# Patient Record
Sex: Male | Born: 1950 | Race: White | Hispanic: No | Smoking: Never smoker
Health system: Southern US, Community
[De-identification: ages and names within clinical notes are randomized; demographics above are authoritative.]

## PROBLEM LIST (undated history)

## (undated) DIAGNOSIS — Z9981 Dependence on supplemental oxygen: Secondary | ICD-10-CM

## (undated) DIAGNOSIS — I10 Essential (primary) hypertension: Secondary | ICD-10-CM

## (undated) DIAGNOSIS — I2510 Atherosclerotic heart disease of native coronary artery without angina pectoris: Secondary | ICD-10-CM

## (undated) DIAGNOSIS — E785 Hyperlipidemia, unspecified: Secondary | ICD-10-CM

## (undated) DIAGNOSIS — J42 Unspecified chronic bronchitis: Secondary | ICD-10-CM

## (undated) DIAGNOSIS — G4733 Obstructive sleep apnea (adult) (pediatric): Secondary | ICD-10-CM

## (undated) DIAGNOSIS — E669 Obesity, unspecified: Secondary | ICD-10-CM

## (undated) DIAGNOSIS — I214 Non-ST elevation (NSTEMI) myocardial infarction: Secondary | ICD-10-CM

## (undated) DIAGNOSIS — Z8739 Personal history of other diseases of the musculoskeletal system and connective tissue: Secondary | ICD-10-CM

## (undated) DIAGNOSIS — N189 Chronic kidney disease, unspecified: Secondary | ICD-10-CM

## (undated) DIAGNOSIS — L4050 Arthropathic psoriasis, unspecified: Secondary | ICD-10-CM

## (undated) DIAGNOSIS — M1990 Unspecified osteoarthritis, unspecified site: Secondary | ICD-10-CM

## (undated) DIAGNOSIS — E119 Type 2 diabetes mellitus without complications: Secondary | ICD-10-CM

## (undated) DIAGNOSIS — Z87442 Personal history of urinary calculi: Secondary | ICD-10-CM

## (undated) DIAGNOSIS — J45909 Unspecified asthma, uncomplicated: Secondary | ICD-10-CM

## (undated) DIAGNOSIS — Z9289 Personal history of other medical treatment: Secondary | ICD-10-CM

## (undated) DIAGNOSIS — Z9989 Dependence on other enabling machines and devices: Secondary | ICD-10-CM

## (undated) DIAGNOSIS — D649 Anemia, unspecified: Secondary | ICD-10-CM

## (undated) DIAGNOSIS — Z992 Dependence on renal dialysis: Secondary | ICD-10-CM

## (undated) DIAGNOSIS — N179 Acute kidney failure, unspecified: Secondary | ICD-10-CM

## (undated) HISTORY — DX: Hyperlipidemia, unspecified: E78.5

## (undated) HISTORY — DX: Essential (primary) hypertension: I10

## (undated) HISTORY — DX: Obesity, unspecified: E66.9

## (undated) HISTORY — DX: Atherosclerotic heart disease of native coronary artery without angina pectoris: I25.10

## (undated) HISTORY — PX: AV FISTULA PLACEMENT: SHX1204

## (undated) HISTORY — DX: Unspecified osteoarthritis, unspecified site: M19.90

## (undated) HISTORY — DX: Unspecified asthma, uncomplicated: J45.909

---

## 2000-01-21 ENCOUNTER — Encounter: Admission: RE | Admit: 2000-01-21 | Discharge: 2000-01-21 | Payer: Self-pay | Admitting: Orthopedic Surgery

## 2000-01-21 ENCOUNTER — Encounter: Payer: Self-pay | Admitting: Orthopedic Surgery

## 2004-06-24 ENCOUNTER — Ambulatory Visit: Payer: Self-pay | Admitting: Family Medicine

## 2004-09-09 ENCOUNTER — Ambulatory Visit: Payer: Self-pay | Admitting: Family Medicine

## 2004-11-15 ENCOUNTER — Ambulatory Visit: Payer: Self-pay | Admitting: Family Medicine

## 2004-11-22 ENCOUNTER — Ambulatory Visit: Payer: Self-pay | Admitting: Family Medicine

## 2005-01-25 ENCOUNTER — Ambulatory Visit: Payer: Self-pay | Admitting: Family Medicine

## 2006-04-17 ENCOUNTER — Ambulatory Visit: Payer: Self-pay | Admitting: Family Medicine

## 2006-06-18 ENCOUNTER — Ambulatory Visit: Payer: Self-pay | Admitting: Family Medicine

## 2006-07-19 ENCOUNTER — Ambulatory Visit: Payer: Self-pay | Admitting: Family Medicine

## 2006-07-19 LAB — CONVERTED CEMR LAB
Chol/HDL Ratio, serum: 3.9
Cholesterol: 128 mg/dL (ref 0–200)
Glucose, Bld: 181 mg/dL — ABNORMAL HIGH (ref 70–99)
HDL: 32.9 mg/dL — ABNORMAL LOW (ref >39.0)
Hgb A1c MFr Bld: 7 % — ABNORMAL HIGH (ref 4.6–6.0)
LDL Cholesterol: 70 mg/dL (ref 0–99)
Triglyceride fasting, serum: 126 mg/dL (ref 0–149)
VLDL: 25 mg/dL (ref 0–40)

## 2006-07-26 ENCOUNTER — Ambulatory Visit: Payer: Self-pay | Admitting: Family Medicine

## 2006-09-03 ENCOUNTER — Ambulatory Visit: Payer: Self-pay | Admitting: Family Medicine

## 2007-02-06 ENCOUNTER — Ambulatory Visit: Payer: Self-pay | Admitting: Family Medicine

## 2007-02-06 LAB — CONVERTED CEMR LAB
BUN: 23 mg/dL (ref 6–23)
CO2: 29 meq/L (ref 19–32)
Calcium: 9.6 mg/dL (ref 8.4–10.5)
Chloride: 107 meq/L (ref 96–112)
Creatinine, Ser: 1.2 mg/dL (ref 0.4–1.5)
GFR calc Af Amer: 81 mL/min
GFR calc non Af Amer: 67 mL/min
Glucose, Bld: 260 mg/dL — ABNORMAL HIGH (ref 70–99)
Hgb A1c MFr Bld: 8.2 % — ABNORMAL HIGH (ref 4.6–6.0)
Potassium: 6.2 meq/L (ref 3.5–5.1)
Sodium: 140 meq/L (ref 135–145)

## 2007-02-21 ENCOUNTER — Encounter: Payer: Self-pay | Admitting: Family Medicine

## 2007-02-21 DIAGNOSIS — I119 Hypertensive heart disease without heart failure: Secondary | ICD-10-CM | POA: Insufficient documentation

## 2007-02-21 DIAGNOSIS — K8020 Calculus of gallbladder without cholecystitis without obstruction: Secondary | ICD-10-CM | POA: Insufficient documentation

## 2007-02-21 DIAGNOSIS — I10 Essential (primary) hypertension: Secondary | ICD-10-CM

## 2007-02-21 DIAGNOSIS — Z87442 Personal history of urinary calculi: Secondary | ICD-10-CM | POA: Insufficient documentation

## 2007-05-07 ENCOUNTER — Ambulatory Visit: Payer: Self-pay | Admitting: Family Medicine

## 2007-05-07 LAB — CONVERTED CEMR LAB
BUN: 24 mg/dL — ABNORMAL HIGH (ref 6–23)
CO2: 30 meq/L (ref 19–32)
Calcium: 9.7 mg/dL (ref 8.4–10.5)
Chloride: 107 meq/L (ref 96–112)
Creatinine, Ser: 1.3 mg/dL (ref 0.4–1.5)
GFR calc Af Amer: 74 mL/min
GFR calc non Af Amer: 61 mL/min
Glucose, Bld: 273 mg/dL — ABNORMAL HIGH (ref 70–99)
Hgb A1c MFr Bld: 8.9 % — ABNORMAL HIGH (ref 4.6–6.0)
Potassium: 6.5 meq/L (ref 3.5–5.1)
Sodium: 141 meq/L (ref 135–145)

## 2007-05-09 ENCOUNTER — Telehealth: Payer: Self-pay | Admitting: Family Medicine

## 2007-05-14 ENCOUNTER — Ambulatory Visit: Payer: Self-pay | Admitting: Family Medicine

## 2007-05-14 ENCOUNTER — Telehealth: Payer: Self-pay | Admitting: Family Medicine

## 2007-05-14 LAB — CONVERTED CEMR LAB
BUN: 18 mg/dL (ref 6–23)
CO2: 28 meq/L (ref 19–32)
Calcium: 9.6 mg/dL (ref 8.4–10.5)
Chloride: 108 meq/L (ref 96–112)
Creatinine, Ser: 1.3 mg/dL (ref 0.4–1.5)
GFR calc Af Amer: 74 mL/min
GFR calc non Af Amer: 61 mL/min
Glucose, Bld: 245 mg/dL — ABNORMAL HIGH (ref 70–99)
Potassium: 5.9 meq/L — ABNORMAL HIGH (ref 3.5–5.1)
Sodium: 141 meq/L (ref 135–145)

## 2007-06-13 ENCOUNTER — Ambulatory Visit: Payer: Self-pay | Admitting: Family Medicine

## 2007-06-13 LAB — CONVERTED CEMR LAB
BUN: 21 mg/dL (ref 6–23)
CO2: 31 meq/L (ref 19–32)
Calcium: 9.5 mg/dL (ref 8.4–10.5)
Chloride: 105 meq/L (ref 96–112)
Creatinine, Ser: 1.1 mg/dL (ref 0.4–1.5)
GFR calc Af Amer: 90 mL/min
GFR calc non Af Amer: 74 mL/min
Glucose, Bld: 238 mg/dL — ABNORMAL HIGH (ref 70–99)
Potassium: 5.4 meq/L — ABNORMAL HIGH (ref 3.5–5.1)
Sodium: 141 meq/L (ref 135–145)

## 2007-07-16 ENCOUNTER — Ambulatory Visit: Payer: Self-pay | Admitting: Family Medicine

## 2007-07-16 LAB — CONVERTED CEMR LAB: Blood Glucose, Fingerstick: 185

## 2007-08-22 HISTORY — PX: CORONARY ANGIOPLASTY WITH STENT PLACEMENT: SHX49

## 2007-12-31 DIAGNOSIS — I2510 Atherosclerotic heart disease of native coronary artery without angina pectoris: Secondary | ICD-10-CM

## 2007-12-31 DIAGNOSIS — I259 Chronic ischemic heart disease, unspecified: Secondary | ICD-10-CM | POA: Insufficient documentation

## 2007-12-31 HISTORY — DX: Atherosclerotic heart disease of native coronary artery without angina pectoris: I25.10

## 2008-01-01 ENCOUNTER — Telehealth: Payer: Self-pay | Admitting: Family Medicine

## 2008-01-06 ENCOUNTER — Ambulatory Visit: Payer: Self-pay | Admitting: Family Medicine

## 2008-01-06 DIAGNOSIS — I2510 Atherosclerotic heart disease of native coronary artery without angina pectoris: Secondary | ICD-10-CM

## 2008-01-06 DIAGNOSIS — E785 Hyperlipidemia, unspecified: Secondary | ICD-10-CM | POA: Insufficient documentation

## 2008-01-06 LAB — CONVERTED CEMR LAB
BUN: 31 mg/dL — ABNORMAL HIGH (ref 6–23)
CO2: 29 meq/L (ref 19–32)
Calcium: 9.1 mg/dL (ref 8.4–10.5)
Chloride: 101 meq/L (ref 96–112)
Creatinine, Ser: 1.3 mg/dL (ref 0.4–1.5)
GFR calc Af Amer: 74 mL/min
GFR calc non Af Amer: 61 mL/min
Glucose, Bld: 317 mg/dL — ABNORMAL HIGH (ref 70–99)
Potassium: 4.6 meq/L (ref 3.5–5.1)
Sodium: 135 meq/L (ref 135–145)

## 2008-01-08 ENCOUNTER — Telehealth: Payer: Self-pay | Admitting: Family Medicine

## 2008-01-28 ENCOUNTER — Ambulatory Visit: Payer: Self-pay | Admitting: Family Medicine

## 2008-02-25 ENCOUNTER — Ambulatory Visit: Payer: Self-pay | Admitting: Family Medicine

## 2008-03-03 ENCOUNTER — Ambulatory Visit: Payer: Self-pay | Admitting: Family Medicine

## 2008-03-16 ENCOUNTER — Telehealth: Payer: Self-pay | Admitting: Family Medicine

## 2008-03-31 ENCOUNTER — Ambulatory Visit: Payer: Self-pay | Admitting: Family Medicine

## 2008-03-31 LAB — CONVERTED CEMR LAB
BUN: 23 mg/dL (ref 6–23)
CO2: 31 meq/L (ref 19–32)
Calcium: 9.7 mg/dL (ref 8.4–10.5)
Chloride: 100 meq/L (ref 96–112)
Creatinine, Ser: 1.2 mg/dL (ref 0.4–1.5)
Creatinine,U: 32.8 mg/dL
GFR calc Af Amer: 81 mL/min
GFR calc non Af Amer: 67 mL/min
Glucose, Bld: 237 mg/dL — ABNORMAL HIGH (ref 70–99)
Hgb A1c MFr Bld: 9.9 % — ABNORMAL HIGH (ref 4.6–6.0)
Microalb Creat Ratio: 146.3 mg/g — ABNORMAL HIGH (ref 0.0–30.0)
Microalb, Ur: 4.8 mg/dL — ABNORMAL HIGH (ref 0.0–1.9)
Potassium: 4.7 meq/L (ref 3.5–5.1)
Sodium: 138 meq/L (ref 135–145)

## 2008-04-28 ENCOUNTER — Ambulatory Visit: Payer: Self-pay | Admitting: Family Medicine

## 2008-05-27 ENCOUNTER — Telehealth: Payer: Self-pay | Admitting: *Deleted

## 2008-06-08 ENCOUNTER — Telehealth: Payer: Self-pay | Admitting: Family Medicine

## 2008-07-28 ENCOUNTER — Ambulatory Visit: Payer: Self-pay | Admitting: Family Medicine

## 2008-07-28 DIAGNOSIS — M1990 Unspecified osteoarthritis, unspecified site: Secondary | ICD-10-CM | POA: Insufficient documentation

## 2008-08-04 ENCOUNTER — Telehealth: Payer: Self-pay | Admitting: *Deleted

## 2008-08-05 LAB — CONVERTED CEMR LAB
BUN: 38 mg/dL — ABNORMAL HIGH (ref 6–23)
CO2: 32 meq/L (ref 19–32)
Calcium: 9.4 mg/dL (ref 8.4–10.5)
Chloride: 99 meq/L (ref 96–112)
Creatinine, Ser: 1.5 mg/dL (ref 0.4–1.5)
GFR calc Af Amer: 62 mL/min
GFR calc non Af Amer: 52 mL/min
Glucose, Bld: 232 mg/dL — ABNORMAL HIGH (ref 70–99)
Hgb A1c MFr Bld: 9.7 % — ABNORMAL HIGH (ref 4.6–6.0)
Potassium: 4.8 meq/L (ref 3.5–5.1)
Sodium: 138 meq/L (ref 135–145)

## 2008-10-08 ENCOUNTER — Ambulatory Visit: Payer: Self-pay | Admitting: Family Medicine

## 2008-10-08 DIAGNOSIS — J45901 Unspecified asthma with (acute) exacerbation: Secondary | ICD-10-CM | POA: Insufficient documentation

## 2008-10-12 ENCOUNTER — Telehealth: Payer: Self-pay | Admitting: Family Medicine

## 2008-12-10 ENCOUNTER — Ambulatory Visit: Payer: Self-pay | Admitting: Family Medicine

## 2008-12-31 ENCOUNTER — Ambulatory Visit: Payer: Self-pay | Admitting: Family Medicine

## 2008-12-31 DIAGNOSIS — R609 Edema, unspecified: Secondary | ICD-10-CM

## 2008-12-31 LAB — CONVERTED CEMR LAB
Cholesterol, target level: 200 mg/dL
HDL goal, serum: 40 mg/dL
LDL Goal: 70 mg/dL

## 2009-01-28 ENCOUNTER — Telehealth: Payer: Self-pay | Admitting: Family Medicine

## 2009-03-03 ENCOUNTER — Ambulatory Visit: Payer: Self-pay | Admitting: Family Medicine

## 2009-03-03 LAB — CONVERTED CEMR LAB
BUN: 36 mg/dL — ABNORMAL HIGH (ref 6–23)
CO2: 33 meq/L — ABNORMAL HIGH (ref 19–32)
Calcium: 9.3 mg/dL (ref 8.4–10.5)
Creatinine, Ser: 1.6 mg/dL — ABNORMAL HIGH (ref 0.4–1.5)
Glucose, Bld: 164 mg/dL — ABNORMAL HIGH (ref 70–99)

## 2009-03-04 ENCOUNTER — Telehealth: Payer: Self-pay | Admitting: Family Medicine

## 2009-03-16 ENCOUNTER — Encounter: Payer: Self-pay | Admitting: Family Medicine

## 2009-03-17 ENCOUNTER — Ambulatory Visit: Payer: Self-pay | Admitting: Family Medicine

## 2009-03-31 ENCOUNTER — Ambulatory Visit: Payer: Self-pay | Admitting: Family Medicine

## 2009-04-05 LAB — CONVERTED CEMR LAB
BUN: 57 mg/dL — ABNORMAL HIGH (ref 6–23)
Calcium: 9.3 mg/dL (ref 8.4–10.5)
GFR calc non Af Amer: 41.64 mL/min (ref >60)
Glucose, Bld: 161 mg/dL — ABNORMAL HIGH (ref 70–99)

## 2009-04-12 ENCOUNTER — Ambulatory Visit: Payer: Self-pay | Admitting: Family Medicine

## 2009-04-12 DIAGNOSIS — N184 Chronic kidney disease, stage 4 (severe): Secondary | ICD-10-CM

## 2009-04-12 LAB — CONVERTED CEMR LAB
CO2: 30 meq/L (ref 19–32)
Calcium: 9 mg/dL (ref 8.4–10.5)
Creatinine, Ser: 1.6 mg/dL — ABNORMAL HIGH (ref 0.4–1.5)

## 2009-04-15 ENCOUNTER — Telehealth: Payer: Self-pay | Admitting: Family Medicine

## 2009-05-26 ENCOUNTER — Ambulatory Visit: Payer: Self-pay | Admitting: Family Medicine

## 2009-05-26 LAB — CONVERTED CEMR LAB
BUN: 37 mg/dL — ABNORMAL HIGH (ref 6–23)
GFR calc non Af Amer: 51.36 mL/min (ref >60)
Hgb A1c MFr Bld: 6.9 % — ABNORMAL HIGH (ref 4.6–6.5)
Microalb, Ur: 0.9 mg/dL (ref 0.0–1.9)
Potassium: 6.1 meq/L (ref 3.5–5.1)
Sodium: 138 meq/L (ref 135–145)

## 2009-05-27 ENCOUNTER — Telehealth: Payer: Self-pay | Admitting: Family Medicine

## 2009-06-01 ENCOUNTER — Ambulatory Visit: Payer: Self-pay | Admitting: Family Medicine

## 2009-06-01 LAB — CONVERTED CEMR LAB
BUN: 37 mg/dL — ABNORMAL HIGH (ref 6–23)
CO2: 21 meq/L (ref 19–32)
Calcium: 9.3 mg/dL (ref 8.4–10.5)
Creatinine, Ser: 1.6 mg/dL — ABNORMAL HIGH (ref 0.4–1.5)

## 2009-06-04 ENCOUNTER — Telehealth: Payer: Self-pay | Admitting: Family Medicine

## 2009-06-16 ENCOUNTER — Telehealth: Payer: Self-pay | Admitting: Family Medicine

## 2009-06-21 ENCOUNTER — Ambulatory Visit: Payer: Self-pay | Admitting: Family Medicine

## 2009-06-23 LAB — CONVERTED CEMR LAB
BUN: 25 mg/dL — ABNORMAL HIGH (ref 6–23)
Calcium: 8.5 mg/dL (ref 8.4–10.5)
Creatinine, Ser: 1.4 mg/dL (ref 0.4–1.5)
GFR calc non Af Amer: 55.6 mL/min (ref >60)
Potassium: 4.8 meq/L (ref 3.5–5.1)

## 2009-07-02 ENCOUNTER — Telehealth: Payer: Self-pay | Admitting: Family Medicine

## 2009-07-19 ENCOUNTER — Ambulatory Visit: Payer: Self-pay | Admitting: Family Medicine

## 2009-07-20 DIAGNOSIS — M159 Polyosteoarthritis, unspecified: Secondary | ICD-10-CM | POA: Insufficient documentation

## 2009-07-29 ENCOUNTER — Telehealth: Payer: Self-pay | Admitting: Family Medicine

## 2009-08-16 ENCOUNTER — Telehealth: Payer: Self-pay | Admitting: Family Medicine

## 2009-08-19 ENCOUNTER — Telehealth: Payer: Self-pay | Admitting: Family Medicine

## 2009-08-19 ENCOUNTER — Ambulatory Visit: Payer: Self-pay | Admitting: Family Medicine

## 2009-08-30 ENCOUNTER — Encounter: Payer: Self-pay | Admitting: Family Medicine

## 2009-09-06 ENCOUNTER — Ambulatory Visit: Payer: Self-pay | Admitting: Family Medicine

## 2009-09-06 LAB — CONVERTED CEMR LAB
CO2: 24 meq/L (ref 19–32)
Calcium: 9.3 mg/dL (ref 8.4–10.5)
GFR calc non Af Amer: 147.7 mL/min (ref >60)
Hgb A1c MFr Bld: 6.5 % (ref 4.6–6.5)
Sodium: 144 meq/L (ref 135–145)

## 2009-09-07 ENCOUNTER — Telehealth: Payer: Self-pay | Admitting: Family Medicine

## 2009-09-28 ENCOUNTER — Telehealth: Payer: Self-pay | Admitting: Family Medicine

## 2009-10-11 ENCOUNTER — Encounter: Payer: Self-pay | Admitting: Family Medicine

## 2009-11-15 ENCOUNTER — Ambulatory Visit: Payer: Self-pay | Admitting: Family Medicine

## 2009-11-17 ENCOUNTER — Telehealth: Payer: Self-pay | Admitting: Family Medicine

## 2009-12-09 ENCOUNTER — Ambulatory Visit: Payer: Self-pay | Admitting: Family Medicine

## 2009-12-23 ENCOUNTER — Ambulatory Visit: Payer: Self-pay | Admitting: Family Medicine

## 2009-12-23 LAB — CONVERTED CEMR LAB
BUN: 36 mg/dL — ABNORMAL HIGH (ref 6–23)
CO2: 28 meq/L (ref 19–32)
Calcium: 9.3 mg/dL (ref 8.4–10.5)
Cholesterol: 311 mg/dL — ABNORMAL HIGH (ref 0–200)
GFR calc non Af Amer: 50.09 mL/min (ref >60)
Glucose, Bld: 291 mg/dL — ABNORMAL HIGH (ref 70–99)
Triglycerides: 784 mg/dL — ABNORMAL HIGH (ref 0.0–149.0)

## 2009-12-24 ENCOUNTER — Telehealth: Payer: Self-pay | Admitting: Family Medicine

## 2010-01-05 ENCOUNTER — Telehealth: Payer: Self-pay | Admitting: *Deleted

## 2010-01-06 ENCOUNTER — Ambulatory Visit: Payer: Self-pay | Admitting: Family Medicine

## 2010-01-06 DIAGNOSIS — M549 Dorsalgia, unspecified: Secondary | ICD-10-CM

## 2010-01-07 LAB — CONVERTED CEMR LAB
CO2: 28 meq/L (ref 19–32)
Calcium: 9.2 mg/dL (ref 8.4–10.5)
Chloride: 105 meq/L (ref 96–112)
Creatinine, Ser: 1.5 mg/dL (ref 0.4–1.5)
Glucose, Bld: 216 mg/dL — ABNORMAL HIGH (ref 70–99)

## 2010-01-14 ENCOUNTER — Ambulatory Visit: Payer: Self-pay | Admitting: Family Medicine

## 2010-01-14 DIAGNOSIS — L02419 Cutaneous abscess of limb, unspecified: Secondary | ICD-10-CM | POA: Insufficient documentation

## 2010-01-14 DIAGNOSIS — L03119 Cellulitis of unspecified part of limb: Secondary | ICD-10-CM

## 2010-01-20 ENCOUNTER — Telehealth: Payer: Self-pay | Admitting: *Deleted

## 2010-01-20 ENCOUNTER — Ambulatory Visit: Payer: Self-pay | Admitting: Family Medicine

## 2010-02-22 ENCOUNTER — Ambulatory Visit: Payer: Self-pay | Admitting: Family Medicine

## 2010-02-22 ENCOUNTER — Telehealth: Payer: Self-pay | Admitting: Family Medicine

## 2010-02-22 LAB — CONVERTED CEMR LAB
BUN: 66 mg/dL — ABNORMAL HIGH (ref 6–23)
Chloride: 107 meq/L (ref 96–112)
Creatinine, Ser: 2.4 mg/dL — ABNORMAL HIGH (ref 0.4–1.5)
GFR calc non Af Amer: 30.51 mL/min (ref >60)
Glucose, Bld: 165 mg/dL — ABNORMAL HIGH (ref 70–99)

## 2010-03-09 ENCOUNTER — Encounter: Payer: Self-pay | Admitting: Family Medicine

## 2010-04-18 ENCOUNTER — Ambulatory Visit: Payer: Self-pay | Admitting: Family Medicine

## 2010-04-18 DIAGNOSIS — M545 Low back pain: Secondary | ICD-10-CM

## 2010-04-18 LAB — CONVERTED CEMR LAB
Glucose, Urine, Semiquant: NEGATIVE
Nitrite: NEGATIVE
Protein, U semiquant: NEGATIVE
WBC Urine, dipstick: NEGATIVE
pH: 6

## 2010-04-20 ENCOUNTER — Telehealth: Payer: Self-pay | Admitting: Family Medicine

## 2010-04-29 ENCOUNTER — Telehealth: Payer: Self-pay | Admitting: Family Medicine

## 2010-05-05 ENCOUNTER — Ambulatory Visit: Payer: Self-pay | Admitting: Family Medicine

## 2010-05-17 ENCOUNTER — Ambulatory Visit: Payer: Self-pay | Admitting: Family Medicine

## 2010-05-18 ENCOUNTER — Telehealth: Payer: Self-pay | Admitting: Family Medicine

## 2010-05-18 LAB — CONVERTED CEMR LAB: Potassium: 5.2 meq/L — ABNORMAL HIGH (ref 3.5–5.1)

## 2010-06-06 ENCOUNTER — Telehealth: Payer: Self-pay | Admitting: Family Medicine

## 2010-07-05 ENCOUNTER — Ambulatory Visit: Payer: Self-pay | Admitting: Family Medicine

## 2010-07-05 DIAGNOSIS — M109 Gout, unspecified: Secondary | ICD-10-CM

## 2010-07-27 ENCOUNTER — Ambulatory Visit: Payer: Self-pay | Admitting: Family Medicine

## 2010-07-27 DIAGNOSIS — R319 Hematuria, unspecified: Secondary | ICD-10-CM

## 2010-07-27 LAB — CONVERTED CEMR LAB
BUN: 39 mg/dL — ABNORMAL HIGH (ref 6–23)
Creatinine, Ser: 1.6 mg/dL — ABNORMAL HIGH (ref 0.4–1.5)
GFR calc non Af Amer: 48.88 mL/min — ABNORMAL LOW (ref >60.00)
Hgb A1c MFr Bld: 7 % — ABNORMAL HIGH (ref 4.6–6.5)
Ketones, urine, test strip: NEGATIVE
Nitrite: NEGATIVE
Specific Gravity, Urine: 1.02
WBC Urine, dipstick: NEGATIVE
pH: 5

## 2010-07-28 ENCOUNTER — Telehealth: Payer: Self-pay | Admitting: Family Medicine

## 2010-08-03 ENCOUNTER — Ambulatory Visit: Payer: Self-pay | Admitting: Family Medicine

## 2010-08-16 ENCOUNTER — Ambulatory Visit
Admission: RE | Admit: 2010-08-16 | Discharge: 2010-08-16 | Payer: Self-pay | Source: Home / Self Care | Attending: Internal Medicine | Admitting: Internal Medicine

## 2010-08-16 DIAGNOSIS — J069 Acute upper respiratory infection, unspecified: Secondary | ICD-10-CM | POA: Insufficient documentation

## 2010-08-19 ENCOUNTER — Encounter: Payer: Self-pay | Admitting: Family Medicine

## 2010-09-18 LAB — CONVERTED CEMR LAB
ALT: 45 units/L (ref 0–53)
AST: 43 units/L — ABNORMAL HIGH (ref 0–37)
Albumin: 4.2 g/dL (ref 3.5–5.2)
Alkaline Phosphatase: 56 units/L (ref 39–117)
Basophils Absolute: 0 10*3/uL (ref 0.0–0.1)
Basophils Relative: 0.3 % (ref 0.0–3.0)
Blood in Urine, dipstick: NEGATIVE
Calcium: 9.1 mg/dL (ref 8.4–10.5)
Creatinine,U: 27.2 mg/dL
Direct LDL: 80.7 mg/dL
Eosinophils Relative: 1.8 % (ref 0.0–5.0)
GFR calc non Af Amer: 44 mL/min (ref >60)
Glucose, Bld: 129 mg/dL — ABNORMAL HIGH (ref 70–99)
HCT: 36.9 % — ABNORMAL LOW (ref 39.0–52.0)
HDL: 36.1 mg/dL — ABNORMAL LOW (ref >39.00)
Hemoglobin: 12.8 g/dL — ABNORMAL LOW (ref 13.0–17.0)
Ketones, urine, test strip: NEGATIVE
Lymphocytes Relative: 26.3 % (ref 12.0–46.0)
Lymphs Abs: 1.5 10*3/uL (ref 0.7–4.0)
Microalb Creat Ratio: 9.9 mg/g (ref 0.0–30.0)
Monocytes Relative: 7.2 % (ref 3.0–12.0)
Neutro Abs: 3.8 10*3/uL (ref 1.4–7.7)
Nitrite: NEGATIVE
Potassium: 6.1 meq/L (ref 3.5–5.1)
RBC: 4.05 M/uL — ABNORMAL LOW (ref 4.22–5.81)
RDW: 13 % (ref 11.5–14.6)
Sodium: 143 meq/L (ref 135–145)
Specific Gravity, Urine: 1.01
TSH: 2.1 microintl units/mL (ref 0.35–5.50)
Total CHOL/HDL Ratio: 4
Triglycerides: 269 mg/dL — ABNORMAL HIGH (ref 0.0–149.0)
Urobilinogen, UA: 0.2
VLDL: 53.8 mg/dL — ABNORMAL HIGH (ref 0.0–40.0)
WBC: 5.9 10*3/uL (ref 4.5–10.5)

## 2010-09-19 ENCOUNTER — Telehealth: Payer: Self-pay | Admitting: Family Medicine

## 2010-09-20 NOTE — Progress Notes (Signed)
Summary: novolin samples  Phone Note Outgoing Call   Summary of Call: samples of novolin are in office for patient to pick up Initial call taken by: Westley Hummer CMA Deborra Medina),  Jan 05, 2010 9:20 AM  Follow-up for Phone Call        Phone call completed Follow-up by: Westley Hummer CMA Deborra Medina),  Jan 05, 2010 9:20 AM

## 2010-09-20 NOTE — Assessment & Plan Note (Signed)
Summary: cough/chest congestion/cjr   Vital Signs:  Patient profile:   60 year old male Height:      68 inches Weight:      292 pounds BMI:     44.56 Temp:     98.1 degrees F oral BP sitting:   140 / 80  (left arm) Cuff size:   large  Vitals Entered By: Westley Hummer CMA Deborra Medina) (November 15, 2009 3:20 PM) CC: chest cough Is Patient Diabetic? Yes   CC:  chest cough.  History of Present Illness: Sovereign is a 60 year old single male, nonsmoker, with underlying type 1 diabetes, who comes in with a 10 day history of wheezing.  10 days ago he developed a viral syndrome with fever and chills.  That went away, but the wheezing that started about a week ago.  He's had a history of asthma in the past.  Blood sugar at home 120 to 130 and 100 units of Lantus daily, plus sliding scale regular  Allergies: No Known Drug Allergies  Past History:  Past medical, surgical, family and social histories (including risk factors) reviewed for relevance to current acute and chronic problems.  Past Medical History: Reviewed history from 10/08/2008 and no changes required. Hypertension OBESE Diabetes mellitus, type II Nephrolithiasis, hx of Coronary artery disease Dec 31, 2007 admitted to [facility] at stents placed in the circumflex and his LAD Hyperlipidemia Diabetes mellitus, type I Osteoarthritis Asthma  Past Surgical History: Reviewed history from 02/21/2007 and no changes required. Ashok Croon  Family History: Reviewed history from 01/06/2008 and no changes required. Family History Diabetes 1st degree relative Family History High cholesterol Family History Hypertension  Social History: Reviewed history from 05/26/2009 and no changes required. Single Never Smoked Alcohol use-no Regular exercise-no Retired..........disabled because of underlying hypertension, diabetes, renal insufficiency, and coronary artery disease  Review of Systems      See HPI  Physical  Exam  General:  Well-developed,well-nourished,in no acute distress; alert,appropriate and cooperative throughout examination Head:  Normocephalic and atraumatic without obvious abnormalities. No apparent alopecia or balding. Eyes:  No corneal or conjunctival inflammation noted. EOMI. Perrla. Funduscopic exam benign, without hemorrhages, exudates or papilledema. Vision grossly normal. Ears:  External ear exam shows no significant lesions or deformities.  Otoscopic examination reveals clear canals, tympanic membranes are intact bilaterally without bulging, retraction, inflammation or discharge. Hearing is grossly normal bilaterally. Nose:  External nasal examination shows no deformity or inflammation. Nasal mucosa are pink and moist without lesions or exudates. Mouth:  Oral mucosa and oropharynx without lesions or exudates.  Teeth in good repair. Neck:  No deformities, masses, or tenderness noted. Chest Wall:  No deformities, masses, tenderness or gynecomastia noted. Lungs:  inspiratory and expiratory wheezing   Impression & Recommendations:  Problem # 1:  ASTHMA (ICD-493.90) Assessment Deteriorated  His updated medication list for this problem includes:    Prednisone 20 Mg Tabs (Prednisone) ..... Uad  Orders: Prescription Created Electronically [phone])  Complete Medication List: 1)  Flexeril 10 Mg Tabs (Cyclobenzaprine hcl) 2)  Vicodin Es 7.5-750 Mg Tabs (Hydrocodone-acetaminophen) .... Take 1 tablet by mouth three times a day 3)  Glucotrol Xl 10 Mg Tb24 (Glipizide) .... Two times a day 4)  Glucophage 1000 Mg Tabs (Metformin hcl) .... Two times a day 5)  Simvastatin 40 Mg Tabs (Simvastatin) .... Once daily 6)  Adult Aspirin Ec Low Strength 81 Mg Tbec (Aspirin) .... Once daily 7)  Cetp Inhibitor  8)  Lantus 100 Unit/ml Soln (Insulin glargine) .Marland Kitchen.. 100  uqhs 9)  Bd Insulin Syringe Microfine 28g X 1/2" 1 Ml Misc (Insulin syringe-needle u-100) .... 65u at bedtime 10)  Furosemide 40 Mg  Tabs (Furosemide) .... Take one tab once daily 11)  Humalog 100 Unit/ml Soln (Insulin lispro (human)) .... 30 units once daily  using sliding scale 12)  Labetalol Hcl 300 Mg Tabs (Labetalol hcl) .... 2 by mouth two times a day 13)  Benicar Hct 40-12.5 Mg Tabs (Olmesartan medoxomil-hctz) .... Take one tab once daily 14)  Effient 10 Mg Tabs (Prasugrel hcl) .... Take one tab once daily 15)  Prednisone 20 Mg Tabs (Prednisone) .... Uad 16)  Hydromet 5-1.5 Mg/37ml Syrp (Hydrocodone-homatropine) .... 1/2 to 1 tsp at bedtime  Patient Instructions: 1)  drain to 30 ounces of water daily, begin prednisone two tabs x 3 days, one x 3 days, a half x 3 days, then half a tablet Monday, Wednesday, Friday, for a two week taper also, Hydromet one half to 1 teaspoon at bedtime as needed for nighttime cough Prescriptions: PREDNISONE 20 MG TABS (PREDNISONE) UAD  #30 x 1   Entered and Authorized by:   Dorena Cookey MD   Signed by:   Dorena Cookey MD on 11/15/2009   Method used:   Electronically to        [facility] (retail)       [place]  [address]       Ph: [phone]       Fax: [phone]   [id number] HYDROMET 5-1.5 MG/5ML SYRP (HYDROCODONE-HOMATROPINE) 1/2 to 1 tsp at bedtime  #4oz x 1   Entered and Authorized by:   Dorena Cookey MD   Signed by:   Dorena Cookey MD on 11/15/2009   Method used:   Print then Give to Patient   RxID:   [id number] PREDNISONE 20 MG TABS (PREDNISONE) UAD  #30 x 1   Entered and Authorized by:   Dorena Cookey MD   Signed by:   Dorena Cookey MD on 11/15/2009   Method used:   Electronically to        [facility] [place] (retail)       [address]       [place]  [address]       Ph: [phone]       Fax: [phone]   RxID:   [id number]

## 2010-09-20 NOTE — Assessment & Plan Note (Signed)
Summary: 1 mo rov/mm   Vital Signs:  Patient profile:   60 year old male Weight:      293 pounds Temp:     97.8 degrees F oral BP sitting:   124 / 84  (left arm) Cuff size:   regular  Vitals Entered By: Westley Hummer CMA Deborra Medina) (February 22, 2010 9:50 AM) CC: follow-up visit   CC:  follow-up visit.  History of Present Illness: Kent Osborne is a 60 year old type I diabetic who comes in today for follow-up and evaluation of an infected left great toenail.  His blood sugar is averaging about 100 fasting.  A1c, and kidney function today.  He recently had a cellulitis related to a bug bite, and it resolves with Keflex.  A couple days ago.  He was cutting his nails.  He trim them too short and then the next day noticed some redness in the medial side of his left great toenail.  It's been draining pus and he spent soaking it.  He had a refill on the Keflex and restarted that.  He states is not as red or sore as it was yesterday  Allergies: No Known Drug Allergies  Past History:  Past medical, surgical, family and social histories (including risk factors) reviewed for relevance to current acute and chronic problems.  Past Medical History: Reviewed history from 10/08/2008 and no changes required. Hypertension OBESE Diabetes mellitus, type II Nephrolithiasis, hx of Coronary artery disease Dec 31, 2007 admitted to [facility] at stents placed in the circumflex and his LAD Hyperlipidemia Diabetes mellitus, type I Osteoarthritis Asthma  Past Surgical History: Reviewed history from 02/21/2007 and no changes required. Ashok Croon  Family History: Reviewed history from 01/06/2008 and no changes required. Family History Diabetes 1st degree relative Family History High cholesterol Family History Hypertension  Social History: Reviewed history from 05/26/2009 and no changes required. Single Never Smoked Alcohol use-no Regular exercise-no Retired..........disabled because of  underlying hypertension, diabetes, renal insufficiency, and coronary artery disease  Review of Systems      See HPI  Physical Exam  General:  Well-developed,well-nourished,in no acute distress; alert,appropriate and cooperative throughout examination Skin:  the medial portion of the left great toenail shows some erythema.  No pus expressed.  There is some tenderness   Impression & Recommendations:  Problem # 1:  CELLULITIS, RIGHT LEG (ICD-682.6) Assessment Improved  His updated medication list for this problem includes:    Keflex 500 Mg Caps (Cephalexin) .Marland Kitchen... 2 by mouth two times a day  Problem # 2:  DIABETES MELLITUS, TYPE I (ICD-250.01) Assessment: Improved  His updated medication list for this problem includes:    Glucotrol Xl 10 Mg Tb24 (Glipizide) .Marland Kitchen..Marland Kitchen Two times a day    Glucophage 1000 Mg Tabs (Metformin hcl) .Marland Kitchen..Marland Kitchen Two times a day    Adult Aspirin Ec Low Strength 81 Mg Tbec (Aspirin) ..... Once daily    Lantus 100 Unit/ml Soln (Insulin glargine) .Marland KitchenMarland KitchenMarland KitchenMarland Kitchen 100  uqhs    Humalog 100 Unit/ml Soln (Insulin lispro (human)) .Marland KitchenMarland KitchenMarland KitchenMarland Kitchen 30 units once daily  using sliding scale    Benicar Hct 40-12.5 Mg Tabs (Olmesartan medoxomil-hctz) .Marland Kitchen... Take one tab once daily  Orders: Venipuncture [id number]) TLB-BMP (Basic Metabolic Panel-BMET) (99991111)  Complete Medication List: 1)  Flexeril 10 Mg Tabs (Cyclobenzaprine hcl) 2)  Glucotrol Xl 10 Mg Tb24 (Glipizide) .... Two times a day 3)  Glucophage 1000 Mg Tabs (Metformin hcl) .... Two times a day 4)  Simvastatin 40 Mg Tabs (Simvastatin) .... Once daily -  stopped- 5)  Adult Aspirin Ec Low Strength 81 Mg Tbec (Aspirin) .... Once daily 6)  Cetp Inhibitor  7)  Lantus 100 Unit/ml Soln (Insulin glargine) .Marland Kitchen.. 100  uqhs 8)  Bd Insulin Syringe Microfine 28g X 1/2" 1 Ml Misc (Insulin syringe-needle u-100) .... 65u at bedtime 9)  Furosemide 40 Mg Tabs (Furosemide) .... Take one tab once daily 10)  Humalog 100 Unit/ml Soln (Insulin lispro (human))  .... 30 units once daily  using sliding scale 11)  Labetalol Hcl 300 Mg Tabs (Labetalol hcl) .... 2 by mouth two times a day 12)  Benicar Hct 40-12.5 Mg Tabs (Olmesartan medoxomil-hctz) .... Take one tab once daily 13)  Effient 10 Mg Tabs (Prasugrel hcl) .... Take one tab once daily 14)  Vicodin Hp 10-660 Mg Tabs (Hydrocodone-acetaminophen) .... Take 1 tablet by mouth three times a day 15)  Keflex 500 Mg Caps (Cephalexin) .... 2 by mouth two times a day Prescriptions: KEFLEX 500 MG CAPS (CEPHALEXIN) 2 by mouth two times a day  #60 x 1   Entered and Authorized by:   Dorena Cookey MD   Signed by:   Dorena Cookey MD on 02/22/2010   Method used:   Print then Give to Patient   RxID:   [id number] VICODIN HP 10-660 MG TABS (HYDROCODONE-ACETAMINOPHEN) Take 1 tablet by mouth three times a day  #100 x 1   Entered and Authorized by:   Dorena Cookey MD   Signed by:   Dorena Cookey MD on 02/22/2010   Method used:   Print then Give to Patient   RxID:   [id number]

## 2010-09-20 NOTE — Progress Notes (Signed)
  Phone Note Outgoing Call   Summary of Call: significant improvement in GFR plus decrease of BUN and creatinine.  Continue current medications call in 3 weeks for BP.  Phone follow-up Initial call taken by: Dorena Cookey MD,  September 07, 2009 1:50 PM

## 2010-09-20 NOTE — Assessment & Plan Note (Signed)
Summary: 2 week fup//ccm   Vital Signs:  Patient profile:   60 year old male Weight:      296 pounds Temp:     98.4 degrees F oral BP sitting:   118 / 80  (left arm) Cuff size:   regular  Vitals Entered By: Westley Hummer CMA Deborra Medina) (January 20, 2010 9:06 AM) CC: follow-up visit   CC:  follow-up visit.  History of Present Illness: Kent Osborne is a 60 year old male, who comes in today for reevaluation of a cellulitis of his left posterior thigh.  We saw him a week ago with this problem.  We put him on hot soaks and Keflex 1 g b.i.d.  He comes back today for follow-up.  Allergies: No Known Drug Allergies  Review of Systems      See HPI  Physical Exam  General:  Well-developed,well-nourished,in no acute distress; alert,appropriate and cooperative throughout examination Skin:  the erythema has decreased however, there is an abscess deep within the muscle.   Impression & Recommendations:  Problem # 1:  CELLULITIS, RIGHT LEG (ICD-682.6) Assessment Unchanged  His updated medication list for this problem includes:    Keflex 500 Mg Caps (Cephalexin) .Marland Kitchen... 2 by mouth two times a day  Complete Medication List: 1)  Flexeril 10 Mg Tabs (Cyclobenzaprine hcl) 2)  Glucotrol Xl 10 Mg Tb24 (Glipizide) .... Two times a day 3)  Glucophage 1000 Mg Tabs (Metformin hcl) .... Two times a day 4)  Simvastatin 40 Mg Tabs (Simvastatin) .... Once daily - stopped- 5)  Adult Aspirin Ec Low Strength 81 Mg Tbec (Aspirin) .... Once daily 6)  Cetp Inhibitor  7)  Lantus 100 Unit/ml Soln (Insulin glargine) .Marland Kitchen.. 100  uqhs 8)  Bd Insulin Syringe Microfine 28g X 1/2" 1 Ml Misc (Insulin syringe-needle u-100) .... 65u at bedtime 9)  Furosemide 40 Mg Tabs (Furosemide) .... Take one tab once daily 10)  Humalog 100 Unit/ml Soln (Insulin lispro (human)) .... 30 units once daily  using sliding scale 11)  Labetalol Hcl 300 Mg Tabs (Labetalol hcl) .... 2 by mouth two times a day 12)  Benicar Hct 40-12.5 Mg Tabs  (Olmesartan medoxomil-hctz) .... Take one tab once daily 13)  Effient 10 Mg Tabs (Prasugrel hcl) .... Take one tab once daily 14)  Vicodin Hp 10-660 Mg Tabs (Hydrocodone-acetaminophen) .... Take 1 tablet by mouth three times a day 15)  Keflex 500 Mg Caps (Cephalexin) .... 2 by mouth two times a day  Patient Instructions: 1)  call your cardiologist, in [place], and have them recommend a general surgeon for you to see today

## 2010-09-20 NOTE — Progress Notes (Signed)
Summary: gout med  Phone Note Call from Patient Call back at Home Phone [phone]   Summary of Call: Please call me about med.  To ER, did labs, gout, took med 2 days a pil? every 2 hrs 6x a day,  went away, then came back, took rest of pills..Burning sensation toes right more but in both feet.  Swelling.  Warm to touch.  Lives [place], [place].  Hopes he doesn't have to come, says Dr. Darene Lamer sees him all the time.    Request callin med to [facility], in file.  NKDA.     Initial call taken by: Shelbie Hutching, RN,  June 06, 2010 9:54 AM  Follow-up for Phone Call        Apolonio Schneiders please call......Marland Kitchen we need to details of what happened, copy of all lab work, so we can make a decision Follow-up by: Dorena Cookey MD,  June 06, 2010 1:35 PM  Additional Follow-up for Phone Call Additional follow up Details #1::        spoke with patient and he will fax all papers Additional Follow-up by: Westley Hummer CMA Deborra Medina),  June 06, 2010 5:19 PM

## 2010-09-20 NOTE — Assessment & Plan Note (Signed)
Summary: ACUTE/BLOOD IN URINE/AS PER DR/RCD/a1c,bmp/njr   Vital Signs:  Patient profile:   60 year old male Height:      68 inches Weight:      308 pounds Temp:     99.2 degrees F oral Pulse rate:   75 / minute BP sitting:   152 / 84  (left arm) Cuff size:   regular  Vitals Entered By: Geanie Kenning LPN (December  7, 624THL 11:09 AM) CC: last night had blood in urine- no pain. Gave urine sample at lab this morning   CC:  last night had blood in urine- no pain. Gave urine sample at lab this morning.  History of Present Illness: Kent Osborne is a 60 y/o male with the sudden onset of hematuria yesterday.  He's had no fever, chills, back pain, or previous episodes of hematuria in the past.  He's currently taking two medications one is effient 10 mg daily and 6 aspirin tablets daily.  Review of systems otherwise negative  Current Medications (verified): 1)  Flexeril 10 Mg Tabs (Cyclobenzaprine Hcl) .... One Every 8 Hour As Needed 2)  Glucotrol Xl 10 Mg  Tb24 (Glipizide) .... Two Times A Day 3)  Glucophage 1000 Mg  Tabs (Metformin Hcl) .... Two Times A Day 4)  Simvastatin 40 Mg  Tabs (Simvastatin) .... Once Daily 5)  Adult Aspirin Ec Low Strength 81 Mg  Tbec (Aspirin) .... Once Daily 6)  Cetp Inhibitor 7)  Bd Insulin Syringe Microfine 28g X 1/2" 1 Ml Misc (Insulin Syringe-Needle U-100) .... 65u At Bedtime 8)  Furosemide 40 Mg Tabs (Furosemide) .... Take One Tab Every Other Day 9)  Humalog 100 Unit/ml Soln (Insulin Lispro (Human)) .Marland Kitchen.. 15 To 17 Units 3 Times A Day 10)  Labetalol Hcl 300 Mg Tabs (Labetalol Hcl) .... 2 By Mouth Two Times A Day 11)  Benicar Hct 40-12.5 Mg Tabs (Olmesartan Medoxomil-Hctz) .... Take 1/2 Tablet By Mouth in Themorning and 1/2 Tab in The Evening 12)  Effient 10 Mg Tabs (Prasugrel Hcl) .... Take One Tab Once Daily 13)  Vicodin Hp 10-660 Mg Tabs (Hydrocodone-Acetaminophen) .... Take 1 Tablet By Mouth Three Times A Day 14)  Lantus 100 Unit/ml Soln (Insulin Glargine) .... Use  100 Units At Bedtime 15)  Colcrys 0.6 Mg Tabs (Colchicine) .... Take 1 Tablet By Mouth Every Morning 16)  Allopurinol 300 Mg Tabs (Allopurinol) .... Take 1 Tablet By Mouth Every Morning  Allergies (verified): No Known Drug Allergies  Past History:  Past medical history reviewed for relevance to current acute and chronic problems. Past medical, surgical, family and social histories (including risk factors) reviewed for relevance to current acute and chronic problems.  Past Medical History: Reviewed history from 10/08/2008 and no changes required. Hypertension OBESE Diabetes mellitus, type II Nephrolithiasis, hx of Coronary artery disease Dec 31, 2007 admitted to [facility] at stents placed in the circumflex and his LAD Hyperlipidemia Diabetes mellitus, type I Osteoarthritis Asthma  Past Surgical History: Reviewed history from 02/21/2007 and no changes required. Ashok Croon  Family History: Reviewed history from 01/06/2008 and no changes required. Family History Diabetes 1st degree relative Family History High cholesterol Family History Hypertension  Social History: Reviewed history from 05/26/2009 and no changes required. Single Never Smoked Alcohol use-no Regular exercise-no Retired..........disabled because of underlying hypertension, diabetes, renal insufficiency, and coronary artery disease  Review of Systems      See HPI  Physical Exam  General:  Well-developed,well-nourished,in no acute distress; alert,appropriate and cooperative throughout examination  Problems:  Medical Problems Added: 1)  Dx of Hematuria  (ICD-599.70)  Impression & Recommendations:  Problem # 1:  HEMATURIA (ICD-599.70) Assessment New  Complete Medication List: 1)  Flexeril 10 Mg Tabs (Cyclobenzaprine hcl) .... One every 8 hour as needed 2)  Glucotrol Xl 10 Mg Tb24 (Glipizide) .... Two times a day 3)  Glucophage 1000 Mg Tabs (Metformin hcl) .... Two times a day 4)   Simvastatin 40 Mg Tabs (Simvastatin) .... Once daily 5)  Adult Aspirin Ec Low Strength 81 Mg Tbec (Aspirin) .... Once daily 6)  Cetp Inhibitor  7)  Bd Insulin Syringe Microfine 28g X 1/2" 1 Ml Misc (Insulin syringe-needle u-100) .... 65u at bedtime 8)  Furosemide 40 Mg Tabs (Furosemide) .... Take one tab every other day 9)  Humalog 100 Unit/ml Soln (Insulin lispro (human)) .Marland Kitchen.. 15 to 17 units 3 times a day 10)  Labetalol Hcl 300 Mg Tabs (Labetalol hcl) .... 2 by mouth two times a day 11)  Benicar Hct 40-12.5 Mg Tabs (Olmesartan medoxomil-hctz) .... Take 1/2 tablet by mouth in themorning and 1/2 tab in the evening 12)  Effient 10 Mg Tabs (Prasugrel hcl) .... Take one tab once daily 13)  Vicodin Hp 10-660 Mg Tabs (Hydrocodone-acetaminophen) .... Take 1 tablet by mouth three times a day 14)  Lantus 100 Unit/ml Soln (Insulin glargine) .... Use 100 units at bedtime 15)  Colcrys 0.6 Mg Tabs (Colchicine) .... Take 1 tablet by mouth every morning 16)  Allopurinol 300 Mg Tabs (Allopurinol) .... Take 1 tablet by mouth every morning  Other Orders: Venipuncture [id number]) TLB-BMP (Basic Metabolic Panel-BMET) (99991111) TLB-A1C / Hgb A1C (Glycohemoglobin) (83036-A1C) UA Dipstick w/o Micro (automated)  (81003) Specimen Handling (99000)  Patient Instructions: 1)  I would recommend he stop all aspirin and the effient .for 3 days and then restart the  medication except only take one aspirin tablet daily 2)  Discuss this with your cardiologist, in [place], today, to be sure it that their okay with this.   Orders Added: 1)  Venipuncture [id number] 2)  TLB-BMP (Basic Metabolic Panel-BMET) 123456 3)  TLB-A1C / Hgb A1C (Glycohemoglobin) [83036-A1C] 4)  UA Dipstick w/o Micro (automated)  [81003] 5)  Specimen Handling [99000] 6)  Est. Patient Level III [id number]    Laboratory Results   Urine Tests    Routine Urinalysis   Color: yellow Appearance: Clear Glucose: trace   (Normal Range:  Negative) Bilirubin: negative   (Normal Range: Negative) Ketone: negative   (Normal Range: Negative) Spec. Gravity: 1.020   (Normal Range: 1.003-1.035) Blood: 3+   (Normal Range: Negative) pH: 5.0   (Normal Range: 5.0-8.0) Protein: 2+   (Normal Range: Negative) Urobilinogen: 0.2   (Normal Range: 0-1) Nitrite: negative   (Normal Range: Negative) Leukocyte Esterace: negative   (Normal Range: Negative)    Comments: Joyce Gross  July 27, 2010 9:32 AM

## 2010-09-20 NOTE — Progress Notes (Signed)
Summary: Refill Lantus  Phone Note Refill Request Message from:  Pharmacy on April 20, 2010 9:51 AM  Refills Requested: Medication #1:  LANTUS SOLOSTAR 100 UNIT/ML SOLN 100 u at bedtime.   Dosage confirmed as above?Dosage Confirmed Initial call taken by: Clearnce Sorrel CMA,  April 20, 2010 9:51 AM    Prescriptions: LANTUS SOLOSTAR 100 UNIT/ML SOLN (INSULIN GLARGINE) 100 u at bedtime  #3 pens x 5   Entered by:   Clearnce Sorrel CMA   Authorized by:   Dorena Cookey MD   Signed by:   Clearnce Sorrel CMA on 04/20/2010   Method used:   Electronically to        [facility] # [id number]* ([place]ail[place]  [address]       Ph: [phone]       Fax: [phone]   RxID:   [id number]

## 2010-09-20 NOTE — Progress Notes (Signed)
Summary: latus   Phone Note Refill Request Message from:  Fax from Pharmacy on April 29, 2010 10:23 AM  Refills Requested: Medication #1:  LANTUS SOLOSTAR 100 UNIT/ML SOLN 100 u at bedtime. Initial call taken by: Westley Hummer CMA Deborra Medina),  April 29, 2010 10:23 AM  Follow-up for Phone Call        insurance does not cover the lantus pens  Follow-up by: Westley Hummer CMA Deborra Medina),  April 29, 2010 10:27 AM    New/Updated Medications: LANTUS 100 UNIT/ML SOLN (INSULIN GLARGINE) use 100 units at bedtime Prescriptions: LANTUS 100 UNIT/ML SOLN (INSULIN GLARGINE) use 100 units at bedtime  #30 day x 6   Entered by:   Westley Hummer CMA ([place])   Authorized by:   Dorena Cookey MD   Signed by:   Westley Hummer CMA ([place]) on 04/29/2010   Method used:   Electronically to        [facility] # [id number]* (retail)       [place]  [address]       Ph: [phone]       Fax: [phone]   RxID:   [id number]

## 2010-09-20 NOTE — Progress Notes (Signed)
  Phone Note Outgoing Call   Summary of Call: advise spill to hold the Lasix because his BUN is gone up to 66, creatinine 2.4, a month ago, was 36, with 1.2.  Then, take the Lasix every other day.  Drink lots of water avoid salt.  Follow-up as outlined Initial call taken by: Dorena Cookey MD,  February 22, 2010 5:14 PM

## 2010-09-20 NOTE — Assessment & Plan Note (Signed)
Summary: 2 month rov/njr   Vital Signs:  Patient profile:   60 year old male Weight:      294 pounds Temp:     98.5 degrees F oral BP sitting:   140 / 86  (left arm) Cuff size:   large  Vitals Entered By: Westley Hummer CMA Deborra Medina) (September 06, 2009 8:46 AM)  Reason for Visit 2 month follow up  History of Present Illness: Kent Osborne is a 60 year old male, single, nonsmoker, with underlying type 1 diabetes, hypertension, hyperlipidemia, coronary artery disease, status post MI with stents, who comes in today for follow-up.  He developed double vision in his left eye.  The ophthalmologist felt that it was most likely related to his hypertension.  With increased his medication however, his blood pressure is still elevated.  He states in the morning before he takes his medications if systolic will be between [id number] and 180.  He is taken Benicar one daily, along with labetalol 300 b.i.d., and Lasix 40 mg Monday, Wednesday, Friday.  He still has some peripheral edema, however, if we increase the Lasix to daily he develops marked renal insufficiency.  Last GFR two months ago, was 55 with a BUN of 25  a fasting blood sugar today was 103.  Last A1c was 6.9% in October  Allergies: No Known Drug Allergies  Past History:  Past medical, surgical, family and social histories (including risk factors) reviewed for relevance to current acute and chronic problems.  Past Medical History: Reviewed history from 10/08/2008 and no changes required. Hypertension OBESE Diabetes mellitus, type II Nephrolithiasis, hx of Coronary artery disease Dec 31, 2007 admitted to [facility] at stents placed in the circumflex and his LAD Hyperlipidemia Diabetes mellitus, type I Osteoarthritis Asthma  Past Surgical History: Reviewed history from 02/21/2007 and no changes required. Ashok Croon  Family History: Reviewed history from 01/06/2008 and no changes required. Family History Diabetes 1st degree  relative Family History High cholesterol Family History Hypertension  Social History: Reviewed history from 05/26/2009 and no changes required. Single Never Smoked Alcohol use-no Regular exercise-no Retired..........disabled because of underlying hypertension, diabetes, renal insufficiency, and coronary artery disease  Review of Systems      See HPI  Physical Exam  General:  Well-developed,well-nourished,in no acute distress; alert,appropriate and cooperative throughout examination Heart:  180/80   Impression & Recommendations:  Problem # 1:  HYPERTENSION (ICD-401.9) Assessment Deteriorated  His updated medication list for this problem includes:    Furosemide 40 Mg Tabs (Furosemide) .Marland Kitchen... Take one tab once daily    Labetalol Hcl 300 Mg Tabs (Labetalol hcl) .Marland Kitchen... 2 by mouth two times a day    Benicar 40 Mg Tabs (Olmesartan medoxomil) .Marland Kitchen... Take 1 tablet by mouth every morning  Orders: Venipuncture [id number]) TLB-BMP (Basic Metabolic Panel-BMET) (99991111) TLB-A1C / Hgb A1C (Glycohemoglobin) (83036-A1C) Prescription Created Electronically [phone])  Problem # 2:  DIABETES MELLITUS, TYPE II (ICD-250.00) Assessment: Improved  His updated medication list for this problem includes:    Glucotrol Xl 10 Mg Tb24 (Glipizide) .Marland Kitchen..Marland Kitchen Two times a day    Glucophage 1000 Mg Tabs (Metformin hcl) .Marland Kitchen..Marland Kitchen Two times a day    Adult Aspirin Ec Low Strength 81 Mg Tbec (Aspirin) ..... Once daily    Lantus 100 Unit/ml Soln (Insulin glargine) .Marland KitchenMarland KitchenMarland KitchenMarland Kitchen 100  uqhs    Humalog 100 Unit/ml Soln (Insulin lispro (human)) .Marland KitchenMarland KitchenMarland KitchenMarland Kitchen 30 units once daily  using sliding scale    Benicar 40 Mg Tabs (Olmesartan medoxomil) .Marland Kitchen... Take 1 tablet by mouth every  morning  Complete Medication List: 1)  Flexeril 10 Mg Tabs (Cyclobenzaprine hcl) 2)  Vicodin Es 7.5-750 Mg Tabs (Hydrocodone-acetaminophen) .... Take 1 tablet by mouth three times a day 3)  Glucotrol Xl 10 Mg Tb24 (Glipizide) .... Two times a day 4)  Glucophage  1000 Mg Tabs (Metformin hcl) .... Two times a day 5)  Simvastatin 40 Mg Tabs (Simvastatin) .... Once daily 6)  Adult Aspirin Ec Low Strength 81 Mg Tbec (Aspirin) .... Once daily 7)  Cetp Inhibitor  8)  Lantus 100 Unit/ml Soln (Insulin glargine) .Marland Kitchen.. 100  uqhs 9)  Bd Insulin Syringe Microfine 28g X 1/2" 1 Ml Misc (Insulin syringe-needle u-100) .... 65u at bedtime 10)  Furosemide 40 Mg Tabs (Furosemide) .... Take one tab once daily 11)  Humalog 100 Unit/ml Soln (Insulin lispro (human)) .... 30 units once daily  using sliding scale 12)  Labetalol Hcl 300 Mg Tabs (Labetalol hcl) .... 2 by mouth two times a day 13)  Benicar 40 Mg Tabs (Olmesartan medoxomil) .... Take 1 tablet by mouth every morning  Patient Instructions: 1)  increase the labetalol two 600 mg twice a day.  Continue the Benicar 40 mg daily in the morning check a morning blood pressure daily.  Call me in 3 weeks with the data and we will discuss any changes by telephone. 2)  Continue current treatment for your blood sugar.  We will call you picture A1c back Prescriptions: LABETALOL HCL 300 MG TABS (LABETALOL HCL) 2 by mouth two times a day  #270 x 3   Entered and Authorized by:   Dorena Cookey MD   Signed by:   Dorena Cookey MD on 09/06/2009   Method used:   Electronically to        [facility] (retail)       [place]  [address]       Ph: [phone]       Fax: [phone]   RxID:   [id number]

## 2010-09-20 NOTE — Progress Notes (Signed)
Summary: bp reading   Phone Note Call from Patient   Summary of Call: patient calling with blood pressure readings and to let you know that he is having a stress test tomorrow. monday - 192/68 p71, tueday 155/63 p74, wednesday 160/67 p73, thursday 142/62 p 72, friday 150/63 p 80, sat 182/69 p 70, monday 189/74 p70, tuesday 176/65 p 73, at 10 am 138/73.  patient states that his blood pressure does well until lunch time. Initial call taken by: Westley Hummer CMA Deborra Medina),  September 28, 2009 5:11 PM  Follow-up for Phone Call        asked the cardiologist it is doing your stress test for a second opinion on your blood pressure medication Follow-up by: Dorena Cookey MD,  September 28, 2009 5:14 PM  Additional Follow-up for Phone Call Additional follow up Details #1::        Phone Call Completed Additional Follow-up by: Westley Hummer CMA Deborra Medina),  September 28, 2009 5:16 PM

## 2010-09-20 NOTE — Progress Notes (Signed)
Summary: cough worse  Phone Note Call from Patient   Summary of Call: patient is now calling because he is now cough up green/yellow mucous should something be called in?   [facility] Initial call taken by: Westley Hummer CMA Deborra Medina),  November 17, 2009 2:20 PM  Follow-up for Phone Call        Apolonio Schneiders please call is no med.  Allergies....... Biaxin, 500 mg, dispensed 20, directions one p.o. b.i.d. no refills Follow-up by: Dorena Cookey MD,  November 18, 2009 11:05 AM  Additional Follow-up for Phone Call Additional follow up Details #1::        Phone Call Completed, Rx Called In Additional Follow-up by: Westley Hummer CMA Deborra Medina),  November 18, 2009 12:00 PM    New/Updated Medications: BIAXIN 500 MG TABS (CLARITHROMYCIN) take one tab by mouth two times a day Prescriptions: BIAXIN 500 MG TABS (CLARITHROMYCIN) take one tab by mouth two times a day  #20 x 0   Entered by:   Westley Hummer CMA ([place])   Authorized by:   Dorena Cookey MD   Signed by:   Westley Hummer CMA ([place]) on 11/18/2009   Method used:   Electronically to        [facility] [address] ([place]ail[place]  [address]       Ph: [phone]       Fax: [phone]   RxID:   [id number]

## 2010-09-20 NOTE — Assessment & Plan Note (Signed)
Summary: 1 month rov/njr   Vital Signs:  Patient profile:   60 year old male Weight:      290 pounds Temp:     98.2 degrees F oral BP sitting:   150 / 70  (left arm) Cuff size:   regular  Vitals Entered By: Westley Hummer CMA Deborra Medina) (Dec 23, 2009 9:40 AM) CC: follow-up visit   CC:  follow-up visit.  History of Present Illness: Kent Osborne is a 60 year old male, nonsmoker, who has underlying diabetes, chronic renal insufficiency, coronary disease, hypertension, who comes in today for reevaluation of the above problems.  as  noticed in his previous note.  He had an emergency room visit because of chest pain.  At that time.  His blood pressure was markedly elevated.  We restarted his Lasix 40 mg Monday, Wednesday, Friday.  He diuresed 9 pounds.  He still has peripheral edema, but no shortness of breath, and blood pressure now is 150/70.  Right arm sitting position.  His cardiologist, has stopped his simvastatin one month ago, and advised to get a follow-up lipid panel, which we will do today.  He continues to do well managing his blood sugar with the combination of Glucotrol 10 mg b.i.d., Glucophage 1000 mg b.i.d., and Lantus 100 units nightly.    Allergies: No Known Drug Allergies  Past History:  Past medical, surgical, family and social histories (including risk factors) reviewed for relevance to current acute and chronic problems.  Past Medical History: Reviewed history from 10/08/2008 and no changes required. Hypertension OBESE Diabetes mellitus, type II Nephrolithiasis, hx of Coronary artery disease Dec 31, 2007 admitted to [facility] at stents placed in the circumflex and his LAD Hyperlipidemia Diabetes mellitus, type I Osteoarthritis Asthma  Past Surgical History: Reviewed history from 02/21/2007 and no changes required. Ashok Croon  Family History: Reviewed history from 01/06/2008 and no changes required. Family History Diabetes 1st degree  relative Family History High cholesterol Family History Hypertension  Social History: Reviewed history from 05/26/2009 and no changes required. Single Never Smoked Alcohol use-no Regular exercise-no Retired..........disabled because of underlying hypertension, diabetes, renal insufficiency, and coronary artery disease  Review of Systems      See HPI  Physical Exam  General:  Well-developed,well-nourished,in no acute distress; alert,appropriate and cooperative throughout examination Heart:  150/70 right arm sitting position Extremities:  2+ left pedal edema and 2+ right pedal edema.     Complete Medication List: 1)  Flexeril 10 Mg Tabs (Cyclobenzaprine hcl) 2)  Vicodin Es 7.5-750 Mg Tabs (Hydrocodone-acetaminophen) .... Take 1 tablet by mouth three times a day 3)  Glucotrol Xl 10 Mg Tb24 (Glipizide) .... Two times a day 4)  Glucophage 1000 Mg Tabs (Metformin hcl) .... Two times a day 5)  Simvastatin 40 Mg Tabs (Simvastatin) .... Once daily - stopped- 6)  Adult Aspirin Ec Low Strength 81 Mg Tbec (Aspirin) .... Once daily 7)  Cetp Inhibitor  8)  Lantus 100 Unit/ml Soln (Insulin glargine) .Marland Kitchen.. 100  uqhs 9)  Bd Insulin Syringe Microfine 28g X 1/2" 1 Ml Misc (Insulin syringe-needle u-100) .... 65u at bedtime 10)  Furosemide 40 Mg Tabs (Furosemide) .... Take one tab once daily 11)  Humalog 100 Unit/ml Soln (Insulin lispro (human)) .... 30 units once daily  using sliding scale 12)  Labetalol Hcl 300 Mg Tabs (Labetalol hcl) .... 2 by mouth two times a day 13)  Benicar Hct 40-12.5 Mg Tabs (Olmesartan medoxomil-hctz) .... Take one tab once daily 14)  Effient 10 Mg  Tabs (Prasugrel hcl) .... Take one tab once daily 15)  Prednisone 20 Mg Tabs (Prednisone) .... Uad 16)  Hydromet 5-1.5 Mg/34ml Syrp (Hydrocodone-homatropine) .... 1/2 to 1 tsp at bedtime 17)  Biaxin 500 Mg Tabs (Clarithromycin) .... Take one tab by mouth two times a day  Other Orders: Venipuncture [id number]) TLB-Lipid Panel  (80061-LIPID) TLB-BMP (Basic Metabolic Panel-BMET) (99991111) TLB-A1C / Hgb A1C (Glycohemoglobin) (83036-A1C)  Patient Instructions: 1)  take a 40-mg Lasix tablet every other day. 2)  Continue your other medications. 3)  be  meticulous about no salt. 4)  I will call  your lab reports

## 2010-09-20 NOTE — Assessment & Plan Note (Signed)
Summary: lft foot pain/cjr   Vital Signs:  Patient profile:   60 year old male Weight:      305 pounds Temp:     97.7 degrees F oral BP sitting:   140 / 80  (left arm) Cuff size:   regular  Vitals Entered By: Westley Hummer CMA Deborra Medina) (July 05, 2010 8:47 AM) CC: left foot pain   CC:  left foot pain.  History of Present Illness: Kent Osborne is a 60 year old male, who comes in today for evaluation of gout.  He said a history of repeated gout attacks.  I  the last attack was two months ago.  Two days ago he began noticing some swelling of the dorsum of his left foot is decreased today.  He recently saw his nephrologist in [place], who recommended he continue his current treatment program except lose weight  Allergies: No Known Drug Allergies  Physical Exam  General:  Well-developed,well-nourished,in no acute distress; alert,appropriate and cooperative throughout examination Msk:  slightly red, slightly tender, dorsum, left foot, great toe   Impression & Recommendations:  Problem # 1:  GOUT, UNSPECIFIED (ICD-274.9) Assessment New  His updated medication list for this problem includes:    Colcrys 0.6 Mg Tabs (Colchicine) .Marland Kitchen... Take 1 tablet by mouth every morning    Allopurinol 300 Mg Tabs (Allopurinol) .Marland Kitchen... Take 1 tablet by mouth every morning  Complete Medication List: 1)  Flexeril 10 Mg Tabs (Cyclobenzaprine hcl) .... One every 8 hour as needed 2)  Glucotrol Xl 10 Mg Tb24 (Glipizide) .... Two times a day 3)  Glucophage 1000 Mg Tabs (Metformin hcl) .... Two times a day 4)  Simvastatin 40 Mg Tabs (Simvastatin) .... Once daily 5)  Adult Aspirin Ec Low Strength 81 Mg Tbec (Aspirin) .... Once daily 6)  Cetp Inhibitor  7)  Bd Insulin Syringe Microfine 28g X 1/2" 1 Ml Misc (Insulin syringe-needle u-100) .... 65u at bedtime 8)  Furosemide 40 Mg Tabs (Furosemide) .... Take one tab every other day 9)  Humalog 100 Unit/ml Soln (Insulin lispro (human)) .Marland Kitchen.. 15 to 17 units 3 times a  day 10)  Labetalol Hcl 300 Mg Tabs (Labetalol hcl) .... 2 by mouth two times a day 11)  Benicar Hct 40-12.5 Mg Tabs (Olmesartan medoxomil-hctz) .... Take one tab once daily 12)  Effient 10 Mg Tabs (Prasugrel hcl) .... Take one tab once daily 13)  Vicodin Hp 10-660 Mg Tabs (Hydrocodone-acetaminophen) .... Take 1 tablet by mouth three times a day 14)  Lantus 100 Unit/ml Soln (Insulin glargine) .... Use 100 units at bedtime 15)  Colcrys 0.6 Mg Tabs (Colchicine) .... Take 1 tablet by mouth every morning 16)  Allopurinol 300 Mg Tabs (Allopurinol) .... Take 1 tablet by mouth every morning  Patient Instructions: 1)   take the  colcryx one daily, x 7 days.  Also, today, start allopurinol 300 mg nightly daily Prescriptions: ALLOPURINOL 300 MG TABS (ALLOPURINOL) Take 1 tablet by mouth every morning  #100 x 3   Entered and Authorized by:   Dorena Cookey MD   Signed by:   Dorena Cookey MD on 07/05/2010   Method used:   Print then Give to Patient   RxID:   [id number] COLCRYS 0.6 MG TABS (COLCHICINE) Take 1 tablet by mouth every morning  #7 x 2   Entered and Authorized by:   Dorena Cookey MD   Signed by:   Dorena Cookey MD on 07/05/2010   Method used:   Print then  Give to Patient   RxID:   [id number]    Orders Added: 1)  Est. Patient Level III [id number]

## 2010-09-20 NOTE — Assessment & Plan Note (Signed)
Summary: MED CK (REFILLS) // RS   Vital Signs:  Patient profile:   60 year old male Weight:      292 pounds Temp:     98.0 degrees F oral Resp:     12 per minute BP sitting:   190 / 100  (left arm) Cuff size:   regular  Vitals Entered By: Westley Hummer CMA Deborra Medina) (December 09, 2009 9:58 AM) CC: follow-up visit   CC:  follow-up visit.  History of Present Illness: Kent Osborne is a 60 year old single male, nonsmoker, who comes in today for evaluation of hypertension, diabetes, coronary disease, renal insufficiency, and peripheral edema.  Last week.  He went out to play golf and after golf.  He went to a [employer].  He ate a lot of salt and later on that evening, developed chest pain.  He says it was a two on a scale of one to 10, but was in the middle of his chest and he felt in his hand.  He had no shortness of breath nor diaphoresis.  Because he said previous heart attack and stents.  He went to the emergency room.  Cardiac, pulmonary, workup are negative.  They told him his creatinine was marginally elevated 1.6.  This is been present before.  He has a history of renal insufficiency from his diabetes.  He takes Lasix 40 mg Monday, Wednesday, Friday.  If he takes it every day.  He goes into renal failure.  His fasting blood sugars on insulin average around 110, 120 in the morning.  He is compliant with his other medications.  He was recently notified.  They did does qualify for disability  Allergies: No Known Drug Allergies  Past History:  Past medical, surgical, family and social histories (including risk factors) reviewed for relevance to current acute and chronic problems.  Past Medical History: Reviewed history from 10/08/2008 and no changes required. Hypertension OBESE Diabetes mellitus, type II Nephrolithiasis, hx of Coronary artery disease Dec 31, 2007 admitted to [facility] at stents placed in the circumflex and his LAD Hyperlipidemia Diabetes mellitus, type  I Osteoarthritis Asthma  Past Surgical History: Reviewed history from 02/21/2007 and no changes required. Ashok Croon  Family History: Reviewed history from 01/06/2008 and no changes required. Family History Diabetes 1st degree relative Family History High cholesterol Family History Hypertension  Social History: Reviewed history from 05/26/2009 and no changes required. Single Never Smoked Alcohol use-no Regular exercise-no Retired..........disabled because of underlying hypertension, diabetes, renal insufficiency, and coronary artery disease  Review of Systems      See HPI  Physical Exam  General:  Well-developed,well-nourished,in no acute distress; alert,appropriate and cooperative throughout examination Chest Wall:  No deformities, masses, tenderness or gynecomastia noted. Lungs:  Normal respiratory effort, chest expands symmetrically. Lungs are clear to auscultation, no crackles or wheezes. Heart:  Normal rate and regular rhythm. S1 and S2 normal without gallop, murmur, click, rub or other extra sounds. Extremities:  2+ left pedal edema and 2+ right pedal edema.     Impression & Recommendations:  Problem # 1:  HYPERTENSION (ICD-401.9) Assessment Deteriorated  His updated medication list for this problem includes:    Furosemide 40 Mg Tabs (Furosemide) .Marland Kitchen... Take one tab once daily    Labetalol Hcl 300 Mg Tabs (Labetalol hcl) .Marland Kitchen... 2 by mouth two times a day    Benicar Hct 40-12.5 Mg Tabs (Olmesartan medoxomil-hctz) .Marland Kitchen... Take one tab once daily  Problem # 2:  DIABETES MELLITUS, TYPE I (ICD-250.01) Assessment: Improved  His updated medication list for this problem includes:    Glucotrol Xl 10 Mg Tb24 (Glipizide) .Marland Kitchen..Marland Kitchen Two times a day    Glucophage 1000 Mg Tabs (Metformin hcl) .Marland Kitchen..Marland Kitchen Two times a day    Adult Aspirin Ec Low Strength 81 Mg Tbec (Aspirin) ..... Once daily    Lantus 100 Unit/ml Soln (Insulin glargine) .Marland KitchenMarland KitchenMarland KitchenMarland Kitchen 100  uqhs    Humalog 100 Unit/ml Soln  (Insulin lispro (human)) .Marland KitchenMarland KitchenMarland KitchenMarland Kitchen 30 units once daily  using sliding scale    Benicar Hct 40-12.5 Mg Tabs (Olmesartan medoxomil-hctz) .Marland Kitchen... Take one tab once daily  Complete Medication List: 1)  Flexeril 10 Mg Tabs (Cyclobenzaprine hcl) 2)  Vicodin Es 7.5-750 Mg Tabs (Hydrocodone-acetaminophen) .... Take 1 tablet by mouth three times a day 3)  Glucotrol Xl 10 Mg Tb24 (Glipizide) .... Two times a day 4)  Glucophage 1000 Mg Tabs (Metformin hcl) .... Two times a day 5)  Simvastatin 40 Mg Tabs (Simvastatin) .... Once daily 6)  Adult Aspirin Ec Low Strength 81 Mg Tbec (Aspirin) .... Once daily 7)  Cetp Inhibitor  8)  Lantus 100 Unit/ml Soln (Insulin glargine) .Marland Kitchen.. 100  uqhs 9)  Bd Insulin Syringe Microfine 28g X 1/2" 1 Ml Misc (Insulin syringe-needle u-100) .... 65u at bedtime 10)  Furosemide 40 Mg Tabs (Furosemide) .... Take one tab once daily 11)  Humalog 100 Unit/ml Soln (Insulin lispro (human)) .... 30 units once daily  using sliding scale 12)  Labetalol Hcl 300 Mg Tabs (Labetalol hcl) .... 2 by mouth two times a day 13)  Benicar Hct 40-12.5 Mg Tabs (Olmesartan medoxomil-hctz) .... Take one tab once daily 14)  Effient 10 Mg Tabs (Prasugrel hcl) .... Take one tab once daily 15)  Prednisone 20 Mg Tabs (Prednisone) .... Uad 16)  Hydromet 5-1.5 Mg/36ml Syrp (Hydrocodone-homatropine) .... 1/2 to 1 tsp at bedtime 17)  Biaxin 500 Mg Tabs (Clarithromycin) .... Take one tab by mouth two times a day  Patient Instructions: 1)  stay and a complete salt free diet and take 40 mg Lasix daily for the next 5 days, then go back to normal schedule of one tablet on Monday, Wednesday, Friday.  Also, it's helpful to keep her legs elevated as much as possible.  Check a blood pressure in the morning daily.  Return in two weeks for follow-up

## 2010-09-20 NOTE — Assessment & Plan Note (Signed)
Summary: ?kidneystones/njr   Vital Signs:  Patient profile:   60 year old male Temp:     98.4 degrees F oral BP sitting:   130 / 90  (left arm) Cuff size:   regular  Vitals Entered By: Westley Hummer CMA Deborra Medina) (April 18, 2010 12:06 PM) CC: lower left back pain   CC:  lower left back pain.  History of Present Illness: Kent Osborne is a 60 year old male, nonsmoker, type I diabetic, who comes in today for evaluation of low back pain.  He said a history of recurrent low back pain.  Last episode this severe was about a year ago.  This was triggered by playing golf last Saturday.  He was able to play two holes because he developed the sudden onset of severe pain.  He's been at bed rest at home, but it hasn't helped.  He feels better when he lies flat.  The pain diminishes, but does not go completely away.  He describes the pain is located to the left lumbar area.  It sharp, a 10 on a scale of one to 10 however, does not radiate below his waist.  Neurologic review of systems negative  Allergies: No Known Drug Allergies  Past History:  Past medical, surgical, family and social histories (including risk factors) reviewed for relevance to current acute and chronic problems.  Past Medical History: Reviewed history from 10/08/2008 and no changes required. Hypertension OBESE Diabetes mellitus, type II Nephrolithiasis, hx of Coronary artery disease Dec 31, 2007 admitted to [facility] at stents placed in the circumflex and his LAD Hyperlipidemia Diabetes mellitus, type I Osteoarthritis Asthma  Past Surgical History: Reviewed history from 02/21/2007 and no changes required. Ashok Croon  Family History: Reviewed history from 01/06/2008 and no changes required. Family History Diabetes 1st degree relative Family History High cholesterol Family History Hypertension  Social History: Reviewed history from 05/26/2009 and no changes required. Single Never Smoked Alcohol  use-no Regular exercise-no Retired..........disabled because of underlying hypertension, diabetes, renal insufficiency, and coronary artery disease  Review of Systems      See HPI  Physical Exam  General:  Well-developed,well-nourished,in no acute distress; alert,appropriate and cooperative throughout examination Msk:  No deformity or scoliosis noted of thoracic or lumbar spine.   Pulses:  R and L carotid,radial,femoral,dorsalis pedis and posterior tibial pulses are full and equal bilaterally Extremities:  No clubbing, cyanosis, edema, or deformity noted with normal full range of motion of all joints.   Neurologic:  No cranial nerve deficits noted. Station and gait are normal. Plantar reflexes are down-going bilaterally. DTRs are symmetrical throughout. Sensory, motor and coordinative functions appear intact.   Impression & Recommendations:  Problem # 1:  BACK PAIN, LUMBAR (ICD-724.2) Assessment Deteriorated  His updated medication list for this problem includes:    Flexeril 10 Mg Tabs (Cyclobenzaprine hcl)    Adult Aspirin Ec Low Strength 81 Mg Tbec (Aspirin) ..... Once daily    Vicodin Hp 10-660 Mg Tabs (Hydrocodone-acetaminophen) .Marland Kitchen... Take 1 tablet by mouth three times a day  Complete Medication List: 1)  Flexeril 10 Mg Tabs (Cyclobenzaprine hcl) 2)  Glucotrol Xl 10 Mg Tb24 (Glipizide) .... Two times a day 3)  Glucophage 1000 Mg Tabs (Metformin hcl) .... Two times a day 4)  Simvastatin 40 Mg Tabs (Simvastatin) .... Once daily - stopped- 5)  Adult Aspirin Ec Low Strength 81 Mg Tbec (Aspirin) .... Once daily 6)  Cetp Inhibitor  7)  Bd Insulin Syringe Microfine 28g X 1/2" 1 Ml Misc (  Insulin syringe-needle u-100) .... 65u at bedtime 8)  Furosemide 40 Mg Tabs (Furosemide) .... Take one tab once daily 9)  Humalog 100 Unit/ml Soln (Insulin lispro (human)) .... 30 units once daily  using sliding scale 10)  Labetalol Hcl 300 Mg Tabs (Labetalol hcl) .... 2 by mouth two times a day 11)   Benicar Hct 40-12.5 Mg Tabs (Olmesartan medoxomil-hctz) .... Take one tab once daily 12)  Effient 10 Mg Tabs (Prasugrel hcl) .... Take one tab once daily 13)  Vicodin Hp 10-660 Mg Tabs (Hydrocodone-acetaminophen) .... Take 1 tablet by mouth three times a day 14)  Lantus Solostar 100 Unit/ml Soln (Insulin glargine) .Marland Kitchen.. 100 u at bedtime  Other Orders: UA Dipstick w/o Micro (manual) [id number])  Patient Instructions: 1)  stay at complete bed rest today, tomorrow, and possibly Wednesday.  Take the Flexeril and Vicodin 3 to 4 times daily.  After Wednesday, walk........... lie down.............. walk........... lie down.  Another option is to get to set up to see a physical therapist.  If you would like to do that we can arrange for that to be done in [place] where you live Prescriptions: LANTUS SOLOSTAR 100 UNIT/ML SOLN (INSULIN GLARGINE) 100 u at bedtime  #3 pens x 5   Entered and Authorized by:   Dorena Cookey MD   Signed by:   Dorena Cookey MD on 04/18/2010   Method used:   Print then Give to Patient   RxID:   [id number] HUMALOG 100 UNIT/ML SOLN (INSULIN LISPRO (HUMAN)) 30 units once daily  using sliding scale  #3 vials x 4   Entered and Authorized by:   Dorena Cookey MD   Signed by:   Dorena Cookey MD on 04/18/2010   Method used:   Print then Give to Patient   RxID:   [id number] LANTUS 100 UNIT/ML  SOLN (INSULIN GLARGINE) 100  uqhs Brand medically necessary #3 vials x 4   Entered and Authorized by:   Dorena Cookey MD   Signed by:   Dorena Cookey MD on 04/18/2010   Method used:   Print then Give to Patient   RxID:   [id number] VICODIN HP 10-660 MG TABS (HYDROCODONE-ACETAMINOPHEN) Take 1 tablet by mouth three times a day  #50 x 1   Entered and Authorized by:   Dorena Cookey MD   Signed by:   Dorena Cookey MD on 04/18/2010   Method used:   Print then Give to Patient   RxID:   [id number] FLEXERIL 10 MG TABS (CYCLOBENZAPRINE HCL)   #50 x 1   Entered and  Authorized by:   Dorena Cookey MD   Signed by:   Dorena Cookey MD on 04/18/2010   Method used:   Print then Give to Patient   RxID:   [id number]   Laboratory Results   Urine Tests  Date/Time Received: April 18, 2010   Routine Urinalysis   Color: yellow Appearance: Clear Glucose: negative   (Normal Range: Negative) Bilirubin: negative   (Normal Range: Negative) Ketone: negative   (Normal Range: Negative) Spec. Gravity: 1.020   (Normal Range: 1.003-1.035) Blood: negative   (Normal Range: Negative) pH: 6.0   (Normal Range: 5.0-8.0) Protein: negative   (Normal Range: Negative) Urobilinogen: 0.2   (Normal Range: 0-1) Nitrite: negative   (Normal Range: Negative) Leukocyte Esterace: negative   (Normal Range: Negative)    Comments: Westley Hummer CMA Deborra Medina)  April 18, 2010 12:07 PM

## 2010-09-20 NOTE — Progress Notes (Signed)
Summary: Call-A-Nurse Report    Call-A-Nurse Triage Call Report Triage Record Num: [id number] Operator: Earnest Rosier Patient Name: Kent Osborne Call Date & Time: 12/23/2009 6:03:12PM Patient Phone: [phone] PCP: Jory Ee. Cammie Faulstich Patient Gender: Male PCP Fax : [phone] Patient DOB: 08-Jul-1951 Practice Name: [facility] Reason for Call: Mardella Layman calling from [facility] lab [phone], with K + 6.0 (critical) normal 2.9 to 5.0; prior: 5.7 09/06/2009, not hemolyzed. Per office profile, call to pt @ 1808, no answer. Needs to be advised not to take potassium/ace inhibitor, and advised to call office 12/24/09. Protocol(s) Used: PCP Calls, No Triage (Adult) Recommended Outcome per Protocol: Call Provider within 24 Hours Reason for Outcome: Lab calling with test results Care Advice:  ~ 12/23/2009 6:14:36PM Page 1 of 1 [email or web address]

## 2010-09-20 NOTE — Assessment & Plan Note (Signed)
Summary: 30 min ov per pt/njr   Vital Signs:  Patient profile:   60 year old male Weight:      292 pounds Temp:     98.2 degrees F oral BP sitting:   132 / 89  (right arm) Cuff size:   regular  Vitals Entered By: Clearnce Sorrel CMA (May 05, 2010 10:20 AM) CC: 30 min ov, src Is Patient Diabetic? Yes Flu Vaccine Consent Questions     Do you have a history of severe allergic reactions to this vaccine? no    Any prior history of allergic reactions to egg and/or gelatin? no    Do you have a sensitivity to the preservative Thimersol? no    Do you have a past history of Guillan-Barre Syndrome? no    Do you currently have an acute febrile illness? no    Have you ever had a severe reaction to latex? no    Vaccine information given and explained to patient? yes    Are you currently pregnant? no    Lot Number:[id number]   Exp Date:02/18/2011   Site Given  Left Deltoid IM   CC:  30 min ov and src.  History of Present Illness: Kent Osborne is a 60 year old single male, who comes in today for evaluation of multiple issues.  He has underlying diabetes, for which he takes Glucotrol 10 mg b.i.d., metformin 1000 mg b.i.d., Humalog sliding scale before each meal, and Lantus 100 units nightly.  Will check A1c today.  He also takes Flexeril, 10 mg nightly and Vicodin one tab 3 times a day because of chronic lumbar disk disease.  A stable function with this medication.  He also takes labetalol 600 mg b.i.d., Benicar 40 -- 12.5 daily, and Lasix 40 mg every other day.  BP 132/89.  He also takes simvastatin 40 mg nightly for hyperlipidemia.  Recheck lipid panel today.  He gets routine eye care, has never had a colonoscopy, seasonal flu shot today.  He sees his cardiologist, and then to twice yearly also because of his GFR in the 30 to 40 range.  We have previously recommended.  Nephrology consult, however, he's not been able to afford it.  Now he has insurance.  He can have his cardiologist in  [place], refer him to the nephrology clinic.  Preventive Screening-Counseling & Management  Alcohol-Tobacco     Smoking Status: never  Current Medications (verified): 1)  Flexeril 10 Mg Tabs (Cyclobenzaprine Hcl) 2)  Glucotrol Xl 10 Mg  Tb24 (Glipizide) .... Two Times A Day 3)  Glucophage 1000 Mg  Tabs (Metformin Hcl) .... Two Times A Day 4)  Simvastatin 40 Mg  Tabs (Simvastatin) .... Once Daily 5)  Adult Aspirin Ec Low Strength 81 Mg  Tbec (Aspirin) .... Once Daily 6)  Cetp Inhibitor 7)  Bd Insulin Syringe Microfine 28g X 1/2" 1 Ml Misc (Insulin Syringe-Needle U-100) .... 65u At Bedtime 8)  Furosemide 40 Mg Tabs (Furosemide) .... Take One Tab Every Other Day 9)  Humalog 100 Unit/ml Soln (Insulin Lispro (Human)) .Marland Kitchen.. 15 To 17 Units 3 Times A Day 10)  Labetalol Hcl 300 Mg Tabs (Labetalol Hcl) .... 2 By Mouth Two Times A Day 11)  Benicar Hct 40-12.5 Mg Tabs (Olmesartan Medoxomil-Hctz) .... Take One Tab Once Daily 12)  Effient 10 Mg Tabs (Prasugrel Hcl) .... Take One Tab Once Daily 13)  Vicodin Hp 10-660 Mg Tabs (Hydrocodone-Acetaminophen) .... Take 1 Tablet By Mouth Three Times A Day 14)  Lantus 100 Unit/ml Soln (  Insulin Glargine) .... Use 100 Units At Bedtime  Allergies (verified): No Known Drug Allergies  Past History:  Past medical, surgical, family and social histories (including risk factors) reviewed, and no changes noted (except as noted below).  Past Medical History: Reviewed history from 10/08/2008 and no changes required. Hypertension OBESE Diabetes mellitus, type II Nephrolithiasis, hx of Coronary artery disease Dec 31, 2007 admitted to [facility] at stents placed in the circumflex and his LAD Hyperlipidemia Diabetes mellitus, type I Osteoarthritis Asthma  Past Surgical History: Reviewed history from 02/21/2007 and no changes required. Ashok Croon  Family History: Reviewed history from 01/06/2008 and no changes required. Family History Diabetes 1st  degree relative Family History High cholesterol Family History Hypertension  Social History: Reviewed history from 05/26/2009 and no changes required. Single Never Smoked Alcohol use-no Regular exercise-no Retired..........disabled because of underlying hypertension, diabetes, renal insufficiency, and coronary artery disease  Review of Systems      See HPI  Physical Exam  General:  Well-developed,well-nourished,in no acute distress; alert,appropriate and cooperative throughout examination Head:  Normocephalic and atraumatic without obvious abnormalities. No apparent alopecia or balding. Eyes:  No corneal or conjunctival inflammation noted. EOMI. Perrla. Funduscopic exam benign, without hemorrhages, exudates or papilledema. Vision grossly normal. Ears:  External ear exam shows no significant lesions or deformities.  Otoscopic examination reveals clear canals, tympanic membranes are intact bilaterally without bulging, retraction, inflammation or discharge. Hearing is grossly normal bilaterally. Nose:  External nasal examination shows no deformity or inflammation. Nasal mucosa are pink and moist without lesions or exudates. Mouth:  Oral mucosa and oropharynx without lesions or exudates.  Teeth in good repair. Neck:  No deformities, masses, or tenderness noted. Chest Wall:  No deformities, masses, tenderness or gynecomastia noted. Breasts:  No masses or gynecomastia noted Lungs:  Normal respiratory effort, chest expands symmetrically. Lungs are clear to auscultation, no crackles or wheezes. Heart:  Normal rate and regular rhythm. S1 and S2 normal without gallop, murmur, click, rub or other extra sounds. Abdomen:  Bowel sounds positive,abdomen soft and non-tender without masses, organomegaly or hernias noted. Rectal:  No external abnormalities noted. Normal sphincter tone. No rectal masses or tenderness. Genitalia:  Testes bilaterally descended without nodularity, tenderness or masses. No  scrotal masses or lesions. No penis lesions or urethral discharge. Prostate:  Prostate gland firm and smooth, no enlargement, nodularity, tenderness, mass, asymmetry or induration. Msk:  No deformity or scoliosis noted of thoracic or lumbar spine.   Pulses:  R and L carotid,radial,femoral,dorsalis pedis and posterior tibial pulses are full and equal bilaterally Extremities:  No clubbing, cyanosis, edema, or deformity noted with normal full range of motion of all joints.   Neurologic:  No cranial nerve deficits noted. Station and gait are normal. Plantar reflexes are down-going bilaterally. DTRs are symmetrical throughout. Sensory, motor and coordinative functions appear intact. Skin:  Intact without suspicious lesions or rashes Cervical Nodes:  No lymphadenopathy noted Axillary Nodes:  No palpable lymphadenopathy Inguinal Nodes:  No significant adenopathy Psych:  Cognition and judgment appear intact. Alert and cooperative with normal attention span and concentration. No apparent delusions, illusions, hallucinations  Diabetes Management Exam:    Foot Exam (with socks and/or shoes not present):       Sensory-Pinprick/Light touch:          Left medial foot (L-4): normal          Left dorsal foot (L-5): normal          Left lateral foot (S-1): normal  Right medial foot (L-4): normal          Right dorsal foot (L-5): normal          Right lateral foot (S-1): normal       Sensory-Monofilament:          Left foot: normal          Right foot: normal       Inspection:          Left foot: normal          Right foot: normal       Nails:          Left foot: normal          Right foot: normal    Eye Exam:       Eye Exam done elsewhere          Date: 12/28/2009          Results: normal          Done by: opth    Impression & Recommendations:  Problem # 1:  BACK PAIN, CHRONIC (ICD-724.5) Assessment Improved  His updated medication list for this problem includes:    Flexeril 10 Mg  Tabs (Cyclobenzaprine hcl) ..... One every 8 hour as needed    Adult Aspirin Ec Low Strength 81 Mg Tbec (Aspirin) ..... Once daily    Vicodin Hp 10-660 Mg Tabs (Hydrocodone-acetaminophen) .Marland Kitchen... Take 1 tablet by mouth three times a day  His updated medication list for this problem includes:    Flexeril 10 Mg Tabs (Cyclobenzaprine hcl)    Adult Aspirin Ec Low Strength 81 Mg Tbec (Aspirin) ..... Once daily    Vicodin Hp 10-660 Mg Tabs (Hydrocodone-acetaminophen) .Marland Kitchen... Take 1 tablet by mouth three times a day  Problem # 2:  CHRONIC KIDNEY DISEASE STAGE II (MILD) (ICD-585.2) Assessment: Unchanged  Orders: Venipuncture [id number]) Prescription Created Electronically [phone]) UA Dipstick w/o Micro (automated)  (81003) TLB-Lipid Panel (80061-LIPID) TLB-BMP (Basic Metabolic Panel-BMET) (99991111) TLB-CBC Platelet - w/Differential (85025-CBCD) TLB-Hepatic/Liver Function Pnl (80076-HEPATIC) TLB-TSH (Thyroid Stimulating Hormone) (84443-TSH) TLB-A1C / Hgb A1C (Glycohemoglobin) (83036-A1C) TLB-Microalbumin/Creat Ratio, Urine (82043-MALB) TLB-PSA (Prostate Specific Antigen) (84153-PSA)  Problem # 3:  PERIPHERAL EDEMA (ICD-782.3) Assessment: Unchanged  His updated medication list for this problem includes:    Furosemide 40 Mg Tabs (Furosemide) .Marland Kitchen... Take one tab every other day    Benicar Hct 40-12.5 Mg Tabs (Olmesartan medoxomil-hctz) .Marland Kitchen... Take one tab once daily  His updated medication list for this problem includes:    Furosemide 40 Mg Tabs (Furosemide) .Marland Kitchen... Take one tab every other day    Benicar Hct 40-12.5 Mg Tabs (Olmesartan medoxomil-hctz) .Marland Kitchen... Take one tab once daily  Orders: EKG w/ Interpretation (93000)  Problem # 4:  HYPERLIPIDEMIA (ICD-272.4) Assessment: Improved  His updated medication list for this problem includes:    Simvastatin 40 Mg Tabs (Simvastatin) ..... Once daily  His updated medication list for this problem includes:    Simvastatin 40 Mg Tabs (Simvastatin)  ..... Once daily  Orders: Venipuncture [id number]) Prescription Created Electronically [phone]) UA Dipstick w/o Micro (automated)  (81003) EKG w/ Interpretation (93000) TLB-Lipid Panel (80061-LIPID) TLB-BMP (Basic Metabolic Panel-BMET) (99991111) TLB-CBC Platelet - w/Differential (85025-CBCD) TLB-Hepatic/Liver Function Pnl (80076-HEPATIC) TLB-TSH (Thyroid Stimulating Hormone) (84443-TSH) TLB-A1C / Hgb A1C (Glycohemoglobin) (83036-A1C) TLB-Microalbumin/Creat Ratio, Urine (82043-MALB) TLB-PSA (Prostate Specific Antigen) (84153-PSA)  Problem # 5:  CORONARY ARTERY DISEASE (ICD-414.00) Assessment: Improved  His updated medication list for this problem includes:    Adult Aspirin Ec Low Strength  81 Mg Tbec (Aspirin) ..... Once daily    Furosemide 40 Mg Tabs (Furosemide) .Marland Kitchen... Take one tab every other day    Labetalol Hcl 300 Mg Tabs (Labetalol hcl) .Marland Kitchen... 2 by mouth two times a day    Benicar Hct 40-12.5 Mg Tabs (Olmesartan medoxomil-hctz) .Marland Kitchen... Take one tab once daily    Effient 10 Mg Tabs (Prasugrel hcl) .Marland Kitchen... Take one tab once daily  His updated medication list for this problem includes:    Adult Aspirin Ec Low Strength 81 Mg Tbec (Aspirin) ..... Once daily    Furosemide 40 Mg Tabs (Furosemide) .Marland Kitchen... Take one tab every other day    Labetalol Hcl 300 Mg Tabs (Labetalol hcl) .Marland Kitchen... 2 by mouth two times a day    Benicar Hct 40-12.5 Mg Tabs (Olmesartan medoxomil-hctz) .Marland Kitchen... Take one tab once daily    Effient 10 Mg Tabs (Prasugrel hcl) .Marland Kitchen... Take one tab once daily  Orders: Venipuncture [id number]) Prescription Created Electronically [phone]) UA Dipstick w/o Micro (automated)  (81003) EKG w/ Interpretation (93000) TLB-Lipid Panel (80061-LIPID) TLB-BMP (Basic Metabolic Panel-BMET) (99991111) TLB-CBC Platelet - w/Differential (85025-CBCD) TLB-Hepatic/Liver Function Pnl (80076-HEPATIC) TLB-TSH (Thyroid Stimulating Hormone) (84443-TSH) TLB-A1C / Hgb A1C (Glycohemoglobin)  (83036-A1C) TLB-Microalbumin/Creat Ratio, Urine (82043-MALB) TLB-PSA (Prostate Specific Antigen) (84153-PSA)  Problem # 6:  DIABETES MELLITUS, TYPE I (ICD-250.01) Assessment: Improved  His updated medication list for this problem includes:    Glucotrol Xl 10 Mg Tb24 (Glipizide) .Marland Kitchen..Marland Kitchen Two times a day    Glucophage 1000 Mg Tabs (Metformin hcl) .Marland Kitchen..Marland Kitchen Two times a day    Adult Aspirin Ec Low Strength 81 Mg Tbec (Aspirin) ..... Once daily    Humalog 100 Unit/ml Soln (Insulin lispro (human)) .Marland KitchenMarland KitchenMarland KitchenMarland Kitchen 15 to 17 units 3 times a day    Benicar Hct 40-12.5 Mg Tabs (Olmesartan medoxomil-hctz) .Marland Kitchen... Take one tab once daily    Lantus 100 Unit/ml Soln (Insulin glargine) ..... Use 100 units at bedtime  His updated medication list for this problem includes:    Glucotrol Xl 10 Mg Tb24 (Glipizide) .Marland Kitchen..Marland Kitchen Two times a day    Glucophage 1000 Mg Tabs (Metformin hcl) .Marland Kitchen..Marland Kitchen Two times a day    Adult Aspirin Ec Low Strength 81 Mg Tbec (Aspirin) ..... Once daily    Humalog 100 Unit/ml Soln (Insulin lispro (human)) .Marland KitchenMarland KitchenMarland KitchenMarland Kitchen 15 to 17 units 3 times a day    Benicar Hct 40-12.5 Mg Tabs (Olmesartan medoxomil-hctz) .Marland Kitchen... Take one tab once daily    Lantus 100 Unit/ml Soln (Insulin glargine) ..... Use 100 units at bedtime  Orders: Venipuncture [id number]) Prescription Created Electronically [phone]) UA Dipstick w/o Micro (automated)  (81003) TLB-Lipid Panel (80061-LIPID) TLB-BMP (Basic Metabolic Panel-BMET) (99991111) TLB-CBC Platelet - w/Differential (85025-CBCD) TLB-Hepatic/Liver Function Pnl (80076-HEPATIC) TLB-TSH (Thyroid Stimulating Hormone) (84443-TSH) TLB-A1C / Hgb A1C (Glycohemoglobin) (83036-A1C) TLB-Microalbumin/Creat Ratio, Urine (82043-MALB) TLB-PSA (Prostate Specific Antigen) (84153-PSA)  Complete Medication List: 1)  Flexeril 10 Mg Tabs (Cyclobenzaprine hcl) .... One every 8 hour as needed 2)  Glucotrol Xl 10 Mg Tb24 (Glipizide) .... Two times a day 3)  Glucophage 1000 Mg Tabs (Metformin hcl) .... Two  times a day 4)  Simvastatin 40 Mg Tabs (Simvastatin) .... Once daily 5)  Adult Aspirin Ec Low Strength 81 Mg Tbec (Aspirin) .... Once daily 6)  Cetp Inhibitor  7)  Bd Insulin Syringe Microfine 28g X 1/2" 1 Ml Misc (Insulin syringe-needle u-100) .... 65u at bedtime 8)  Furosemide 40 Mg Tabs (Furosemide) .... Take one tab every other day 9)  Humalog 100 Unit/ml Soln (Insulin lispro (  human)) .Marland Kitchen.. 15 to 17 units 3 times a day 10)  Labetalol Hcl 300 Mg Tabs (Labetalol hcl) .... 2 by mouth two times a day 11)  Benicar Hct 40-12.5 Mg Tabs (Olmesartan medoxomil-hctz) .... Take one tab once daily 12)  Effient 10 Mg Tabs (Prasugrel hcl) .... Take one tab once daily 13)  Vicodin Hp 10-660 Mg Tabs (Hydrocodone-acetaminophen) .... Take 1 tablet by mouth three times a day 14)  Lantus 100 Unit/ml Soln (Insulin glargine) .... Use 100 units at bedtime  Other Orders: Gastroenterology Referral (GI) Admin 1st Vaccine [id number]) Flu Vaccine 33yrs + [id number]) Tdap => 25yrs IM [id number]) Admin of Any Addtl Vaccine [id number])  Patient Instructions: 1)  Please schedule a follow-up appointment in 3 months. 2)  BMP prior to visit, ICD-9: 3)  HbgA1C prior to visit, ICD-9: 4)  Check your blood sugars regularly. If your readings are usually above : or below 70 you should contact our office. 5)  It is important that your Diabetic A1c level is checked every 3 months. 6)  See your eye doctor yearly to check for diabetic eye damage. 7)  Check your feet each night for sore areas, calluses or signs of infection. 8)  Check your Blood Pressure regularly. If it is above: you should make an appointment. 9)  Schedule a colonoscopy/sigmoidoscopy to help detect colon cancer. Prescriptions: VICODIN HP 10-660 MG TABS (HYDROCODONE-ACETAMINOPHEN) Take 1 tablet by mouth three times a day  #100 x 3   Entered and Authorized by:   Dorena Cookey MD   Signed by:   Dorena Cookey MD on 05/05/2010   Method used:   Print then Give to Patient    RxID:   [id number] FLEXERIL 10 MG TABS (CYCLOBENZAPRINE HCL)   #50 x 1   Entered and Authorized by:   Dorena Cookey MD   Signed by:   Dorena Cookey MD on 05/05/2010   Method used:   Print then Give to Patient   RxID:   [id number] LANTUS 100 UNIT/ML SOLN (INSULIN GLARGINE) use 100 units at bedtime  #3 vials x 6   Entered and Authorized by:   Dorena Cookey MD   Signed by:   Dorena Cookey MD on 05/05/2010   Method used:   Electronically to        [facility]. [place] (retail)       [place]  [address]       Ph: [phone]       Fax: [phone]   RxID:   [id number] HCT 40-12.5 MG TABS (OLMESARTAN MEDOXOMIL-HCTZ) take one tab once daily  #100 x 3   Entered and Authorized by:   Dorena Cookey MD   Signed by:   Dorena Cookey MD on 05/05/2010   Method used:   Electronically to        [facility]. [place] (retail)       [place]  [address]       Ph: [phone]       Fax: [phone]   [id number] LABETALOL HCL 300 MG TABS (LABETALOL HCL) 2 by mouth two times a day  #200 Each x 3   Entered and Authorized by:   Dorena Cookey MD   Signed by:   Dorena Cookey MD on 05/05/2010   Method  used:   Electronically to        [facility]. [place] (retail)       [place]  [address]       Ph: [phone]       Fax: [phone]   [id number] HUMALOG 100 UNIT/ML SOLN (INSULIN LISPRO (HUMAN)) 15 to 17 units 3 times a day  #3 vials x 3   Entered and Authorized by:   Dorena Cookey MD   Signed by:   Dorena Cookey MD on 05/05/2010   Method used:   Electronically to        [facility]. [place] (retail)       [place]  [address]       Ph: [phone]       Fax: [phone]   [id number] FUROSEMIDE 40 MG TABS (FUROSEMIDE) take one tab every other day  #100 x 3   Entered and Authorized by:   Dorena Cookey MD    Signed by:   Dorena Cookey MD on 05/05/2010   Method used:   Electronically to        [facility]. [place] (retail)       [place]  [address]       Ph: [phone]       Fax: [phone]   RxIDWM:[id number] BD INSULIN SYRINGE MICROFINE 28G X 1/2" 1 ML MISC (INSULIN SYRINGE-NEEDLE U-100) 65u at bedtime  #100 x 3   Entered and Authorized by:   Dorena Cookey MD   Signed by:   Dorena Cookey MD on 05/05/2010   Method used:   Electronically to        [facility]. [place] (retail)       [place]  [address]       Ph: [phone]       Fax: [phone]   [id number] SIMVASTATIN 40 MG  TABS (SIMVASTATIN) once daily  #100 x 3   Entered and Authorized by:   Dorena Cookey MD   Signed by:   Dorena Cookey MD on 05/05/2010   Method used:   Electronically to        [facility]. [place] (retail)       [place]  [address]       Ph: [phone]       Fax: [phone]   [id number] GLUCOPHAGE 1000 MG  TABS (METFORMIN HCL) two times a day  #200 Each x 3   Entered and Authorized by:   Dorena Cookey MD   Signed by:   Dorena Cookey MD on 05/05/2010   Method used:   Electronically to        [facility]. [place] (retail)       [place]  [address]       Ph: [phone]       Fax: [phone]   RxID:   [id number] GLUCOTROL XL 10 MG  TB24 (GLIPIZIDE) two times a day  #200 Each x  3   Entered and Authorized by:   Dorena Cookey MD   Signed by:   Dorena Cookey MD on 05/05/2010   Method used:   Electronically to        [facility]. [place] (retail)       [place]  [address]       Ph: [phone]       Fax: [phone]   [id number]    Immunizations Administered:  Tetanus Vaccine:    Vaccine Type: Tdap    Site: right deltoid    Mfr: Gl[employer]oSmithK[employer]    Dose: 0.5 ml    Route: IM    Given by:  Clearnce Sorrel CMA    Exp. Date: 05/11/2012    Lot #: [id number]    VIS given: 07/08/08 version given May 05, 2010.    Laboratory Results   Urine Tests    Routine Urinalysis   Color: yellow Appearance: Clear Glucose: negative   (Normal Range: Negative) Bilirubin: negative   (Normal Range: Negative) Ketone: negative   (Normal Range: Negative) Spec. Gravity: 1.010   (Normal Range: 1.003-1.035) Blood: negative   (Normal Range: Negative) pH: 5.0   (Normal Range: 5.0-8.0) Protein: negative   (Normal Range: Negative) Urobilinogen: 0.2   (Normal Range: 0-1) Nitrite: negative   (Normal Range: Negative) Leukocyte Esterace: trace   (Normal Range: Negative)    Comments: Joyce Gross  May 05, 2010 1:42 PM

## 2010-09-20 NOTE — Miscellaneous (Signed)
Summary: eye exam  Clinical Lists Changes  Observations: Added new observation of EYES COMMENT: 08/2010 (08/30/2009 15:58) Added new observation of EYE EXAM BY: [facility] (08/30/2009 15:58) Added new observation of DMEYEEXMRES: diabetic retinopathy (08/30/2009 15:58) Added new observation of DIAB EYE EX: diabetic retinopathy (08/30/2009 15:58)      Diabetes Management History:      He says that he is not exercising regularly.    Diabetes Management Exam:    Eye Exam:       Eye Exam done elsewhere          Date: 08/30/2009          Results: diabetic retinopathy          Done by: harman  Diabetes Management Assessment/Plan:      The following lipid goals have been established for the patient: Total cholesterol goal of 200; LDL cholesterol goal of 70; HDL cholesterol goal of 40; Triglyceride goal of 150.

## 2010-09-20 NOTE — Progress Notes (Signed)
  Phone Note Outgoing Call   Summary of Call: I called Bill to review his lab work GFR is slightly improved in 48, hemoglobin A1c, down from 7.7 in September to 7.0, now, which shows improvement in his overall blood sugar control.  However, potassium still elevated.  I will send him a copy of all the data and have him take it to his nephrologist next week Initial call taken by: Dorena Cookey MD,  July 28, 2010 1:39 PM

## 2010-09-20 NOTE — Progress Notes (Signed)
Summary: benicar  Phone Note Call from Patient   Summary of Call: Kent Osborne is aware of his labs.  he is taking half a tab of benicar daily.  his blood pressure is elevated in the afternoons.  he would like to know if he should start taking half tab of benicar in the afternoon. Initial call taken by: Clearnce Sorrel CMA,  May 18, 2010 10:37 AM  Follow-up for Phone Call        ok Follow-up by: Dorena Cookey MD,  May 19, 2010 8:39 AM  Additional Follow-up for Phone Call Additional follow up Details #1::        left message on machine for patient  Additional Follow-up by: Westley Hummer CMA Deborra Medina),  May 19, 2010 9:38 AM

## 2010-09-20 NOTE — Progress Notes (Signed)
Summary: rx question  Phone Note Call from Patient   Summary of Call: patient is calling because the lab called him last night to inform him that his potassium was elevated and that he should stop his blood pressure medication.  should the patient start taking his blood pressure medication again? Initial call taken by: Westley Hummer CMA Deborra Medina),  Dec 24, 2009 11:44 AM  Follow-up for Phone Call        continue your current medications.  Restart the simvastatin return in two weeks for recheck Follow-up by: Dorena Cookey MD,  Dec 24, 2009 1:50 PM

## 2010-09-20 NOTE — Miscellaneous (Signed)
Summary: dm eye exam  Clinical Lists Changes  Observations: Added new observation of EYES COMMENT: 10/2010 (10/11/2009 8:44) Added new observation of EYE EXAM BY: [facility] (10/08/2009 8:44) Added new observation of DMEYEEXMRES: diabetic retinopathy (10/08/2009 8:44) Added new observation of DIAB EYE EX: diabetic retinopathy (10/08/2009 8:44)      Diabetes Management History:      He says that he is not exercising regularly.    Diabetes Management Exam:    Eye Exam:       Eye Exam done elsewhere          Date: 10/08/2009          Results: diabetic retinopathy          Done by: [facility]  Diabetes Management Assessment/Plan:      The following lipid goals have been established for the patient: Total cholesterol goal of 200; LDL cholesterol goal of 70; HDL cholesterol goal of 40; Triglyceride goal of 150.

## 2010-09-20 NOTE — Progress Notes (Signed)
Summary: Fax copy of meds  Phone Note Call from Patient   Caller: Patient Summary of Call: Pt wants a copy of his meds list faxed to [phone] to take to the other doctor Initial call taken by: Clearnce Sorrel CMA,  January 20, 2010 11:18 AM  Follow-up for Phone Call        Called pt he got fax of meds Follow-up by: Clearnce Sorrel CMA,  January 20, 2010 2:29 PM

## 2010-09-20 NOTE — Assessment & Plan Note (Signed)
Summary: 2 week follow up/cjr   Vital Signs:  Patient profile:   60 year old male Weight:      299 pounds Temp:     97.6 degrees F oral BP sitting:   130 / 80  (left arm) Cuff size:   regular  Vitals Entered By: Westley Hummer CMA Deborra Medina) (Jan 06, 2010 8:52 AM) CC: follow-up visit   CC:  follow-up visit.  History of Present Illness:  Kent Osborne is a 60 year old male, who comes in today for evaluation of diabetes, hypertension, venous insufficiency.  His blood sugar fasting is now 100A1c acceptable.  BP 130/80.  He is on Lasix 40 mg every other day for fluid retention in his legs are still swollen area.  We will increase to 40 mg daily.  However, the last time we went up on his Lasix.  It put them in the renal failure, so we have to monitor his renal function very carefully.  He takes Vicodin 7.5 mg t.i.d. for chronic degenerative lumbar disk disease.  It does seem to be lasting as long he wants to know if we can increase the dose.  Allergies: No Known Drug Allergies  Past History:  Past medical, surgical, family and social histories (including risk factors) reviewed for relevance to current acute and chronic problems.  Past Medical History: Reviewed history from 10/08/2008 and no changes required. Hypertension OBESE Diabetes mellitus, type II Nephrolithiasis, hx of Coronary artery disease Dec 31, 2007 admitted to [facility] at stents placed in the circumflex and his LAD Hyperlipidemia Diabetes mellitus, type I Osteoarthritis Asthma  Past Surgical History: Reviewed history from 02/21/2007 and no changes required. Ashok Croon  Family History: Reviewed history from 01/06/2008 and no changes required. Family History Diabetes 1st degree relative Family History High cholesterol Family History Hypertension  Social History: Reviewed history from 05/26/2009 and no changes required. Single Never Smoked Alcohol use-no Regular  exercise-no Retired..........disabled because of underlying hypertension, diabetes, renal insufficiency, and coronary artery disease  Review of Systems      See HPI  Physical Exam  General:  Well-developed,well-nourished,in no acute distress; alert,appropriate and cooperative throughout examination Extremities:  2+ left pedal edema and 2+ right pedal edema.     Impression & Recommendations:  Problem # 1:  PERIPHERAL EDEMA (ICD-782.3) Assessment Unchanged  His updated medication list for this problem includes:    Furosemide 40 Mg Tabs (Furosemide) .Marland Kitchen... Take one tab once daily    Benicar Hct 40-12.5 Mg Tabs (Olmesartan medoxomil-hctz) .Marland Kitchen... Take one tab once daily  Problem # 2:  DIABETES MELLITUS, TYPE II (ICD-250.00) Assessment: Improved  His updated medication list for this problem includes:    Glucotrol Xl 10 Mg Tb24 (Glipizide) .Marland Kitchen..Marland Kitchen Two times a day    Glucophage 1000 Mg Tabs (Metformin hcl) .Marland Kitchen..Marland Kitchen Two times a day    Adult Aspirin Ec Low Strength 81 Mg Tbec (Aspirin) ..... Once daily    Lantus 100 Unit/ml Soln (Insulin glargine) .Marland KitchenMarland KitchenMarland KitchenMarland Kitchen 100  uqhs    Humalog 100 Unit/ml Soln (Insulin lispro (human)) .Marland KitchenMarland KitchenMarland KitchenMarland Kitchen 30 units once daily  using sliding scale    Benicar Hct 40-12.5 Mg Tabs (Olmesartan medoxomil-hctz) .Marland Kitchen... Take one tab once daily  Problem # 3:  HYPERTENSION (ICD-401.9) Assessment: Improved  His updated medication list for this problem includes:    Furosemide 40 Mg Tabs (Furosemide) .Marland Kitchen... Take one tab once daily    Labetalol Hcl 300 Mg Tabs (Labetalol hcl) .Marland Kitchen... 2 by mouth two times a day    Benicar Hct  40-12.5 Mg Tabs (Olmesartan medoxomil-hctz) .Marland Kitchen... Take one tab once daily  Orders: Venipuncture [id number]) TLB-BMP (Basic Metabolic Panel-BMET) ([id number])  Problem # 4:  BACK PAIN, CHRONIC (ICD-724.5) Assessment: Deteriorated  The following medications were removed from the medication list:    Vicodin Es 7.5-750 Mg Tabs (Hydrocodone-acetaminophen) .Marland Kitchen... Take 1 tablet  by mouth three times a day His updated medication list for this problem includes:    Flexeril 10 Mg Tabs (Cyclobenzaprine hcl)    Adult Aspirin Ec Low Strength 81 Mg Tbec (Aspirin) ..... Once daily    Vicodin Hp 10-660 Mg Tabs (Hydrocodone-acetaminophen) .Marland Kitchen... Take 1 tablet by mouth three times a day  Complete Medication List: 1)  Flexeril 10 Mg Tabs (Cyclobenzaprine hcl) 2)  Glucotrol Xl 10 Mg Tb24 (Glipizide) .... Two times a day 3)  Glucophage 1000 Mg Tabs (Metformin hcl) .... Two times a day 4)  Simvastatin 40 Mg Tabs (Simvastatin) .... Once daily - stopped- 5)  Adult Aspirin Ec Low Strength 81 Mg Tbec (Aspirin) .... Once daily 6)  Cetp Inhibitor  7)  Lantus 100 Unit/ml Soln (Insulin glargine) .Marland Kitchen.. 100  uqhs 8)  Bd Insulin Syringe Microfine 28g X 1/2" 1 Ml Misc (Insulin syringe-needle u-100) .... 65u at bedtime 9)  Furosemide 40 Mg Tabs (Furosemide) .... Take one tab once daily 10)  Humalog 100 Unit/ml Soln (Insulin lispro (human)) .... 30 units once daily  using sliding scale 11)  Labetalol Hcl 300 Mg Tabs (Labetalol hcl) .... 2 by mouth two times a day 12)  Benicar Hct 40-12.5 Mg Tabs (Olmesartan medoxomil-hctz) .... Take one tab once daily 13)  Effient 10 Mg Tabs (Prasugrel hcl) .... Take one tab once daily 14)  Prednisone 20 Mg Tabs (Prednisone) .... Uad 15)  Hydromet 5-1.5 Mg/20ml Syrp (Hydrocodone-homatropine) .... 1/2 to 1 tsp at bedtime 16)  Biaxin 500 Mg Tabs (Clarithromycin) .... Take one tab by mouth two times a day 17)  Vicodin Hp 10-660 Mg Tabs (Hydrocodone-acetaminophen) .... Take 1 tablet by mouth three times a day  Patient Instructions: 1)  increase the Vicodin to 10 mg 3 times a day or take a half a tablet 4 to 5 times a day. 2)  Increase the Lasix to 40 mg daily. 3)  Continue other medications. 4)  Return in two weeks for follow-up Prescriptions: VICODIN HP 10-660 MG TABS (HYDROCODONE-ACETAMINOPHEN) Take 1 tablet by mouth three times a day  #100 x 1   Entered and  Authorized by:   Dorena Cookey MD   Signed by:   Dorena Cookey MD on 01/06/2010   Method used:   Print then Give to Patient   RxID:   [id number] FUROSEMIDE 40 MG TABS (FUROSEMIDE) take one tab once daily  #100 x 3   Entered and Authorized by:   Dorena Cookey MD   Signed by:   Dorena Cookey MD on 01/06/2010   Method used:   Electronically to        [facility]. [place] (retail)       [place]  [address]       Ph: [phone]       Fax: [phone]   RxID:   [id number]

## 2010-09-20 NOTE — Assessment & Plan Note (Signed)
Summary: lump on back of leg/sore/cjr   Vital Signs:  Patient profile:   60 year old male Weight:      300 pounds Temp:     98.1 degrees F oral BP sitting:   130 / 70  (left arm) Cuff size:   regular  Vitals Entered By: Westley Hummer CMA Deborra Medina) (Jan 14, 2010 1:35 PM) CC: knot on leg Is Patient Diabetic? Yes   CC:  knot on leg.  History of Present Illness: Kent Osborne is a 60 year old single male, who comes in today for evaluation of a painful, swollen knot on his posterior right thigh.  Three days ago he noticed some redness and soreness.  There.  Today the swelling got worse and the pain got, worse.  It's also been warm and hot.  No history of trauma  Allergies: No Known Drug Allergies  Past History:  Past medical, surgical, family and social histories (including risk factors) reviewed for relevance to current acute and chronic problems.  Past Medical History: Reviewed history from 10/08/2008 and no changes required. Hypertension OBESE Diabetes mellitus, type II Nephrolithiasis, hx of Coronary artery disease Dec 31, 2007 admitted to [facility] at stents placed in the circumflex and his LAD Hyperlipidemia Diabetes mellitus, type I Osteoarthritis Asthma  Past Surgical History: Reviewed history from 02/21/2007 and no changes required. Ashok Croon  Family History: Reviewed history from 01/06/2008 and no changes required. Family History Diabetes 1st degree relative Family History High cholesterol Family History Hypertension  Social History: Reviewed history from 05/26/2009 and no changes required. Single Never Smoked Alcohol use-no Regular exercise-no Retired..........disabled because of underlying hypertension, diabetes, renal insufficiency, and coronary artery disease  Review of Systems      See HPI  Physical Exam  General:  Well-developed,well-nourished,in no acute distress; alert,appropriate and cooperative throughout examination Skin:  marble  sized boil right posterior thigh   Problems:  Medical Problems Added: 1)  Dx of Cellulitis, Right Leg  (ICD-682.6)  Impression & Recommendations:  Problem # 1:  CELLULITIS, RIGHT LEG (ICD-682.6) Assessment New  The following medications were removed from the medication list:    Biaxin 500 Mg Tabs (Clarithromycin) .Marland Kitchen... Take one tab by mouth two times a day His updated medication list for this problem includes:    Keflex 500 Mg Caps (Cephalexin) .Marland Kitchen... 2 by mouth two times a day  Complete Medication List: 1)  Flexeril 10 Mg Tabs (Cyclobenzaprine hcl) 2)  Glucotrol Xl 10 Mg Tb24 (Glipizide) .... Two times a day 3)  Glucophage 1000 Mg Tabs (Metformin hcl) .... Two times a day 4)  Simvastatin 40 Mg Tabs (Simvastatin) .... Once daily - stopped- 5)  Adult Aspirin Ec Low Strength 81 Mg Tbec (Aspirin) .... Once daily 6)  Cetp Inhibitor  7)  Lantus 100 Unit/ml Soln (Insulin glargine) .Marland Kitchen.. 100  uqhs 8)  Bd Insulin Syringe Microfine 28g X 1/2" 1 Ml Misc (Insulin syringe-needle u-100) .... 65u at bedtime 9)  Furosemide 40 Mg Tabs (Furosemide) .... Take one tab once daily 10)  Humalog 100 Unit/ml Soln (Insulin lispro (human)) .... 30 units once daily  using sliding scale 11)  Labetalol Hcl 300 Mg Tabs (Labetalol hcl) .... 2 by mouth two times a day 12)  Benicar Hct 40-12.5 Mg Tabs (Olmesartan medoxomil-hctz) .... Take one tab once daily 13)  Effient 10 Mg Tabs (Prasugrel hcl) .... Take one tab once daily 14)  Vicodin Hp 10-660 Mg Tabs (Hydrocodone-acetaminophen) .... Take 1 tablet by mouth three times a day 15)  Keflex 500 Mg Caps (Cephalexin) .... 2 by mouth two times a day  Patient Instructions: 1)  apply warm soaks 15 minutes 4 times a day begin Keflex two tabs b.i.d. return p.r.n. Prescriptions: KEFLEX 500 MG CAPS (CEPHALEXIN) 2 by mouth two times a day  #60 x 1   Entered and Authorized by:   Dorena Cookey MD   Signed by:   Dorena Cookey MD on 01/14/2010   Method used:    Electronically to        [facility]. [place] (retail)       [place]  [address]       Ph: [phone]       Fax: [phone]   RxID:   [id number]

## 2010-09-20 NOTE — Miscellaneous (Signed)
Summary: dm eye exam  Clinical Lists Changes  Observations: Added new observation of EYES COMMENT: 03/2011 (03/09/2010 10:12) Added new observation of EYE EXAM BY: [facility] (03/07/2010 10:13) Added new observation of DMEYEEXMRES: normal (03/07/2010 10:13) Added new observation of DIAB EYE EX: normal (03/07/2010 10:13)      Diabetes Management History:      He says that he is not exercising regularly.    Diabetes Management Exam:    Eye Exam:       Eye Exam done elsewhere          Date: 03/07/2010          Results: normal          Done by: [facility]  Diabetes Management Assessment/Plan:      The following lipid goals have been established for the patient: Total cholesterol goal of 200; LDL cholesterol goal of 70; HDL cholesterol goal of 40; Triglyceride goal of 150.

## 2010-09-22 NOTE — Assessment & Plan Note (Signed)
Summary: asthma flare up/njr   Vital Signs:  Patient profile:   60 year old male Weight:      300 pounds Temp:     98.8 degrees F oral BP sitting:   118 / 72  (left arm) Cuff size:   large  Vitals Entered By: Cay Schillings LPN (December 27, 624THL 9:49 AM) CC: c/o head and chest congestion, ??asthma flare     fbs 105 Is Patient Diabetic? Yes Did you bring your meter with you today? No   CC:  c/o head and chest congestion and ??asthma flare     fbs 105.  History of Present Illness: 38 -year-old patient who has a history of coronary artery disease, asthma, type 2 diabetes.  For the past week.  She has had increasing head and chest congestion, cough and wheezing.  He states that he is response to oral prednisone.  He has been taking Mucinex.  Cough is nonproductive.  There is been no fever or chills or significant shortness of breath.  He has maintained very nice glycemic control  Allergies (verified): No Known Drug Allergies  Past History:  Past Medical History: Reviewed history from 10/08/2008 and no changes required. Hypertension OBESE Diabetes mellitus, type II Nephrolithiasis, hx of Coronary artery disease Dec 31, 2007 admitted to [facility] at stents placed in the circumflex and his LAD Hyperlipidemia Diabetes mellitus, type I Osteoarthritis Asthma  Review of Systems       The patient complains of anorexia, hoarseness, and prolonged cough.  The patient denies fever, weight loss, weight gain, vision loss, decreased hearing, chest pain, syncope, dyspnea on exertion, peripheral edema, headaches, hemoptysis, abdominal pain, melena, hematochezia, severe indigestion/heartburn, hematuria, incontinence, genital sores, muscle weakness, suspicious skin lesions, transient blindness, difficulty walking, depression, unusual weight change, abnormal bleeding, enlarged lymph nodes, angioedema, breast masses, and testicular masses.    Physical Exam  General:   overweight-appearing.  normal blood pressureoverweight-appearing.   Head:  Normocephalic and atraumatic without obvious abnormalities. No apparent alopecia or balding. Eyes:  No corneal or conjunctival inflammation noted. EOMI. Perrla. Funduscopic exam benign, without hemorrhages, exudates or papilledema. Vision grossly normal. Ears:  External ear exam shows no significant lesions or deformities.  Otoscopic examination reveals clear canals, tympanic membranes are intact bilaterally without bulging, retraction, inflammation or discharge. Hearing is grossly normal bilaterally. Nose:  External nasal examination shows no deformity or inflammation. Nasal mucosa are pink and moist without lesions or exudates. Mouth:  pharyngeal crowding.  pharyngeal crowding.   Neck:  No deformities, masses, or tenderness noted. Lungs:  Normal respiratory effort, chest expands symmetrically. Lungs are clear to auscultation, no crackles or wheezes.  normal O2 sat Heart:  Normal rate and regular rhythm. S1 and S2 normal without gallop, murmur, click, rub or other extra sounds.   Abdomen:  Bowel sounds positive,abdomen soft and non-tender without masses, organomegaly or hernias noted.   Impression & Recommendations:  Problem # 1:  URI (ICD-465.9)  His updated medication list for this problem includes:    Adult Aspirin Ec Low Strength 81 Mg Tbec (Aspirin) ..... Once daily    Hydrocodone-homatropine 5-1.5 Mg/86ml Syrp (Hydrocodone-homatropine) .Marland Kitchen... 1 teaspoon every 6 hours for cough  His updated medication list for this problem includes:    Adult Aspirin Ec Low Strength 81 Mg Tbec (Aspirin) ..... Once daily    Hydrocodone-homatropine 5-1.5 Mg/62ml Syrp (Hydrocodone-homatropine) .Marland Kitchen... 1 teaspoon every 6 hours for cough  Problem # 2:  ASTHMA (ICD-493.90)  His updated medication list for this  problem includes:    Prednisone 10 Mg Tabs (Prednisone) ..... One twice daily for 7 days  His updated medication list for  this problem includes:    Prednisone 10 Mg Tabs (Prednisone) ..... One twice daily for 7 days  Complete Medication List: 1)  Flexeril 10 Mg Tabs (Cyclobenzaprine hcl) .... One every 8 hour as needed 2)  Glucotrol Xl 10 Mg Tb24 (Glipizide) .... Two times a day 3)  Glucophage 1000 Mg Tabs (Metformin hcl) .... Two times a day 4)  Simvastatin 40 Mg Tabs (Simvastatin) .... Once daily 5)  Adult Aspirin Ec Low Strength 81 Mg Tbec (Aspirin) .... Once daily 6)  Cetp Inhibitor  7)  Bd Insulin Syringe Microfine 28g X 1/2" 1 Ml Misc (Insulin syringe-needle u-100) .... 65u at bedtime 8)  Furosemide 40 Mg Tabs (Furosemide) .... Take one tab every other day 9)  Humalog 100 Unit/ml Soln (Insulin lispro (human)) .Marland Kitchen.. 15 to 17 units 3 times a day 10)  Labetalol Hcl 300 Mg Tabs (Labetalol hcl) .... 2 by mouth two times a day 11)  Benicar Hct 40-12.5 Mg Tabs (Olmesartan medoxomil-hctz) .... Take 1/2 tablet by mouth in themorning and 1/2 tab in the evening 12)  Effient 10 Mg Tabs (Prasugrel hcl) .... Take one tab once daily 13)  Vicodin Hp 10-660 Mg Tabs (Hydrocodone-acetaminophen) .... Take 1 tablet by mouth three times a day 14)  Lantus 100 Unit/ml Soln (Insulin glargine) .... Use 100 units at bedtime 15)  Colcrys 0.6 Mg Tabs (Colchicine) .... Take 1 tablet by mouth every morning 16)  Allopurinol 300 Mg Tabs (Allopurinol) .... Take 1 tablet by mouth every morning 17)  Prednisone 10 Mg Tabs (Prednisone) .... One twice daily for 7 days 18)  Hydrocodone-homatropine 5-1.5 Mg/18ml Syrp (Hydrocodone-homatropine) .Marland Kitchen.. 1 teaspoon every 6 hours for cough  Patient Instructions: 1)  Get plenty of rest, drink lots of clear liquids, and use Tylenol or Ibuprofen for fever and comfort. Return in 7-10 days if you're not better:sooner if you're feeling worse. Prescriptions: HYDROCODONE-HOMATROPINE 5-1.5 MG/5ML SYRP (HYDROCODONE-HOMATROPINE) 1 teaspoon every 6 hours for cough  #6 oz x 0   Entered and Authorized by:   Marletta Lor  MD   Signed by:   Marletta Lor  MD on 08/16/2010   Method used:   Print then Give to Patient   RxID:   [id number] PREDNISONE 10 MG TABS (PREDNISONE) one twice daily for 7 days  #14 x 0   Entered and Authorized by:   Marletta Lor  MD   Signed by:   Marletta Lor  MD on 08/16/2010   Method used:   Print then Give to Patient   RxID:   [id number]    Orders Added: 1)  Est. Patient Level III [id number]

## 2010-09-22 NOTE — Letter (Signed)
Summary: Referral - not able to see patient  [facility]  [place], [place] [address]   Phone: [phone]  Fax: [phone]    August 19, 2010   Dellis Filbert A. Sherren Mocha, M.D. [address] [place], [place] [address]    Re:   HALVOR RILLEY DOB:  June 08, 1951 MRN:   [id number]    Dear Dr. Sherren Mocha:  Thank you for your kind referral of the above patient.  We have attempted to schedule the recommended procedure Screening Colonoscopy but have not been able to schedule because:   X  The patient was not available by phone and/or has not returned our calls.  ___ The patient declined to schedule the procedure at this time.  We appreciate the referral and hope that we will have the opportunity to treat this patient in the future.    Sincerely,    [facility] [phone]

## 2010-09-26 ENCOUNTER — Telehealth: Payer: Self-pay | Admitting: Family Medicine

## 2010-09-26 NOTE — Telephone Encounter (Signed)
Ok to call in

## 2010-09-26 NOTE — Telephone Encounter (Signed)
Pt stated that he never received his rx for Hydrocodone 10-660, on 09-19-2010. Please send in new rx to [facility] in [place]. Please return his call.

## 2010-09-28 ENCOUNTER — Other Ambulatory Visit: Payer: Self-pay | Admitting: *Deleted

## 2010-09-28 DIAGNOSIS — I10 Essential (primary) hypertension: Secondary | ICD-10-CM

## 2010-09-28 MED ORDER — LABETALOL HCL 300 MG PO TABS: 300.0000 mg | ORAL_TABLET | Freq: Two times a day (BID) | ORAL | Status: DC

## 2010-09-28 NOTE — Progress Notes (Signed)
Summary: REFILL REQUEST  Phone Note Refill Request Message from:  Patient on September 19, 2010 9:54 AM  Refills Requested: Medication #1:  VICODIN HP 10-660 MG TABS Take 1 tablet by mouth three times a day   Notes: [place]  Medication #2:  COLCRYS 0.6 MG TABS Take 1 tablet by mouth every morning   Notes: [place]    Initial call taken by: Duanne Moron,  September 19, 2010 9:54 AM  Follow-up for Phone Call        Vicodin DS dispensed 30, directions one half to one tablet nightly for back pain, refills x 3   colcx............for gout........ have any dizzy take?????? Follow-up by: Dorena Cookey MD,  September 19, 2010 11:16 AM  Additional Follow-up for Phone Call Additional follow up Details #1::        ?    Additional Follow-up for Phone Call Additional follow up Details #2::    rx called in Follow-up by: Westley Hummer CMA Deborra Medina),  September 20, 2010 10:52 AM  Prescriptions: COLCRYS 0.6 MG TABS (COLCHICINE) Take 1 tablet by mouth every morning  #90 x 3   Entered by:   Westley Hummer CMA ([place])   Authorized by:   Dorena Cookey MD   Signed by:   Westley Hummer CMA ([place]) on 09/20/2010   Method used:   Electronically to        [facility]. [place] (retail)       [place]  [address]       Ph: [phone] or [phone]       Fax: [phone]   RxID:   [id number]

## 2010-09-29 ENCOUNTER — Other Ambulatory Visit: Payer: Self-pay | Admitting: *Deleted

## 2010-09-29 DIAGNOSIS — I10 Essential (primary) hypertension: Secondary | ICD-10-CM

## 2010-09-29 MED ORDER — LABETALOL HCL 300 MG PO TABS: 300.0000 mg | ORAL_TABLET | Freq: Two times a day (BID) | ORAL | Status: DC

## 2010-10-06 ENCOUNTER — Other Ambulatory Visit: Payer: Self-pay | Admitting: *Deleted

## 2010-10-06 DIAGNOSIS — I10 Essential (primary) hypertension: Secondary | ICD-10-CM

## 2010-10-06 MED ORDER — LABETALOL HCL 300 MG PO TABS: ORAL_TABLET | ORAL | Status: DC

## 2010-10-17 ENCOUNTER — Telehealth: Payer: Self-pay | Admitting: Family Medicine

## 2010-10-17 NOTE — Telephone Encounter (Signed)
Left message on machine returning patient's call 

## 2010-10-17 NOTE — Telephone Encounter (Signed)
Pt wants to speak with Apolonio Schneiders, CMA ref to questions concerning meds.... Pt can be reached at [phone].

## 2010-10-19 ENCOUNTER — Telehealth: Payer: Self-pay | Admitting: *Deleted

## 2010-10-19 DIAGNOSIS — M549 Dorsalgia, unspecified: Secondary | ICD-10-CM

## 2010-10-19 MED ORDER — HYDROCODONE-ACETAMINOPHEN 10-660 MG PO TABS: 1.0000 | ORAL_TABLET | Freq: Three times a day (TID) | ORAL | Status: DC

## 2010-10-19 NOTE — Telephone Encounter (Signed)
rx called in and patient is aware

## 2010-10-19 NOTE — Telephone Encounter (Signed)
patient  Is calling because he only received #30 vicodin with his last refill.  He takes it three times a day.  Is this okay to fill #90?

## 2010-10-19 NOTE — Telephone Encounter (Signed)
Ok #90

## 2010-12-26 ENCOUNTER — Other Ambulatory Visit: Payer: Self-pay | Admitting: *Deleted

## 2010-12-26 MED ORDER — GLIPIZIDE 10 MG PO TABS: 10.0000 mg | ORAL_TABLET | Freq: Two times a day (BID) | ORAL | Status: DC

## 2011-01-03 NOTE — Assessment & Plan Note (Signed)
[place]                                 ON-CALL NOTE   NAME:ELLINGTONCartrell, Filardi                    MRN:          [id number]  DATE:05/26/2009                            DOB:          08-21-1951    PRIMARY CARE Verl Kitson:  Dellis Filbert A. Sherren Mocha, MD   Received a call from [facility], that the patient's potassium  was 6.1 without hemolysis.  While at the [facility] primary care meeting,  discussed the case with Dr. Sherren Mocha, his primary care Lasya Vetter, who told me  that he would call the patient after the meeting was over.     Kathryne Eriksson, MD    STC/MedQ  DD: 05/26/2009  DT: 05/27/2009  Job #: [id number]   cc:   Dellis Filbert A. Sherren Mocha, MD

## 2011-01-09 ENCOUNTER — Ambulatory Visit (INDEPENDENT_AMBULATORY_CARE_PROVIDER_SITE_OTHER): Payer: Medicare Other | Admitting: Internal Medicine

## 2011-01-09 ENCOUNTER — Encounter: Payer: Self-pay | Admitting: Internal Medicine

## 2011-01-09 VITALS — BP 140/70 | Temp 98.6°F | Ht 68.0 in | Wt 302.0 lb

## 2011-01-09 DIAGNOSIS — N419 Inflammatory disease of prostate, unspecified: Secondary | ICD-10-CM

## 2011-01-09 DIAGNOSIS — N182 Chronic kidney disease, stage 2 (mild): Secondary | ICD-10-CM

## 2011-01-09 DIAGNOSIS — R300 Dysuria: Secondary | ICD-10-CM

## 2011-01-09 DIAGNOSIS — R040 Epistaxis: Secondary | ICD-10-CM

## 2011-01-09 LAB — POCT URINALYSIS DIPSTICK
Bilirubin, UA: NEGATIVE
Ketones, UA: NEGATIVE
Spec Grav, UA: 1.025
pH, UA: 5

## 2011-01-09 MED ORDER — CIPROFLOXACIN HCL 500 MG PO TABS: 500.0000 mg | ORAL_TABLET | Freq: Two times a day (BID) | ORAL | Status: AC

## 2011-01-11 ENCOUNTER — Telehealth: Payer: Self-pay

## 2011-01-11 LAB — URINE CULTURE

## 2011-01-11 NOTE — Telephone Encounter (Signed)
Message copied by Kathlene November on Wed Jan 11, 2011  4:09 PM ------      Message from: Jeb Levering, Marcello Moores      Created: Wed Jan 11, 2011  2:21 PM       Did grow small amount of bacteria on culture. Antibiotic given should be strong enough to treat.

## 2011-01-11 NOTE — Telephone Encounter (Signed)
Pt.notified

## 2011-01-16 ENCOUNTER — Encounter: Payer: Self-pay | Admitting: Internal Medicine

## 2011-01-16 DIAGNOSIS — R040 Epistaxis: Secondary | ICD-10-CM | POA: Insufficient documentation

## 2011-01-16 DIAGNOSIS — N419 Inflammatory disease of prostate, unspecified: Secondary | ICD-10-CM | POA: Insufficient documentation

## 2011-01-16 NOTE — Assessment & Plan Note (Signed)
Stable. GFR approximately 48. No renal adjustment of antibiotic needed

## 2011-01-16 NOTE — Assessment & Plan Note (Signed)
Obtain UA and cx. Begin 10d course of cipro. Followup if no improvement or worsening.

## 2011-01-16 NOTE — Assessment & Plan Note (Signed)
Resolved. Attempt saline spray.

## 2011-01-16 NOTE — Progress Notes (Signed)
  Subjective:    Patient ID: Kent Osborne, male    DOB: 06-01-1951, 60 y.o.   MRN: [id number]  HPI Pt presents to clinic for evaluation of dysuria. Notes initial sx's approximately 3-4 days ago beginning with chills and ha now resolved. Developed dysuria with associated urgency and nocturia x 3/night. May have had associated mild back pain that is not positional.  Denies abd pain or cva pain. H/o DM with recent fsbs's ~120 without hypopglycemia and CRI with last cr ~ 1.6. Recalls epistaxis mild several days ago that resolved quickly with pressure. No further bleeding and does take effient with h/o CAD. No other complaints.  Reviewed past medical history, medications and allergies    Review of Systems see history of present illness     Objective:   Physical Exam    Physical Exam  Vitals reviewed. Constitutional:  appears well-developed and well-nourished. No distress.  HENT:  Head: Normocephalic and atraumatic.  Right Ear: Tympanic membrane, external ear and ear canal normal.  Left Ear: Tympanic membrane, external ear and ear canal normal.  Nose: Nose normal.  No sore wound ulceration or bleeding noted Mouth/Throat: Oropharynx is clear and moist. No oropharyngeal exudate.  Eyes: Conjunctivae and EOM are normal. Pupils are equal, round, and reactive to light. Right eye exhibits no discharge. Left eye exhibits no discharge. No scleral icterus.  Neck: Neck supple. No thyromegaly present.  Cardiovascular: Normal rate, regular rhythm and normal heart sounds.  Exam reveals no gallop and no friction rub.   No murmur heard. Pulmonary/Chest: Effort normal and breath sounds normal. No respiratory distress.  has no wheezes.  has no rales.  Lymphadenopathy:   no cervical adenopathy.  Neurological:  is alert.  Skin: Skin is warm and dry.  not diaphoretic.  Psychiatric: normal mood and affect.  Skeletal: No CVA tenderness    Assessment & Plan:

## 2011-02-06 LAB — HM DIABETES EYE EXAM

## 2011-02-08 ENCOUNTER — Other Ambulatory Visit: Payer: Self-pay | Admitting: Family Medicine

## 2011-02-09 NOTE — Telephone Encounter (Signed)
okay

## 2011-02-21 ENCOUNTER — Encounter: Payer: Self-pay | Admitting: Family Medicine

## 2011-02-21 ENCOUNTER — Ambulatory Visit (INDEPENDENT_AMBULATORY_CARE_PROVIDER_SITE_OTHER): Payer: Medicare Other | Admitting: Family Medicine

## 2011-02-21 VITALS — BP 130/90 | Temp 98.2°F | Wt 295.0 lb

## 2011-02-21 DIAGNOSIS — R300 Dysuria: Secondary | ICD-10-CM

## 2011-02-21 DIAGNOSIS — E109 Type 1 diabetes mellitus without complications: Secondary | ICD-10-CM

## 2011-02-21 DIAGNOSIS — N419 Inflammatory disease of prostate, unspecified: Secondary | ICD-10-CM

## 2011-02-21 LAB — POCT URINALYSIS DIPSTICK
Bilirubin, UA: NEGATIVE
Glucose, UA: NEGATIVE
Ketones, UA: NEGATIVE
Nitrite, UA: NEGATIVE
Spec Grav, UA: 1.01

## 2011-02-21 LAB — BASIC METABOLIC PANEL
Chloride: 108 mEq/L (ref 96–112)
GFR: 41.89 mL/min — ABNORMAL LOW (ref >60.00)
Potassium: 5.3 mEq/L — ABNORMAL HIGH (ref 3.5–5.1)

## 2011-02-21 MED ORDER — DOXYCYCLINE HYCLATE 100 MG PO TABS: ORAL_TABLET | ORAL | Status: DC

## 2011-02-21 NOTE — Progress Notes (Signed)
  Subjective:    Patient ID: Kent Osborne, male    DOB: [birth year], 60 y.o.   MRN: [id number]  HPIWilliam is a 60 year old, married male, nonsmoker, type I diabetic, who comes in today for right acute lesion of recurrent prostatitis.  He was treated a month ago with Septra b.i.d. For 10 days.  His symptoms abated after 3 or 4 days and he completed his therapy.  Now symptoms have recurred.  Blood sugar at home.  This morning 98    Review of Systems General and neurologic review of systems otherwise negative   Objective:   Physical Exam    Well-developed well-nourished, male in no acute distress    Assessment & Plan:  Prostatitis recurrent.  Plan therapeutic dose, then prophylactic dose daily for 3 weeks

## 2011-02-21 NOTE — Patient Instructions (Addendum)
Take  the antibiotics twice daily for two weeks, then one daily at bedtime for 3 weeks.  Continue current medications.  We will call you at the report on your A1c  Please fax lab results to [facility] at [phone]

## 2011-02-23 LAB — MICROALBUMIN / CREATININE URINE RATIO
Creatinine,U: 48.5 mg/dL
Microalb, Ur: 3 mg/dL — ABNORMAL HIGH (ref 0.0–1.9)

## 2011-02-24 NOTE — Progress Notes (Signed)
patient  Is aware and lab appointment made

## 2011-03-29 ENCOUNTER — Other Ambulatory Visit: Payer: Self-pay | Admitting: Family Medicine

## 2011-04-22 ENCOUNTER — Other Ambulatory Visit: Payer: Self-pay | Admitting: Family Medicine

## 2011-05-06 ENCOUNTER — Other Ambulatory Visit: Payer: Self-pay | Admitting: Family Medicine

## 2011-05-12 ENCOUNTER — Other Ambulatory Visit: Payer: Self-pay | Admitting: Family Medicine

## 2011-05-19 ENCOUNTER — Encounter: Payer: Self-pay | Admitting: Family Medicine

## 2011-05-29 ENCOUNTER — Other Ambulatory Visit (INDEPENDENT_AMBULATORY_CARE_PROVIDER_SITE_OTHER): Payer: Medicare Other

## 2011-05-29 DIAGNOSIS — E119 Type 2 diabetes mellitus without complications: Secondary | ICD-10-CM

## 2011-05-29 DIAGNOSIS — Z23 Encounter for immunization: Secondary | ICD-10-CM

## 2011-05-29 LAB — BASIC METABOLIC PANEL
Calcium: 9 mg/dL (ref 8.4–10.5)
Creatinine, Ser: 1.8 mg/dL — ABNORMAL HIGH (ref 0.4–1.5)
GFR: 42.68 mL/min — ABNORMAL LOW (ref >60.00)
Glucose, Bld: 107 mg/dL — ABNORMAL HIGH (ref 70–99)
Sodium: 145 mEq/L (ref 135–145)

## 2011-06-05 ENCOUNTER — Other Ambulatory Visit: Payer: Self-pay | Admitting: Family Medicine

## 2011-06-16 ENCOUNTER — Other Ambulatory Visit: Payer: Self-pay | Admitting: *Deleted

## 2011-06-16 MED ORDER — PREDNISONE 5 MG PO TABS: 5.0000 mg | ORAL_TABLET | Freq: Every day | ORAL | Status: AC

## 2011-07-14 ENCOUNTER — Other Ambulatory Visit: Payer: Self-pay | Admitting: Family Medicine

## 2011-07-25 ENCOUNTER — Other Ambulatory Visit: Payer: Self-pay | Admitting: Family Medicine

## 2011-10-11 ENCOUNTER — Other Ambulatory Visit: Payer: Self-pay | Admitting: Family Medicine

## 2011-10-12 ENCOUNTER — Encounter: Payer: Self-pay | Admitting: Family Medicine

## 2011-10-12 ENCOUNTER — Ambulatory Visit (INDEPENDENT_AMBULATORY_CARE_PROVIDER_SITE_OTHER): Payer: Medicare Other | Admitting: Family Medicine

## 2011-10-12 DIAGNOSIS — E119 Type 2 diabetes mellitus without complications: Secondary | ICD-10-CM

## 2011-10-12 DIAGNOSIS — J45909 Unspecified asthma, uncomplicated: Secondary | ICD-10-CM

## 2011-10-12 DIAGNOSIS — G8929 Other chronic pain: Secondary | ICD-10-CM

## 2011-10-12 DIAGNOSIS — E109 Type 1 diabetes mellitus without complications: Secondary | ICD-10-CM

## 2011-10-12 MED ORDER — HYDROCODONE-HOMATROPINE 5-1.5 MG/5ML PO SYRP: 5.0000 mL | ORAL_SOLUTION | Freq: Three times a day (TID) | ORAL | Status: AC | PRN

## 2011-10-12 MED ORDER — LABETALOL HCL 300 MG PO TABS: ORAL_TABLET | ORAL | Status: DC

## 2011-10-12 MED ORDER — PREDNISONE 20 MG PO TABS: ORAL_TABLET | ORAL | Status: DC

## 2011-10-12 MED ORDER — AMOXICILLIN 500 MG PO CAPS: 500.0000 mg | ORAL_CAPSULE | Freq: Three times a day (TID) | ORAL | Status: AC

## 2011-10-12 MED ORDER — HYDROCODONE-ACETAMINOPHEN 10-660 MG PO TABS: ORAL_TABLET | ORAL | Status: DC

## 2011-10-12 NOTE — Progress Notes (Signed)
  Subjective:    Patient ID: Kent Osborne, male    DOB: 18-Aug-1951, 61 y.o.   MRN: [id number]  HPI Kent Osborne is a 61 year old male single nonsmoker who comes in today for evaluation of her cough  He developed a viral syndrome or 5 days ago and 3 days ago began wheezing. He has a history of asthma.  No fever minimal sputum production  His blood sugar is in the 105 range. He's currently taking metformin 1000 mg twice a day, Glucotrol 10 mg twice a day, and Lantus 100 units at bedtime. He states that his new nephrologist asked him to stop the metformin but I'm not sure why. I recommend he continue the metformin. He's also complaining of severe facial pain.    Review of Systems general pulmonary metabolic review of systems otherwise negative    general pulmonary metabolic review of systems otherwise negative Objective:   Physical Exam  Well-developed well-nourished male in no acute distress HEENT negative neck was supple no adenopathy lungs showed symmetrical breath sounds moderate bilateral expiratory wheezing      Assessment & Plan:  Viral syndrome with secondary asthma and severe facial pain  Increase water intake, prednisone burst and taper, amoxicillin 500 mg 3 times daily, monitor blood sugar carefully, regular insulin when necessary

## 2011-10-12 NOTE — Patient Instructions (Signed)
Take the amoxicillin 500 mg 3 times daily  Take the prednisone as directed  Drink lots of water  Hydromet 1/2-1 teaspoon 3 times daily when necessary for cough and cold  Rest at home for the next couple of days  Monitor your blood sugar take regular insulin when necessary if your blood sugars over 200  Return when necessary

## 2011-10-19 ENCOUNTER — Other Ambulatory Visit: Payer: Self-pay | Admitting: *Deleted

## 2011-10-19 MED ORDER — SIMVASTATIN 40 MG PO TABS: 40.0000 mg | ORAL_TABLET | Freq: Every day | ORAL | Status: DC

## 2011-11-22 ENCOUNTER — Other Ambulatory Visit: Payer: Self-pay | Admitting: *Deleted

## 2011-11-22 MED ORDER — OLMESARTAN MEDOXOMIL-HCTZ 40-12.5 MG PO TABS: 1.0000 | ORAL_TABLET | Freq: Every day | ORAL | Status: DC

## 2012-01-02 ENCOUNTER — Other Ambulatory Visit: Payer: Self-pay | Admitting: *Deleted

## 2012-01-02 MED ORDER — GLIPIZIDE 10 MG PO TABS: 10.0000 mg | ORAL_TABLET | Freq: Two times a day (BID) | ORAL | Status: DC

## 2012-01-17 ENCOUNTER — Other Ambulatory Visit: Payer: Self-pay | Admitting: Family Medicine

## 2012-02-05 ENCOUNTER — Encounter: Payer: Self-pay | Admitting: Family Medicine

## 2012-02-05 ENCOUNTER — Ambulatory Visit (INDEPENDENT_AMBULATORY_CARE_PROVIDER_SITE_OTHER): Payer: Medicare Other | Admitting: Family Medicine

## 2012-02-05 VITALS — BP 160/84 | Temp 98.5°F | Wt 319.0 lb

## 2012-02-05 DIAGNOSIS — G8929 Other chronic pain: Secondary | ICD-10-CM

## 2012-02-05 DIAGNOSIS — R609 Edema, unspecified: Secondary | ICD-10-CM

## 2012-02-05 DIAGNOSIS — N182 Chronic kidney disease, stage 2 (mild): Secondary | ICD-10-CM

## 2012-02-05 DIAGNOSIS — E109 Type 1 diabetes mellitus without complications: Secondary | ICD-10-CM

## 2012-02-05 DIAGNOSIS — M549 Dorsalgia, unspecified: Secondary | ICD-10-CM

## 2012-02-05 LAB — HM DIABETES EYE EXAM

## 2012-02-05 MED ORDER — HYDROCODONE-ACETAMINOPHEN 10-660 MG PO TABS: ORAL_TABLET | ORAL | Status: DC

## 2012-02-05 NOTE — Patient Instructions (Signed)
Take an extra diuretic Monday Wednesday Friday  Keep her legs elevated as much as possible and [place] complete salt free diet  The pain medication as needed for your back  Increase the regular insulin to 30 units before each meal continue the Lantus 100 units at bedtime  When you return in 3 weeks prerequisite of all your blood sugar readings

## 2012-02-05 NOTE — Progress Notes (Signed)
  Subjective:    Patient ID: Kent Osborne, male    DOB: Nov 25, 1950, 61 y.o.   MRN: [id number]  HPI W.  61 year old male who comes in today for evaluation of multiple issues  He has chronic back pain and needs a refill on his pain medication for which she takes hydrocodone 10 mg 3 times a day when necessary  He has hypertension his blood pressure now is 160/84  His nephrologist stopped his metformin a week ago and now his blood sugars in the 150-250 range. He's increased his sliding scale of insulin before each meal and kept his Lantus the same and 100 units a day at bedtime. He is amazed at how much that metformin kept his blood sugar back to normal. However his GFR was going up and his nephrologist once and stay off of that metformin for 6 months.  He also has 2 lesions on his lower extremities the won't heal. His nephrologist changed his diuretic from Lasix to chlorthalidone 25 mg daily   Review of Systems Gen. metabolic review of systems otherwise negative    Objective:   Physical Exam  Well-developed well-nourished male in no acute distress examination of lower extremities shows 2 dime-sized lesion that are slowly healing no evidence of secondary infection 2+ edema      Assessment & Plan:  Diabetes type 1 plan sliding-scale 30 units before each meal continue Lantus 100 units at bedtime followup in 3 weeks #2 declining GFR hold metformin followup labs in 3 weeks  Sores lower extremities elevation cleaning increase the diuretic slightly to try to decrease the venous insufficiency and peripheral edema which inhibit healing.  Chronic pain r refill medication

## 2012-02-08 ENCOUNTER — Telehealth: Payer: Self-pay | Admitting: *Deleted

## 2012-02-08 MED ORDER — HYDROCODONE-HOMATROPINE 5-1.5 MG/5ML PO SYRP: 5.0000 mL | ORAL_SOLUTION | Freq: Three times a day (TID) | ORAL | Status: AC | PRN

## 2012-02-08 NOTE — Telephone Encounter (Signed)
Hydromet 4 ounces directions 1/2-1 teaspoon at bedtime when necessary for cough refills x1

## 2012-02-08 NOTE — Telephone Encounter (Signed)
Patient is calling because he has a cough.  He has started his prednisone but would like some cough syrup called in so he can rest at night.  Is this okay to call in?

## 2012-02-08 NOTE — Telephone Encounter (Signed)
Rx called in 

## 2012-02-20 ENCOUNTER — Encounter: Payer: Self-pay | Admitting: Family Medicine

## 2012-02-29 ENCOUNTER — Encounter: Payer: Self-pay | Admitting: Family Medicine

## 2012-02-29 ENCOUNTER — Ambulatory Visit (INDEPENDENT_AMBULATORY_CARE_PROVIDER_SITE_OTHER): Payer: Medicare Other | Admitting: Family Medicine

## 2012-02-29 VITALS — BP 110/80 | Temp 97.9°F | Wt 320.0 lb

## 2012-02-29 DIAGNOSIS — N289 Disorder of kidney and ureter, unspecified: Secondary | ICD-10-CM

## 2012-02-29 DIAGNOSIS — E109 Type 1 diabetes mellitus without complications: Secondary | ICD-10-CM

## 2012-02-29 DIAGNOSIS — I10 Essential (primary) hypertension: Secondary | ICD-10-CM

## 2012-02-29 LAB — POCT URINALYSIS DIPSTICK
Blood, UA: NEGATIVE
Ketones, UA: NEGATIVE
Protein, UA: NEGATIVE
Spec Grav, UA: 1.01
pH, UA: 5.5

## 2012-02-29 LAB — MICROALBUMIN / CREATININE URINE RATIO
Creatinine,U: 67.3 mg/dL
Microalb, Ur: 0.5 mg/dL (ref 0.0–1.9)

## 2012-02-29 LAB — BASIC METABOLIC PANEL
BUN: 60 mg/dL — ABNORMAL HIGH (ref 6–23)
Creatinine, Ser: 2.6 mg/dL — ABNORMAL HIGH (ref 0.4–1.5)
GFR: 26.84 mL/min — ABNORMAL LOW (ref >60.00)

## 2012-02-29 NOTE — Patient Instructions (Addendum)
Continue your current medications  I will call you on additional lab work back if we have to adjust any of your medications then we can do that by phone and arrange followup also

## 2012-02-29 NOTE — Progress Notes (Signed)
  Subjective:    Patient ID: THADE SLEDZ, male    DOB: 23-Apr-1951, 61 y.o.   MRN: [id number]  HPI Rush Landmark is a 61 year old male single nonsmoker who comes in today for evaluation of multiple problems. He has underlying the metabolic syndrome and diabetes type 1 along with hypertension diabetes coronary artery disease stents a couple years ago obesity and recently a decline in his renal function. His nephrologist in [place] stop his metformin and is here for followup. He also 6 days ago finished a taper of prednisone because he had a flareup of his asthma. Pulmonary-wise he feels fine no wheezing. Blood sugars at home are prednisone and dropped in the 120 range. He's currently taking Lantus 100 units at bedtime and he is on a sliding scale of regular before each meal.    Review of Systems General and metabolic review of systems otherwise negative    Objective:   Physical Exam Well-developed overweight male in no acute distress       Assessment & Plan:  Diabetes type 1 check labs  Renal insufficiency secondary to metformin and diabetes recheck renal function now that he is off the metformin  Aspirin resolve  Hypertension at goal BP 110/80

## 2012-03-01 ENCOUNTER — Telehealth: Payer: Self-pay | Admitting: Family Medicine

## 2012-03-01 NOTE — Telephone Encounter (Signed)
Pt called and said that the neurologist in [place], Dr Marlowe Sax, is needing to get lab result faxed to him. Pls fax to fax # [phone].

## 2012-03-01 NOTE — Telephone Encounter (Signed)
Labs faxed

## 2012-04-24 ENCOUNTER — Other Ambulatory Visit: Payer: Self-pay | Admitting: Family Medicine

## 2012-05-28 ENCOUNTER — Other Ambulatory Visit: Payer: Self-pay | Admitting: Family Medicine

## 2012-05-28 NOTE — Telephone Encounter (Signed)
[facility]ed [email or web address]

## 2012-06-24 ENCOUNTER — Other Ambulatory Visit: Payer: Self-pay | Admitting: *Deleted

## 2012-06-24 MED ORDER — INSULIN ASPART 100 UNIT/ML ~~LOC~~ SOLN: SUBCUTANEOUS | Status: DC

## 2012-06-24 NOTE — Telephone Encounter (Signed)
Patient requests refill of Novolog

## 2012-07-10 ENCOUNTER — Other Ambulatory Visit: Payer: Self-pay | Admitting: Family Medicine

## 2012-08-01 ENCOUNTER — Other Ambulatory Visit: Payer: Self-pay | Admitting: Family Medicine

## 2012-08-22 ENCOUNTER — Other Ambulatory Visit: Payer: Self-pay | Admitting: Family Medicine

## 2012-08-22 MED ORDER — HYDROCODONE-ACETAMINOPHEN 10-325 MG PO TABS: 1.0000 | ORAL_TABLET | Freq: Three times a day (TID) | ORAL | Status: DC

## 2012-08-22 NOTE — Addendum Note (Signed)
Addended by: Westley Hummer B on: 08/22/2012 03:22 PM   Modules accepted: Orders

## 2012-09-18 ENCOUNTER — Other Ambulatory Visit: Payer: Self-pay | Admitting: Family Medicine

## 2012-09-28 ENCOUNTER — Other Ambulatory Visit: Payer: Self-pay | Admitting: Family Medicine

## 2012-10-11 ENCOUNTER — Other Ambulatory Visit: Payer: Self-pay | Admitting: Family Medicine

## 2012-10-19 ENCOUNTER — Other Ambulatory Visit: Payer: Self-pay | Admitting: Family Medicine

## 2012-10-26 ENCOUNTER — Other Ambulatory Visit: Payer: Self-pay | Admitting: Family Medicine

## 2012-11-01 ENCOUNTER — Other Ambulatory Visit: Payer: Self-pay | Admitting: Family Medicine

## 2012-12-26 ENCOUNTER — Other Ambulatory Visit: Payer: Self-pay | Admitting: *Deleted

## 2012-12-26 MED ORDER — GLIPIZIDE 10 MG PO TABS: 10.0000 mg | ORAL_TABLET | Freq: Two times a day (BID) | ORAL | Status: DC

## 2012-12-30 LAB — HM DIABETES EYE EXAM

## 2013-01-03 ENCOUNTER — Other Ambulatory Visit: Payer: Self-pay | Admitting: *Deleted

## 2013-01-03 ENCOUNTER — Other Ambulatory Visit: Payer: Self-pay | Admitting: Family Medicine

## 2013-01-03 DIAGNOSIS — J45909 Unspecified asthma, uncomplicated: Secondary | ICD-10-CM

## 2013-01-03 MED ORDER — PREDNISONE 20 MG PO TABS: ORAL_TABLET | ORAL | Status: DC

## 2013-01-07 ENCOUNTER — Other Ambulatory Visit: Payer: Self-pay | Admitting: *Deleted

## 2013-01-07 MED ORDER — HYDROCODONE-ACETAMINOPHEN 10-325 MG PO TABS: ORAL_TABLET | ORAL | Status: DC

## 2013-01-08 ENCOUNTER — Encounter: Payer: Self-pay | Admitting: Family Medicine

## 2013-01-31 ENCOUNTER — Other Ambulatory Visit: Payer: Self-pay | Admitting: Family Medicine

## 2013-02-14 ENCOUNTER — Other Ambulatory Visit: Payer: Self-pay | Admitting: *Deleted

## 2013-02-14 MED ORDER — INSULIN GLARGINE 100 UNIT/ML SOLOSTAR PEN: 100.0000 mL | PEN_INJECTOR | SUBCUTANEOUS | Status: DC

## 2013-02-17 ENCOUNTER — Other Ambulatory Visit: Payer: Self-pay | Admitting: *Deleted

## 2013-02-17 MED ORDER — INSULIN GLARGINE 100 UNIT/ML SOLOSTAR PEN: 100.0000 mL | PEN_INJECTOR | SUBCUTANEOUS | Status: DC

## 2013-02-18 ENCOUNTER — Other Ambulatory Visit: Payer: Self-pay | Admitting: Family Medicine

## 2013-03-03 ENCOUNTER — Other Ambulatory Visit: Payer: Self-pay | Admitting: *Deleted

## 2013-03-03 MED ORDER — INSULIN GLARGINE 100 UNIT/ML SOLOSTAR PEN: 100.0000 [IU] | PEN_INJECTOR | Freq: Every day | SUBCUTANEOUS | Status: DC

## 2013-03-26 ENCOUNTER — Encounter: Payer: Medicare Other | Admitting: Family Medicine

## 2013-03-28 ENCOUNTER — Other Ambulatory Visit: Payer: Self-pay | Admitting: *Deleted

## 2013-03-28 MED ORDER — GLIPIZIDE 10 MG PO TABS: 10.0000 mg | ORAL_TABLET | Freq: Two times a day (BID) | ORAL | Status: DC

## 2013-04-28 ENCOUNTER — Other Ambulatory Visit: Payer: Self-pay | Admitting: *Deleted

## 2013-04-28 ENCOUNTER — Other Ambulatory Visit: Payer: Self-pay | Admitting: Family Medicine

## 2013-04-28 MED ORDER — SIMVASTATIN 40 MG PO TABS: ORAL_TABLET | ORAL | Status: DC

## 2013-05-01 ENCOUNTER — Other Ambulatory Visit: Payer: Self-pay | Admitting: Family Medicine

## 2013-05-21 ENCOUNTER — Other Ambulatory Visit: Payer: Self-pay | Admitting: Family Medicine

## 2013-05-22 ENCOUNTER — Other Ambulatory Visit: Payer: Self-pay | Admitting: *Deleted

## 2013-05-22 MED ORDER — INSULIN GLARGINE 100 UNIT/ML SOLOSTAR PEN: 100.0000 [IU] | PEN_INJECTOR | Freq: Every day | SUBCUTANEOUS | Status: DC

## 2013-05-23 ENCOUNTER — Telehealth: Payer: Self-pay | Admitting: Family Medicine

## 2013-05-23 NOTE — Telephone Encounter (Signed)
Misha from pharmacy called and stated that they need the quantity specified for pt's LANTUS SOLOSTAR 100 UNIT/ML SOPN RX. Please assist.

## 2013-05-23 NOTE — Telephone Encounter (Signed)
Spoke with pharmacist  

## 2013-06-17 ENCOUNTER — Encounter: Payer: Self-pay | Admitting: Family Medicine

## 2013-06-17 ENCOUNTER — Ambulatory Visit (INDEPENDENT_AMBULATORY_CARE_PROVIDER_SITE_OTHER): Payer: Medicare Other | Admitting: Family Medicine

## 2013-06-17 VITALS — BP 140/80 | Temp 98.6°F | Ht 68.0 in | Wt 335.0 lb

## 2013-06-17 DIAGNOSIS — I10 Essential (primary) hypertension: Secondary | ICD-10-CM

## 2013-06-17 DIAGNOSIS — M159 Polyosteoarthritis, unspecified: Secondary | ICD-10-CM

## 2013-06-17 DIAGNOSIS — M545 Low back pain, unspecified: Secondary | ICD-10-CM

## 2013-06-17 DIAGNOSIS — I259 Chronic ischemic heart disease, unspecified: Secondary | ICD-10-CM

## 2013-06-17 DIAGNOSIS — K8020 Calculus of gallbladder without cholecystitis without obstruction: Secondary | ICD-10-CM

## 2013-06-17 DIAGNOSIS — Z23 Encounter for immunization: Secondary | ICD-10-CM

## 2013-06-17 DIAGNOSIS — E109 Type 1 diabetes mellitus without complications: Secondary | ICD-10-CM

## 2013-06-17 DIAGNOSIS — M109 Gout, unspecified: Secondary | ICD-10-CM

## 2013-06-17 DIAGNOSIS — M549 Dorsalgia, unspecified: Secondary | ICD-10-CM

## 2013-06-17 DIAGNOSIS — I2510 Atherosclerotic heart disease of native coronary artery without angina pectoris: Secondary | ICD-10-CM

## 2013-06-17 DIAGNOSIS — R609 Edema, unspecified: Secondary | ICD-10-CM

## 2013-06-17 DIAGNOSIS — J45909 Unspecified asthma, uncomplicated: Secondary | ICD-10-CM

## 2013-06-17 DIAGNOSIS — Z87442 Personal history of urinary calculi: Secondary | ICD-10-CM

## 2013-06-17 DIAGNOSIS — R319 Hematuria, unspecified: Secondary | ICD-10-CM

## 2013-06-17 DIAGNOSIS — N289 Disorder of kidney and ureter, unspecified: Secondary | ICD-10-CM

## 2013-06-17 LAB — POCT URINALYSIS DIPSTICK
Bilirubin, UA: NEGATIVE
Glucose, UA: NEGATIVE
Ketones, UA: NEGATIVE
Leukocytes, UA: NEGATIVE
Spec Grav, UA: 1.02
Urobilinogen, UA: 1
pH, UA: 6.5

## 2013-06-17 LAB — MICROALBUMIN / CREATININE URINE RATIO
Creatinine,U: 76.5 mg/dL
Microalb, Ur: 118.8 mg/dL — ABNORMAL HIGH (ref 0.0–1.9)

## 2013-06-17 LAB — BASIC METABOLIC PANEL
BUN: 33 mg/dL — ABNORMAL HIGH (ref 6–23)
CO2: 27 mEq/L (ref 19–32)
Creatinine, Ser: 1.8 mg/dL — ABNORMAL HIGH (ref 0.4–1.5)
GFR: 41.3 mL/min — ABNORMAL LOW (ref >60.00)
Glucose, Bld: 141 mg/dL — ABNORMAL HIGH (ref 70–99)
Potassium: 5.5 mEq/L — ABNORMAL HIGH (ref 3.5–5.1)
Sodium: 140 mEq/L (ref 135–145)

## 2013-06-17 LAB — CBC WITH DIFFERENTIAL/PLATELET
Basophils Relative: 0.3 % (ref 0.0–3.0)
Eosinophils Relative: 1.9 % (ref 0.0–5.0)
HCT: 32.4 % — ABNORMAL LOW (ref 39.0–52.0)
Hemoglobin: 11 g/dL — ABNORMAL LOW (ref 13.0–17.0)
Lymphs Abs: 1.8 10*3/uL (ref 0.7–4.0)
Monocytes Relative: 8 % (ref 3.0–12.0)
Neutro Abs: 3.8 10*3/uL (ref 1.4–7.7)
Platelets: 160 10*3/uL (ref 150.0–400.0)
RBC: 3.49 Mil/uL — ABNORMAL LOW (ref 4.22–5.81)
WBC: 6.2 10*3/uL (ref 4.5–10.5)

## 2013-06-17 LAB — LIPID PANEL
HDL: 77.5 mg/dL (ref >39.00)
LDL Cholesterol: 20 mg/dL (ref 0–99)
Total CHOL/HDL Ratio: 2
Triglycerides: 159 mg/dL — ABNORMAL HIGH (ref 0.0–149.0)
VLDL: 31.8 mg/dL (ref 0.0–40.0)

## 2013-06-17 LAB — HEPATIC FUNCTION PANEL: Albumin: 4 g/dL (ref 3.5–5.2)

## 2013-06-17 LAB — TSH: TSH: 7.52 u[IU]/mL — ABNORMAL HIGH (ref 0.35–5.50)

## 2013-06-17 MED ORDER — TORSEMIDE 20 MG PO TABS: ORAL_TABLET | ORAL | Status: DC

## 2013-06-17 MED ORDER — INSULIN ASPART 100 UNIT/ML ~~LOC~~ SOLN: SUBCUTANEOUS | Status: DC

## 2013-06-17 MED ORDER — ALLOPURINOL 300 MG PO TABS: ORAL_TABLET | ORAL | Status: DC

## 2013-06-17 MED ORDER — HYDROCODONE-ACETAMINOPHEN 10-325 MG PO TABS: ORAL_TABLET | ORAL | Status: DC

## 2013-06-17 MED ORDER — LABETALOL HCL 300 MG PO TABS: ORAL_TABLET | ORAL | Status: DC

## 2013-06-17 MED ORDER — SIMVASTATIN 40 MG PO TABS: ORAL_TABLET | ORAL | Status: DC

## 2013-06-17 MED ORDER — INSULIN GLARGINE 100 UNIT/ML SOLOSTAR PEN: 100.0000 [IU] | PEN_INJECTOR | Freq: Every day | SUBCUTANEOUS | Status: DC

## 2013-06-17 MED ORDER — GLIPIZIDE 10 MG PO TABS: 10.0000 mg | ORAL_TABLET | Freq: Two times a day (BID) | ORAL | Status: DC

## 2013-06-17 NOTE — Addendum Note (Signed)
Addended by: Westley Hummer B on: 06/17/2013 04:12 PM   Modules accepted: Orders

## 2013-06-17 NOTE — Patient Instructions (Signed)
Demadex 20 mg.........Marland Kitchen 1 tab in the morning and a second dose at noon  Stay complete bedrest today Wednesday and Thursday  Monitor your daily weights  Call you nephrologist and make an appointment to see them next week for followup

## 2013-06-17 NOTE — Progress Notes (Signed)
  Subjective:    Patient ID: Kent Osborne, male    DOB: [birth year], 62 y.o.   MRN: [id number]  HPI Kent Osborne is a 62 year old male who comes in today for general physical examination because of a history of diabetes type 1, hypertension, hyperlipidemia, obesity, chronic renal insufficiency with chronic kidney disease, coronary artery disease status post stents allergic rhinitis asthma and chronic back pain  His medicines reviewed in detail and there've been no basic changes. His weight is up to 335 pounds. States his fasting blood sugar yesterday was 97. He travels here from [place] followed in [place] by his cardiologist and his nephrologist. He's currently having a lot of trouble with peripheral edema. He's taking Demadex 20 mg once daily.  He has not taken any pain medication since October 15. He could not get his medication refilled until now   Review of Systems  Constitutional: Negative.   HENT: Negative.   Eyes: Negative.   Respiratory: Negative.   Cardiovascular: Negative.   Gastrointestinal: Negative.   Genitourinary: Negative.   Musculoskeletal: Negative.   Skin: Negative.   Neurological: Negative.   Psychiatric/Behavioral: Negative.        Objective:   Physical Exam  Constitutional: He is oriented to person, place, and time. He appears well-developed and well-nourished.  HENT:  Head: Normocephalic and atraumatic.  Right Ear: External ear normal.  Left Ear: External ear normal.  Nose: Nose normal.  Mouth/Throat: Oropharynx is clear and moist.  Eyes: Conjunctivae and EOM are normal. Pupils are equal, round, and reactive to light.  Neck: Normal range of motion. Neck supple. No JVD present. No tracheal deviation present. No thyromegaly present.  Cardiovascular: Normal rate, regular rhythm, normal heart sounds and intact distal pulses.  Exam reveals no gallop and no friction rub.   No murmur heard. 4+ peripheral edema  Pulmonary/Chest: Effort normal  and breath sounds normal. No stridor. No respiratory distress. He has no wheezes. He has no rales. He exhibits no tenderness.  Abdominal: Soft. Bowel sounds are normal. He exhibits no distension and no mass. There is no tenderness. There is no rebound and no guarding.  Musculoskeletal: Normal range of motion. He exhibits no edema and no tenderness.  Lymphadenopathy:    He has no cervical adenopathy.  Neurological: He is alert and oriented to person, place, and time. He has normal reflexes. No cranial nerve deficit. He exhibits normal muscle tone.  Skin: Skin is warm and dry. No rash noted. No erythema. No pallor.  Psychiatric: He has a normal mood and affect. His behavior is normal. Judgment and thought content normal.          Assessment & Plan:  Obesity with massive peripheral edema plan doubled the Demadex bed rest for 72 hours followup by nephrology in [place]  History of gout continue allopurinol but cut dose to 150 daily  Diabetes type 1 continue Lantus  Hypertension continue current medications  Status post stent continue blood thinner  Allergic rhinitis and asthma continue current meds  Hyperlipidemia continue simvastatin  Chronic pain continue hydrocodone 10 mg 3 times daily

## 2013-08-25 ENCOUNTER — Telehealth: Payer: Self-pay | Admitting: Family Medicine

## 2013-08-25 DIAGNOSIS — M545 Low back pain, unspecified: Secondary | ICD-10-CM

## 2013-08-25 MED ORDER — HYDROCODONE-ACETAMINOPHEN 10-325 MG PO TABS: ORAL_TABLET | ORAL | Status: DC

## 2013-08-25 NOTE — Telephone Encounter (Signed)
Rx ready for pick up.  Patient is aware and does not need a drug screening.

## 2013-08-25 NOTE — Telephone Encounter (Signed)
Pt following up with Apolonio Schneiders regarding lab work he needs to have done prior to getting his HYDROcodone-acetaminophen ([place]) 10-325 MG per tablet refilled.  Pt wants to confirm he can come in this afternoon to get lab work and his prescription as he lives in [place].

## 2013-09-12 ENCOUNTER — Telehealth: Payer: Self-pay | Admitting: Family Medicine

## 2013-09-12 NOTE — Telephone Encounter (Signed)
Juliann Pulse, please see if you can make an appointment next week with Dr.Todd can use same day if need to if not offer appointment with someone else.

## 2013-09-12 NOTE — Telephone Encounter (Signed)
Pt states his back has been hurting worse lately. Pt would like appt to see dr todd asap.  Pt states he may need to see another provider or go to ED if its going to be longer than a week. Made appt 2/3 for dr todd. Pt aware dr todd out this week

## 2013-09-15 NOTE — Telephone Encounter (Signed)
done

## 2013-09-16 ENCOUNTER — Ambulatory Visit (INDEPENDENT_AMBULATORY_CARE_PROVIDER_SITE_OTHER): Payer: Medicare Other | Admitting: Family Medicine

## 2013-09-16 ENCOUNTER — Encounter: Payer: Self-pay | Admitting: Family Medicine

## 2013-09-16 VITALS — BP 140/70 | Temp 97.3°F | Wt 319.0 lb

## 2013-09-16 DIAGNOSIS — M545 Low back pain, unspecified: Secondary | ICD-10-CM

## 2013-09-16 DIAGNOSIS — N289 Disorder of kidney and ureter, unspecified: Secondary | ICD-10-CM

## 2013-09-16 DIAGNOSIS — E109 Type 1 diabetes mellitus without complications: Secondary | ICD-10-CM

## 2013-09-16 DIAGNOSIS — I2510 Atherosclerotic heart disease of native coronary artery without angina pectoris: Secondary | ICD-10-CM

## 2013-09-16 DIAGNOSIS — M549 Dorsalgia, unspecified: Secondary | ICD-10-CM

## 2013-09-16 DIAGNOSIS — I10 Essential (primary) hypertension: Secondary | ICD-10-CM

## 2013-09-16 DIAGNOSIS — R319 Hematuria, unspecified: Secondary | ICD-10-CM

## 2013-09-16 LAB — MICROALBUMIN / CREATININE URINE RATIO
Creatinine,U: 88.5 mg/dL
Microalb Creat Ratio: 69.7 mg/g — ABNORMAL HIGH (ref 0.0–30.0)
Microalb, Ur: 61.7 mg/dL — ABNORMAL HIGH (ref 0.0–1.9)

## 2013-09-16 LAB — BASIC METABOLIC PANEL
BUN: 38 mg/dL — ABNORMAL HIGH (ref 6–23)
CALCIUM: 9 mg/dL (ref 8.4–10.5)
CO2: 25 mEq/L (ref 19–32)
Chloride: 108 mEq/L (ref 96–112)
Creatinine, Ser: 2.2 mg/dL — ABNORMAL HIGH (ref 0.4–1.5)
GFR: 32.35 mL/min — ABNORMAL LOW (ref >60.00)
GLUCOSE: 128 mg/dL — AB (ref 70–99)
Potassium: 5.8 mEq/L — ABNORMAL HIGH (ref 3.5–5.1)
Sodium: 139 mEq/L (ref 135–145)

## 2013-09-16 LAB — HEMOGLOBIN A1C: HEMOGLOBIN A1C: 7.9 % — AB (ref 4.6–6.5)

## 2013-09-16 LAB — POCT URINALYSIS DIPSTICK
Bilirubin, UA: NEGATIVE
Glucose, UA: NEGATIVE
Ketones, UA: NEGATIVE
LEUKOCYTES UA: NEGATIVE
Nitrite, UA: NEGATIVE
Protein, UA: 100
Spec Grav, UA: 1.015
UROBILINOGEN UA: 0.2
pH, UA: 6

## 2013-09-16 MED ORDER — LABETALOL HCL 300 MG PO TABS: ORAL_TABLET | ORAL | Status: DC

## 2013-09-16 MED ORDER — LORAZEPAM 1 MG PO TABS: ORAL_TABLET | ORAL | Status: DC

## 2013-09-16 NOTE — Progress Notes (Signed)
Pre visit review using our clinic review tool, if applicable. No additional management support is needed unless otherwise documented below in the visit note. 

## 2013-09-16 NOTE — Patient Instructions (Signed)
For the kidney stone I recommend he drink lots of water and strain all urine. The stone typically will pass in a week or 2. If he does not pass see your urologist in [place] blood pressure medication  Ativan 1 mg,,,,,,,, one half to one tablet at bedtime when necessary  Don't where you watch on your left wrist and get a short arm splint to wear at bedtime take off in the morning if the tingling persists

## 2013-09-16 NOTE — Progress Notes (Signed)
   Subjective:    Patient ID: Kent Osborne, male    DOB: December 05, 1950, 63 y.o.   MRN: [id number]  HPI Kent Osborne is a 63 year old male divorced nonsmoker who comes down here from [place] for evaluation of multiple issues  About a week ago he developed some right flank pain. He has chronic back pain and takes Vicodin one tablet 3 times daily however he says it was a different type of pain than is normal back pain. Yesterday the pain went away. His last kidney stone was year ago  He's been having some non-exercise-induced anterior chest pain and went to see his cardiologist in December and [place]. They did a exercise stress test which was normal. They felt that it was anxiety.  He's also had elevated blood pressure and had his labetalol adjusted by his nephrologist and [place]. He's currently taking a 300 mg tablets dose,,,,,, 2 in the morning one at lunch 2 at bedtime. BP 140/70  His diabetes he feels is under better control and his current regime of insulin. He says his blood sugars averaging 140 he takes a 2-3 times per day. A1c in October was 8.3%.  He's also had some tingling in his left hand he is right-handed and he wears his watch on that left hand.  He's also to sleep dysfunction secondary to anxiety   Review of Systems Review of systems otherwise negative    Objective:   Physical Exam  Well-developed well-nourished male no acute distress vital signs stable he is afebrile BP 140/70 weight 318 pounds  Urinalysis shows 2+ blood otherwise normal consistent with a kidney stone  Examination the upper extremities is normal except that his watch Kent Osborne mark on his left wrist. He probably is from carpal tunnel syndrome from the tight watch.      Assessment & Plan:  Right flank pain and hematuria consistent with a kidney stone 8 no pain now,,,,,,, refer back to urologist and [place] drink lots of water strain urine  Diabetes type 1 check labs  Hypertension at goal  continue current regime  Anxiety Ativan each bedtime when necessary for sleep  Carpal tunnel syndrome left wrist advised to DC the watch and wear splint

## 2013-09-19 ENCOUNTER — Telehealth: Payer: Self-pay

## 2013-09-19 NOTE — Telephone Encounter (Signed)
Relevant patient education mailed to patient.  

## 2013-09-23 ENCOUNTER — Ambulatory Visit: Payer: Medicare Other | Admitting: Family Medicine

## 2013-09-25 ENCOUNTER — Telehealth: Payer: Self-pay | Admitting: Family Medicine

## 2013-09-25 NOTE — Telephone Encounter (Signed)
Relevant patient education mailed to patient.  

## 2013-10-27 ENCOUNTER — Other Ambulatory Visit: Payer: Self-pay | Admitting: *Deleted

## 2013-10-27 DIAGNOSIS — M545 Low back pain, unspecified: Secondary | ICD-10-CM

## 2013-10-27 MED ORDER — HYDROCODONE-ACETAMINOPHEN 10-325 MG PO TABS: ORAL_TABLET | ORAL | Status: DC

## 2013-11-17 ENCOUNTER — Telehealth: Payer: Self-pay | Admitting: Family Medicine

## 2013-11-17 DIAGNOSIS — I10 Essential (primary) hypertension: Secondary | ICD-10-CM

## 2013-11-17 MED ORDER — LABETALOL HCL 300 MG PO TABS: ORAL_TABLET | ORAL | Status: DC

## 2013-11-17 NOTE — Telephone Encounter (Signed)
Pt is needing a new rx for labetalol (NORMODYNE) 300 MG tablet, pt states he is out of the meds. States dr. Sherren Mocha forgot to change his dosage so he ran out. Per dr. Sherren Mocha he changed it to 6 a day verses 4 a day. [facility] to [facility] [place]. [phone]

## 2013-12-22 LAB — HM DIABETES EYE EXAM

## 2013-12-25 ENCOUNTER — Telehealth: Payer: Self-pay | Admitting: Family Medicine

## 2013-12-25 DIAGNOSIS — M545 Low back pain, unspecified: Secondary | ICD-10-CM

## 2013-12-25 MED ORDER — HYDROCODONE-ACETAMINOPHEN 10-325 MG PO TABS: ORAL_TABLET | ORAL | Status: DC

## 2013-12-25 NOTE — Telephone Encounter (Signed)
Pt requesting refill of HYDROcodone-acetaminophen (NORCO) 10-325 MG per tablet, states he will be in [place] tomorrow and would like to pick it up then.

## 2013-12-25 NOTE — Telephone Encounter (Signed)
Dr Sherren Mocha is out of the office. Dr Raliegh Ip agreed to supply #[phone].  Patient is aware and will call back for further refills of #200.

## 2014-01-19 ENCOUNTER — Other Ambulatory Visit: Payer: Self-pay | Admitting: Family Medicine

## 2014-01-20 ENCOUNTER — Other Ambulatory Visit: Payer: Self-pay | Admitting: *Deleted

## 2014-01-20 DIAGNOSIS — I10 Essential (primary) hypertension: Secondary | ICD-10-CM

## 2014-01-20 MED ORDER — LABETALOL HCL 300 MG PO TABS: ORAL_TABLET | ORAL | Status: DC

## 2014-01-22 ENCOUNTER — Other Ambulatory Visit: Payer: Self-pay | Admitting: *Deleted

## 2014-01-22 ENCOUNTER — Other Ambulatory Visit: Payer: Self-pay | Admitting: Family Medicine

## 2014-01-22 DIAGNOSIS — I10 Essential (primary) hypertension: Secondary | ICD-10-CM

## 2014-01-22 DIAGNOSIS — M545 Low back pain, unspecified: Secondary | ICD-10-CM

## 2014-01-22 MED ORDER — LABETALOL HCL 300 MG PO TABS: ORAL_TABLET | ORAL | Status: DC

## 2014-01-22 MED ORDER — HYDROCODONE-ACETAMINOPHEN 10-325 MG PO TABS: ORAL_TABLET | ORAL | Status: DC

## 2014-01-22 NOTE — Telephone Encounter (Signed)
Rx ready for pick up and patient is aware 

## 2014-02-16 ENCOUNTER — Other Ambulatory Visit: Payer: Self-pay | Admitting: Family Medicine

## 2014-03-23 ENCOUNTER — Other Ambulatory Visit: Payer: Self-pay | Admitting: *Deleted

## 2014-03-23 DIAGNOSIS — M545 Low back pain: Secondary | ICD-10-CM

## 2014-03-23 NOTE — Telephone Encounter (Signed)
Patient is requesting a refill of hydrocodone

## 2014-03-26 MED ORDER — HYDROCODONE-ACETAMINOPHEN 10-325 MG PO TABS
ORAL_TABLET | ORAL | Status: DC
Start: 2014-03-26 — End: 2014-05-27

## 2014-04-10 ENCOUNTER — Other Ambulatory Visit: Payer: Self-pay | Admitting: Family Medicine

## 2014-04-13 ENCOUNTER — Ambulatory Visit: Payer: Medicare Other | Admitting: Family Medicine

## 2014-04-15 ENCOUNTER — Ambulatory Visit (INDEPENDENT_AMBULATORY_CARE_PROVIDER_SITE_OTHER): Payer: Medicare Other | Admitting: Family Medicine

## 2014-04-15 ENCOUNTER — Encounter: Payer: Self-pay | Admitting: Family Medicine

## 2014-04-15 VITALS — BP 170/80 | Temp 98.1°F | Wt 313.3 lb

## 2014-04-15 DIAGNOSIS — I2510 Atherosclerotic heart disease of native coronary artery without angina pectoris: Secondary | ICD-10-CM

## 2014-04-15 DIAGNOSIS — N289 Disorder of kidney and ureter, unspecified: Secondary | ICD-10-CM

## 2014-04-15 DIAGNOSIS — E109 Type 1 diabetes mellitus without complications: Secondary | ICD-10-CM

## 2014-04-15 DIAGNOSIS — I10 Essential (primary) hypertension: Secondary | ICD-10-CM

## 2014-04-15 DIAGNOSIS — Z23 Encounter for immunization: Secondary | ICD-10-CM

## 2014-04-15 DIAGNOSIS — R609 Edema, unspecified: Secondary | ICD-10-CM

## 2014-04-15 LAB — POCT URINALYSIS DIPSTICK
Bilirubin, UA: NEGATIVE
Ketones, UA: NEGATIVE
LEUKOCYTES UA: NEGATIVE
NITRITE UA: NEGATIVE
Spec Grav, UA: 1.01
UROBILINOGEN UA: 0.2
pH, UA: 5

## 2014-04-15 LAB — CBC WITH DIFFERENTIAL/PLATELET
Basophils Absolute: 0 10*3/uL (ref 0.0–0.1)
Basophils Relative: 0.4 % (ref 0.0–3.0)
EOS PCT: 1.9 % (ref 0.0–5.0)
Eosinophils Absolute: 0.1 10*3/uL (ref 0.0–0.7)
HCT: 28.5 % — ABNORMAL LOW (ref 39.0–52.0)
Hemoglobin: 9.8 g/dL — ABNORMAL LOW (ref 13.0–17.0)
Lymphocytes Relative: 31.2 % (ref 12.0–46.0)
Lymphs Abs: 1.7 10*3/uL (ref 0.7–4.0)
MCHC: 34.4 g/dL (ref 30.0–36.0)
MCV: 91.8 fl (ref 78.0–100.0)
MONO ABS: 0.5 10*3/uL (ref 0.1–1.0)
Monocytes Relative: 10.1 % (ref 3.0–12.0)
NEUTROS PCT: 56.4 % (ref 43.0–77.0)
Neutro Abs: 3 10*3/uL (ref 1.4–7.7)
PLATELETS: 160 10*3/uL (ref 150.0–400.0)
RBC: 3.11 Mil/uL — ABNORMAL LOW (ref 4.22–5.81)
RDW: 13.4 % (ref 11.5–15.5)
WBC: 5.4 10*3/uL (ref 4.0–10.5)

## 2014-04-15 LAB — HEMOGLOBIN A1C: Hgb A1c MFr Bld: 8 % — ABNORMAL HIGH (ref 4.6–6.5)

## 2014-04-15 LAB — MICROALBUMIN / CREATININE URINE RATIO
Creatinine,U: 71 mg/dL
Microalb Creat Ratio: 79.9 mg/g — ABNORMAL HIGH (ref 0.0–30.0)
Microalb, Ur: 56.7 mg/dL — ABNORMAL HIGH (ref 0.0–1.9)

## 2014-04-15 LAB — BASIC METABOLIC PANEL
BUN: 50 mg/dL — AB (ref 6–23)
CALCIUM: 8.9 mg/dL (ref 8.4–10.5)
CO2: 24 meq/L (ref 19–32)
CREATININE: 2.5 mg/dL — AB (ref 0.4–1.5)
Chloride: 106 mEq/L (ref 96–112)
GFR: 27.88 mL/min — ABNORMAL LOW (ref >60.00)
GLUCOSE: 224 mg/dL — AB (ref 70–99)
Potassium: 5.5 mEq/L — ABNORMAL HIGH (ref 3.5–5.1)
Sodium: 136 mEq/L (ref 135–145)

## 2014-04-15 MED ORDER — INSULIN ASPART 100 UNIT/ML FLEXPEN: 90.0000 [IU] | PEN_INJECTOR | Freq: Every day | SUBCUTANEOUS | Status: DC

## 2014-04-15 MED ORDER — INSULIN ASPART 100 UNIT/ML ~~LOC~~ SOLN: 90.0000 [IU] | Freq: Three times a day (TID) | SUBCUTANEOUS | Status: DC

## 2014-04-15 NOTE — Patient Instructions (Signed)
Hydralazine 25 mg.......Marland Kitchen 1 daily in the morning  Blood pressure checked daily in the morning  Blood pressure goal 135/85.......... if in 2 weeks your blood pressure is not at goal then increase the hydralazine ...........Marland Kitchen 1 in the morning and a second dose at noon  Consult with Jettie Booze who is the diabetic nurse educator with Dr. Mady Gemma ASAP

## 2014-04-15 NOTE — Progress Notes (Signed)
   Subjective:    Patient ID: Kent Osborne, male    DOB: Mar 29, 1951, 63 y.o.   MRN: [id number]  HPI Kent Osborne is a 63 year old divorced male nonsmoker who lives in [place] who comes in today for evaluation of multiple issues  He recently saw his cardiologist and then fell. At that time his blood pressure was elevated in a start him on hydralazine 25 mg 3 times daily. He took it for 10 days and his blood pressure dropped to 118/50 and he was lightheaded therefore he stopped the hydralazine completely now BP 170/80  He has type 1 diabetes takes 110 units of Lantus daily along with 30 units of regular before each meal last A1c in January was 7.9%, GFR 32, creatinine 2.2, microalbumin 61.7.  Weight today 331 pounds.     Review of Systems    review of systems otherwise negative Objective:   Physical Exam  Well-developed well-nourished male no acute distress weight up to 331 pounds. He states he's following a diet???? However he can't walk because of the chronic venous insufficiency leg pain and obesity  BP right arm sitting position 170/80    Assessment & Plan:  Hypertension....... not at goal.......... hydralazine 25 mg daily increased to 25 mg twice a day in 2 weeks if blood pressure not at goal  Diabetes type 1........ not at goal........ check labs...........Marland Kitchen endocrinology consult

## 2014-04-15 NOTE — Addendum Note (Signed)
Addended by: Westley Hummer B on: 04/15/2014 01:42 PM   Modules accepted: Orders

## 2014-04-21 ENCOUNTER — Other Ambulatory Visit: Payer: Self-pay | Admitting: Family Medicine

## 2014-05-05 ENCOUNTER — Encounter: Payer: Self-pay | Admitting: Internal Medicine

## 2014-05-05 ENCOUNTER — Ambulatory Visit (INDEPENDENT_AMBULATORY_CARE_PROVIDER_SITE_OTHER): Payer: Medicare Other | Admitting: Internal Medicine

## 2014-05-05 VITALS — BP 142/70 | HR 70 | Temp 97.6°F | Resp 16 | Ht 67.0 in | Wt 330.8 lb

## 2014-05-05 DIAGNOSIS — E109 Type 1 diabetes mellitus without complications: Secondary | ICD-10-CM

## 2014-05-05 DIAGNOSIS — I2510 Atherosclerotic heart disease of native coronary artery without angina pectoris: Secondary | ICD-10-CM

## 2014-05-05 NOTE — Progress Notes (Signed)
Patient ID: Kent Osborne, male   DOB: Aug 05, 1951, 63 y.o.   MRN: [id number]  HPI: Kent Osborne is a 63 y.o.-year-old male, referred by his PCP, Dr. Sherren Mocha, for management of DM2, non-insulin-dependent, uncontrolled, with complications (CAD, DR, PN).  Patient has been diagnosed with diabetes in the 80s; he started insulin in ~2009  Last hemoglobin A1c was: Lab Results  Component Value Date   HGBA1C 8.0* 04/15/2014   HGBA1C 7.9* 09/16/2013   HGBA1C 8.3* 06/17/2013   Pt is on a regimen of: - Glipizide 10 mg bid - Lantus pen 110 units qhs - Novolog 28 units for b'fast, usually skips lunch, but up to 38 units if he eats chinese food; 28 units with dinner.  He is not Metformin anymore b/c CKD stage 3.   Pt checks his sugars 3x a day and they are: - am: 64, 84, 120-180 (lower sugars lately as he started to change his diet). - 2h after b'fast: n/c - before lunch: n/c - 2h after lunch: n/c - before dinner: <200 - 2h after dinner: n/c - bedtime: <180 - nighttime: n/c Some lows. Lowest sugar was 60s; he has hypoglycemia awareness at 100.  Highest sugar was 300s.  Pt's meals are: - Breakfast (most consistent meal): sausage and eggs, bread, coffee - Lunch: skips or burger from [employer]; chiilibean; [place] - Dinner: subway; veggie plate - buys it - Snacks: small cakes (moon pie, etc.) - 9-10 pm (no insulin).  - + CKD stage 3, last BUN/creatinine:  Lab Results  Component Value Date   BUN 50* 04/15/2014   CREATININE 2.5* 04/15/2014  On Benicar. - last set of lipids: Lab Results  Component Value Date   CHOL 129 06/17/2013   HDL 77.50 06/17/2013   LDLCALC 20 06/17/2013   LDLDIRECT 80.7 05/05/2010   TRIG 159.0* 06/17/2013   CHOLHDL 2 06/17/2013  On Zocor. - last eye exam was in 11/2013. 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 He had cataract surgery.  - + numbness and tingling in his feet. He has peripheral neuropathy.  Pt has FH of DM in sister, father.  ROS: Constitutional: + weight gain, no  fatigue, no subjective hyperthermia/hypothermia Eyes: + blurry vision, no xerophthalmia ENT: no sore throat, no nodules palpated in throat, no dysphagia/odynophagia, no hoarseness, + tinnitus Cardiovascular: no CP/+ SOB/no palpitations/+ leg swelling Respiratory: no cough/SOB Gastrointestinal: no N/V/D/+ C Musculoskeletal: + both: muscle/joint aches (back pain) Skin: no rashes Neurological: no tremors/numbness/tingling/dizziness, + HA Psychiatric: no depression/anxiety  Past Medical History  Diagnosis Date  . Diabetes mellitus   . Hypertension   . Obese   . Nephrolithiasis   . CAD (coronary artery disease) 12/31/07    [facility] stents- placed in the circumflex and LAD  . Hyperlipidemia   . OA (osteoarthritis)   . Asthma    Past Surgical History  Procedure Laterality Date  . Heart stents    . Kidney stone surgery     History   Social History  . Marital Status: separated    Spouse Name: N/A    Number of Children: 3   Occupational History  . Retired on disability   Social History Main Topics  . Smoking status: Former Research scientist (life sciences)  . Smokeless tobacco: Not on file  . Alcohol Use: No  . Drug Use: No   Current Outpatient Prescriptions on File Prior to Visit  Medication Sig Dispense Refill  . allopurinol (ZYLOPRIM) 300 MG tablet One half Kent every morning  100 tablet  2  .  aspirin 81 MG tablet Take 81 mg by mouth daily.        Kent Osborne Kitchen azelastine (OPTIVAR) 0.05 % ophthalmic solution       . colchicine 0.6 MG tablet Take 0.6 mg by mouth daily.        . cyclobenzaprine (FLEXERIL) 10 MG tablet Take 10 mg by mouth 3 (three) times daily as needed.        Kent Osborne Kitchen glipiZIDE (GLUCOTROL) 10 MG tablet Take 1 tablet (10 mg total) by mouth 2 (two) times daily before a meal.  200 tablet  3  . HYDROcodone-acetaminophen (NORCO) 10-325 MG per tablet TAKE ONE TABLET BY MOUTH THREE TIMES DAILY  200 tablet  0  . insulin aspart (NOVOLOG FLEXPEN) 100 UNIT/ML FlexPen Inject 90 Units into the skin daily.   90 mL  5  . insulin aspart (NOVOLOG) 100 UNIT/ML injection Inject 90 Units into the skin 3 (three) times daily before meals.  2 vial  0  . Insulin Glargine (LANTUS) 100 UNIT/ML Solostar Pen Inject 110 Units into the skin daily.      Kent Osborne Kitchen labetalol (NORMODYNE) 300 MG tablet 2 in the morning, 2 at noon, 2 at bedtime  600 tablet  3  . LORazepam (ATIVAN) 1 MG tablet TAKE ONE TABLET BY MOUTH AT BEDTIME  90 tablet  1  . montelukast (SINGULAIR) 10 MG tablet       . ONE TOUCH ULTRA TEST test strip USE ONE STRIP TO CHECK GLUCOSE 4 TIMES DAILY  100 each  3  . prasugrel (EFFIENT) 10 MG TABS Take 10 mg by mouth daily.        . simvastatin (ZOCOR) 40 MG tablet TAKE ONE TABLET BY MOUTH EVERY DAY AT BEDTIME  100 tablet  3  . torsemide (DEMADEX) 20 MG tablet 1 by mouth twice a day  200 tablet  3  . triamcinolone (NASACORT) 55 MCG/ACT AERO nasal inhaler        No current facility-administered medications on file prior to visit.   No Known Allergies Family History  Problem Relation Age of Onset  . Diabetes Other   . Hyperlipidemia Other   . Hypertension Other    PE: BP 142/70  Pulse 70  Temp(Src) 97.6 F (36.4 C) (Oral)  Resp 16  Ht 5\' 7"  (1.702 m)  Wt 330 lb 12.8 oz (150.05 kg)  BMI 51.80 kg/m2  SpO2 94% Wt Readings from Last 3 Encounters:  05/05/14 330 lb 12.8 oz (150.05 kg)  04/15/14 313 lb 4.8 oz (142.112 kg)  09/16/13 319 lb (144.697 kg)   Constitutional: obese, in NAD, large neck Eyes: PERRLA, EOMI, no exophthalmos ENT: moist mucous membranes, no thyromegaly, no cervical lymphadenopathy Cardiovascular: RRR, No MRG, + B leg swelling, periankle Respiratory: CTA B Gastrointestinal: abdomen soft, NT, ND, BS+ Musculoskeletal: no deformities, strength intact in all 4 Skin: moist, warm, + stasis dermatitis B legs Neurological: mild tremor with outstretched hands, DTR normal in all 4  ASSESSMENT: 1. DM2, insulin-dependent, uncontrolled, with complications - CAD - DR OU - peripheral  neuropathy  PLAN:  1. Patient with long-standing, uncontrolled diabetes, on basal-bolus insulin + glipizide, with persistent higher sugars.  - We discussed about options for treatment, and I suggested to keep the Glipizide to cover his snacks (we may switch to the XL form in the future), and we will also split Lantus in 2 to avoid lows in am and limit the hyperglycemia during the day. We may switch to Toujeo in the near future:  Patient Instructions  - Please continue Glipizide 10 mg bid - Split the Lantus doses to 55 units 2x a day - Take the NovoLog as follows: 28 units for a regular meal 38 units for a large meal or a meal with dessert  When injecting insulin:  Inject in the abdomen  Rotate the injection sites around the belly button  Change needle for each injection  Keep needle in for 10 sec after last unit of insulin in  Keep the insulin in use out of the fridge  Inject the NovoLog 15 min before a meal  Please return in 1 month with your sugar log.   - Strongly advised him to start checking sugars at different times of the day - check 2-3 times a day, rotating checks - given sugar log and advised how to fill it and to bring it at next appt  - given foot care handout and explained the principles  - given instructions for hypoglycemia management "15-15 rule"  - advised for yearly eye exams >> he is up to date - Return to clinic in 1 mo with sugar log

## 2014-05-05 NOTE — Patient Instructions (Signed)
- Please continue Glipizide 10 mg bid - Split the Lantus doses to 55 units 2x a day - Take the NovoLog as follows: 28 units for a regular meal 38 units for a large meal or a meal with dessert  When injecting insulin:  Inject in the abdomen  Rotate the injection sites around the belly button  Change needle for each injection  Keep needle in for 10 sec after last unit of insulin in  Keep the insulin in use out of the fridge  Inject the NovoLog 15 min before a meal  Please return in 1 month with your sugar log.   PATIENT INSTRUCTIONS FOR TYPE 2 DIABETES:  **Please join MyChart!** - see attached instructions about how to join if you have not done so already.  DIET AND EXERCISE Diet and exercise is an important part of diabetic treatment.  We recommended aerobic exercise in the form of brisk walking (working between 40-60% of maximal aerobic capacity, similar to brisk walking) for 150 minutes per week (such as 30 minutes five days per week) along with 3 times per week performing 'resistance' training (using various gauge rubber tubes with handles) 5-10 exercises involving the major muscle groups (upper body, lower body and core) performing 10-15 repetitions (or near fatigue) each exercise. Start at half the above goal but build slowly to reach the above goals. If limited by weight, joint pain, or disability, we recommend daily walking in a swimming pool with water up to waist to reduce pressure from joints while allow for adequate exercise.    BLOOD GLUCOSES Monitoring your blood glucoses is important for continued management of your diabetes. Please check your blood glucoses 2-4 times a day: fasting, before meals and at bedtime (you can rotate these measurements - e.g. one day check before the 3 meals, the next day check before 2 of the meals and before bedtime, etc.).   HYPOGLYCEMIA (low blood sugar) Hypoglycemia is usually a reaction to not eating, exercising, or taking too much  insulin/ other diabetes drugs.  Symptoms include tremors, sweating, hunger, confusion, headache, etc. Treat IMMEDIATELY with 15 grams of Carbs:   4 glucose tablets    cup regular juice/soda   2 tablespoons raisins   4 teaspoons sugar   1 tablespoon honey Recheck blood glucose in 15 mins and repeat above if still symptomatic/blood glucose <100.  RECOMMENDATIONS TO REDUCE YOUR RISK OF DIABETIC COMPLICATIONS: * Take your prescribed MEDICATION(S) * Follow a DIABETIC diet: Complex carbs, fiber rich foods, (monounsaturated and polyunsaturated) fats * AVOID saturated/trans fats, high fat foods, >2,300 mg salt per day. * EXERCISE at least 5 times a week for 30 minutes or preferably daily.  * DO NOT SMOKE OR DRINK more than 1 drink a day. * Check your FEET every day. Do not wear tightfitting shoes. Contact us if you develop an ulcer * See your EYE doctor once a year or more if needed * Get a FLU shot once a year * Get a PNEUMONIA vaccine once before and once after age 16 years  GOALS:  * Your Hemoglobin A1c of <7%  * fasting sugars need to be <130 * after meals sugars need to be <180 (2h after you start eating) * Your Systolic BP should be [id number] or lower  * Your Diastolic BP should be 80 or lower  * Your HDL (Good Cholesterol) should be 40 or higher  * Your LDL (Bad Cholesterol) should be 100 or lower. * Your Triglycerides should be 150 or  lower  * Your Urine microalbumin (kidney function) should be <30 * Your Body Mass Index should be 25 or lower    Please consider the following ways to cut down carbs and fat and increase fiber and micronutrients in your diet: - substitute whole grain for white bread or pasta - substitute brown rice for white rice - substitute 90-calorie flat bread pieces for slices of bread when possible - substitute sweet potatoes or yams for white potatoes - substitute humus for margarine - substitute tofu for cheese when possible - substitute almond or rice milk  for regular milk (would not drink soy milk daily due to concern for soy estrogen influence on breast cancer risk) - substitute dark chocolate for other sweets when possible - substitute water - can add lemon or orange slices for taste - for diet sodas (artificial sweeteners will trick your body that you can eat sweets without getting calories and will lead you to overeating and weight gain in the long run) - do not skip breakfast or other meals (this will slow down the metabolism and will result in more weight gain over time)  - can try smoothies made from fruit and almond/rice milk in am instead of regular breakfast - can also try old-fashioned (not instant) oatmeal made with almond/rice milk in am - order the dressing on the side when eating salad at a restaurant (pour less than half of the dressing on the salad) - eat as little meat as possible - can try juicing, but should not forget that juicing will get rid of the fiber, so would alternate with eating raw veg./fruits or drinking smoothies - use as little oil as possible, even when using olive oil - can dress a salad with a mix of balsamic vinegar and lemon juice, for e.g. - use agave nectar, stevia sugar, or regular sugar rather than artificial sweateners - steam or broil/roast veggies  - snack on veggies/fruit/nuts (unsalted, preferably) when possible, rather than processed foods - reduce or eliminate aspartame in diet (it is in diet sodas, chewing gum, etc) Read the labels!  Try to read Dr. Janene Harvey book: "Program for Reversing Diabetes" for other ideas for healthy eating.

## 2014-05-27 ENCOUNTER — Other Ambulatory Visit: Payer: Self-pay | Admitting: *Deleted

## 2014-05-27 DIAGNOSIS — M545 Low back pain: Secondary | ICD-10-CM

## 2014-05-27 MED ORDER — HYDROCODONE-ACETAMINOPHEN 10-325 MG PO TABS: ORAL_TABLET | ORAL | Status: DC

## 2014-05-27 NOTE — Telephone Encounter (Signed)
Rx ready for pick up and patient is aware 

## 2014-06-18 ENCOUNTER — Ambulatory Visit: Payer: Medicare Other | Admitting: Internal Medicine

## 2014-06-20 ENCOUNTER — Other Ambulatory Visit: Payer: Self-pay | Admitting: Family Medicine

## 2014-06-30 LAB — HM DIABETES EYE EXAM

## 2014-07-03 ENCOUNTER — Other Ambulatory Visit: Payer: Self-pay | Admitting: Family Medicine

## 2014-07-07 ENCOUNTER — Other Ambulatory Visit: Payer: Self-pay | Admitting: *Deleted

## 2014-07-07 MED ORDER — GLUCOSE BLOOD VI STRP: ORAL_STRIP | Status: DC

## 2014-07-07 NOTE — Telephone Encounter (Signed)
Rx sent 

## 2014-07-09 ENCOUNTER — Ambulatory Visit (INDEPENDENT_AMBULATORY_CARE_PROVIDER_SITE_OTHER): Payer: Medicare Other | Admitting: Internal Medicine

## 2014-07-09 ENCOUNTER — Encounter: Payer: Self-pay | Admitting: Internal Medicine

## 2014-07-09 VITALS — BP 140/68 | HR 67 | Temp 97.9°F | Resp 12 | Wt 325.0 lb

## 2014-07-09 DIAGNOSIS — E1165 Type 2 diabetes mellitus with hyperglycemia: Principal | ICD-10-CM

## 2014-07-09 DIAGNOSIS — IMO0002 Reserved for concepts with insufficient information to code with codable children: Secondary | ICD-10-CM | POA: Insufficient documentation

## 2014-07-09 DIAGNOSIS — E1159 Type 2 diabetes mellitus with other circulatory complications: Secondary | ICD-10-CM

## 2014-07-09 DIAGNOSIS — E1151 Type 2 diabetes mellitus with diabetic peripheral angiopathy without gangrene: Secondary | ICD-10-CM | POA: Insufficient documentation

## 2014-07-09 DIAGNOSIS — I2510 Atherosclerotic heart disease of native coronary artery without angina pectoris: Secondary | ICD-10-CM

## 2014-07-09 NOTE — Progress Notes (Signed)
Patient ID: Kent Osborne, male   DOB: 28-Oct-1950, 63 y.o.   MRN: [id number]  HPI: Kent Osborne is a 63 y.o.-year-old male, returning for f/u for DM2, dx 1980s, insulin-dependent since ~2009, uncontrolled, with complications (CAD, DR, PN).  Last hemoglobin A1c was: Lab Results  Component Value Date   HGBA1C 8.0* 04/15/2014   HGBA1C 7.9* 09/16/2013   HGBA1C 8.3* 06/17/2013   Pt is on a regimen of: - Glipizide 10 mg bid - Lantus doses 55 units 2x a day - NovoLog as follows: 28 units for a regular meal >> 12 units for a smaller meal  38 units for a large meal or a meal with dessert >> 16 units for a larger meal    Pt checks his sugars 4x a day and they are much better (forgot log)!: - am: 64, 84, 120-180 (lower sugars lately as he started to change his diet) >> 75-100, 140x1 - 2h after b'fast: n/c - before lunch: n/c >> 80-100 - 2h after lunch: n/c >> 100-150 - before dinner: <200 >> 80-100 - 2h after dinner: n/c >> 100-150 - bedtime: <180 >> >100 - nighttime: n/c Lowest sugar was 67; he has hypoglycemia awareness at 100.  Highest sugar was 300s >> 180 since last visit.  Pt's meals are: - Breakfast (most consistent meal): sausage and eggs, bread, coffee - Lunch: skips or burger from [employer]; chiilib[place]; [place] - Dinner: subway; veggie plate - buys it - Snacks: small cakes (moon pie, etc.) - 9-10 pm (no insulin).  - + CKD stage 3-4, last BUN/creatinine:  Lab Results  Component Value Date   BUN 50* 04/15/2014   CREATININE 2.5* 04/15/2014  On Benicar. - last set of lipids: Lab Results  Component Value Date   CHOL 129 06/17/2013   HDL 77.50 06/17/2013   LDLCALC 20 06/17/2013   LDLDIRECT 80.7 05/05/2010   TRIG 159.0* 06/17/2013   CHOLHDL 2 06/17/2013  On Zocor. - last eye exam was in 06/30/2014. 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 He had cataract surgery.  - + numbness and tingling in his feet. He has peripheral neuropathy.  ROS: Constitutional: + weight loss, no fatigue, no  subjective hyperthermia/hypothermia Eyes: no blurry vision, no xerophthalmia ENT: no sore throat, no nodules palpated in throat, no dysphagia/odynophagia, no hoarseness Cardiovascular: no CP/+ SOB/no palpitations/+ leg swelling Respiratory: no cough/SOB Gastrointestinal: no N/V/D/C Musculoskeletal: no muscle/+ joint aches (back pain) Skin: no rashes Neurological: no tremors/numbness/tingling/dizziness  I reviewed pt's medications, allergies, PMH, social hx, family hx and no changes required, except as mentioned above.  PE: BP 140/68 mmHg  Pulse 67  Temp(Src) 97.9 F (36.6 C) (Oral)  Resp 12  Wt 325 lb (147.419 kg)  SpO2 95% Wt Readings from Last 3 Encounters:  07/09/14 325 lb (147.419 kg)  05/05/14 330 lb 12.8 oz (150.05 kg)  04/15/14 313 lb 4.8 oz (142.112 kg)   Constitutional: obese, in NAD, large neck Eyes: PERRLA, EOMI, no exophthalmos ENT: moist mucous membranes, no thyromegaly, no cervical lymphadenopathy Cardiovascular: RRR, No MRG, + B leg swelling, periankle Respiratory: CTA B Gastrointestinal: abdomen soft, NT, ND, BS+ Musculoskeletal: no deformities, strength intact in all 4 Skin: moist, warm, + stasis dermatitis B legs Neurological: mild tremor with outstretched hands, DTR normal in all 4  ASSESSMENT: 1. 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 Patient with long-standing, uncontrolled diabetes, on basal-bolus insulin + glipizide, with much improved ctrl in last month!  - We discussed  about options for treatment, and I suggested to keep the Glipizide to cover his snacks and will decrease the Lantus while increasing the Novolog. We may switch to Toujeo in the near future:    Patient Instructions  - Please continue Glipizide 10 mg 2x a day - Decrease Lantus doses to 40 units 2x a day (first try 40 units in am and 50 units in pm x 4-5 days and then decrease to 40 units 2x a day) - Increase the  NovoLog as follows: 14 units for a regular meal 18 units for a large meal or a meal with dessert  Please let me know if the sugars are consistently <80 or >200.  Please return in 1-1.5 month with your sugar log.   - continue checking sugars at different times of the day - check 2-3 times a day, rotating checks - advised for yearly eye exams >> he is up to date - got the flu and PNA vaccine this season - Return to clinic in 1-1.5 mo with sugar log >> will check HbA1c and Lipids then

## 2014-07-09 NOTE — Patient Instructions (Signed)
-   Please continue Glipizide 10 mg 2x a day - Decrease Lantus doses to 40 units 2x a day (first try 40 units in am and 50 units in pm x 4-5 days and then decrease to 40 units 2x a day) - Increase the NovoLog as follows: 14 units for a regular meal 18 units for a large meal or a meal with dessert  Please let me know if the sugars are consistently <80 or >200.  Please return in 1-1.5 month with your sugar log.

## 2014-07-13 ENCOUNTER — Other Ambulatory Visit: Payer: Self-pay | Admitting: Family Medicine

## 2014-07-23 ENCOUNTER — Other Ambulatory Visit: Payer: Self-pay | Admitting: *Deleted

## 2014-07-23 DIAGNOSIS — M545 Low back pain: Secondary | ICD-10-CM

## 2014-07-23 MED ORDER — HYDROCODONE-ACETAMINOPHEN 10-325 MG PO TABS: ORAL_TABLET | ORAL | Status: DC

## 2014-07-23 NOTE — Telephone Encounter (Signed)
Rx ready for pick up and patient is aware 

## 2014-08-10 ENCOUNTER — Other Ambulatory Visit: Payer: Self-pay | Admitting: Family Medicine

## 2014-08-13 ENCOUNTER — Other Ambulatory Visit: Payer: Self-pay | Admitting: *Deleted

## 2014-08-13 DIAGNOSIS — M1000 Idiopathic gout, unspecified site: Secondary | ICD-10-CM

## 2014-08-13 MED ORDER — COLCHICINE 0.6 MG PO TABS: 0.6000 mg | ORAL_TABLET | Freq: Every day | ORAL | Status: DC

## 2014-08-13 MED ORDER — ALLOPURINOL 300 MG PO TABS: ORAL_TABLET | ORAL | Status: DC

## 2014-08-13 NOTE — Telephone Encounter (Signed)
Patient calling for a refill. rx sent.

## 2014-08-17 ENCOUNTER — Other Ambulatory Visit: Payer: Self-pay | Admitting: Family Medicine

## 2014-08-20 ENCOUNTER — Other Ambulatory Visit: Payer: Self-pay | Admitting: *Deleted

## 2014-08-20 ENCOUNTER — Encounter: Payer: Self-pay | Admitting: Internal Medicine

## 2014-08-20 ENCOUNTER — Ambulatory Visit (INDEPENDENT_AMBULATORY_CARE_PROVIDER_SITE_OTHER): Payer: Medicare Other | Admitting: Internal Medicine

## 2014-08-20 VITALS — BP 128/76 | HR 69 | Temp 98.3°F | Resp 12 | Wt 330.8 lb

## 2014-08-20 DIAGNOSIS — E1159 Type 2 diabetes mellitus with other circulatory complications: Secondary | ICD-10-CM

## 2014-08-20 DIAGNOSIS — E1151 Type 2 diabetes mellitus with diabetic peripheral angiopathy without gangrene: Secondary | ICD-10-CM

## 2014-08-20 DIAGNOSIS — IMO0002 Reserved for concepts with insufficient information to code with codable children: Secondary | ICD-10-CM

## 2014-08-20 DIAGNOSIS — I2510 Atherosclerotic heart disease of native coronary artery without angina pectoris: Secondary | ICD-10-CM

## 2014-08-20 DIAGNOSIS — E1165 Type 2 diabetes mellitus with hyperglycemia: Secondary | ICD-10-CM

## 2014-08-20 DIAGNOSIS — G629 Polyneuropathy, unspecified: Secondary | ICD-10-CM

## 2014-08-20 DIAGNOSIS — E1142 Type 2 diabetes mellitus with diabetic polyneuropathy: Secondary | ICD-10-CM | POA: Insufficient documentation

## 2014-08-20 DIAGNOSIS — E785 Hyperlipidemia, unspecified: Secondary | ICD-10-CM

## 2014-08-20 LAB — HEMOGLOBIN A1C
HEMOGLOBIN A1C: 6.7 % — AB (ref <5.7)
Mean Plasma Glucose: 146 mg/dL — ABNORMAL HIGH (ref <117)

## 2014-08-20 LAB — LIPID PANEL
CHOL/HDL RATIO: 4.2 ratio
Cholesterol: 97 mg/dL (ref 0–200)
HDL: 23 mg/dL — ABNORMAL LOW (ref >39)
LDL Cholesterol: 23 mg/dL (ref 0–99)
TRIGLYCERIDES: 255 mg/dL — AB (ref <150)
VLDL: 51 mg/dL — AB (ref 0–40)

## 2014-08-20 MED ORDER — NONFORMULARY OR COMPOUNDED ITEM: Status: DC

## 2014-08-20 NOTE — Progress Notes (Signed)
Patient ID: Kent Osborne, male   DOB: 1951/04/29, 63 y.o.   MRN: [id number]  HPI: Kent Osborne is a 63 y.o.-year-old male, returning for f/u for DM2, dx 1980s, insulin-dependent since ~2009, uncontrolled, with complications (CAD, DR, PN). Last visit 1.5 mo ago.  He had a gout attack last week. He started Colchicine.  He c/o more neuropathy pain. Cannot use Neurontin >> leg swelling. Also, he has CKD.  Last hemoglobin A1c was: Lab Results  Component Value Date   HGBA1C 8.0* 04/15/2014   HGBA1C 7.9* 09/16/2013   HGBA1C 8.3* 06/17/2013   Pt is on a regimen of: - Glipizide 10 mg bid - Lantus doses 55 >> 45 units 2x a day - NovoLog as follows:   12 units for a small meal 21 units for a large meal or a meal with dessert  He may take extra insulin if high CBGs after a meal  He skips NovoLog if sugars <100, but also depends on what he eats.  Pt checks his sugars 4x a day and they are (forgot log again!): - am: 64, 84, 120-180 (lower sugars lately as he started to change his diet) >> 75-100, 140x1 >> 95-120 - 2h after b'fast: n/c - before lunch: n/c >> 80-100 >> 140s - 2h after lunch: n/c >> 100-150 >> n/c - before dinner: <200 >> 80-100 >> n/c - 2h after dinner: n/c >> 100-150 >> n/c - bedtime: <180 >> >100 >> 150 - nighttime: n/c Lowest sugar was 67, in am; he has hypoglycemia awareness at 100.  Highest sugar was 300s >> 180 >> 200 since last visit.  Pt's meals are: - Breakfast (most consistent meal): sausage and eggs, bread, coffee - Lunch: skips or burger from [employer]; chiilibean; [place] - Dinner: subway; veggie plate - buys it - Snacks: small cakes (moon pie, etc.) - 9-10 pm (no insulin).  - + CKD stage 3-4, last BUN/creatinine:  Lab Results  Component Value Date   BUN 50* 04/15/2014   CREATININE 2.5* 04/15/2014  On Benicar. - last set of lipids: Lab Results  Component Value Date   CHOL 129 06/17/2013   HDL 77.50 06/17/2013   LDLCALC 20 06/17/2013   LDLDIRECT 80.7 05/05/2010   TRIG 159.0* 06/17/2013   CHOLHDL 2 06/17/2013  On Zocor 40. - last eye exam was in 06/30/2014. 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 He had cataract surgery.  - + numbness, pain, and tingling in his feet. He has peripheral neuropathy.  ROS: Constitutional: no weight loss, no fatigue, no subjective hyperthermia/hypothermia Eyes: no blurry vision, no xerophthalmia ENT: no sore throat, no nodules palpated in throat, no dysphagia/odynophagia, no hoarseness Cardiovascular: no CP/SOB/no palpitations/+ leg swelling (L) Respiratory: no cough/SOB Gastrointestinal: no N/V/D/C Musculoskeletal: no muscle/+ joint aches (recent got - L foot) Skin: no rashes Neurological: no tremors/numbness/tingling/dizziness  I reviewed pt's medications, allergies, PMH, social hx, family hx, and changes were documented in the history of present illness. Otherwise, unchanged from my initial visit note.  PE: BP 128/76 mmHg  Pulse 69  Temp(Src) 98.3 F (36.8 C) (Oral)  Resp 12  Wt 330 lb 12.8 oz (150.05 kg)  SpO2 96% Wt Readings from Last 3 Encounters:  08/20/14 330 lb 12.8 oz (150.05 kg)  07/09/14 325 lb (147.419 kg)  05/05/14 330 lb 12.8 oz (150.05 kg)   Constitutional: obese, in NAD, large neck Eyes: PERRLA, EOMI, no exophthalmos ENT: moist mucous membranes, no thyromegaly, no cervical lymphadenopathy Cardiovascular: RRR, No MRG, + L leg swelling, periankle Respiratory: CTA  B Gastrointestinal: abdomen soft, NT, ND, BS+ Musculoskeletal: no deformities, strength intact in all 4 Skin: moist, warm, + stasis dermatitis B legs Neurological: mild tremor with outstretched hands, DTR normal in all 4  ASSESSMENT: 1. DM2, insulin-dependent, uncontrolled, with complications - CAD - DR OU - peripheral neuropathy  2. Diabetic Peripheral Neuropathy  PLAN:  1. Patient with long-standing, uncontrolled diabetes, on basal-bolus insulin + glipizide, with fairly good control - We discussed about options for  treatment, and I suggested to keep the Glipizide to cover his snacks and will decrease the Lantus further while increasing the Novolog since he has lows in am and possibly at night if sugars at bedtime not >150. Advised to not skip the insulin completely if sugars <100, but take less. We may switch to Toujeo in the near future, now got plenty of Lantus at home.   Patient Instructions  - Please continue Glipizide 10 mg 2x a day - Decrease Lantus doses to 35 units 2x a day - Increase the NovoLog as follows: 15 units for a regular meal 21 units for a large meal or a meal with dessert  Please let me know if the sugars are consistently <80 or >200.  Please return in 3 month with your sugar log.   - continue checking sugars at different times of the day - check 2-3 times a day, rotating checks - he is up to date with yearly eye exams - got the flu and PNA vaccine this season - check HbA1c and lipids (he is not fasting, though) - Return to clinic in 3 mo with sugar log  2. Diabetic Peripheral Neuropathy - will try neuropathic cream: -  Amitriptyline 2%, Lidocaine 2%, and Ketamine 1% - apply on feet 2x a day  Orders Only on 08/20/2014  Component Date Value Ref Range Status  . Hgb A1c MFr Bld 08/20/2014 6.7* <5.7 % Final   Comment:                                                                        According to the ADA Clinical Practice Recommendations for 2011, when HbA1c is used as a screening test:     >=6.5%   Diagnostic of Diabetes Mellitus            (if abnormal result is confirmed)   5.7-6.4%   Increased risk of developing Diabetes Mellitus   References:Diagnosis and Classification of Diabetes Mellitus,Diabetes [id number] 1):S62-S69 and Standards of Medical Care in         Diabetes - 2011,Diabetes [id number] (Suppl 1):S11-S61.     . Mean Plasma Glucose 08/20/2014 146* <117 mg/dL Final  . Cholesterol 08/20/2014 97  0 - 200 mg/dL Final   Comment: ATP III  Classification:       < 200        mg/dL        Desirable      200 - 239     mg/dL        Borderline High      >= 240        mg/dL        High     . Triglycerides 08/20/2014 255* <150 mg/dL Final  . HDL 08/20/2014 23* >  39 mg/dL Final  . Total CHOL/HDL Ratio 08/20/2014 4.2   Final  . VLDL 08/20/2014 51* 0 - 40 mg/dL Final  . LDL Cholesterol 08/20/2014 23  0 - 99 mg/dL Final   Comment:   Total Cholesterol/HDL Ratio:CHD Risk                        Coronary Heart Disease Risk Table                                        Men       Women          1/2 Average Risk              3.4        3.3              Average Risk              5.0        4.4           2X Average Risk              9.6        7.1           3X Average Risk             23.4       11.0 Use the calculated Patient Ratio above and the CHD Risk table  to determine the patient's CHD Risk. ATP III Classification (LDL):       < 100        mg/dL         Optimal      100 - 129     mg/dL         Near or Above Optimal      130 - 159     mg/dL         Borderline High      160 - 189     mg/dL         High       > 190        mg/dL         Very High      HbA1c great! TG high (but not fasting), LDL great!

## 2014-08-20 NOTE — Patient Instructions (Signed)
-   Please continue Glipizide 10 mg 2x a day - Decrease Lantus doses to 35 units 2x a day - Increase the NovoLog as follows: 15 units for a regular meal 21 units for a large meal or a meal with dessert  Please try to find out if Toujeo is covered by your insurance.  Please start the neuropathy cream I recommended - compounded at [facility].  Please let me know if the sugars are consistently <80 or >200.  Please return in 3 month with your sugar log.

## 2014-08-26 ENCOUNTER — Encounter: Payer: Self-pay | Admitting: *Deleted

## 2014-09-09 DIAGNOSIS — N281 Cyst of kidney, acquired: Secondary | ICD-10-CM | POA: Diagnosis not present

## 2014-09-09 DIAGNOSIS — Z794 Long term (current) use of insulin: Secondary | ICD-10-CM | POA: Diagnosis not present

## 2014-09-09 DIAGNOSIS — M545 Low back pain: Secondary | ICD-10-CM | POA: Diagnosis not present

## 2014-09-09 DIAGNOSIS — N184 Chronic kidney disease, stage 4 (severe): Secondary | ICD-10-CM | POA: Diagnosis not present

## 2014-09-09 DIAGNOSIS — I129 Hypertensive chronic kidney disease with stage 1 through stage 4 chronic kidney disease, or unspecified chronic kidney disease: Secondary | ICD-10-CM | POA: Diagnosis not present

## 2014-09-09 DIAGNOSIS — Z9861 Coronary angioplasty status: Secondary | ICD-10-CM | POA: Diagnosis not present

## 2014-09-09 DIAGNOSIS — E119 Type 2 diabetes mellitus without complications: Secondary | ICD-10-CM | POA: Diagnosis not present

## 2014-09-10 ENCOUNTER — Ambulatory Visit: Payer: Medicare Other | Admitting: Family Medicine

## 2014-09-18 ENCOUNTER — Telehealth: Payer: Self-pay | Admitting: Family Medicine

## 2014-09-18 DIAGNOSIS — M545 Low back pain: Secondary | ICD-10-CM

## 2014-09-18 NOTE — Telephone Encounter (Signed)
Pt request refill of the following: HYDROcodone-acetaminophen (NORCO) 10-325 MG per tablet ° ° °Phamacy: ° °

## 2014-09-21 NOTE — Telephone Encounter (Signed)
Not due till 09/23/14

## 2014-09-21 NOTE — Telephone Encounter (Signed)
See result note.  

## 2014-09-22 ENCOUNTER — Encounter: Payer: Self-pay | Admitting: Family Medicine

## 2014-09-22 ENCOUNTER — Ambulatory Visit (INDEPENDENT_AMBULATORY_CARE_PROVIDER_SITE_OTHER): Payer: Medicare Other | Admitting: Family Medicine

## 2014-09-22 VITALS — BP 138/72 | Temp 98.2°F | Wt 329.0 lb

## 2014-09-22 DIAGNOSIS — E1159 Type 2 diabetes mellitus with other circulatory complications: Secondary | ICD-10-CM

## 2014-09-22 DIAGNOSIS — M5441 Lumbago with sciatica, right side: Secondary | ICD-10-CM

## 2014-09-22 DIAGNOSIS — E1151 Type 2 diabetes mellitus with diabetic peripheral angiopathy without gangrene: Secondary | ICD-10-CM

## 2014-09-22 DIAGNOSIS — M545 Low back pain: Secondary | ICD-10-CM

## 2014-09-22 DIAGNOSIS — IMO0002 Reserved for concepts with insufficient information to code with codable children: Secondary | ICD-10-CM

## 2014-09-22 DIAGNOSIS — E1165 Type 2 diabetes mellitus with hyperglycemia: Principal | ICD-10-CM

## 2014-09-22 DIAGNOSIS — I10 Essential (primary) hypertension: Secondary | ICD-10-CM | POA: Diagnosis not present

## 2014-09-22 MED ORDER — HYDROCODONE-ACETAMINOPHEN 10-325 MG PO TABS: ORAL_TABLET | ORAL | Status: DC

## 2014-09-22 NOTE — Patient Instructions (Signed)
Over-the-counter cortisone cream,,,,,,,,,,,, small amounts twice daily to the skin rash  Continue other medications  Your diabetes is under much better control......... keep up the good work.........Marland Kitchen good luck in the future  When you see a cardiologist asked him to get you set up for a primary care doctor in [place],

## 2014-09-22 NOTE — Progress Notes (Signed)
   Subjective:    Patient ID: Kent Osborne, male    DOB: [birth year], 64 y.o.   MRN: [id number]  HPI  Kent Osborne is a 64 year old male who has underlying diabetes, hypertension, hyperlipidemia, history of coronary artery disease, renal insufficiency, who comes in today for evaluation following an emergency room visit in [place]  He lives in [place] and has a nephrologist and a cardiologist there. Last week he went the emergency room with severe back pain. Studies were negative. He went home blood fit his mattress and found that he had been sleeping on 2 pillows to keep the edema of his lower extremities minimal however there was a big pit in his mattress. He turned his mattress over and his back pain has gotten better.  He's followed by endocrinology A1c less than 7  He also has a skin rash it comes and goes. The red bumps that are very pleuritic   Review of Systems    review of systems otherwise negative Objective:   Physical Exam  Well-developed well-nourished male no acute distress vital signs stable he is afebrile  Examination skin shows small areas of erythematous papules consistent with a contact dermatitis      Assessment & Plan:  Mechanical low back pain.......... treat symptomatically  Contact dermatitis........ OTC steroid cream  Diabetes hypertension coronary artery disease renal insufficiency all stable

## 2014-09-22 NOTE — Progress Notes (Signed)
Pre visit review using our clinic review tool, if applicable. No additional management support is needed unless otherwise documented below in the visit note. 

## 2014-09-23 ENCOUNTER — Telehealth: Payer: Self-pay | Admitting: Family Medicine

## 2014-09-23 NOTE — Telephone Encounter (Signed)
emmi emailed °

## 2014-09-28 ENCOUNTER — Ambulatory Visit: Payer: Medicare Other | Admitting: Family Medicine

## 2014-10-01 DIAGNOSIS — I2510 Atherosclerotic heart disease of native coronary artery without angina pectoris: Secondary | ICD-10-CM | POA: Diagnosis not present

## 2014-10-01 DIAGNOSIS — I10 Essential (primary) hypertension: Secondary | ICD-10-CM | POA: Diagnosis not present

## 2014-10-01 DIAGNOSIS — R079 Chest pain, unspecified: Secondary | ICD-10-CM | POA: Diagnosis not present

## 2014-10-01 DIAGNOSIS — E785 Hyperlipidemia, unspecified: Secondary | ICD-10-CM | POA: Diagnosis not present

## 2014-10-02 DIAGNOSIS — I2510 Atherosclerotic heart disease of native coronary artery without angina pectoris: Secondary | ICD-10-CM | POA: Diagnosis not present

## 2014-10-02 DIAGNOSIS — I10 Essential (primary) hypertension: Secondary | ICD-10-CM | POA: Diagnosis not present

## 2014-10-02 DIAGNOSIS — E039 Hypothyroidism, unspecified: Secondary | ICD-10-CM | POA: Diagnosis not present

## 2014-10-02 DIAGNOSIS — E785 Hyperlipidemia, unspecified: Secondary | ICD-10-CM | POA: Diagnosis not present

## 2014-10-02 DIAGNOSIS — I509 Heart failure, unspecified: Secondary | ICD-10-CM | POA: Diagnosis not present

## 2014-10-02 DIAGNOSIS — R0600 Dyspnea, unspecified: Secondary | ICD-10-CM | POA: Diagnosis not present

## 2014-10-06 DIAGNOSIS — I493 Ventricular premature depolarization: Secondary | ICD-10-CM | POA: Diagnosis not present

## 2014-10-06 DIAGNOSIS — I491 Atrial premature depolarization: Secondary | ICD-10-CM | POA: Diagnosis not present

## 2014-10-13 DIAGNOSIS — I493 Ventricular premature depolarization: Secondary | ICD-10-CM | POA: Diagnosis not present

## 2014-10-13 DIAGNOSIS — R002 Palpitations: Secondary | ICD-10-CM | POA: Diagnosis not present

## 2014-10-20 DIAGNOSIS — R609 Edema, unspecified: Secondary | ICD-10-CM | POA: Diagnosis not present

## 2014-10-20 DIAGNOSIS — I1310 Hypertensive heart and chronic kidney disease without heart failure, with stage 1 through stage 4 chronic kidney disease, or unspecified chronic kidney disease: Secondary | ICD-10-CM | POA: Diagnosis not present

## 2014-10-20 DIAGNOSIS — N184 Chronic kidney disease, stage 4 (severe): Secondary | ICD-10-CM | POA: Diagnosis not present

## 2014-11-09 ENCOUNTER — Other Ambulatory Visit: Payer: Self-pay | Admitting: Family Medicine

## 2014-11-17 ENCOUNTER — Other Ambulatory Visit: Payer: Self-pay | Admitting: *Deleted

## 2014-11-17 ENCOUNTER — Telehealth: Payer: Self-pay | Admitting: Family Medicine

## 2014-11-17 MED ORDER — LORAZEPAM 1 MG PO TABS: 1.0000 mg | ORAL_TABLET | Freq: Every day | ORAL | Status: DC

## 2014-11-17 NOTE — Telephone Encounter (Signed)
Patient has transferred care to new PCP closer to home.

## 2014-11-17 NOTE — Telephone Encounter (Signed)
PA for Novolog Flexpen was denied.  Patient's plan prefers Humalog Kwikpen or Humalog Cartridge.

## 2014-11-18 ENCOUNTER — Other Ambulatory Visit: Payer: Self-pay | Admitting: *Deleted

## 2014-11-18 DIAGNOSIS — M545 Low back pain: Secondary | ICD-10-CM

## 2014-11-18 MED ORDER — HYDROCODONE-ACETAMINOPHEN 10-325 MG PO TABS: ORAL_TABLET | ORAL | Status: DC

## 2014-11-18 NOTE — Telephone Encounter (Signed)
rx ready for pick up and patient is aware  

## 2014-11-24 ENCOUNTER — Ambulatory Visit: Payer: Medicare Other | Admitting: Internal Medicine

## 2014-12-30 DIAGNOSIS — E119 Type 2 diabetes mellitus without complications: Secondary | ICD-10-CM | POA: Diagnosis not present

## 2014-12-30 DIAGNOSIS — J449 Chronic obstructive pulmonary disease, unspecified: Secondary | ICD-10-CM | POA: Diagnosis not present

## 2014-12-30 DIAGNOSIS — R05 Cough: Secondary | ICD-10-CM | POA: Diagnosis not present

## 2014-12-30 DIAGNOSIS — I10 Essential (primary) hypertension: Secondary | ICD-10-CM | POA: Diagnosis not present

## 2014-12-30 DIAGNOSIS — Z794 Long term (current) use of insulin: Secondary | ICD-10-CM | POA: Diagnosis not present

## 2015-01-06 ENCOUNTER — Other Ambulatory Visit: Payer: Self-pay | Admitting: Family Medicine

## 2015-01-06 DIAGNOSIS — M549 Dorsalgia, unspecified: Secondary | ICD-10-CM

## 2015-01-06 DIAGNOSIS — M5441 Lumbago with sciatica, right side: Secondary | ICD-10-CM

## 2015-01-06 DIAGNOSIS — M1990 Unspecified osteoarthritis, unspecified site: Secondary | ICD-10-CM

## 2015-01-11 DIAGNOSIS — E08329 Diabetes mellitus due to underlying condition with mild nonproliferative diabetic retinopathy without macular edema: Secondary | ICD-10-CM | POA: Diagnosis not present

## 2015-01-11 LAB — HM DIABETES EYE EXAM

## 2015-01-14 DIAGNOSIS — M47896 Other spondylosis, lumbar region: Secondary | ICD-10-CM | POA: Diagnosis not present

## 2015-01-14 DIAGNOSIS — Z79891 Long term (current) use of opiate analgesic: Secondary | ICD-10-CM | POA: Diagnosis not present

## 2015-01-15 ENCOUNTER — Encounter: Payer: Self-pay | Admitting: Family Medicine

## 2015-01-20 DIAGNOSIS — I1310 Hypertensive heart and chronic kidney disease without heart failure, with stage 1 through stage 4 chronic kidney disease, or unspecified chronic kidney disease: Secondary | ICD-10-CM | POA: Diagnosis not present

## 2015-01-20 DIAGNOSIS — N184 Chronic kidney disease, stage 4 (severe): Secondary | ICD-10-CM | POA: Diagnosis not present

## 2015-01-20 DIAGNOSIS — R609 Edema, unspecified: Secondary | ICD-10-CM | POA: Diagnosis not present

## 2015-01-21 DIAGNOSIS — M47896 Other spondylosis, lumbar region: Secondary | ICD-10-CM | POA: Diagnosis not present

## 2015-02-11 DIAGNOSIS — M47896 Other spondylosis, lumbar region: Secondary | ICD-10-CM | POA: Diagnosis not present

## 2015-02-13 ENCOUNTER — Other Ambulatory Visit: Payer: Self-pay | Admitting: Family Medicine

## 2015-03-09 DIAGNOSIS — M47896 Other spondylosis, lumbar region: Secondary | ICD-10-CM | POA: Diagnosis not present

## 2015-03-10 ENCOUNTER — Other Ambulatory Visit: Payer: Self-pay | Admitting: Family Medicine

## 2015-03-25 DIAGNOSIS — N250 Renal osteodystrophy: Secondary | ICD-10-CM | POA: Diagnosis not present

## 2015-03-25 DIAGNOSIS — N184 Chronic kidney disease, stage 4 (severe): Secondary | ICD-10-CM | POA: Diagnosis not present

## 2015-03-25 DIAGNOSIS — I1310 Hypertensive heart and chronic kidney disease without heart failure, with stage 1 through stage 4 chronic kidney disease, or unspecified chronic kidney disease: Secondary | ICD-10-CM | POA: Diagnosis not present

## 2015-04-08 DIAGNOSIS — E785 Hyperlipidemia, unspecified: Secondary | ICD-10-CM | POA: Diagnosis not present

## 2015-04-08 DIAGNOSIS — I2510 Atherosclerotic heart disease of native coronary artery without angina pectoris: Secondary | ICD-10-CM | POA: Diagnosis not present

## 2015-04-08 DIAGNOSIS — I119 Hypertensive heart disease without heart failure: Secondary | ICD-10-CM | POA: Diagnosis not present

## 2015-04-20 DIAGNOSIS — J45909 Unspecified asthma, uncomplicated: Secondary | ICD-10-CM | POA: Diagnosis not present

## 2015-04-20 DIAGNOSIS — J984 Other disorders of lung: Secondary | ICD-10-CM | POA: Diagnosis not present

## 2015-04-20 DIAGNOSIS — G4733 Obstructive sleep apnea (adult) (pediatric): Secondary | ICD-10-CM | POA: Diagnosis not present

## 2015-04-20 DIAGNOSIS — J310 Chronic rhinitis: Secondary | ICD-10-CM | POA: Diagnosis not present

## 2015-04-20 DIAGNOSIS — J449 Chronic obstructive pulmonary disease, unspecified: Secondary | ICD-10-CM | POA: Diagnosis not present

## 2015-04-20 DIAGNOSIS — I10 Essential (primary) hypertension: Secondary | ICD-10-CM | POA: Diagnosis not present

## 2015-04-27 ENCOUNTER — Other Ambulatory Visit: Payer: Self-pay

## 2015-04-27 MED ORDER — LABETALOL HCL 300 MG PO TABS: ORAL_TABLET | ORAL | Status: DC

## 2015-04-29 ENCOUNTER — Other Ambulatory Visit: Payer: Self-pay | Admitting: Family Medicine

## 2015-05-11 DIAGNOSIS — M47896 Other spondylosis, lumbar region: Secondary | ICD-10-CM | POA: Diagnosis not present

## 2015-05-17 ENCOUNTER — Other Ambulatory Visit: Payer: Self-pay | Admitting: Family Medicine

## 2015-06-24 ENCOUNTER — Other Ambulatory Visit: Payer: Self-pay | Admitting: *Deleted

## 2015-06-24 MED ORDER — GLIPIZIDE 10 MG PO TABS: ORAL_TABLET | ORAL | Status: DC

## 2015-07-10 DIAGNOSIS — G8929 Other chronic pain: Secondary | ICD-10-CM | POA: Diagnosis not present

## 2015-07-10 DIAGNOSIS — M545 Low back pain: Secondary | ICD-10-CM | POA: Diagnosis not present

## 2015-07-10 DIAGNOSIS — Z79891 Long term (current) use of opiate analgesic: Secondary | ICD-10-CM | POA: Diagnosis not present

## 2015-07-10 DIAGNOSIS — G4700 Insomnia, unspecified: Secondary | ICD-10-CM | POA: Diagnosis not present

## 2015-07-10 DIAGNOSIS — M47896 Other spondylosis, lumbar region: Secondary | ICD-10-CM | POA: Diagnosis not present

## 2015-07-19 ENCOUNTER — Other Ambulatory Visit: Payer: Self-pay | Admitting: Family Medicine

## 2015-07-19 MED ORDER — LABETALOL HCL 300 MG PO TABS: ORAL_TABLET | ORAL | Status: DC

## 2015-07-19 MED ORDER — SIMVASTATIN 40 MG PO TABS: 40.0000 mg | ORAL_TABLET | Freq: Every day | ORAL | Status: DC

## 2015-07-26 ENCOUNTER — Other Ambulatory Visit: Payer: Self-pay | Admitting: Family Medicine

## 2015-08-06 ENCOUNTER — Other Ambulatory Visit: Payer: Self-pay | Admitting: Family Medicine

## 2015-08-06 MED ORDER — INSULIN GLARGINE 100 UNIT/ML SOLOSTAR PEN: 40.0000 [IU] | PEN_INJECTOR | Freq: Two times a day (BID) | SUBCUTANEOUS | Status: DC

## 2015-08-06 NOTE — Addendum Note (Signed)
Addended by: Westley Hummer B on: 08/06/2015 08:31 AM   Modules accepted: Orders

## 2015-09-06 DIAGNOSIS — N184 Chronic kidney disease, stage 4 (severe): Secondary | ICD-10-CM | POA: Diagnosis not present

## 2015-09-06 DIAGNOSIS — I1310 Hypertensive heart and chronic kidney disease without heart failure, with stage 1 through stage 4 chronic kidney disease, or unspecified chronic kidney disease: Secondary | ICD-10-CM | POA: Diagnosis not present

## 2015-09-06 DIAGNOSIS — E559 Vitamin D deficiency, unspecified: Secondary | ICD-10-CM | POA: Diagnosis not present

## 2015-09-06 DIAGNOSIS — N250 Renal osteodystrophy: Secondary | ICD-10-CM | POA: Diagnosis not present

## 2015-09-10 DIAGNOSIS — G4700 Insomnia, unspecified: Secondary | ICD-10-CM | POA: Diagnosis not present

## 2015-09-10 DIAGNOSIS — G8929 Other chronic pain: Secondary | ICD-10-CM | POA: Diagnosis not present

## 2015-09-10 DIAGNOSIS — M47896 Other spondylosis, lumbar region: Secondary | ICD-10-CM | POA: Diagnosis not present

## 2015-10-11 ENCOUNTER — Other Ambulatory Visit: Payer: Self-pay | Admitting: Family Medicine

## 2015-10-25 ENCOUNTER — Other Ambulatory Visit: Payer: Self-pay | Admitting: Family Medicine

## 2015-11-02 ENCOUNTER — Ambulatory Visit (INDEPENDENT_AMBULATORY_CARE_PROVIDER_SITE_OTHER): Payer: Medicare Other | Admitting: Family Medicine

## 2015-11-02 ENCOUNTER — Encounter: Payer: Self-pay | Admitting: Family Medicine

## 2015-11-02 VITALS — BP 130/80 | Temp 98.0°F | Wt 301.0 lb

## 2015-11-02 DIAGNOSIS — E1165 Type 2 diabetes mellitus with hyperglycemia: Secondary | ICD-10-CM | POA: Diagnosis not present

## 2015-11-02 DIAGNOSIS — E1151 Type 2 diabetes mellitus with diabetic peripheral angiopathy without gangrene: Secondary | ICD-10-CM | POA: Diagnosis not present

## 2015-11-02 DIAGNOSIS — I10 Essential (primary) hypertension: Secondary | ICD-10-CM | POA: Diagnosis not present

## 2015-11-02 DIAGNOSIS — N182 Chronic kidney disease, stage 2 (mild): Secondary | ICD-10-CM | POA: Diagnosis not present

## 2015-11-02 DIAGNOSIS — IMO0002 Reserved for concepts with insufficient information to code with codable children: Secondary | ICD-10-CM

## 2015-11-02 DIAGNOSIS — E785 Hyperlipidemia, unspecified: Secondary | ICD-10-CM

## 2015-11-02 LAB — POCT URINALYSIS DIPSTICK
BILIRUBIN UA: NEGATIVE
Blood, UA: NEGATIVE
Glucose, UA: NEGATIVE
KETONES UA: NEGATIVE
Leukocytes, UA: NEGATIVE
Nitrite, UA: NEGATIVE
PH UA: 5.5
SPEC GRAV UA: 1.015
Urobilinogen, UA: 0.2

## 2015-11-02 LAB — CBC WITH DIFFERENTIAL/PLATELET
BASOS PCT: 0.3 % (ref 0.0–3.0)
Basophils Absolute: 0 10*3/uL (ref 0.0–0.1)
EOS PCT: 2.1 % (ref 0.0–5.0)
Eosinophils Absolute: 0.2 10*3/uL (ref 0.0–0.7)
HCT: 31.1 % — ABNORMAL LOW (ref 39.0–52.0)
Hemoglobin: 10.5 g/dL — ABNORMAL LOW (ref 13.0–17.0)
LYMPHS ABS: 2.1 10*3/uL (ref 0.7–4.0)
Lymphocytes Relative: 27.3 % (ref 12.0–46.0)
MCHC: 33.7 g/dL (ref 30.0–36.0)
MCV: 93 fl (ref 78.0–100.0)
MONO ABS: 0.6 10*3/uL (ref 0.1–1.0)
Monocytes Relative: 7.4 % (ref 3.0–12.0)
NEUTROS ABS: 4.7 10*3/uL (ref 1.4–7.7)
NEUTROS PCT: 62.9 % (ref 43.0–77.0)
Platelets: 178 10*3/uL (ref 150.0–400.0)
RBC: 3.35 Mil/uL — ABNORMAL LOW (ref 4.22–5.81)
RDW: 14.2 % (ref 11.5–15.5)
WBC: 7.5 10*3/uL (ref 4.0–10.5)

## 2015-11-02 LAB — BASIC METABOLIC PANEL
BUN: 46 mg/dL — AB (ref 6–23)
CHLORIDE: 101 meq/L (ref 96–112)
CO2: 29 mEq/L (ref 19–32)
Calcium: 9.1 mg/dL (ref 8.4–10.5)
Creatinine, Ser: 2.85 mg/dL — ABNORMAL HIGH (ref 0.40–1.50)
GFR: 23.96 mL/min — ABNORMAL LOW (ref >60.00)
GLUCOSE: 88 mg/dL (ref 70–99)
POTASSIUM: 4.4 meq/L (ref 3.5–5.1)
SODIUM: 142 meq/L (ref 135–145)

## 2015-11-02 LAB — MICROALBUMIN / CREATININE URINE RATIO
Creatinine,U: 71.3 mg/dL
Microalb Creat Ratio: 20.1 mg/g (ref 0.0–30.0)
Microalb, Ur: 14.3 mg/dL — ABNORMAL HIGH (ref 0.0–1.9)

## 2015-11-02 LAB — HEMOGLOBIN A1C: Hgb A1c MFr Bld: 6.6 % — ABNORMAL HIGH (ref 4.6–6.5)

## 2015-11-02 MED ORDER — ALLOPURINOL 300 MG PO TABS
ORAL_TABLET | ORAL | Status: DC
Start: 2015-11-02 — End: 2016-11-03

## 2015-11-02 MED ORDER — INSULIN GLARGINE 100 UNIT/ML SOLOSTAR PEN: PEN_INJECTOR | SUBCUTANEOUS | Status: DC

## 2015-11-02 MED ORDER — SIMVASTATIN 40 MG PO TABS: ORAL_TABLET | ORAL | Status: DC

## 2015-11-02 MED ORDER — GLIPIZIDE 10 MG PO TABS: ORAL_TABLET | ORAL | Status: DC

## 2015-11-02 MED ORDER — LABETALOL HCL 300 MG PO TABS: ORAL_TABLET | ORAL | Status: DC

## 2015-11-02 MED ORDER — INSULIN ASPART 100 UNIT/ML FLEXPEN: PEN_INJECTOR | SUBCUTANEOUS | Status: DC

## 2015-11-02 NOTE — Progress Notes (Signed)
Pre visit review using our clinic review tool, if applicable. No additional management support is needed unless otherwise documented below in the visit note. 

## 2015-11-02 NOTE — Progress Notes (Signed)
   Subjective:    Patient ID: Kent Osborne, male    DOB: June 30, 1951, 65 y.o.   MRN: [id number]  HPI Kent Osborne is a 65 year old divorce male nonsmoker who comes in today for follow-up of multiple issues  He's had a history of morbid obesity. He's lost from 329 pounds down to 301 pounds in the past 14 months. He has a cardiologist and a nephrologist in [place] where he lives. [facility]'s also [facility]. They have him on oxycodone 10 mg 3 times daily.  His last A1c 6 months ago was down to 6.7%. He takes Glucotrol 10 mg twice a day, Lantus 90 units at bedtime, short acting insulin sliding scale before each meal. Sometimes will take up to 90 units sometimes doesn't take anything at all. His insulin requirements of markedly diminished since he's lost 30 pounds.  He's on labetalol 600 mg 3 times daily because of hypertension. Repeated day 130/80  He's on Zocor 40 mg daily because of hyperlipidemia.  5 encouraged him to find a primary care physician in [place]. He says is a new clinic that's opened up he is clinically call them today   Review of Systems Review of systems otherwise negative    Objective:   Physical Exam  Well-developed obese male no acute distress vital signs stable except for weight of 301 pounds......... however it's down from 329 from last year      Assessment & Plan:  Diabetes type 1.......... continue current therapy check labs.......Marland Kitchen marked decrease in insulin requirements since he's lost the weight. He checks his blood sugar before each meal but he rarely uses the regular insulin because his sugars of normalized  Hypertension at goal........ continue current therapy  History of hyperlipidemia....... continue Zocor

## 2015-11-02 NOTE — Patient Instructions (Signed)
Continue diet program............... 30 pounds of weight loss is excellent keep up the good work  Continue other medications  We'll get her A1c today,,,,,,,,,, we may be able to cut down the Lantus at bedtime if her A1c is normal  Call the primary care clinic in [place] to discuss how to get established there............ let us know who you're going to see and when so we can send all your medical records.

## 2015-11-08 ENCOUNTER — Other Ambulatory Visit: Payer: Self-pay | Admitting: *Deleted

## 2015-11-08 MED ORDER — INSULIN GLARGINE 100 UNIT/ML SOLOSTAR PEN: PEN_INJECTOR | SUBCUTANEOUS | Status: DC

## 2015-11-16 DIAGNOSIS — I10 Essential (primary) hypertension: Secondary | ICD-10-CM | POA: Diagnosis not present

## 2015-11-16 DIAGNOSIS — E785 Hyperlipidemia, unspecified: Secondary | ICD-10-CM | POA: Diagnosis not present

## 2015-11-16 DIAGNOSIS — I2510 Atherosclerotic heart disease of native coronary artery without angina pectoris: Secondary | ICD-10-CM | POA: Diagnosis not present

## 2015-11-16 DIAGNOSIS — I872 Venous insufficiency (chronic) (peripheral): Secondary | ICD-10-CM | POA: Diagnosis not present

## 2015-11-17 ENCOUNTER — Other Ambulatory Visit: Payer: Self-pay | Admitting: *Deleted

## 2015-11-17 MED ORDER — INSULIN GLARGINE 100 UNIT/ML SOLOSTAR PEN: PEN_INJECTOR | SUBCUTANEOUS | Status: DC

## 2015-12-07 DIAGNOSIS — N250 Renal osteodystrophy: Secondary | ICD-10-CM | POA: Diagnosis not present

## 2015-12-07 DIAGNOSIS — N184 Chronic kidney disease, stage 4 (severe): Secondary | ICD-10-CM | POA: Diagnosis not present

## 2015-12-07 DIAGNOSIS — I1310 Hypertensive heart and chronic kidney disease without heart failure, with stage 1 through stage 4 chronic kidney disease, or unspecified chronic kidney disease: Secondary | ICD-10-CM | POA: Diagnosis not present

## 2015-12-29 ENCOUNTER — Other Ambulatory Visit: Payer: Self-pay | Admitting: *Deleted

## 2015-12-29 MED ORDER — LABETALOL HCL 300 MG PO TABS: ORAL_TABLET | ORAL | Status: DC

## 2016-01-03 DIAGNOSIS — M47812 Spondylosis without myelopathy or radiculopathy, cervical region: Secondary | ICD-10-CM | POA: Diagnosis not present

## 2016-01-03 DIAGNOSIS — M25562 Pain in left knee: Secondary | ICD-10-CM | POA: Diagnosis not present

## 2016-01-03 DIAGNOSIS — Z79891 Long term (current) use of opiate analgesic: Secondary | ICD-10-CM | POA: Diagnosis not present

## 2016-01-03 DIAGNOSIS — Z79899 Other long term (current) drug therapy: Secondary | ICD-10-CM | POA: Diagnosis not present

## 2016-01-03 DIAGNOSIS — G894 Chronic pain syndrome: Secondary | ICD-10-CM | POA: Diagnosis not present

## 2016-01-03 DIAGNOSIS — M542 Cervicalgia: Secondary | ICD-10-CM | POA: Diagnosis not present

## 2016-01-03 DIAGNOSIS — M25569 Pain in unspecified knee: Secondary | ICD-10-CM | POA: Diagnosis not present

## 2016-01-03 DIAGNOSIS — M545 Low back pain: Secondary | ICD-10-CM | POA: Diagnosis not present

## 2016-01-03 DIAGNOSIS — M25561 Pain in right knee: Secondary | ICD-10-CM | POA: Diagnosis not present

## 2016-01-03 DIAGNOSIS — M47896 Other spondylosis, lumbar region: Secondary | ICD-10-CM | POA: Diagnosis not present

## 2016-01-03 DIAGNOSIS — M5136 Other intervertebral disc degeneration, lumbar region: Secondary | ICD-10-CM | POA: Diagnosis not present

## 2016-01-06 DIAGNOSIS — E785 Hyperlipidemia, unspecified: Secondary | ICD-10-CM | POA: Diagnosis not present

## 2016-01-06 DIAGNOSIS — Z794 Long term (current) use of insulin: Secondary | ICD-10-CM | POA: Diagnosis not present

## 2016-01-06 DIAGNOSIS — I214 Non-ST elevation (NSTEMI) myocardial infarction: Secondary | ICD-10-CM | POA: Diagnosis not present

## 2016-01-06 DIAGNOSIS — N184 Chronic kidney disease, stage 4 (severe): Secondary | ICD-10-CM | POA: Diagnosis not present

## 2016-01-06 DIAGNOSIS — G4733 Obstructive sleep apnea (adult) (pediatric): Secondary | ICD-10-CM | POA: Diagnosis not present

## 2016-01-06 DIAGNOSIS — I10 Essential (primary) hypertension: Secondary | ICD-10-CM | POA: Diagnosis not present

## 2016-01-06 DIAGNOSIS — I447 Left bundle-branch block, unspecified: Secondary | ICD-10-CM | POA: Diagnosis present

## 2016-01-06 DIAGNOSIS — J449 Chronic obstructive pulmonary disease, unspecified: Secondary | ICD-10-CM | POA: Diagnosis present

## 2016-01-06 DIAGNOSIS — I129 Hypertensive chronic kidney disease with stage 1 through stage 4 chronic kidney disease, or unspecified chronic kidney disease: Secondary | ICD-10-CM | POA: Diagnosis not present

## 2016-01-06 DIAGNOSIS — I739 Peripheral vascular disease, unspecified: Secondary | ICD-10-CM | POA: Diagnosis not present

## 2016-01-06 DIAGNOSIS — Z955 Presence of coronary angioplasty implant and graft: Secondary | ICD-10-CM | POA: Diagnosis not present

## 2016-01-06 DIAGNOSIS — E1122 Type 2 diabetes mellitus with diabetic chronic kidney disease: Secondary | ICD-10-CM | POA: Diagnosis not present

## 2016-01-06 DIAGNOSIS — I872 Venous insufficiency (chronic) (peripheral): Secondary | ICD-10-CM | POA: Diagnosis not present

## 2016-01-06 DIAGNOSIS — J45909 Unspecified asthma, uncomplicated: Secondary | ICD-10-CM | POA: Diagnosis present

## 2016-01-06 DIAGNOSIS — I2510 Atherosclerotic heart disease of native coronary artery without angina pectoris: Secondary | ICD-10-CM | POA: Diagnosis not present

## 2016-01-13 DIAGNOSIS — I2510 Atherosclerotic heart disease of native coronary artery without angina pectoris: Secondary | ICD-10-CM | POA: Diagnosis not present

## 2016-01-13 DIAGNOSIS — N184 Chronic kidney disease, stage 4 (severe): Secondary | ICD-10-CM | POA: Diagnosis not present

## 2016-01-13 DIAGNOSIS — I872 Venous insufficiency (chronic) (peripheral): Secondary | ICD-10-CM | POA: Diagnosis not present

## 2016-01-13 DIAGNOSIS — E785 Hyperlipidemia, unspecified: Secondary | ICD-10-CM | POA: Diagnosis not present

## 2016-01-13 DIAGNOSIS — I10 Essential (primary) hypertension: Secondary | ICD-10-CM | POA: Diagnosis not present

## 2016-02-03 DIAGNOSIS — M25569 Pain in unspecified knee: Secondary | ICD-10-CM | POA: Diagnosis not present

## 2016-02-03 DIAGNOSIS — M47812 Spondylosis without myelopathy or radiculopathy, cervical region: Secondary | ICD-10-CM | POA: Diagnosis not present

## 2016-02-03 DIAGNOSIS — M47896 Other spondylosis, lumbar region: Secondary | ICD-10-CM | POA: Diagnosis not present

## 2016-02-03 DIAGNOSIS — G894 Chronic pain syndrome: Secondary | ICD-10-CM | POA: Diagnosis not present

## 2016-02-03 DIAGNOSIS — Z79899 Other long term (current) drug therapy: Secondary | ICD-10-CM | POA: Diagnosis not present

## 2016-02-03 DIAGNOSIS — M5136 Other intervertebral disc degeneration, lumbar region: Secondary | ICD-10-CM | POA: Diagnosis not present

## 2016-02-07 DIAGNOSIS — E083292 Diabetes mellitus due to underlying condition with mild nonproliferative diabetic retinopathy without macular edema, left eye: Secondary | ICD-10-CM | POA: Diagnosis not present

## 2016-03-27 DIAGNOSIS — E083313 Diabetes mellitus due to underlying condition with moderate nonproliferative diabetic retinopathy with macular edema, bilateral: Secondary | ICD-10-CM | POA: Diagnosis not present

## 2016-04-05 DIAGNOSIS — N184 Chronic kidney disease, stage 4 (severe): Secondary | ICD-10-CM | POA: Diagnosis not present

## 2016-04-05 DIAGNOSIS — I1310 Hypertensive heart and chronic kidney disease without heart failure, with stage 1 through stage 4 chronic kidney disease, or unspecified chronic kidney disease: Secondary | ICD-10-CM | POA: Diagnosis not present

## 2016-04-05 DIAGNOSIS — N250 Renal osteodystrophy: Secondary | ICD-10-CM | POA: Diagnosis not present

## 2016-04-18 DIAGNOSIS — I10 Essential (primary) hypertension: Secondary | ICD-10-CM | POA: Diagnosis not present

## 2016-04-18 DIAGNOSIS — J310 Chronic rhinitis: Secondary | ICD-10-CM | POA: Diagnosis not present

## 2016-04-18 DIAGNOSIS — G4733 Obstructive sleep apnea (adult) (pediatric): Secondary | ICD-10-CM | POA: Diagnosis not present

## 2016-04-27 ENCOUNTER — Encounter: Payer: Self-pay | Admitting: Family Medicine

## 2016-04-27 ENCOUNTER — Ambulatory Visit (INDEPENDENT_AMBULATORY_CARE_PROVIDER_SITE_OTHER): Payer: Medicare Other | Admitting: Family Medicine

## 2016-04-27 VITALS — BP 138/74 | HR 60 | Resp 12 | Ht 67.0 in | Wt 298.0 lb

## 2016-04-27 DIAGNOSIS — E1142 Type 2 diabetes mellitus with diabetic polyneuropathy: Secondary | ICD-10-CM | POA: Diagnosis not present

## 2016-04-27 DIAGNOSIS — M1009 Idiopathic gout, multiple sites: Secondary | ICD-10-CM | POA: Diagnosis not present

## 2016-04-27 DIAGNOSIS — E1165 Type 2 diabetes mellitus with hyperglycemia: Secondary | ICD-10-CM | POA: Diagnosis not present

## 2016-04-27 DIAGNOSIS — Z23 Encounter for immunization: Secondary | ICD-10-CM | POA: Diagnosis not present

## 2016-04-27 DIAGNOSIS — IMO0002 Reserved for concepts with insufficient information to code with codable children: Secondary | ICD-10-CM

## 2016-04-27 DIAGNOSIS — R1012 Left upper quadrant pain: Secondary | ICD-10-CM | POA: Diagnosis not present

## 2016-04-27 DIAGNOSIS — E1151 Type 2 diabetes mellitus with diabetic peripheral angiopathy without gangrene: Secondary | ICD-10-CM

## 2016-04-27 DIAGNOSIS — I10 Essential (primary) hypertension: Secondary | ICD-10-CM | POA: Diagnosis not present

## 2016-04-27 DIAGNOSIS — M109 Gout, unspecified: Secondary | ICD-10-CM | POA: Insufficient documentation

## 2016-04-27 DIAGNOSIS — Z6841 Body Mass Index (BMI) 40.0 and over, adult: Secondary | ICD-10-CM

## 2016-04-27 LAB — HEPATIC FUNCTION PANEL
ALBUMIN: 4.1 g/dL (ref 3.5–5.2)
ALT: 23 U/L (ref 0–53)
AST: 20 U/L (ref 0–37)
Alkaline Phosphatase: 57 U/L (ref 39–117)
BILIRUBIN DIRECT: 0.2 mg/dL (ref 0.0–0.3)
TOTAL PROTEIN: 6.9 g/dL (ref 6.0–8.3)
Total Bilirubin: 0.8 mg/dL (ref 0.2–1.2)

## 2016-04-27 LAB — HEMOGLOBIN A1C: Hgb A1c MFr Bld: 6 % (ref 4.6–6.5)

## 2016-04-27 LAB — URIC ACID: Uric Acid, Serum: 7 mg/dL (ref 4.0–7.8)

## 2016-04-27 MED ORDER — RANITIDINE HCL 300 MG PO TABS: 300.0000 mg | ORAL_TABLET | Freq: Every day | ORAL | 2 refills | Status: DC

## 2016-04-27 NOTE — Patient Instructions (Addendum)
A few things to remember from today's visit:   Essential hypertension - Plan: Hepatic Function Panel  Diabetic peripheral neuropathy associated with type 2 diabetes mellitus (HCC)  Type II diabetes mellitus with circulatory disorder, uncontrolled - Plan: Hemoglobin A1c  Abdominal pain, LUQ - Plan: Hepatic Function Panel  Gout, arthropathy - Plan: Uric acid  HgA1C goal < 7.0. Avoid sugar added food:regular soft drinks, energy drinks, and sports drinks. candy. cakes. cookies. pies and cobblers. sweet rolls, pastries, and donuts. fruit drinks, such as fruitades and fruit punch. dairy desserts, such as ice cream  Mediterranean diet has showed benefits for sugar control.  How much and what type of carbohydrate foods are important for managing diabetes. The balance between how much insulin is in your body and the carbohydrate you eat makes a difference in your blood glucose levels.  Fasting blood sugar ideally 130 or less, 2 hours after meals less than 180.   Regular exercise also will help with controlling disease, daily brisk walking as tolerated for 15-30 min definitively will help.    Avoid skipping meals, blood sugar might drop and cause serious problems. Remember checking feet periodically, good dental hygiene, and annual eye exam.  -- Please let me know if you decide to have the colonoscopy done, your needed at her age. I think pain in your upper abdomen is related to inflammation of the duodenum, Zantac might help with nausea and pain.   Avoid foods that make your symptoms worse, for example coffee, chocolate,pepermeint,alcohol, and greasy food. Raising the head of your bed about 6 inches may help with nocturnal symptoms.   Weight loss (if you are overweight). Avoid lying down for 3 hours after eating.  Instead 3 large meals daily try small and more frequent meals during the day.  Some medications we recommend for acid reflux treatment (proton pump inhibitors) can cause  some problems in the long term: increase risk of osteoporosis, vitamin deficiencies,pneumonia, and more recently discovered that it can increase the risk of chronic kidney disease and might increase risk of dementia.  You should be evaluated immediately if bloody vomiting, bloody stools, black stools (like tar), difficulty swallowing, food gets stuck on the way down or choking when eating. Abnormal weight loss or severe abdominal pain.  If symptoms are not resolved sometimes endoscopy is necessary.   Please be sure medication list is accurate. If a new problem present, please set up appointment sooner than planned today.

## 2016-04-27 NOTE — Progress Notes (Signed)
HPI:   [name] is a 65 y.o. male, who is here today to establish care with me.  Former PCP: Dr Kent Osborne Last preventive routine visit: 2016.    Concerns today: abdominal pain  2 months of upper abdominal pain, points LUQ pain, mild "sore under skin", no rash or skin changes. Intermittently, upon touch mainly, it also seems to be exacerbated by grapes intake. No pain or discomfort if he avoids palpation.  + Nausea and vomiting the day abdominal discomfort started + diaphoresis once same day, he thought it was a viral illness. + Loose stools, 1-2 stools daily, better for the past 2 days.  He also had exacerbation of left thoracic pain around same time, not sure about radiation. Hx of back pain, now at baseline.   Still nauseated, usually in the morning and gets better with eating breakfast.  Denies vomiting, blood in stool or melena. He has never had a colonoscopy done.  No urinary symptoms.  No travel or sick contact or abx use around the time of onset.    Diabetes Mellitus II:   Currently on Lantus 45 U bid, Humalog sliding scale, and Glipizide 10 mg in the afternoon or at bedtime depending of BS's. . Problem has been stable. Checking BS's : Yes, 80's-100 FG and 150-170's post prandial. Hypoglycemia: Denies, lower BS mid 70's,occasionally. He skips meals frequently, today he has not eaten lunch. He does not exercise regularly. He tries to follow recommended diet.  He is tolerating medications well. He denies polydipsia, polyuria, or polyphagia. + Bilateral foot numbness, tingling, and burning, Hx of peripheral neuropathy, he tried Gabapentin in the past but discontinued because it worsened lower extremity edema.    Lab Results  Component Value Date   CREATININE 2.85 (H) 11/02/2015   BUN 46 (H) 11/02/2015   NA 142 11/02/2015   K 4.4 11/02/2015   CL 101 11/02/2015   CO2 29 11/02/2015    Lab Results  Component Value Date   HGBA1C 6.6 (H)  11/02/2015   Lab Results  Component Value Date   MICROALBUR 14.3 (H) 11/02/2015   Eye exam a month ago, diabetic retinopathy bilateral, 3 months f/u recommended.   Hx of CKD IV, he follows with nephrologist, Dr. Marlowe Osborne, who she last saw about a month ago. Hx of CAD, he follows with cardiologist, Dr. Alroy Osborne , last seen a month ago. According to patient he had labs done, including lipid panel. History of hyperlipidemia, currently he is on Simvastatin 40 mg daily.  Hx of OSA, he wears CPAP with 2 L of supplemental oxygen, he follows with pulmonologist, last seen a week ago.  Hypertension: Currently he is on Hydralazine 25 mg 3 times per day and Labetalol 600 mg 3 times per day. He also takes Torsemide 20 mg twice daily.  Denies severe/frequent headache, visual changes, chest pain, dyspnea, palpitation, claudication, focal weakness, or worsening edema.  History of allergies, he is on Singulair 10 mg daily. Remote history of asthma also reported today. Former smoker.  Gout: States that he has not had a gout attack for years, currently he is on allopurinol 150 mg daily. He also takes Colchicine if needed.  -History of chronic back pain, stable overall.    Review of Systems  Constitutional: Negative for activity change, appetite change, fatigue, fever and unexpected weight change.  HENT: Negative for dental problem, mouth sores, nosebleeds, sore throat and trouble swallowing.   Eyes: Negative for pain, redness and visual  disturbance.  Respiratory: Positive for apnea. Negative for cough, shortness of breath and wheezing.   Cardiovascular: Negative for chest pain and palpitations.  Gastrointestinal: Positive for abdominal pain, diarrhea and nausea. Negative for blood in stool and vomiting.  Endocrine: Negative for polydipsia, polyphagia and polyuria.  Genitourinary: Negative for decreased urine volume, difficulty urinating and hematuria.  Musculoskeletal: Positive for arthralgias  and back pain. Negative for myalgias.  Skin: Negative for color change, rash and wound.  Allergic/Immunologic: Positive for environmental allergies.  Neurological: Positive for numbness. Negative for dizziness, seizures, syncope, weakness and headaches.  Psychiatric/Behavioral: Negative for confusion. The patient is not nervous/anxious.       Current Outpatient Prescriptions on File Prior to Visit  Medication Sig Dispense Refill  . allopurinol (ZYLOPRIM) 300 MG tablet TAKE ONE-HALF TABLET BY MOUTH IN THE MORNING 100 tablet 3  . aspirin 81 MG tablet Take 81 mg by mouth daily.      . colchicine 0.6 MG tablet Take 1 tablet (0.6 mg total) by mouth daily. 90 tablet 3  . glipiZIDE (GLUCOTROL) 10 MG tablet TAKE ONE TABLET BY MOUTH TWICE DAILY BEFORE  A  MEAL 200 tablet 3  . hydrALAZINE (APRESOLINE) 25 MG tablet Take 25 mg by mouth 3 (three) times daily.    . insulin aspart (NOVOLOG FLEXPEN) 100 UNIT/ML FlexPen INJECT 90 UNITS SUBCUTANEOUSLY ONCE DAILY 90 mL 5  . Insulin Glargine (LANTUS SOLOSTAR) 100 UNIT/ML Solostar Pen INJECT 90 UNITS SUBQ AT BEDTIME 90 mL 11  . labetalol (NORMODYNE) 300 MG tablet TAKE TWO TABLETS BY MOUTH IN THE MORNING AND ONE AT NOON AND TWO AT BEDTIME - OFFICE VISIT NEEDED FOR FURTHER REFILLS 500 tablet 3  . montelukast (SINGULAIR) 10 MG tablet     . NONFORMULARY OR COMPOUNDED ITEM Neuropathy cream: mix Amitriptyline 2%, Lidocaine 2%, and Ketamine 1% - apply on feet 2x a day 1 each 2  . ONE TOUCH ULTRA TEST test strip USE  STRIP TO CHECK GLUCOSE 4 TIMES DAILY 400 each 3  . prasugrel (EFFIENT) 10 MG TABS Take 10 mg by mouth daily.      . simvastatin (ZOCOR) 40 MG tablet TAKE ONE TABLET BY MOUTH ONCE DAILY WITH BREAKFAST - OFFICE VISIT NEEDED FOR FURTHER REFILLS 100 tablet 3  . torsemide (DEMADEX) 20 MG tablet TAKE ONE TABLET BY MOUTH TWICE DAILY 200 tablet 3  . triamcinolone (NASACORT) 55 MCG/ACT AERO nasal inhaler      No current facility-administered medications on file  prior to visit.      Past Medical History:  Diagnosis Date  . Asthma   . CAD (coronary artery disease) 12/31/07   [facility] stents- placed in the circumflex and LAD  . Diabetes mellitus   . Hyperlipidemia   . Hypertension   . Nephrolithiasis   . OA (osteoarthritis)   . Obese    No Known Allergies  Family History  Problem Relation Age of Onset  . Diabetes Other   . Hyperlipidemia Other   . Hypertension Other     Social History   Social History  . Marital status: Married    Spouse name: N/A  . Number of children: N/A  . Years of education: N/A   Social History Main Topics  . Smoking status: Former Research scientist (life sciences)  . Smokeless tobacco: None  . Alcohol use No  . Drug use: No  . Sexual activity: Not Asked   Other Topics Concern  . None   Social History Narrative  . None  Vitals:   04/27/16 1338  BP: 138/74  Pulse: 60  Resp: 12   O2 sat at RA 95%.   Body mass index is 46.67 kg/m.    Physical Exam  Nursing note and vitals reviewed. Constitutional: He is oriented to person, place, and time. He appears well-developed. No distress.  HENT:  Head: Atraumatic.  Mouth/Throat: Oropharynx is clear and moist and mucous membranes are normal.  Eyes: Conjunctivae and EOM are normal. Pupils are equal, round, and reactive to light.  Neck: No JVD present. No thyroid mass and no thyromegaly present.  Cardiovascular: Normal rate and regular rhythm.   No murmur heard. Pulses:      Dorsalis pedis pulses are 2+ on the right side, and 2+ on the left side.  Respiratory: Effort normal and breath sounds normal. No respiratory distress.  GI: Soft. He exhibits no mass. There is no hepatomegaly. There is no tenderness.  Musculoskeletal: He exhibits edema (1+ pitting LE edema LE bilateral.). He exhibits no tenderness.  Lymphadenopathy:    He has no cervical adenopathy.  Neurological: He is alert and oriented to person, place, and time. He has normal strength. Coordination  and gait normal.  Skin: Skin is warm. No rash noted. No erythema.  Psychiatric: He has a normal mood and affect.  Well groomed, good eye contact.   Diabetic foot exam:  Monofilament normal bilateral. Peripheral pulses present (DP). No calluses + hypertrophic/long toenails.   ASSESSMENT AND PLAN:    Terance was seen today for transfer.  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 No changes in current management, will adjust treatment as needed. Glipizide recommended in the morning 20 min before breakfast.  Regular exercise and healthy diet with avoidance of added sugar food intake is an important part of treatment and recommended. Annual eye exam, periodic dental and foot care recommended. F/U in 5-6 months  -     Hemoglobin A1c  Diabetic peripheral neuropathy associated with type 2 diabetes mellitus ([place])  Foot care, good DM controlled, and appropriate she wear discussed. He did not tolerated Gabapentin, not sure if he has tried Lyrica. Later we could consider Lyrica or Cymbalta, the latter one could be a better option given his Hx of lower back pain.  Essential hypertension  Adequately controlled. No changes in current management. DASH-low salt diet recommended. He had labs done a month ago during visit with nephrologists. Eye exam recommended annually. F/U in 6 months, before if needed.  -     Hepatic Function Panel  Abdominal pain, LUQ  We discussed possible etiologies including radicular,neuropathic pain,GI ? Dyspepsia,duodenitis. He will try Zantac and monitor for changes. Avoid big meals,greasy, and spicy food. Instructed about warning signs. F/U in 2  months.  -     Hepatic Function Panel -     ranitidine (ZANTAC) 300 MG tablet; Take 1 tablet (300 mg total) by mouth at bedtime.  Gout, arthropathy  Asymptomatic. No changes in current management, may adjust depending of lab result. F/U in 6-12 months.  -     Uric acid  BMI 45.0-49.9, adult ([place])  We discussed benefits of wt loss as well as adverse  effects of obesity. Consistency with healthy diet and physical activity recommended. Weight Watchers is a good option as well as daily brisk walking for 15-30 min as tolerated.   Need for influenza vaccination -     Cancel: Flu Vaccine QUAD 36+ mos IM -     Flu Vaccine QUAD 36+ mos IM        Fenix Ruppe G. Martin[place], MD  [facility]. [place] office.

## 2016-04-27 NOTE — Progress Notes (Signed)
Pre visit review using our clinic review tool, if applicable. No additional management support is needed unless otherwise documented below in the visit note. 

## 2016-04-29 DIAGNOSIS — Z6841 Body Mass Index (BMI) 40.0 and over, adult: Secondary | ICD-10-CM

## 2016-05-02 DIAGNOSIS — I517 Cardiomegaly: Secondary | ICD-10-CM | POA: Diagnosis not present

## 2016-05-02 DIAGNOSIS — R1012 Left upper quadrant pain: Secondary | ICD-10-CM | POA: Diagnosis not present

## 2016-05-02 DIAGNOSIS — Z955 Presence of coronary angioplasty implant and graft: Secondary | ICD-10-CM | POA: Diagnosis not present

## 2016-05-02 DIAGNOSIS — I129 Hypertensive chronic kidney disease with stage 1 through stage 4 chronic kidney disease, or unspecified chronic kidney disease: Secondary | ICD-10-CM | POA: Diagnosis not present

## 2016-05-02 DIAGNOSIS — N189 Chronic kidney disease, unspecified: Secondary | ICD-10-CM | POA: Diagnosis not present

## 2016-05-05 DIAGNOSIS — G8929 Other chronic pain: Secondary | ICD-10-CM | POA: Diagnosis not present

## 2016-05-05 DIAGNOSIS — G4700 Insomnia, unspecified: Secondary | ICD-10-CM | POA: Diagnosis not present

## 2016-05-05 DIAGNOSIS — M47896 Other spondylosis, lumbar region: Secondary | ICD-10-CM | POA: Diagnosis not present

## 2016-05-20 DIAGNOSIS — Z794 Long term (current) use of insulin: Secondary | ICD-10-CM | POA: Diagnosis not present

## 2016-05-20 DIAGNOSIS — E119 Type 2 diabetes mellitus without complications: Secondary | ICD-10-CM | POA: Diagnosis not present

## 2016-05-20 DIAGNOSIS — R1012 Left upper quadrant pain: Secondary | ICD-10-CM | POA: Diagnosis not present

## 2016-05-20 DIAGNOSIS — Z7982 Long term (current) use of aspirin: Secondary | ICD-10-CM | POA: Diagnosis not present

## 2016-05-20 DIAGNOSIS — Z79899 Other long term (current) drug therapy: Secondary | ICD-10-CM | POA: Diagnosis not present

## 2016-05-20 DIAGNOSIS — Z79891 Long term (current) use of opiate analgesic: Secondary | ICD-10-CM | POA: Diagnosis not present

## 2016-05-20 DIAGNOSIS — I10 Essential (primary) hypertension: Secondary | ICD-10-CM | POA: Diagnosis not present

## 2016-05-20 DIAGNOSIS — Z7984 Long term (current) use of oral hypoglycemic drugs: Secondary | ICD-10-CM | POA: Diagnosis not present

## 2016-05-28 DIAGNOSIS — R079 Chest pain, unspecified: Secondary | ICD-10-CM | POA: Diagnosis not present

## 2016-05-28 DIAGNOSIS — M25512 Pain in left shoulder: Secondary | ICD-10-CM | POA: Diagnosis not present

## 2016-05-28 DIAGNOSIS — M79602 Pain in left arm: Secondary | ICD-10-CM | POA: Diagnosis not present

## 2016-05-28 DIAGNOSIS — I10 Essential (primary) hypertension: Secondary | ICD-10-CM | POA: Diagnosis not present

## 2016-05-28 DIAGNOSIS — Z794 Long term (current) use of insulin: Secondary | ICD-10-CM | POA: Diagnosis not present

## 2016-05-28 DIAGNOSIS — Z7982 Long term (current) use of aspirin: Secondary | ICD-10-CM | POA: Diagnosis not present

## 2016-05-28 DIAGNOSIS — N289 Disorder of kidney and ureter, unspecified: Secondary | ICD-10-CM | POA: Diagnosis not present

## 2016-05-28 DIAGNOSIS — Z79899 Other long term (current) drug therapy: Secondary | ICD-10-CM | POA: Diagnosis not present

## 2016-05-28 DIAGNOSIS — E119 Type 2 diabetes mellitus without complications: Secondary | ICD-10-CM | POA: Diagnosis not present

## 2016-06-03 DIAGNOSIS — I2510 Atherosclerotic heart disease of native coronary artery without angina pectoris: Secondary | ICD-10-CM | POA: Diagnosis not present

## 2016-06-03 DIAGNOSIS — E119 Type 2 diabetes mellitus without complications: Secondary | ICD-10-CM | POA: Diagnosis not present

## 2016-06-03 DIAGNOSIS — Z955 Presence of coronary angioplasty implant and graft: Secondary | ICD-10-CM | POA: Diagnosis not present

## 2016-06-03 DIAGNOSIS — M542 Cervicalgia: Secondary | ICD-10-CM | POA: Diagnosis not present

## 2016-06-03 DIAGNOSIS — Z7982 Long term (current) use of aspirin: Secondary | ICD-10-CM | POA: Diagnosis not present

## 2016-06-03 DIAGNOSIS — Z794 Long term (current) use of insulin: Secondary | ICD-10-CM | POA: Diagnosis not present

## 2016-06-03 DIAGNOSIS — N184 Chronic kidney disease, stage 4 (severe): Secondary | ICD-10-CM | POA: Diagnosis not present

## 2016-06-03 DIAGNOSIS — I129 Hypertensive chronic kidney disease with stage 1 through stage 4 chronic kidney disease, or unspecified chronic kidney disease: Secondary | ICD-10-CM | POA: Diagnosis not present

## 2016-06-03 DIAGNOSIS — M4722 Other spondylosis with radiculopathy, cervical region: Secondary | ICD-10-CM | POA: Diagnosis not present

## 2016-06-03 DIAGNOSIS — Z79899 Other long term (current) drug therapy: Secondary | ICD-10-CM | POA: Diagnosis not present

## 2016-06-03 DIAGNOSIS — Z8249 Family history of ischemic heart disease and other diseases of the circulatory system: Secondary | ICD-10-CM | POA: Diagnosis not present

## 2016-06-26 DIAGNOSIS — E083313 Diabetes mellitus due to underlying condition with moderate nonproliferative diabetic retinopathy with macular edema, bilateral: Secondary | ICD-10-CM | POA: Diagnosis not present

## 2016-06-29 ENCOUNTER — Encounter: Payer: Self-pay | Admitting: Family Medicine

## 2016-06-29 ENCOUNTER — Ambulatory Visit (INDEPENDENT_AMBULATORY_CARE_PROVIDER_SITE_OTHER): Payer: Medicare Other | Admitting: Family Medicine

## 2016-06-29 VITALS — BP 102/60 | HR 63 | Temp 98.2°F | Resp 12 | Ht 67.0 in | Wt 299.4 lb

## 2016-06-29 DIAGNOSIS — I10 Essential (primary) hypertension: Secondary | ICD-10-CM | POA: Diagnosis not present

## 2016-06-29 DIAGNOSIS — Z6841 Body Mass Index (BMI) 40.0 and over, adult: Secondary | ICD-10-CM | POA: Diagnosis not present

## 2016-06-29 DIAGNOSIS — M792 Neuralgia and neuritis, unspecified: Secondary | ICD-10-CM

## 2016-06-29 DIAGNOSIS — R1012 Left upper quadrant pain: Secondary | ICD-10-CM | POA: Diagnosis not present

## 2016-06-29 NOTE — Patient Instructions (Signed)
A few things to remember from today's visit:   Radicular pain of left upper extremity  Essential hypertension  LUQ abdominal pain  Pain resolved. If arm pain again MRI is going to be necessary. Let me know about insulin changes.  Monitor blood pressure at home because mildly low today.   Please be sure medication list is accurate. If a new problem present, please set up appointment sooner than planned today.

## 2016-06-29 NOTE — Progress Notes (Signed)
HPI:   [name] is a 65 y.o. male, who is here today to follow on last OV, 04/27/16, when he was c/o 2 months of LUQ abdominal pain, mild soreness sensation. I recommended Zantac 150 mg at bedtime. He was also having loose stools, nausea, and vomiting. He has not changed dietary habits. Reports that pain and other symptoms have resolved.  Denies nausea, vomiting, blood in stool or melena.     Concerns today: LUE pain.  Since his last OV he was in the ER because throbbing pain on left upper extremity, that extended from shoulder to forearm, pain was "12/10", also left cervical pain. No Hx of trauma and no rash.  Prednisone taper ws prescribed, pain resolved but once he stopped medication pain re-occurred, so he took some Prednisone tabs he had left for prior Rx and pain again resolved, this time it did not come back after completing treatment. Currently he is asymptomatic. Hx of peripheral neuropathy. Hx of DM II, BS's "good."  Hx of lower back pain, he has appt with orhto next month.   Hx of HTN, today BP on lower normal range, reporting BP at home mildly elevated a few days ago when he was on prednisone.  Denies severe/frequent headache, visual changes, chest pain, dyspnea, palpitation, claudication, focal weakness, or worsening edema.   Review of Systems  Constitutional: Negative for activity change, appetite change, fatigue, fever and unexpected weight change.  HENT: Negative for nosebleeds, sore throat and trouble swallowing.   Respiratory: Negative for cough, shortness of breath and wheezing.   Cardiovascular: Positive for leg swelling (baseline). Negative for chest pain and palpitations.  Gastrointestinal: Negative for abdominal pain, nausea and vomiting.  Genitourinary: Negative for decreased urine volume, dysuria and hematuria.  Musculoskeletal: Positive for neck pain.  Skin: Negative for color change and rash.  Neurological: Negative for  dizziness, syncope, weakness and headaches.  Psychiatric/Behavioral: Negative for confusion. The patient is not nervous/anxious.       Current Outpatient Prescriptions on File Prior to Visit  Medication Sig Dispense Refill  . allopurinol (ZYLOPRIM) 300 MG tablet TAKE ONE-HALF TABLET BY MOUTH IN THE MORNING 100 tablet 3  . aspirin 81 MG tablet Take 81 mg by mouth daily.      . colchicine 0.6 MG tablet Take 1 tablet (0.6 mg total) by mouth daily. 90 tablet 3  . glipiZIDE (GLUCOTROL) 10 MG tablet TAKE ONE TABLET BY MOUTH TWICE DAILY BEFORE  A  MEAL 200 tablet 3  . hydrALAZINE (APRESOLINE) 25 MG tablet Take 25 mg by mouth 3 (three) times daily.    . insulin aspart (NOVOLOG FLEXPEN) 100 UNIT/ML FlexPen INJECT 90 UNITS SUBCUTANEOUSLY ONCE DAILY 90 mL 5  . Insulin Glargine (LANTUS SOLOSTAR) 100 UNIT/ML Solostar Pen INJECT 90 UNITS SUBQ AT BEDTIME 90 mL 11  . labetalol (NORMODYNE) 300 MG tablet TAKE TWO TABLETS BY MOUTH IN THE MORNING AND ONE AT NOON AND TWO AT BEDTIME - OFFICE VISIT NEEDED FOR FURTHER REFILLS 500 tablet 3  . montelukast (SINGULAIR) 10 MG tablet     . NONFORMULARY OR COMPOUNDED ITEM Neuropathy cream: mix Amitriptyline 2%, Lidocaine 2%, and Ketamine 1% - apply on feet 2x a day 1 each 2  . ONE TOUCH ULTRA TEST test strip USE  STRIP TO CHECK GLUCOSE 4 TIMES DAILY 400 each 3  . prasugrel (EFFIENT) 10 MG TABS Take 10 mg by mouth daily.      . ranitidine (ZANTAC) 300 MG tablet Take  1 tablet (300 mg total) by mouth at bedtime. 30 tablet 2  . simvastatin (ZOCOR) 40 MG tablet TAKE ONE TABLET BY MOUTH ONCE DAILY WITH BREAKFAST - OFFICE VISIT NEEDED FOR FURTHER REFILLS 100 tablet 3  . torsemide (DEMADEX) 20 MG tablet TAKE ONE TABLET BY MOUTH TWICE DAILY 200 tablet 3  . triamcinolone (NASACORT) 55 MCG/ACT AERO nasal inhaler      No current facility-administered medications on file prior to visit.      Past Medical History:  Diagnosis Date  . Asthma   . CAD (coronary artery disease)  12/31/07   [facility] stents- placed in the circumflex and LAD  . Diabetes mellitus   . Hyperlipidemia   . Hypertension   . Nephrolithiasis   . OA (osteoarthritis)   . Obese    No Known Allergies  Social History   Social History  . Marital status: Married    Spouse name: N/A  . Number of children: N/A  . Years of education: N/A   Social History Main Topics  . Smoking status: Former Research scientist (life sciences)  . Smokeless tobacco: None  . Alcohol use No  . Drug use: No  . Sexual activity: Not Asked   Other Topics Concern  . None   Social History Narrative  . None    Vitals:   06/29/16 1444  BP: 102/60  Pulse: 63  Resp: 12  Temp: 98.2 F (36.8 C)   Body mass index is 46.89 kg/m.   Wt Readings from Last 3 Encounters:  06/29/16 299 lb 6 oz (135.8 kg)  04/27/16 298 lb (135.2 kg)  11/02/15 (!) 301 lb (136.5 kg)     Physical Exam  Nursing note and vitals reviewed. Constitutional: He is oriented to person, place, and time. He appears well-developed. No distress.  HENT:  Head: Atraumatic.  Mouth/Throat: Oropharynx is clear and moist and mucous membranes are normal.  Eyes: Conjunctivae and EOM are normal.  Cardiovascular: Normal rate and regular rhythm.   Pulses:      Dorsalis pedis pulses are 2+ on the right side, and 2+ on the left side.  Respiratory: Effort normal and breath sounds normal. No respiratory distress.  GI: Soft. Bowel sounds are normal. He exhibits no mass. There is no hepatomegaly. There is no tenderness.  Musculoskeletal: He exhibits edema (2+ pitting LE edema LE bilateral.). He exhibits no tenderness.       Cervical back: He exhibits no tenderness.  Adequate cervical ROM and no pain with palpation.  Neurological: He is alert and oriented to person, place, and time. He has normal strength. Coordination and gait normal.  Skin: Skin is warm. No erythema.  Psychiatric: He has a normal mood and affect.  Well groomed, good eye contact.      ASSESSMENT  AND PLAN:     [name] was seen today for follow-up.  Diagnoses and all orders for this visit:    LUQ abdominal pain  Resolved. Instructed about warning signs. Continue Zantac for now. Avoid foods that could potentially cause GI irritation. F/U as needed.  Radicular pain of left upper extremity  Resolved after Prednisone treatment. Caution with oral Prednisone, some side effects discussed.  I do not think he needs imaging at this time but if it happens again MRI needs to be considered. Instructed about warning signs.  Essential hypertension  On low normal range. No changes in current management. Monitor BP at home.  BMI 45.0-49.9, adult ([place])  Wt otherwise stable. Adverse effects of obesity discussed.  He has not change dietary habits since his last OV. Recommend decreasing portion sides, avoid sugar added food, and try to walk 15 min daily.       -Mr. [name] was advised to return sooner than planned today if new concerns arise.       Rohen Kimes G. [place], MD  [facility] [place] office.

## 2016-07-02 DIAGNOSIS — E1122 Type 2 diabetes mellitus with diabetic chronic kidney disease: Secondary | ICD-10-CM | POA: Diagnosis not present

## 2016-07-02 DIAGNOSIS — N368 Other specified disorders of urethra: Secondary | ICD-10-CM | POA: Diagnosis not present

## 2016-07-02 DIAGNOSIS — N184 Chronic kidney disease, stage 4 (severe): Secondary | ICD-10-CM | POA: Diagnosis not present

## 2016-07-02 DIAGNOSIS — I129 Hypertensive chronic kidney disease with stage 1 through stage 4 chronic kidney disease, or unspecified chronic kidney disease: Secondary | ICD-10-CM | POA: Diagnosis not present

## 2016-07-02 DIAGNOSIS — I2510 Atherosclerotic heart disease of native coronary artery without angina pectoris: Secondary | ICD-10-CM | POA: Diagnosis not present

## 2016-07-02 DIAGNOSIS — Z79899 Other long term (current) drug therapy: Secondary | ICD-10-CM | POA: Diagnosis not present

## 2016-07-02 DIAGNOSIS — Z794 Long term (current) use of insulin: Secondary | ICD-10-CM | POA: Diagnosis not present

## 2016-07-02 DIAGNOSIS — Z7982 Long term (current) use of aspirin: Secondary | ICD-10-CM | POA: Diagnosis not present

## 2016-08-07 DIAGNOSIS — N184 Chronic kidney disease, stage 4 (severe): Secondary | ICD-10-CM | POA: Diagnosis not present

## 2016-08-07 DIAGNOSIS — N250 Renal osteodystrophy: Secondary | ICD-10-CM | POA: Diagnosis not present

## 2016-08-07 DIAGNOSIS — E559 Vitamin D deficiency, unspecified: Secondary | ICD-10-CM | POA: Diagnosis not present

## 2016-08-07 DIAGNOSIS — I1310 Hypertensive heart and chronic kidney disease without heart failure, with stage 1 through stage 4 chronic kidney disease, or unspecified chronic kidney disease: Secondary | ICD-10-CM | POA: Diagnosis not present

## 2016-08-11 ENCOUNTER — Other Ambulatory Visit: Payer: Self-pay | Admitting: Family Medicine

## 2016-08-11 DIAGNOSIS — R1012 Left upper quadrant pain: Secondary | ICD-10-CM

## 2016-08-11 DIAGNOSIS — G4700 Insomnia, unspecified: Secondary | ICD-10-CM | POA: Diagnosis not present

## 2016-08-11 DIAGNOSIS — G8929 Other chronic pain: Secondary | ICD-10-CM | POA: Diagnosis not present

## 2016-08-11 DIAGNOSIS — M47896 Other spondylosis, lumbar region: Secondary | ICD-10-CM | POA: Diagnosis not present

## 2016-08-31 ENCOUNTER — Other Ambulatory Visit: Payer: Self-pay | Admitting: Family Medicine

## 2016-09-05 ENCOUNTER — Other Ambulatory Visit: Payer: Self-pay

## 2016-09-05 MED ORDER — GLUCOSE BLOOD VI STRP: ORAL_STRIP | 3 refills | Status: DC

## 2016-10-29 NOTE — Progress Notes (Signed)
HPI:   Mr.Kosei H Salay is a 66 y.o. male, who is here today to follow on some chronic medical problems.  I last saw him on 06/29/16.  Hypertension:  Hx of CAD.  Currently on Labetalol  600 mg am and night and 300 mg at noon.Also on Hydralazine 25 mg tid.  He is taking medications as instructed, no side effects reported.  He has not noted unusual headache, visual changes, exertional chest pain, focal weakness, or edema.   Lab Results  Component Value Date   CREATININE 2.85 (H) 11/02/2015   BUN 46 (H) 11/02/2015   NA 142 11/02/2015   K 4.4 11/02/2015   CL 101 11/02/2015   CO2 29 11/02/2015    Diabetes Mellitus II:   He has not re-scheduled appt with Dr Gherghe,endocrinologists. Currently on Glipizide 10 mg bid,Novolog sliding scale (35 U daily in average) , and Lantus 45 U bid.   Checking BS's : "pretty good" Hypoglycemia: Denies.  He is tolerating medications well. He denies abdominal pain,vomiting, polydipsia, polyuria, or polyphagia. + Nausea. + Hx of peripheral neuropathy, bilateral foot numbness and burning,stable overall.   + CKD IV, follows with nephrologists, Dr Marlowe Sax, next appt in 11/2016.    Lab Results  Component Value Date   HGBA1C 6.0 04/27/2016   Lab Results  Component Value Date   MICROALBUR 14.3 (H) 11/02/2015    Hyperlipidemia:  Currently on Crestor 20 mg daily. Following a low fat diet: Not consistently .  He has not noted side effects with medication.  Lab Results  Component Value Date   CHOL 97 08/20/2014   HDL 23 (L) 08/20/2014   LDLCALC 23 08/20/2014   LDLDIRECT 80.7 05/05/2010   TRIG 255 (H) 08/20/2014   CHOLHDL 4.2 08/20/2014      Concerns today:    Respiratory symptoms:  2 days of productive cough with greenish sputum,denies hemoptysis.  Subjective fever. He has not noted chills or body aches. + Fatigue and dizziness.  Some nasal congestion, rhinorrhea, and post nasal drainage. He tells me that  a week ago he also had fever and respiratory symptoms but he was feeling better and most symptoms resolved.   +Intermittent wheezing and "some" SOB with exertion. He thinks he needs Prednisone, which has helped before.  + Nausea and diarrhea. He has had one loose stool today and has 2 yesterday. He denies abdominal pain or vomiting.  No Hx of recent travel. + Sick contact: Daughter and granddaughter. No known insect bite. + Hx of allergies: Asthma. + Former smoker. OTC medications for this problem: None. Eating ice cream, it is the only food he can tolerate because nausea. Also drinking water. Symptoms seem to be getting worse.   He did not drive today, his daughter did.   Review of Systems  Constitutional: Positive for appetite change, fatigue and fever.  HENT: Positive for congestion, postnasal drip and rhinorrhea. Negative for nosebleeds, sore throat and trouble swallowing.   Eyes: Negative for redness and visual disturbance.  Respiratory: Positive for cough, shortness of breath and wheezing.   Cardiovascular: Positive for leg swelling (at baseline). Negative for chest pain and palpitations.  Gastrointestinal: Positive for diarrhea and nausea. Negative for abdominal pain and vomiting.  Endocrine: Negative for polydipsia, polyphagia and polyuria.  Genitourinary: Negative for decreased urine volume, dysuria and hematuria.  Musculoskeletal: Positive for back pain and myalgias.  Skin: Negative for rash.  Allergic/Immunologic: Positive for environmental allergies.  Neurological: Positive for dizziness. Negative for  syncope, weakness and headaches.  Hematological: Negative for adenopathy. Does not bruise/bleed easily.  Psychiatric/Behavioral: Negative for confusion. The patient is not nervous/anxious.       Current Outpatient Prescriptions on File Prior to Visit  Medication Sig Dispense Refill  . allopurinol (ZYLOPRIM) 300 MG tablet TAKE ONE-HALF TABLET BY MOUTH IN THE MORNING  (Patient taking differently: Take 150 mg by mouth daily. ) 100 tablet 3  . colchicine 0.6 MG tablet Take 1 tablet (0.6 mg total) by mouth daily. (Patient taking differently: Take 0.6 mg by mouth daily as needed (for gout flares). ) 90 tablet 3  . glipiZIDE (GLUCOTROL) 10 MG tablet TAKE ONE TABLET BY MOUTH TWICE DAILY BEFORE  A  MEAL (Patient taking differently: Take 10 mg by mouth 2 (two) times daily before a meal. ) 200 tablet 3  . hydrALAZINE (APRESOLINE) 25 MG tablet Take 25 mg by mouth 3 (three) times daily.    . insulin aspart (NOVOLOG FLEXPEN) 100 UNIT/ML FlexPen INJECT 90 UNITS SUBCUTANEOUSLY ONCE DAILY (Patient taking differently: Inject 21-25 Units into the skin 3 (three) times daily with meals. Pt uses per sliding scale.) 90 mL 5  . Insulin Glargine (LANTUS SOLOSTAR) 100 UNIT/ML Solostar Pen INJECT 90 UNITS SUBQ AT BEDTIME (Patient taking differently: Inject 45 Units into the skin 2 (two) times daily. ) 90 mL 11  . labetalol (NORMODYNE) 300 MG tablet TAKE TWO TABLETS BY MOUTH IN THE MORNING AND ONE AT NOON AND TWO AT BEDTIME - OFFICE VISIT NEEDED FOR FURTHER REFILLS (Patient taking differently: Take 600 mg by mouth 3 (three) times daily. ) 500 tablet 3  . montelukast (SINGULAIR) 10 MG tablet Take 10 mg by mouth at bedtime.     . prasugrel (EFFIENT) 10 MG TABS Take 10 mg by mouth daily.      . ranitidine (ZANTAC) 300 MG tablet TAKE ONE TABLET BY MOUTH AT BEDTIME 90 tablet 1  . simvastatin (ZOCOR) 40 MG tablet TAKE ONE TABLET BY MOUTH ONCE DAILY WITH BREAKFAST - OFFICE VISIT NEEDED FOR FURTHER REFILLS (Patient taking differently: Take 40 mg by mouth at bedtime. ) 100 tablet 3  . torsemide (DEMADEX) 20 MG tablet TAKE ONE TABLET BY MOUTH TWICE DAILY 200 tablet 3   No current facility-administered medications on file prior to visit.      Past Medical History:  Diagnosis Date  . Asthma   . CAD (coronary artery disease) 12/31/07   [facility] stents- placed in the circumflex and LAD  .  Diabetes mellitus   . Hyperlipidemia   . Hypertension   . Nephrolithiasis   . OA (osteoarthritis)   . Obese    No Known Allergies  Social History   Social History  . Marital status: Married    Spouse name: N/A  . Number of children: N/A  . Years of education: N/A   Social History Main Topics  . Smoking status: Former Research scientist (life sciences)  . Smokeless tobacco: Never Used  . Alcohol use No  . Drug use: No  . Sexual activity: Not Asked   Other Topics Concern  . None   Social History Narrative  . None    Vitals:   10/30/16 0927  BP: 140/80  Pulse: 74  Resp: 16  Temp: 100 F (37.8 C)  O2 sat at RA 92%. Body mass index is 46.22 kg/m.   Physical Exam  Nursing note and vitals reviewed. Constitutional: He is oriented to person, place, and time. He appears well-developed. No distress.  HENT:  Head: Atraumatic.  Nose: Rhinorrhea present.  Mouth/Throat: Oropharynx is clear and moist and mucous membranes are normal.  Eyes: Conjunctivae and EOM are normal.  Cardiovascular: Normal rate and regular rhythm.   Heart sounds distant. PT pulses present bilateral.  Respiratory: Effort normal. No respiratory distress. He has wheezes (mild). He has rales (Fine rales right base).  GI: Soft. He exhibits no mass. Bowel sounds are increased. There is no tenderness.  Musculoskeletal: He exhibits edema (1+ pitting LE edema LE bilateral.). He exhibits no tenderness.  Lymphadenopathy:    He has no cervical adenopathy.  Neurological: He is alert and oriented to person, place, and time. Coordination normal.  No focal deficit. Slow,stable gait, not assisted.  Skin: Skin is warm. No erythema.  Psychiatric: He has a normal mood and affect.  Well groomed, good eye contact.    04/27/16 Foot exam.  ASSESSMENT AND PLAN:   Seanpaul was seen today for follow-up.  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 No changes  in current management. Regular exercise as tolerated and healthy diet with avoidance of added sugar food intake is an important part of treatment and recommended. Caution with hypoglycemia. Annual eye exam, periodic dental and foot care recommended. F/U in 4 months  -     Hemoglobin A1c   Essential hypertension  SBP slightly elevated. No changes in current management today. DASH-low salt diet recommended. Monitor BP at home.  F/U in 4 months, before if needed.  Hyperlipidemia, unspecified hyperlipidemia type  No changes in current management, will follow labs done today and will give further recommendations accordingly. Low fat diet discussed and recommended.  -     Lipid panel  Pneumonia, unspecified organism  Rapid flu negative. Per clinical findings I will treat as pneumonia (rales right base). CXR and CBC were ordered. He was instructed about warning signs given his Hx of CAD,CKD,and DM II.   Explained that abx can aggravate GI symptoms. F/U in 1-2 weeks.  Apparently he was not able to get in the car due to weakness (daughter driving), so he was brought back. He feels like he is getting worse and would like to go to the ER. Sent to ER [facility] via EMS. CBC and CXR cancelled.   -     doxycycline (VIBRA-TABS) 100 MG tablet; Take 1 tablet (100 mg total) by mouth 2 (two) times daily.  -     POC Influenza A&B (Binax test)  Nausea without vomiting  Small and frequent sips of clear fluids, Pedialyte and even Gatorade if better tolerated. Instructed about warning signs.  -     ondansetron (ZOFRAN) 4 MG tablet; Take 1 tablet (4 mg total) by mouth every 8 (eight) hours as needed for nausea or vomiting.  Intermittent asthma with acute exacerbation, unspecified asthma severity  Mild wheezing noted on auscultation. Some side effects of Prednisone discussed, recommended taking it with breakfast/food. Albuterol inh 2 puff every 6 hours for a week then as needed for wheezing or  shortness of breath.  Instructed about warning signs. F/U in 1-2 weeks,before if needed.   -     albuterol (PROVENTIL HFA;VENTOLIN HFA) 108 (90 Base) MCG/ACT inhaler; Inhale 2 puffs into the lungs every 6 (six) hours as needed for wheezing or shortness of breath. -     predniSONE (DELTASONE) 20 MG tablet; Take 2 tablets (40 mg total) by mouth daily with breakfast.    -Mr. Princess Bruins  Bloodgood was advised to return sooner than planned today if new concerns arise.       Betty G. Martinique, MD  [facility]. [place] office.

## 2016-10-30 ENCOUNTER — Emergency Department (HOSPITAL_COMMUNITY)
Admission: EM | Admit: 2016-10-30 | Discharge: 2016-10-30 | Discharge status: Against Medical Advice | Payer: Medicare Other | Source: Home / Self Care | Attending: Emergency Medicine | Admitting: Emergency Medicine

## 2016-10-30 ENCOUNTER — Emergency Department (HOSPITAL_COMMUNITY): Payer: Medicare Other

## 2016-10-30 ENCOUNTER — Encounter (HOSPITAL_COMMUNITY): Payer: Self-pay | Admitting: Emergency Medicine

## 2016-10-30 ENCOUNTER — Ambulatory Visit (INDEPENDENT_AMBULATORY_CARE_PROVIDER_SITE_OTHER): Payer: Medicare Other | Admitting: Family Medicine

## 2016-10-30 ENCOUNTER — Encounter: Payer: Self-pay | Admitting: Family Medicine

## 2016-10-30 VITALS — BP 140/80 | HR 74 | Temp 100.0°F | Resp 16 | Ht 67.0 in | Wt 295.1 lb

## 2016-10-30 DIAGNOSIS — R7989 Other specified abnormal findings of blood chemistry: Principal | ICD-10-CM

## 2016-10-30 DIAGNOSIS — I509 Heart failure, unspecified: Secondary | ICD-10-CM | POA: Diagnosis not present

## 2016-10-30 DIAGNOSIS — E1165 Type 2 diabetes mellitus with hyperglycemia: Secondary | ICD-10-CM | POA: Diagnosis not present

## 2016-10-30 DIAGNOSIS — J45909 Unspecified asthma, uncomplicated: Secondary | ICD-10-CM | POA: Insufficient documentation

## 2016-10-30 DIAGNOSIS — E785 Hyperlipidemia, unspecified: Secondary | ICD-10-CM

## 2016-10-30 DIAGNOSIS — R110 Nausea: Secondary | ICD-10-CM

## 2016-10-30 DIAGNOSIS — E1151 Type 2 diabetes mellitus with diabetic peripheral angiopathy without gangrene: Secondary | ICD-10-CM

## 2016-10-30 DIAGNOSIS — J810 Acute pulmonary edema: Secondary | ICD-10-CM | POA: Insufficient documentation

## 2016-10-30 DIAGNOSIS — I129 Hypertensive chronic kidney disease with stage 1 through stage 4 chronic kidney disease, or unspecified chronic kidney disease: Secondary | ICD-10-CM | POA: Insufficient documentation

## 2016-10-30 DIAGNOSIS — R509 Fever, unspecified: Secondary | ICD-10-CM | POA: Diagnosis not present

## 2016-10-30 DIAGNOSIS — J4521 Mild intermittent asthma with (acute) exacerbation: Secondary | ICD-10-CM | POA: Diagnosis not present

## 2016-10-30 DIAGNOSIS — N184 Chronic kidney disease, stage 4 (severe): Secondary | ICD-10-CM

## 2016-10-30 DIAGNOSIS — IMO0002 Reserved for concepts with insufficient information to code with codable children: Secondary | ICD-10-CM

## 2016-10-30 DIAGNOSIS — R531 Weakness: Secondary | ICD-10-CM | POA: Diagnosis not present

## 2016-10-30 DIAGNOSIS — I214 Non-ST elevation (NSTEMI) myocardial infarction: Secondary | ICD-10-CM

## 2016-10-30 DIAGNOSIS — E1140 Type 2 diabetes mellitus with diabetic neuropathy, unspecified: Secondary | ICD-10-CM

## 2016-10-30 DIAGNOSIS — I2510 Atherosclerotic heart disease of native coronary artery without angina pectoris: Secondary | ICD-10-CM

## 2016-10-30 DIAGNOSIS — J189 Pneumonia, unspecified organism: Secondary | ICD-10-CM

## 2016-10-30 DIAGNOSIS — Z7982 Long term (current) use of aspirin: Secondary | ICD-10-CM

## 2016-10-30 DIAGNOSIS — D649 Anemia, unspecified: Secondary | ICD-10-CM | POA: Diagnosis present

## 2016-10-30 DIAGNOSIS — R778 Other specified abnormalities of plasma proteins: Secondary | ICD-10-CM

## 2016-10-30 DIAGNOSIS — Z9289 Personal history of other medical treatment: Secondary | ICD-10-CM

## 2016-10-30 DIAGNOSIS — Z794 Long term (current) use of insulin: Secondary | ICD-10-CM | POA: Insufficient documentation

## 2016-10-30 DIAGNOSIS — N289 Disorder of kidney and ureter, unspecified: Secondary | ICD-10-CM | POA: Diagnosis not present

## 2016-10-30 DIAGNOSIS — R0602 Shortness of breath: Secondary | ICD-10-CM | POA: Insufficient documentation

## 2016-10-30 DIAGNOSIS — I130 Hypertensive heart and chronic kidney disease with heart failure and stage 1 through stage 4 chronic kidney disease, or unspecified chronic kidney disease: Secondary | ICD-10-CM | POA: Diagnosis not present

## 2016-10-30 DIAGNOSIS — R05 Cough: Secondary | ICD-10-CM | POA: Diagnosis not present

## 2016-10-30 DIAGNOSIS — E1122 Type 2 diabetes mellitus with diabetic chronic kidney disease: Secondary | ICD-10-CM | POA: Diagnosis not present

## 2016-10-30 DIAGNOSIS — N179 Acute kidney failure, unspecified: Secondary | ICD-10-CM

## 2016-10-30 DIAGNOSIS — Z87891 Personal history of nicotine dependence: Secondary | ICD-10-CM | POA: Insufficient documentation

## 2016-10-30 DIAGNOSIS — Z6841 Body Mass Index (BMI) 40.0 and over, adult: Secondary | ICD-10-CM | POA: Diagnosis not present

## 2016-10-30 DIAGNOSIS — I21A1 Myocardial infarction type 2: Secondary | ICD-10-CM | POA: Diagnosis not present

## 2016-10-30 DIAGNOSIS — I10 Essential (primary) hypertension: Secondary | ICD-10-CM

## 2016-10-30 HISTORY — DX: Non-ST elevation (NSTEMI) myocardial infarction: I21.4

## 2016-10-30 HISTORY — DX: Personal history of other medical treatment: Z92.89

## 2016-10-30 LAB — POC INFLUENZA A&B (BINAX/QUICKVUE)
INFLUENZA A, POC: NEGATIVE
Influenza B, POC: NEGATIVE

## 2016-10-30 LAB — CBC WITH DIFFERENTIAL/PLATELET
Basophils Absolute: 0 10*3/uL (ref 0.0–0.1)
Basophils Relative: 0 %
EOS ABS: 0 10*3/uL (ref 0.0–0.7)
Eosinophils Relative: 0 %
HCT: 19.8 % — ABNORMAL LOW (ref 39.0–52.0)
Hemoglobin: 6.6 g/dL — CL (ref 13.0–17.0)
LYMPHS ABS: 2.4 10*3/uL (ref 0.7–4.0)
Lymphocytes Relative: 19 %
MCH: 30.6 pg (ref 26.0–34.0)
MCHC: 33.3 g/dL (ref 30.0–36.0)
MCV: 91.7 fL (ref 78.0–100.0)
Monocytes Absolute: 1.2 10*3/uL — ABNORMAL HIGH (ref 0.1–1.0)
Monocytes Relative: 10 %
Neutro Abs: 8.9 10*3/uL — ABNORMAL HIGH (ref 1.7–7.7)
Neutrophils Relative %: 71 %
PLATELETS: 165 10*3/uL (ref 150–400)
RBC: 2.16 MIL/uL — AB (ref 4.22–5.81)
RDW: 14 % (ref 11.5–15.5)
WBC: 12.5 10*3/uL — AB (ref 4.0–10.5)

## 2016-10-30 LAB — COMPREHENSIVE METABOLIC PANEL
ALT: 19 U/L (ref 17–63)
AST: 43 U/L — AB (ref 15–41)
Albumin: 3.3 g/dL — ABNORMAL LOW (ref 3.5–5.0)
Alkaline Phosphatase: 44 U/L (ref 38–126)
Anion gap: 12 (ref 5–15)
BILIRUBIN TOTAL: 0.9 mg/dL (ref 0.3–1.2)
BUN: 61 mg/dL — AB (ref 6–20)
CALCIUM: 8 mg/dL — AB (ref 8.9–10.3)
CHLORIDE: 95 mmol/L — AB (ref 101–111)
CO2: 23 mmol/L (ref 22–32)
CREATININE: 4.44 mg/dL — AB (ref 0.61–1.24)
GFR calc Af Amer: 15 mL/min — ABNORMAL LOW (ref >60)
GFR, EST NON AFRICAN AMERICAN: 13 mL/min — AB (ref >60)
Glucose, Bld: 97 mg/dL (ref 65–99)
Potassium: 4.1 mmol/L (ref 3.5–5.1)
Sodium: 130 mmol/L — ABNORMAL LOW (ref 135–145)
TOTAL PROTEIN: 6.7 g/dL (ref 6.5–8.1)

## 2016-10-30 LAB — RETICULOCYTES
RBC.: 2.57 MIL/uL — ABNORMAL LOW (ref 4.22–5.81)
RETIC CT PCT: 1.2 % (ref 0.4–3.1)
Retic Count, Absolute: 30.8 10*3/uL (ref 19.0–186.0)

## 2016-10-30 LAB — ABO/RH: ABO/RH(D): B POS

## 2016-10-30 LAB — POC OCCULT BLOOD, ED: FECAL OCCULT BLD: NEGATIVE

## 2016-10-30 LAB — HEMOGLOBIN A1C: Hgb A1c MFr Bld: 7 % — ABNORMAL HIGH (ref 4.6–6.5)

## 2016-10-30 LAB — LIPID PANEL
CHOL/HDL RATIO: 4
Cholesterol: 104 mg/dL (ref 0–200)
HDL: 24.9 mg/dL — AB (ref >39.00)
LDL CALC: 45 mg/dL (ref 0–99)
NonHDL: 79.15
TRIGLYCERIDES: 170 mg/dL — AB (ref 0.0–149.0)
VLDL: 34 mg/dL (ref 0.0–40.0)

## 2016-10-30 LAB — I-STAT CG4 LACTIC ACID, ED: Lactic Acid, Venous: 1.09 mmol/L (ref 0.5–1.9)

## 2016-10-30 LAB — BRAIN NATRIURETIC PEPTIDE: B NATRIURETIC PEPTIDE 5: 384.3 pg/mL — AB (ref 0.0–100.0)

## 2016-10-30 LAB — PREPARE RBC (CROSSMATCH)

## 2016-10-30 LAB — TROPONIN I: Troponin I: 6.59 ng/mL (ref <0.03)

## 2016-10-30 LAB — APTT: APTT: 34 s (ref 24–36)

## 2016-10-30 MED ORDER — SODIUM CHLORIDE 0.9 % IV SOLN
10.0000 mL/h | Freq: Once | INTRAVENOUS | Status: AC
  Administered 2016-10-30: 10 mL/h via INTRAVENOUS

## 2016-10-30 MED ORDER — FUROSEMIDE 10 MG/ML IJ SOLN
40.0000 mg | Freq: Once | INTRAMUSCULAR | Status: AC
Start: 2016-10-30 — End: 2016-10-30
  Administered 2016-10-30: 40 mg via INTRAVENOUS
  Filled 2016-10-30: qty 4

## 2016-10-30 MED ORDER — DOXYCYCLINE HYCLATE 100 MG PO TABS: 100.0000 mg | ORAL_TABLET | Freq: Two times a day (BID) | ORAL | 0 refills | Status: DC

## 2016-10-30 MED ORDER — ONDANSETRON HCL 4 MG PO TABS: 4.0000 mg | ORAL_TABLET | Freq: Three times a day (TID) | ORAL | 0 refills | Status: AC | PRN

## 2016-10-30 MED ORDER — PREDNISONE 20 MG PO TABS: 40.0000 mg | ORAL_TABLET | Freq: Every day | ORAL | 0 refills | Status: DC

## 2016-10-30 MED ORDER — ALBUTEROL SULFATE HFA 108 (90 BASE) MCG/ACT IN AERS: 2.0000 | INHALATION_SPRAY | Freq: Four times a day (QID) | RESPIRATORY_TRACT | 0 refills | Status: DC | PRN

## 2016-10-30 NOTE — ED Notes (Signed)
Pt transported to DG.  

## 2016-10-30 NOTE — ED Notes (Addendum)
Pt ambulated to restroom with steady gait; pt verbalizes brown stool; pt denies black or red in stool. Pt unable to provide urine sample at this time.

## 2016-10-30 NOTE — ED Triage Notes (Signed)
Per EMS pt sent from PCP for evaluation of weakness and exertional SOB onset today. Pt rhonchi in lower lung fields.

## 2016-10-30 NOTE — Discharge Instructions (Signed)
You have signs of kidney failure, heart failure, and heart strain/heart attack.   These are emergent medical conditions that we strongly advise admission for.  Since you have opted for discharge, please see your cardiologist/nephrologist, or go to ED in [place] for ASAP.  Return for worsening symptoms, including passing out, difficulty breathing, confusion, chest pain or any other symptoms concerning to you.

## 2016-10-30 NOTE — ED Notes (Signed)
Pt states used 2 lpm Kent Osborne at home with laying down.

## 2016-10-30 NOTE — ED Notes (Signed)
Bed: WA04 Expected date:  Expected time:  Means of arrival:  Comments: 66 yo gen weakness

## 2016-10-30 NOTE — Patient Instructions (Addendum)
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  This can be done at [facility] at [facility] between 8 am and 5 pm: [place]. [phone].   Please be sure medication list is accurate. If a new problem present, please set up appointment sooner than planned today.

## 2016-10-30 NOTE — ED Notes (Signed)
Pt ambulatory with steady gait to nearby restroom; same see noted 1238.

## 2016-10-30 NOTE — ED Provider Notes (Addendum)
[facility] DEPT Provider Note   CSN: [id number] Arrival date & time: 10/30/16  1139     History   Chief Complaint Chief Complaint  Patient presents with  . Weakness  . Shortness of Breath    HPI Kent Osborne is a 66 y.o. male.  HPI  66 year old male who presents with shortness of breath and weakness. He has a history of coronary artery disease, diabetes, hypertension, hyperlipidemia and asthma. States that for several months now he has been having dyspnea on exertion with mild intermittent lower extremity edema. Sleeps with CPAP and has not had orthopnea or PND. Denies any chest pain. States that over the past 3-4 days his had generalized weakness, cough with clear sputum, and low-grade fever. Seen by PCP today, and sent to ED given complaints of shortness of breath/dyspnea on exertion. He has not had melena or hematochezia, abdominal pain or distention, vomiting or diarrhea.  Past Medical History:  Diagnosis Date  . Asthma   . CAD (coronary artery disease) 12/31/07   [facility] stents- placed in the circumflex and LAD  . Diabetes mellitus   . Hyperlipidemia   . Hypertension   . Nephrolithiasis   . OA (osteoarthritis)   . Obese     Patient Active Problem List   Diagnosis Date Noted  . Symptomatic anemia 10/30/2016  . BMI 45.0-49.9, adult ([place]) 04/29/2016  . Gout, arthropathy 04/27/2016  . Diabetic peripheral neuropathy associated with type 2 diabetes mellitus ([place]) 08/20/2014  . Type II diabetes mellitus with circulatory disorder, uncontrolled 07/09/2014  . Renal insufficiency 02/29/2012  . HEMATURIA 07/27/2010  . GOUT, UNSPECIFIED 07/05/2010  . BACK PAIN, LUMBAR 04/18/2010  . Backache 01/06/2010  . GENERALIZED OSTEOARTHROSIS INVOLVING HAND 07/20/2009  . Chronic kidney disease (CKD), stage IV (severe) ([place]) 04/12/2009  . PERIPHERAL EDEMA 12/31/2008  . ASTHMA 10/08/2008  . Osteoarthritis 07/28/2008  . Hyperlipemia 01/06/2008  . CORONARY ARTERY  DISEASE 01/06/2008  . CORONARY ARTERY DISEASE, S/P PTCA 12/31/2007  . Essential hypertension 02/21/2007  . CHOLELITHIASIS 02/21/2007  . NEPHROLITHIASIS, HX OF 02/21/2007    Past Surgical History:  Procedure Laterality Date  . heart stents    . KIDNEY STONE SURGERY         Home Medications    Prior to Admission medications   Medication Sig Start Date End Date Taking? Authorizing Provider  albuterol (PROVENTIL HFA;VENTOLIN HFA) 108 (90 Base) MCG/ACT inhaler Inhale 2 puffs into the lungs every 6 (six) hours as needed for wheezing or shortness of breath. 10/30/16  Yes Betty G Martinique, MD  allopurinol (ZYLOPRIM) 300 MG tablet TAKE ONE-HALF TABLET BY MOUTH IN THE MORNING Patient taking differently: Take 150 mg by mouth daily.  11/02/15  Yes Dorena Cookey, MD  aspirin EC 81 MG tablet Take 81 mg by mouth daily.   Yes Historical Provider, MD  colchicine 0.6 MG tablet Take 1 tablet (0.6 mg total) by mouth daily. Patient taking differently: Take 0.6 mg by mouth daily as needed (for gout flares).  08/13/14  Yes Dorena Cookey, MD  glipiZIDE (GLUCOTROL) 10 MG tablet TAKE ONE TABLET BY MOUTH TWICE DAILY BEFORE  A  MEAL Patient taking differently: Take 10 mg by mouth 2 (two) times daily before a meal.  11/02/15  Yes Dorena Cookey, MD  hydrALAZINE (APRESOLINE) 25 MG tablet Take 25 mg by mouth 3 (three) times daily.   Yes Historical Provider, MD  insulin aspart (NOVOLOG FLEXPEN) 100 UNIT/ML FlexPen INJECT 90 UNITS SUBCUTANEOUSLY ONCE DAILY Patient  taking differently: Inject 21-25 Units into the skin 3 (three) times daily with meals. Pt uses per sliding scale. 11/02/15  Yes Dorena Cookey, MD  Insulin Glargine (LANTUS SOLOSTAR) 100 UNIT/ML Solostar Pen INJECT 90 UNITS SUBQ AT BEDTIME Patient taking differently: Inject 45 Units into the skin 2 (two) times daily.  11/17/15  Yes Dorena Cookey, MD  labetalol (NORMODYNE) 300 MG tablet TAKE TWO TABLETS BY MOUTH IN THE MORNING AND ONE AT NOON AND TWO AT BEDTIME  - OFFICE VISIT NEEDED FOR FURTHER REFILLS Patient taking differently: Take 600 mg by mouth 3 (three) times daily.  12/29/15  Yes Dorena Cookey, MD  montelukast (SINGULAIR) 10 MG tablet Take 10 mg by mouth at bedtime.    Yes Historical Provider, MD  prasugrel (EFFIENT) 10 MG TABS Take 10 mg by mouth daily.     Yes Historical Provider, MD  ranitidine (ZANTAC) 300 MG tablet TAKE ONE TABLET BY MOUTH AT BEDTIME 08/11/16  Yes Betty G Martinique, MD  simvastatin (ZOCOR) 40 MG tablet TAKE ONE TABLET BY MOUTH ONCE DAILY WITH BREAKFAST - OFFICE VISIT NEEDED FOR FURTHER REFILLS Patient taking differently: Take 40 mg by mouth at bedtime.  11/02/15  Yes Dorena Cookey, MD  torsemide (DEMADEX) 20 MG tablet TAKE ONE TABLET BY MOUTH TWICE DAILY 08/10/14  Yes Dorena Cookey, MD  doxycycline (VIBRA-TABS) 100 MG tablet Take 1 tablet (100 mg total) by mouth 2 (two) times daily. 10/30/16 11/09/16  Betty G Martinique, MD  ondansetron (ZOFRAN) 4 MG tablet Take 1 tablet (4 mg total) by mouth every 8 (eight) hours as needed for nausea or vomiting. 10/30/16 11/04/16  Betty G Martinique, MD  predniSONE (DELTASONE) 20 MG tablet Take 2 tablets (40 mg total) by mouth daily with breakfast. 10/30/16 11/02/16  Betty G Martinique, MD    Family History Family History  Problem Relation Age of Onset  . Diabetes Other   . Hyperlipidemia Other   . Hypertension Other     Social History Social History  Substance Use Topics  . Smoking status: Former Research scientist (life sciences)  . Smokeless tobacco: Never Used  . Alcohol use No     Allergies   Patient has no known allergies.   Review of Systems Review of Systems  Constitutional: Positive for fatigue. Negative for fever.  HENT: Positive for congestion.   Respiratory: Positive for shortness of breath.   Cardiovascular: Positive for leg swelling.  Gastrointestinal: Negative for abdominal pain, blood in stool, diarrhea, nausea and vomiting.  Genitourinary: Negative for difficulty urinating.  Skin: Positive for  pallor.  Allergic/Immunologic: Negative for immunocompromised state.  Neurological: Negative for syncope.  Hematological: Does not bruise/bleed easily.  Psychiatric/Behavioral: Negative for confusion.  All other systems reviewed and are negative.    Physical Exam Updated Vital Signs BP 130/61   Pulse 61   Temp 99 F (37.2 C) (Oral)   Resp 24   Ht 5\' 7"  (1.702 m)   Wt 295 lb (133.8 kg)   SpO2 95%   BMI 46.20 kg/m   Physical Exam Physical Exam  Nursing note and vitals reviewed. Constitutional: Well developed, well nourished, non-toxic, and in no acute distress Head: Normocephalic and atraumatic.  Mouth/Throat: Oropharynx is clear and moist.  Neck: Normal range of motion. Neck supple.  Cardiovascular: Normal rate and regular rhythm.   trace pedal edema Pulmonary/Chest: Effort normal and breath sounds with scattered wheeze. Diminished throughout  Abdominal: Soft. There is no tenderness. There is no rebound and no guarding. Light  brown stool on rectal exam. No melena or hematochezia  Musculoskeletal: Normal range of motion.  Neurological: Alert, no facial droop, fluent speech, moves all extremities symmetrically Skin: Skin is warm and dry.  Psychiatric: Cooperative   ED Treatments / Results  Labs (all labs ordered are listed, but only abnormal results are displayed) Labs Reviewed  COMPREHENSIVE METABOLIC PANEL - Abnormal; Notable for the following:       Result Value   Sodium 130 (*)    Chloride 95 (*)    BUN 61 (*)    Creatinine, Ser 4.44 (*)    Calcium 8.0 (*)    Albumin 3.3 (*)    AST 43 (*)    GFR calc non Af Amer 13 (*)    GFR calc Af Amer 15 (*)    All other components within normal limits  CBC WITH DIFFERENTIAL/PLATELET - Abnormal; Notable for the following:    WBC 12.5 (*)    RBC 2.16 (*)    Hemoglobin 6.6 (*)    HCT 19.8 (*)    Neutro Abs 8.9 (*)    Monocytes Absolute 1.2 (*)    All other components within normal limits  BRAIN NATRIURETIC PEPTIDE -  Abnormal; Notable for the following:    B Natriuretic Peptide 384.3 (*)    All other components within normal limits  TROPONIN I - Abnormal; Notable for the following:    Troponin I 6.59 (*)    All other components within normal limits  APTT  URINALYSIS, ROUTINE W REFLEX MICROSCOPIC  VITAMIN B12  FOLATE  IRON AND TIBC  FERRITIN  RETICULOCYTES  I-STAT CG4 LACTIC ACID, ED  POC OCCULT BLOOD, ED  TYPE AND SCREEN  PREPARE RBC (CROSSMATCH)  ABO/RH    EKG  EKG Interpretation  Date/Time:  Monday October 30 2016 11:56:27 EDT Ventricular Rate:  67 PR Interval:    QRS Duration: 121 QT Interval:  466 QTC Calculation: 492 R Axis:   -79 Text Interpretation:  Junctional rhythm Nonspecific IVCD with LAD Baseline wander in lead(s) V1 V3 sinus rhythm no prior EKG Confirmed by Isaiah Torok MD, Kasch Borquez [phone]) on 10/30/2016 3:04:46 PM       Radiology Dg Chest 2 View  Result Date: 10/30/2016 CLINICAL DATA:  Dyspnea EXAM: CHEST  2 VIEW COMPARISON:  05/28/2016 chest radiograph. FINDINGS: Stable cardiomediastinal silhouette with mild cardiomegaly and aortic atherosclerosis. No pneumothorax. No pleural effusion. Mild pulmonary edema. IMPRESSION: Mild congestive heart failure. Aortic atherosclerosis. Electronically Signed   By: Ilona Sorrel M.D.   On: 10/30/2016 12:23    Procedures Procedures (including critical care time) CRITICAL CARE Performed by: Forde Dandy   Total critical care time: 35 minutes  Critical care time was exclusive of separately billable procedures and treating other patients.  Critical care was necessary to treat or prevent imminent or life-threatening deterioration.  Critical care was time spent personally by me on the following activities: development of treatment plan with patient and/or surrogate as well as nursing, discussions with consultants, evaluation of patient's response to treatment, examination of patient, obtaining history from patient or surrogate, ordering and  performing treatments and interventions, ordering and review of laboratory studies, ordering and review of radiographic studies, pulse oximetry and re-evaluation of patient's condition.  Medications Ordered in ED Medications  furosemide (LASIX) injection 40 mg (40 mg Intravenous Given 10/30/16 1332)  0.9 %  sodium chloride infusion (10 mL/hr Intravenous New Bag/Given 10/30/16 1432)     Initial Impression / Assessment and Plan / ED Course  I have reviewed the triage vital signs and the nursing notes.  Pertinent labs & imaging results that were available during my care of the patient were reviewed by me and considered in my medical decision making (see chart for details).     66 year old male with history of CAD, diabetes hypertension and hyperlipidemia who presents with several weeks of generalized weakness and exertional shortness of breath, worse over the past several days. Is not hypoxic or in significant respiratory distress. Clinically not significantly fluid overloaded, but chest x-ray does show interstitial edema.  Blood work is concerning for worsening renal function with a creatinine of 4.4. He also has new onset anemia with hemoglobin of 6.6. No evidence of GI bleed by history and on exam. With mildly elevated BNP in the 300s, and elevated troponin of 6.5. No ischemic changes noted on EKG and no chest pain.  I discussed with cardiology, Dr. Johnsie Cancel on recommending admission at [facility] for further cardiology and nephrology consults. Hold on heparin drip due to acute anemia.   He is transfused 1 unit of PRBCs. I discussed with Dr. Algis Liming from hospitalist service who will arrange admission to [facility] by Dr. Marily Memos.   4:40PM Notified by hospitalist the patient requests discharge to follow up with his outpatient cardiologist and nephrologist through [place]. I discussed that with his acute renal failure, elevated troponin, and signs of fluid overload that he has acute emergent  medical problem that requires admission today. He expresses understanding of all these. He has the capacity to make his decisions. he expresses understanding that I'm leaving here today he would be at risk for worsening heart and renal failure, cardiac arrest, death and severe disability. He still requests discharge. Try to involve daughter, but patient requests discharge AMA. Paperwork signed. Strict return and follow-up instructions reviewed.    Final Clinical Impressions(s) / ED Diagnoses   Final diagnoses:  Elevated troponin  Acute kidney injury ([place])  Acute pulmonary edema ([place])    New Prescriptions New Prescriptions   No medications on file     Forde Dandy, MD 10/30/16 [place] Payson Evrard, MD 10/30/16 [phone]

## 2016-10-30 NOTE — ED Notes (Addendum)
Previously pt has agreed to transfer/admission to [facility]. Hospitalist assessment just completed; pt now verbalizing not want to be admitted. Per Kent Osborne pt leaving AMA and signage needed for such. Kent Osborne has spoken with pt/family risks of leaving AMA.   Pt has agreed to finish blood transfusion prior to discharge. Awaiting completion of blood transfusion.

## 2016-10-30 NOTE — Progress Notes (Signed)
Pre visit review using our clinic review tool, if applicable. No additional management support is needed unless otherwise documented below in the visit note. 

## 2016-10-30 NOTE — ED Notes (Signed)
Lab called with critical results: Hgb 6.6. RN notified and EDP notified

## 2016-10-30 NOTE — ED Notes (Addendum)
Liu aware of pt trending temperature and verbalizes continue transfusing blood at current rate; no evidence of blood transfusion reaction. Pt resting; denies pain.

## 2016-10-31 LAB — TYPE AND SCREEN
ABO/RH(D): B POS
ANTIBODY SCREEN: NEGATIVE
UNIT DIVISION: 0

## 2016-10-31 LAB — BPAM RBC
BLOOD PRODUCT EXPIRATION DATE: 201804102359
ISSUE DATE / TIME: 201803121406
UNIT TYPE AND RH: 7300

## 2016-11-01 ENCOUNTER — Emergency Department (HOSPITAL_COMMUNITY): Payer: Medicare Other

## 2016-11-01 ENCOUNTER — Inpatient Hospital Stay (HOSPITAL_COMMUNITY)
Admission: EM | Admit: 2016-11-01 | Discharge: 2016-11-03 | DRG: 281 | Disposition: A | Discharge status: Home / Self Care | Payer: Medicare Other | Attending: Internal Medicine | Admitting: Internal Medicine

## 2016-11-01 ENCOUNTER — Inpatient Hospital Stay (HOSPITAL_COMMUNITY): Payer: Medicare Other

## 2016-11-01 ENCOUNTER — Encounter (HOSPITAL_COMMUNITY): Payer: Self-pay | Admitting: *Deleted

## 2016-11-01 DIAGNOSIS — I119 Hypertensive heart disease without heart failure: Secondary | ICD-10-CM | POA: Diagnosis present

## 2016-11-01 DIAGNOSIS — I129 Hypertensive chronic kidney disease with stage 1 through stage 4 chronic kidney disease, or unspecified chronic kidney disease: Secondary | ICD-10-CM | POA: Diagnosis present

## 2016-11-01 DIAGNOSIS — Z8249 Family history of ischemic heart disease and other diseases of the circulatory system: Secondary | ICD-10-CM

## 2016-11-01 DIAGNOSIS — Z955 Presence of coronary angioplasty implant and graft: Secondary | ICD-10-CM

## 2016-11-01 DIAGNOSIS — N181 Chronic kidney disease, stage 1: Secondary | ICD-10-CM | POA: Diagnosis not present

## 2016-11-01 DIAGNOSIS — Z9114 Patient's other noncompliance with medication regimen: Secondary | ICD-10-CM | POA: Diagnosis not present

## 2016-11-01 DIAGNOSIS — R778 Other specified abnormalities of plasma proteins: Secondary | ICD-10-CM | POA: Insufficient documentation

## 2016-11-01 DIAGNOSIS — J449 Chronic obstructive pulmonary disease, unspecified: Secondary | ICD-10-CM | POA: Diagnosis present

## 2016-11-01 DIAGNOSIS — N19 Unspecified kidney failure: Secondary | ICD-10-CM

## 2016-11-01 DIAGNOSIS — Z794 Long term (current) use of insulin: Secondary | ICD-10-CM | POA: Diagnosis not present

## 2016-11-01 DIAGNOSIS — Z6841 Body Mass Index (BMI) 40.0 and over, adult: Secondary | ICD-10-CM

## 2016-11-01 DIAGNOSIS — I21A1 Myocardial infarction type 2: Principal | ICD-10-CM | POA: Diagnosis present

## 2016-11-01 DIAGNOSIS — E1122 Type 2 diabetes mellitus with diabetic chronic kidney disease: Secondary | ICD-10-CM | POA: Diagnosis present

## 2016-11-01 DIAGNOSIS — Z87442 Personal history of urinary calculi: Secondary | ICD-10-CM

## 2016-11-01 DIAGNOSIS — I509 Heart failure, unspecified: Secondary | ICD-10-CM | POA: Diagnosis not present

## 2016-11-01 DIAGNOSIS — N184 Chronic kidney disease, stage 4 (severe): Secondary | ICD-10-CM | POA: Diagnosis present

## 2016-11-01 DIAGNOSIS — Z833 Family history of diabetes mellitus: Secondary | ICD-10-CM | POA: Diagnosis not present

## 2016-11-01 DIAGNOSIS — N179 Acute kidney failure, unspecified: Secondary | ICD-10-CM | POA: Diagnosis present

## 2016-11-01 DIAGNOSIS — R05 Cough: Secondary | ICD-10-CM | POA: Diagnosis not present

## 2016-11-01 DIAGNOSIS — J410 Simple chronic bronchitis: Secondary | ICD-10-CM | POA: Diagnosis not present

## 2016-11-01 DIAGNOSIS — Z87891 Personal history of nicotine dependence: Secondary | ICD-10-CM

## 2016-11-01 DIAGNOSIS — G4733 Obstructive sleep apnea (adult) (pediatric): Secondary | ICD-10-CM | POA: Diagnosis not present

## 2016-11-01 DIAGNOSIS — I214 Non-ST elevation (NSTEMI) myocardial infarction: Secondary | ICD-10-CM

## 2016-11-01 DIAGNOSIS — M109 Gout, unspecified: Secondary | ICD-10-CM | POA: Diagnosis present

## 2016-11-01 DIAGNOSIS — I259 Chronic ischemic heart disease, unspecified: Secondary | ICD-10-CM | POA: Diagnosis present

## 2016-11-01 DIAGNOSIS — I2510 Atherosclerotic heart disease of native coronary artery without angina pectoris: Secondary | ICD-10-CM | POA: Diagnosis not present

## 2016-11-01 DIAGNOSIS — K219 Gastro-esophageal reflux disease without esophagitis: Secondary | ICD-10-CM | POA: Diagnosis present

## 2016-11-01 DIAGNOSIS — N170 Acute kidney failure with tubular necrosis: Secondary | ICD-10-CM

## 2016-11-01 DIAGNOSIS — N289 Disorder of kidney and ureter, unspecified: Secondary | ICD-10-CM | POA: Diagnosis not present

## 2016-11-01 DIAGNOSIS — E785 Hyperlipidemia, unspecified: Secondary | ICD-10-CM | POA: Diagnosis present

## 2016-11-01 DIAGNOSIS — Z79899 Other long term (current) drug therapy: Secondary | ICD-10-CM

## 2016-11-01 DIAGNOSIS — Z9981 Dependence on supplemental oxygen: Secondary | ICD-10-CM

## 2016-11-01 DIAGNOSIS — R748 Abnormal levels of other serum enzymes: Secondary | ICD-10-CM | POA: Diagnosis not present

## 2016-11-01 DIAGNOSIS — R7989 Other specified abnormal findings of blood chemistry: Secondary | ICD-10-CM | POA: Insufficient documentation

## 2016-11-01 DIAGNOSIS — R0602 Shortness of breath: Secondary | ICD-10-CM | POA: Diagnosis not present

## 2016-11-01 DIAGNOSIS — I10 Essential (primary) hypertension: Secondary | ICD-10-CM | POA: Diagnosis not present

## 2016-11-01 DIAGNOSIS — D649 Anemia, unspecified: Secondary | ICD-10-CM | POA: Diagnosis not present

## 2016-11-01 DIAGNOSIS — I130 Hypertensive heart and chronic kidney disease with heart failure and stage 1 through stage 4 chronic kidney disease, or unspecified chronic kidney disease: Secondary | ICD-10-CM | POA: Diagnosis not present

## 2016-11-01 DIAGNOSIS — E118 Type 2 diabetes mellitus with unspecified complications: Secondary | ICD-10-CM | POA: Diagnosis not present

## 2016-11-01 DIAGNOSIS — Z9119 Patient's noncompliance with other medical treatment and regimen: Secondary | ICD-10-CM

## 2016-11-01 DIAGNOSIS — D631 Anemia in chronic kidney disease: Secondary | ICD-10-CM | POA: Diagnosis present

## 2016-11-01 DIAGNOSIS — Z7982 Long term (current) use of aspirin: Secondary | ICD-10-CM

## 2016-11-01 DIAGNOSIS — N189 Chronic kidney disease, unspecified: Secondary | ICD-10-CM | POA: Diagnosis not present

## 2016-11-01 HISTORY — DX: Chronic kidney disease, unspecified: N17.9

## 2016-11-01 HISTORY — DX: Personal history of other diseases of the musculoskeletal system and connective tissue: Z87.39

## 2016-11-01 HISTORY — DX: Unspecified chronic bronchitis: J42

## 2016-11-01 HISTORY — DX: Type 2 diabetes mellitus without complications: E11.9

## 2016-11-01 HISTORY — DX: Dependence on supplemental oxygen: Z99.81

## 2016-11-01 HISTORY — DX: Personal history of urinary calculi: Z87.442

## 2016-11-01 HISTORY — DX: Acute kidney failure, unspecified: N18.9

## 2016-11-01 HISTORY — DX: Non-ST elevation (NSTEMI) myocardial infarction: I21.4

## 2016-11-01 HISTORY — DX: Anemia, unspecified: D64.9

## 2016-11-01 HISTORY — DX: Personal history of other medical treatment: Z92.89

## 2016-11-01 HISTORY — DX: Obstructive sleep apnea (adult) (pediatric): G47.33

## 2016-11-01 HISTORY — DX: Arthropathic psoriasis, unspecified: L40.50

## 2016-11-01 HISTORY — DX: Unspecified osteoarthritis, unspecified site: M19.90

## 2016-11-01 HISTORY — DX: Dependence on other enabling machines and devices: Z99.89

## 2016-11-01 LAB — PROTIME-INR
INR: 1.26
Prothrombin Time: 15.8 seconds — ABNORMAL HIGH (ref 11.4–15.2)

## 2016-11-01 LAB — CBC
HCT: 32.2 % — ABNORMAL LOW (ref 39.0–52.0)
Hemoglobin: 10.7 g/dL — ABNORMAL LOW (ref 13.0–17.0)
MCH: 29.7 pg (ref 26.0–34.0)
MCHC: 33.2 g/dL (ref 30.0–36.0)
MCV: 89.4 fL (ref 78.0–100.0)
PLATELETS: 164 10*3/uL (ref 150–400)
RBC: 3.6 MIL/uL — ABNORMAL LOW (ref 4.22–5.81)
RDW: 13.3 % (ref 11.5–15.5)
WBC: 10.5 10*3/uL (ref 4.0–10.5)

## 2016-11-01 LAB — BASIC METABOLIC PANEL
ANION GAP: 16 — AB (ref 5–15)
BUN: 90 mg/dL — AB (ref 6–20)
CALCIUM: 8.4 mg/dL — AB (ref 8.9–10.3)
CO2: 22 mmol/L (ref 22–32)
CREATININE: 4.28 mg/dL — AB (ref 0.61–1.24)
Chloride: 95 mmol/L — ABNORMAL LOW (ref 101–111)
GFR, EST AFRICAN AMERICAN: 16 mL/min — AB (ref >60)
GFR, EST NON AFRICAN AMERICAN: 13 mL/min — AB (ref >60)
Glucose, Bld: 264 mg/dL — ABNORMAL HIGH (ref 65–99)
Potassium: 4.7 mmol/L (ref 3.5–5.1)
SODIUM: 133 mmol/L — AB (ref 135–145)

## 2016-11-01 LAB — I-STAT TROPONIN, ED: Troponin i, poc: 4.64 ng/mL (ref 0.00–0.08)

## 2016-11-01 LAB — GLUCOSE, CAPILLARY
Glucose-Capillary: 142 mg/dL — ABNORMAL HIGH (ref 65–99)
Glucose-Capillary: 112 mg/dL — ABNORMAL HIGH (ref 65–99)

## 2016-11-01 LAB — TROPONIN I
Troponin I: 5.12 ng/mL (ref <0.03)
Troponin I: 5.22 ng/mL (ref <0.03)

## 2016-11-01 LAB — SAMPLE TO BLOOD BANK

## 2016-11-01 LAB — PROCALCITONIN: Procalcitonin: 0.91 ng/mL

## 2016-11-01 MED ORDER — GUAIFENESIN ER 600 MG PO TB12
600.0000 mg | ORAL_TABLET | Freq: Two times a day (BID) | ORAL | Status: DC | PRN
  Administered 2016-11-02 – 2016-11-03 (×3): 600 mg via ORAL
  Filled 2016-11-01 (×3): qty 1

## 2016-11-01 MED ORDER — SIMVASTATIN 40 MG PO TABS
40.0000 mg | ORAL_TABLET | Freq: Every day | ORAL | Status: DC
  Administered 2016-11-01: 40 mg via ORAL
  Filled 2016-11-01: qty 1

## 2016-11-01 MED ORDER — INSULIN ASPART 100 UNIT/ML ~~LOC~~ SOLN: 0.0000 [IU] | Freq: Every day | SUBCUTANEOUS | Status: DC

## 2016-11-01 MED ORDER — SODIUM CHLORIDE 0.9 % IV SOLN
INTRAVENOUS | Status: DC
  Administered 2016-11-01 – 2016-11-02 (×2): via INTRAVENOUS

## 2016-11-01 MED ORDER — ACETAMINOPHEN 325 MG PO TABS: 650.0000 mg | ORAL_TABLET | Freq: Four times a day (QID) | ORAL | Status: DC | PRN

## 2016-11-01 MED ORDER — LABETALOL HCL 100 MG PO TABS: 300.0000 mg | ORAL_TABLET | Freq: Every day | ORAL | Status: DC

## 2016-11-01 MED ORDER — TECHNETIUM TC 99M TETROFOSMIN IV KIT
30.0000 | PACK | Freq: Once | INTRAVENOUS | Status: AC | PRN
  Administered 2016-11-01: 30 via INTRAVENOUS

## 2016-11-01 MED ORDER — INSULIN ASPART 100 UNIT/ML ~~LOC~~ SOLN: 0.0000 [IU] | Freq: Three times a day (TID) | SUBCUTANEOUS | Status: DC

## 2016-11-01 MED ORDER — MAGNESIUM OXIDE 400 (241.3 MG) MG PO TABS
400.0000 mg | ORAL_TABLET | Freq: Every day | ORAL | Status: DC
  Administered 2016-11-01 – 2016-11-03 (×3): 400 mg via ORAL
  Filled 2016-11-01 (×3): qty 1

## 2016-11-01 MED ORDER — LABETALOL HCL 200 MG PO TABS
600.0000 mg | ORAL_TABLET | Freq: Two times a day (BID) | ORAL | Status: DC
  Filled 2016-11-01: qty 3

## 2016-11-01 MED ORDER — CARVEDILOL 12.5 MG PO TABS
12.5000 mg | ORAL_TABLET | Freq: Two times a day (BID) | ORAL | Status: DC
  Filled 2016-11-01: qty 1

## 2016-11-01 MED ORDER — BENZONATATE 100 MG PO CAPS
200.0000 mg | ORAL_CAPSULE | Freq: Three times a day (TID) | ORAL | Status: DC | PRN
  Administered 2016-11-01 – 2016-11-03 (×4): 200 mg via ORAL
  Filled 2016-11-01 (×4): qty 2

## 2016-11-01 MED ORDER — ASPIRIN EC 81 MG PO TBEC
81.0000 mg | DELAYED_RELEASE_TABLET | Freq: Every day | ORAL | Status: DC
  Administered 2016-11-02 – 2016-11-03 (×2): 81 mg via ORAL
  Filled 2016-11-01 (×2): qty 1

## 2016-11-01 MED ORDER — HYDRALAZINE HCL 25 MG PO TABS
25.0000 mg | ORAL_TABLET | Freq: Three times a day (TID) | ORAL | Status: DC
  Administered 2016-11-01 (×2): 25 mg via ORAL
  Filled 2016-11-01 (×3): qty 1

## 2016-11-01 MED ORDER — INSULIN ASPART 100 UNIT/ML ~~LOC~~ SOLN
0.0000 [IU] | Freq: Three times a day (TID) | SUBCUTANEOUS | Status: DC
  Administered 2016-11-01: 3 [IU] via SUBCUTANEOUS

## 2016-11-01 MED ORDER — MONTELUKAST SODIUM 10 MG PO TABS
10.0000 mg | ORAL_TABLET | Freq: Every day | ORAL | Status: DC
  Administered 2016-11-01 – 2016-11-02 (×2): 10 mg via ORAL
  Filled 2016-11-01 (×2): qty 1

## 2016-11-01 MED ORDER — ACETAMINOPHEN 650 MG RE SUPP: 650.0000 mg | Freq: Four times a day (QID) | RECTAL | Status: DC | PRN

## 2016-11-01 MED ORDER — INSULIN GLARGINE 100 UNIT/ML ~~LOC~~ SOLN
45.0000 [IU] | Freq: Two times a day (BID) | SUBCUTANEOUS | Status: DC
  Administered 2016-11-01 – 2016-11-03 (×4): 45 [IU] via SUBCUTANEOUS
  Filled 2016-11-01 (×4): qty 0.45

## 2016-11-01 MED ORDER — OXYCODONE HCL 5 MG PO TABS
5.0000 mg | ORAL_TABLET | ORAL | Status: DC | PRN
  Filled 2016-11-01: qty 1

## 2016-11-01 MED ORDER — HEPARIN SODIUM (PORCINE) 5000 UNIT/ML IJ SOLN
5000.0000 [IU] | Freq: Three times a day (TID) | INTRAMUSCULAR | Status: DC
  Administered 2016-11-01 – 2016-11-03 (×5): 5000 [IU] via SUBCUTANEOUS
  Filled 2016-11-01 (×6): qty 1

## 2016-11-01 MED ORDER — HYDRALAZINE HCL 20 MG/ML IJ SOLN
10.0000 mg | Freq: Four times a day (QID) | INTRAMUSCULAR | Status: DC | PRN
  Administered 2016-11-02: 10 mg via INTRAVENOUS
  Filled 2016-11-01: qty 1

## 2016-11-01 MED ORDER — CALCITRIOL 0.25 MCG PO CAPS
0.2500 ug | ORAL_CAPSULE | Freq: Every day | ORAL | Status: DC
  Administered 2016-11-01 – 2016-11-03 (×3): 0.25 ug via ORAL
  Filled 2016-11-01 (×3): qty 1

## 2016-11-01 MED ORDER — PRASUGREL HCL 10 MG PO TABS
10.0000 mg | ORAL_TABLET | Freq: Every day | ORAL | Status: DC
  Administered 2016-11-02 – 2016-11-03 (×2): 10 mg via ORAL
  Filled 2016-11-01 (×2): qty 1

## 2016-11-01 NOTE — H&P (Signed)
History and Physical    Kent Osborne WUX:[id number] DOB: 23-Oct-1950 DOA: 11/01/2016  PCP: Betty Martinique, MD Patient coming from: Home  Chief Complaint: Generalized weakness  HPI: Kent Osborne is a 66 y.o. male with medical history significant of CAD, DM, HLD, HTN, Nephrolithiasis, presenting for second time to ED complaining of generalized weakness and elevated trop.   Complaining of shortness of breath and generalized weakness. Seen at [facility] long emergency room 2 days ago and found to be anemic with hemoglobin of 8 and an elevated troponin around 6. Patient received 1 unit PRBC and was scheduled to be admitted to [facility] however patient deferred further treatment and left AMA. Symptoms have continued since that time. Patient states the symptoms continue to get worse. Denies actual chest pain, hematochezia, melena, palpitations, hematemesis. , hemoptysis.    ED Course: Objective findings outlined below.  Review of Systems: As per HPI otherwise 10 point review of systems negative.   Ambulatory Status: No restrictions  Past Medical History:  Diagnosis Date  . Asthma   . CAD (coronary artery disease) 12/31/07   [facility] stents- placed in the circumflex and LAD  . Diabetes mellitus   . Hyperlipidemia   . Hypertension   . Nephrolithiasis   . OA (osteoarthritis)   . Obese     Past Surgical History:  Procedure Laterality Date  . heart stents    . KIDNEY STONE SURGERY      Social History   Social History  . Marital status: Married    Spouse name: N/A  . Number of children: N/A  . Years of education: N/A   Occupational History  . Not on file.   Social History Main Topics  . Smoking status: Former Research scientist (life sciences)  . Smokeless tobacco: Never Used  . Alcohol use No  . Drug use: No  . Sexual activity: Not on file   Other Topics Concern  . Not on file   Social History Narrative  . No narrative on file    No Known Allergies  Family History    Problem Relation Age of Onset  . Diabetes Other   . Hyperlipidemia Other   . Hypertension Other     Prior to Admission medications   Medication Sig Start Date End Date Taking? Authorizing Provider  albuterol (PROVENTIL HFA;VENTOLIN HFA) 108 (90 Base) MCG/ACT inhaler Inhale 2 puffs into the lungs every 6 (six) hours as needed for wheezing or shortness of breath. 10/30/16   Betty G Martinique, MD  allopurinol (ZYLOPRIM) 300 MG tablet TAKE ONE-HALF TABLET BY MOUTH IN THE MORNING Patient taking differently: Take 150 mg by mouth daily.  11/02/15   Dorena Cookey, MD  aspirin EC 81 MG tablet Take 81 mg by mouth daily.    Historical Provider, MD  colchicine 0.6 MG tablet Take 1 tablet (0.6 mg total) by mouth daily. Patient taking differently: Take 0.6 mg by mouth daily as needed (for gout flares).  08/13/14   Dorena Cookey, MD  doxycycline (VIBRA-TABS) 100 MG tablet Take 1 tablet (100 mg total) by mouth 2 (two) times daily. 10/30/16 11/09/16  Betty G Martinique, MD  glipiZIDE (GLUCOTROL) 10 MG tablet TAKE ONE TABLET BY MOUTH TWICE DAILY BEFORE  A  MEAL Patient taking differently: Take 10 mg by mouth 2 (two) times daily before a meal.  11/02/15   Dorena Cookey, MD  hydrALAZINE (APRESOLINE) 25 MG tablet Take 25 mg by mouth 3 (three) times daily.  Historical Provider, MD  insulin aspart (NOVOLOG FLEXPEN) 100 UNIT/ML FlexPen INJECT 90 UNITS SUBCUTANEOUSLY ONCE DAILY Patient taking differently: Inject 21-25 Units into the skin 3 (three) times daily with meals. Pt uses per sliding scale. 11/02/15   Dorena Cookey, MD  Insulin Glargine (LANTUS SOLOSTAR) 100 UNIT/ML Solostar Pen INJECT 90 UNITS SUBQ AT BEDTIME Patient taking differently: Inject 45 Units into the skin 2 (two) times daily.  11/17/15   Dorena Cookey, MD  labetalol (NORMODYNE) 300 MG tablet TAKE TWO TABLETS BY MOUTH IN THE MORNING AND ONE AT NOON AND TWO AT BEDTIME - OFFICE VISIT NEEDED FOR FURTHER REFILLS Patient taking differently: Take 600 mg by  mouth 3 (three) times daily.  12/29/15   Dorena Cookey, MD  montelukast (SINGULAIR) 10 MG tablet Take 10 mg by mouth at bedtime.     Historical Provider, MD  ondansetron (ZOFRAN) 4 MG tablet Take 1 tablet (4 mg total) by mouth every 8 (eight) hours as needed for nausea or vomiting. 10/30/16 11/04/16  Betty G Martinique, MD  prasugrel (EFFIENT) 10 MG TABS Take 10 mg by mouth daily.      Historical Provider, MD  predniSONE (DELTASONE) 20 MG tablet Take 2 tablets (40 mg total) by mouth daily with breakfast. 10/30/16 11/02/16  Betty G Martinique, MD  ranitidine (ZANTAC) 300 MG tablet TAKE ONE TABLET BY MOUTH AT BEDTIME 08/11/16   Betty G Martinique, MD  simvastatin (ZOCOR) 40 MG tablet TAKE ONE TABLET BY MOUTH ONCE DAILY WITH BREAKFAST - OFFICE VISIT NEEDED FOR FURTHER REFILLS Patient taking differently: Take 40 mg by mouth at bedtime.  11/02/15   Dorena Cookey, MD  torsemide (DEMADEX) 20 MG tablet TAKE ONE TABLET BY MOUTH TWICE DAILY 08/10/14   Dorena Cookey, MD    Physical Exam: Vitals:   11/01/16 1315 11/01/16 1318 11/01/16 1330 11/01/16 1601  BP: 170/67 174/78 172/56 (!) 174/67  Pulse: (!) 52 (!) 56 (!) 57 (!) 58  Resp: 23 16  20   Temp:    98.4 F (36.9 C)  TempSrc:    Oral  SpO2: 97% 95% 96% 96%  Weight:    129.8 kg (286 lb 3.2 oz)  Height:    5\' 8"  (1.727 m)     General:  Appears calm and comfortable Eyes:  PERRL, EOMI, normal lids, iris ENT:  grossly normal hearing, lips & tongue, mmm Neck:  no LAD, masses or thyromegaly Cardiovascular:  RRR, II/VI systolic murmur. Trace bilat LE edema.  Respiratory:  CTA bilaterally, no w/r/r. Normal respiratory effort. Abdomen:  soft, ntnd, NABS Skin:  no rash or induration seen on limited exam Musculoskeletal:  grossly normal tone BUE/BLE, good ROM, no bony abnormality Psychiatric:  grossly normal mood and affect, speech fluent and appropriate, AOx3 Neurologic:  CN 2-12 grossly intact, moves all extremities in coordinated fashion, sensation intact  Labs  on Admission: I have personally reviewed following labs and imaging studies  CBC:  Recent Labs Lab 10/30/16 1202 11/01/16 1032  WBC 12.5* 10.5  NEUTROABS 8.9*  --   HGB 6.6* 10.7*  HCT 19.8* 32.2*  MCV 91.7 89.4  PLT 165 694   Basic Metabolic Panel:  Recent Labs Lab 10/30/16 1202 11/01/16 1032  NA 130* 133*  K 4.1 4.7  CL 95* 95*  CO2 23 22  GLUCOSE 97 264*  BUN 61* 90*  CREATININE 4.44* 4.28*  CALCIUM 8.0* 8.4*   GFR: Estimated Creatinine Clearance: 23.2 mL/min (by C-G formula based on SCr of  4.28 mg/dL (H)). Liver Function Tests:  Recent Labs Lab 10/30/16 1202  AST 43*  ALT 19  ALKPHOS 44  BILITOT 0.9  PROT 6.7  ALBUMIN 3.3*   No results for input(s): LIPASE, AMYLASE in the last 168 hours. No results for input(s): AMMONIA in the last 168 hours. Coagulation Profile:  Recent Labs Lab 11/01/16 1032  INR 1.26   Cardiac Enzymes:  Recent Labs Lab 10/30/16 1202  TROPONINI 6.59*   BNP (last 3 results) No results for input(s): PROBNP in the last 8760 hours. HbA1C:  Recent Labs  10/30/16 1217  HGBA1C 7.0*   CBG:  Recent Labs Lab 11/01/16 1621  GLUCAP 142*   Lipid Profile:  Recent Labs  10/30/16 1217  CHOL 104  HDL 24.90*  LDLCALC 45  TRIG 170.0*  CHOLHDL 4   Thyroid Function Tests: No results for input(s): TSH, T4TOTAL, FREET4, T3FREE, THYROIDAB in the last 72 hours. Anemia Panel:  Recent Labs  10/30/16 1208  RETICCTPCT 1.2   Urine analysis:    Component Value Date/Time   COLORURINE yellow 07/27/2010 0846   APPEARANCEUR Clear 07/27/2010 0846   LABSPEC 1.020 07/27/2010 0846   PHURINE 5.0 07/27/2010 0846   HGBUR 3+ 07/27/2010 0846   BILIRUBINUR n 11/02/2015 1240   PROTEINUR 1+ 11/02/2015 1240   UROBILINOGEN 0.2 11/02/2015 1240   UROBILINOGEN 0.2 07/27/2010 0846   NITRITE n 11/02/2015 1240   NITRITE negative 07/27/2010 0846   LEUKOCYTESUR Negative 11/02/2015 1240    Creatinine Clearance: Estimated Creatinine  Clearance: 23.2 mL/min (by C-G formula based on SCr of 4.28 mg/dL (H)).  Sepsis Labs: @LABRCNTIP (procalcitonin:4,lacticidven:4) )No results found for this or any previous visit (from the past 240 hour(s)).   Radiological Exams on Admission: Dg Chest 2 View  Result Date: 11/01/2016 CLINICAL DATA:  Cough for 2 weeks.  Fever over 100 degrees. EXAM: CHEST  2 VIEW COMPARISON:  10/30/2016 FINDINGS: There is mild bilateral interstitial prominence. There is no focal parenchymal opacity. There is no pleural effusion or pneumothorax. There is stable cardiomegaly. The osseous structures are unremarkable. IMPRESSION: Cardiomegaly with mild pulmonary vascular congestion. Electronically Signed   By: Kathreen Devoid   On: 11/01/2016 11:56    EKG: Independently reviewed. Sinus. No ACS  Assessment/Plan Active Problems:   Essential hypertension   Coronary atherosclerosis   Elevated troponin   NSTEMI (non-ST elevated myocardial infarction) (HCC)   Acute renal failure superimposed on chronic kidney disease (HCC)   Diabetes mellitus with complication (HCC)   COPD (chronic obstructive pulmonary disease) (HCC)   OSA (obstructive sleep apnea)   NSTEMI: Trop downtrending from >6 to 4 at time of admission. No CP/SOB/Palp. H/o CAD s/p Cath w/ stent placement in LAD. States compliant w/ ASA and Effient.  - Further management per cardiology. Pt is not a good cath candidtate given his deteriorating renal function.  - continue ASA, bblocker - Cycle trop, Tele - Echo (cardiomegaly on CXR)   AoCKD: Cr 4.44. Baseline 2.5. Sees renal in [place]. Suspect acute injury from profound anemia and hypoperfusion in setting of hyperglycemia and HTN vs chronic worsening and pt rapidly moving towards dialysis.  - Renal US - IVF - BMET in am - Renal consult in am if not improving - hold metolazone, Torsemide  HTN: - continue labetalol, hydralazine - Hold metolazone, torsemide  COPD/OSA: No signs of exacerbation  -  continue Albuterol PRN - CPAP QHS  GERD: - continue zantac  DM: - continue Lantus - SSI  DVT prophylaxis: Hep  Code  Status: full  Family Communication: none  Disposition Plan: pending improvement in renal and cardiac function and workup  Consults called: cardiology  Admission status: inpt    Camyla Camposano J MD [facility]  If 7PM-7AM, please contact night-coverage [email or web address] Password [facility]  11/01/2016, 4:58 PM

## 2016-11-01 NOTE — ED Triage Notes (Signed)
Pt reports being at [facility] on 3/12 or sob and weakness, had blood transfusion and they wanted to admit him for endo but pt refused and went home. Pt at home and now has sob, increase in weakness and non productive cough.

## 2016-11-01 NOTE — ED Notes (Signed)
Notified dr pickering of troponin 4.64

## 2016-11-01 NOTE — Progress Notes (Signed)
Pts BP 174/67, PA paged.  Will continue to monitor.

## 2016-11-01 NOTE — ED Provider Notes (Signed)
[place] DEPT Provider Note   CSN: [id number] Arrival date & time: 11/01/16  1011     History   Chief Complaint Chief Complaint  Patient presents with  . Weakness  . Shortness of Breath    HPI Kent Osborne is a 66 y.o. male.  HPI   Patient presents with shortness of breath and generalized weakness. He was seen for similar symptoms 2 days ago [facility]. Found to have an anemia with a hemoglobin 8 and elevated troponin of 6. Transfuse 1 unit of blood and was to be admitted at [facility], however he was not willing to stay and left AMA. He was told to follow-up with his cardiologist and his nephrologist. He was not able to get this done. He returns today with continued symptoms. Still has some shortness of breath. Had no chest pain before after this. No blood in the stool or black stools. Has had a cough with no real sputum production. No fevers. Previous stents and is on some anticoagulation. Past Medical History:  Diagnosis Date  . Asthma   . CAD (coronary artery disease) 12/31/07   [facility] stents- placed in the circumflex and LAD  . Diabetes mellitus   . Hyperlipidemia   . Hypertension   . Nephrolithiasis   . OA (osteoarthritis)   . Obese     Patient Active Problem List   Diagnosis Date Noted  . Symptomatic anemia 10/30/2016  . BMI 45.0-49.9, adult ([place]) 04/29/2016  . Gout, arthropathy 04/27/2016  . Diabetic peripheral neuropathy associated with type 2 diabetes mellitus ([place]) 08/20/2014  . Type II diabetes mellitus with circulatory disorder, uncontrolled 07/09/2014  . Renal insufficiency 02/29/2012  . HEMATURIA 07/27/2010  . GOUT, UNSPECIFIED 07/05/2010  . BACK PAIN, LUMBAR 04/18/2010  . Backache 01/06/2010  . GENERALIZED OSTEOARTHROSIS INVOLVING HAND 07/20/2009  . Chronic kidney disease (CKD), stage IV (severe) ([place]) 04/12/2009  . PERIPHERAL EDEMA 12/31/2008  . Asthma with acute exacerbation 10/08/2008  . Osteoarthritis 07/28/2008    . Hyperlipemia 01/06/2008  . CORONARY ARTERY DISEASE 01/06/2008  . CORONARY ARTERY DISEASE, S/P PTCA 12/31/2007  . Essential hypertension 02/21/2007  . CHOLELITHIASIS 02/21/2007  . NEPHROLITHIASIS, HX OF 02/21/2007    Past Surgical History:  Procedure Laterality Date  . heart stents    . KIDNEY STONE SURGERY         Home Medications    Prior to Admission medications   Medication Sig Start Date End Date Taking? Authorizing Provider  albuterol (PROVENTIL HFA;VENTOLIN HFA) 108 (90 Base) MCG/ACT inhaler Inhale 2 puffs into the lungs every 6 (six) hours as needed for wheezing or shortness of breath. 10/30/16   Betty G Martinique, MD  allopurinol (ZYLOPRIM) 300 MG tablet TAKE ONE-HALF TABLET BY MOUTH IN THE MORNING Patient taking differently: Take 150 mg by mouth daily.  11/02/15   Dorena Cookey, MD  aspirin EC 81 MG tablet Take 81 mg by mouth daily.    Historical Provider, MD  colchicine 0.6 MG tablet Take 1 tablet (0.6 mg total) by mouth daily. Patient taking differently: Take 0.6 mg by mouth daily as needed (for gout flares).  08/13/14   Dorena Cookey, MD  doxycycline (VIBRA-TABS) 100 MG tablet Take 1 tablet (100 mg total) by mouth 2 (two) times daily. 10/30/16 11/09/16  Betty G Martinique, MD  glipiZIDE (GLUCOTROL) 10 MG tablet TAKE ONE TABLET BY MOUTH TWICE DAILY BEFORE  A  MEAL Patient taking differently: Take 10 mg by mouth 2 (two) times daily  before a meal.  11/02/15   Dorena Cookey, MD  hydrALAZINE (APRESOLINE) 25 MG tablet Take 25 mg by mouth 3 (three) times daily.    Historical Provider, MD  insulin aspart (NOVOLOG FLEXPEN) 100 UNIT/ML FlexPen INJECT 90 UNITS SUBCUTANEOUSLY ONCE DAILY Patient taking differently: Inject 21-25 Units into the skin 3 (three) times daily with meals. Pt uses per sliding scale. 11/02/15   Dorena Cookey, MD  Insulin Glargine (LANTUS SOLOSTAR) 100 UNIT/ML Solostar Pen INJECT 90 UNITS SUBQ AT BEDTIME Patient taking differently: Inject 45 Units into the skin 2  (two) times daily.  11/17/15   Dorena Cookey, MD  labetalol (NORMODYNE) 300 MG tablet TAKE TWO TABLETS BY MOUTH IN THE MORNING AND ONE AT NOON AND TWO AT BEDTIME - OFFICE VISIT NEEDED FOR FURTHER REFILLS Patient taking differently: Take 600 mg by mouth 3 (three) times daily.  12/29/15   Dorena Cookey, MD  montelukast (SINGULAIR) 10 MG tablet Take 10 mg by mouth at bedtime.     Historical Provider, MD  ondansetron (ZOFRAN) 4 MG tablet Take 1 tablet (4 mg total) by mouth every 8 (eight) hours as needed for nausea or vomiting. 10/30/16 11/04/16  Betty G Martinique, MD  prasugrel (EFFIENT) 10 MG TABS Take 10 mg by mouth daily.      Historical Provider, MD  predniSONE (DELTASONE) 20 MG tablet Take 2 tablets (40 mg total) by mouth daily with breakfast. 10/30/16 11/02/16  Betty G Martinique, MD  ranitidine (ZANTAC) 300 MG tablet TAKE ONE TABLET BY MOUTH AT BEDTIME 08/11/16   Betty G Martinique, MD  simvastatin (ZOCOR) 40 MG tablet TAKE ONE TABLET BY MOUTH ONCE DAILY WITH BREAKFAST - OFFICE VISIT NEEDED FOR FURTHER REFILLS Patient taking differently: Take 40 mg by mouth at bedtime.  11/02/15   Dorena Cookey, MD  torsemide (DEMADEX) 20 MG tablet TAKE ONE TABLET BY MOUTH TWICE DAILY 08/10/14   Dorena Cookey, MD    Family History Family History  Problem Relation Age of Onset  . Diabetes Other   . Hyperlipidemia Other   . Hypertension Other     Social History Social History  Substance Use Topics  . Smoking status: Former Research scientist (life sciences)  . Smokeless tobacco: Never Used  . Alcohol use No     Allergies   Patient has no known allergies.   Review of Systems Review of Systems  Constitutional: Positive for fatigue. Negative for appetite change.  HENT: Positive for congestion.   Respiratory: Positive for cough and shortness of breath.   Cardiovascular: Negative for chest pain.  Gastrointestinal: Negative for abdominal pain and blood in stool.  Genitourinary: Negative for flank pain.  Musculoskeletal: Negative for  back pain.  Neurological: Positive for weakness.  Hematological: Negative for adenopathy.  Psychiatric/Behavioral: Negative for confusion.     Physical Exam Updated Vital Signs BP 159/58 (BP Location: Left Arm)   Pulse (!) 59   Temp 97.8 F (36.6 C) (Oral)   Resp 21   Ht 5\' 8"  (1.727 m)   Wt 295 lb (133.8 kg)   SpO2 95%   BMI 44.85 kg/m   Physical Exam  Constitutional: He appears well-developed.  HENT:  Head: Atraumatic.  Eyes: Pupils are equal, round, and reactive to light.  Neck: Neck supple.  Cardiovascular: Normal rate.   Pulmonary/Chest: Effort normal.  Abdominal: Soft. There is no tenderness.  Musculoskeletal: He exhibits no edema or tenderness.  Neurological: He is alert.  Skin: Capillary refill takes less than 2 seconds.  There is pallor.     ED Treatments / Results  Labs (all labs ordered are listed, but only abnormal results are displayed) Labs Reviewed  BASIC METABOLIC PANEL - Abnormal; Notable for the following:       Result Value   Sodium 133 (*)    Chloride 95 (*)    Glucose, Bld 264 (*)    BUN 90 (*)    Creatinine, Ser 4.28 (*)    Calcium 8.4 (*)    GFR calc non Af Amer 13 (*)    GFR calc Af Amer 16 (*)    Anion gap 16 (*)    All other components within normal limits  CBC - Abnormal; Notable for the following:    RBC 3.60 (*)    Hemoglobin 10.7 (*)    HCT 32.2 (*)    All other components within normal limits  PROTIME-INR - Abnormal; Notable for the following:    Prothrombin Time 15.8 (*)    All other components within normal limits  I-STAT TROPOININ, ED - Abnormal; Notable for the following:    Troponin i, poc 4.64 (*)    All other components within normal limits    EKG  EKG Interpretation  Date/Time:  Wednesday November 01 2016 10:15:31 EDT Ventricular Rate:  59 PR Interval:    QRS Duration: 118 QT Interval:  526 QTC Calculation: 520 R Axis:   -100 Text Interpretation:  Sinus bradycardia with 1st degree A-V block Right superior  axis deviation Non-specific intra-ventricular conduction delay Prolonged QT Abnormal ECG Confirmed by LONG MD, JOSHUA [phone]), editor Drema Pry [phone]) on 11/01/2016 10:43:15 AM Also confirmed by Alvino Chapel  MD, Clarnce Homan [phone])  on 11/01/2016 11:28:26 AM       Radiology Dg Chest 2 View  Result Date: 10/30/2016 CLINICAL DATA:  Dyspnea EXAM: CHEST  2 VIEW COMPARISON:  05/28/2016 chest radiograph. FINDINGS: Stable cardiomediastinal silhouette with mild cardiomegaly and aortic atherosclerosis. No pneumothorax. No pleural effusion. Mild pulmonary edema. IMPRESSION: Mild congestive heart failure. Aortic atherosclerosis. Electronically Signed   By: Ilona Sorrel M.D.   On: 10/30/2016 12:23    Procedures Procedures (including critical care time)  Medications Ordered in ED Medications - No data to display   Initial Impression / Assessment and Plan / ED Course  I have reviewed the triage vital signs and the nursing notes.  Pertinent labs & imaging results that were available during my care of the patient were reviewed by me and considered in my medical decision making (see chart for details).     Patient with anemia. Transfused 2 days ago but not willing to stay in the hospital. Also elevated troponin. EKG overall reassuring and stable. Guaiac-negative 2 days ago. Hemoglobin has been stable from 2 days ago. Will admit to internal medicine.  Final Clinical Impressions(s) / ED Diagnoses   Final diagnoses:  Renal insufficiency  Elevated troponin  Anemia, unspecified type    New Prescriptions New Prescriptions   No medications on file     Davonna Belling, MD 11/01/16 1148

## 2016-11-01 NOTE — Plan of Care (Signed)
Troponin 5.12 which is trending downward. Cardiology following for this. See their note.  KJKG, NP [facility]

## 2016-11-01 NOTE — Consult Note (Signed)
Reason for Consult:   Elevated TSH  Requesting Physician: [facility] Primary Cardiologist Dr Cristela [facility]  HPI:   66 y/o morbidly obese male from [place]. He lives in his own home with his son. 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 He is followed by Dr Rosalita Chessman (cardiology) and Dr Marlowe Sax (nephrology) in [place]. He is also followed by [facility] here in [place]. He has a history of CAD, s/p LAD (x2) and CFX stenting in 2009 in [place]. He has not been restudied secondary to stage 4 CRI. He tells me he does get nuclear stress tests almost yearly. Other problems include IDDM, HTN, HLD, OSA-C-pap, and asthmatic COPD.   He was sent to [facility] ED on 10/30/16 by his PCP after he presented there with weakness. He says he was too weak to get into his car. In the ED at [facility] he had a Hgb of 6.6  (stool guaiac negative) and a Troponin of 6. He was transfused.  Admission and transfer to [facility] was recommended but the pt declined and left AMA with plans to f/u with his MD's in [place]. He has felt better since he was in the ED 48 hrs ago. His daughter heard about this and made him come back to the ED at [facility] today for further evaluation. Through all of this he denies chest pain.    PMHx:  Past Medical History:  Diagnosis Date  . Asthma   . CAD (coronary artery disease) 12/31/07   [facility] stents- placed in the circumflex and LAD  . Diabetes mellitus   . Hyperlipidemia   . Hypertension   . Nephrolithiasis   . OA (osteoarthritis)   . Obese     Past Surgical History:  Procedure Laterality Date  . heart stents    . KIDNEY STONE SURGERY      SOCHx:  reports that he has quit smoking. He has never used smokeless tobacco. He reports that he does not drink alcohol or use drugs.  FAMHx: Family History  Problem Relation Age of Onset  . Diabetes Other   . Hyperlipidemia Other   . Hypertension Other     ALLERGIES: No Known Allergies  ROS: Review  of Systems: General: negative for chills, fever, night sweats or weight changes.  Cardiovascular: negative for chest pain, orthopnea, palpitations, paroxysmal nocturnal dyspnea HEENT: negative for any visual disturbances, blindness, glaucoma Dermatological: negative for rash Respiratory: negative for hemoptysis, or wheezing Urologic: negative for hematuria or dysuria Abdominal: negative for nausea, vomiting, diarrhea, bright red blood per rectum, melena, or hematemesis Neurologic: negative for visual changes, syncope, or dizziness Musculoskeletal: negative for back pain, joint pain, or swelling Psych: cooperative and appropriate All other systems reviewed and are otherwise negative except as noted above.   HOME MEDICATIONS: Prior to Admission medications   Medication Sig Start Date End Date Taking? Authorizing Provider  albuterol (PROVENTIL HFA;VENTOLIN HFA) 108 (90 Base) MCG/ACT inhaler Inhale 2 puffs into the lungs every 6 (six) hours as needed for wheezing or shortness of breath. 10/30/16   Betty G Martinique, MD  allopurinol (ZYLOPRIM) 300 MG tablet TAKE ONE-HALF TABLET BY MOUTH IN THE MORNING Patient taking differently: Take 150 mg by mouth daily.  11/02/15   Dorena Cookey, MD  aspirin EC 81 MG tablet Take 81 mg by mouth daily.    Historical Provider, MD  colchicine 0.6 MG tablet Take 1 tablet (0.6 mg total) by mouth daily. Patient taking  differently: Take 0.6 mg by mouth daily as needed (for gout flares).  08/13/14   Dorena Cookey, MD  doxycycline (VIBRA-TABS) 100 MG tablet Take 1 tablet (100 mg total) by mouth 2 (two) times daily. 10/30/16 11/09/16  Betty G Martinique, MD  glipiZIDE (GLUCOTROL) 10 MG tablet TAKE ONE TABLET BY MOUTH TWICE DAILY BEFORE  A  MEAL Patient taking differently: Take 10 mg by mouth 2 (two) times daily before a meal.  11/02/15   Dorena Cookey, MD  hydrALAZINE (APRESOLINE) 25 MG tablet Take 25 mg by mouth 3 (three) times daily.    Historical Provider, MD  insulin aspart  (NOVOLOG FLEXPEN) 100 UNIT/ML FlexPen INJECT 90 UNITS SUBCUTANEOUSLY ONCE DAILY Patient taking differently: Inject 21-25 Units into the skin 3 (three) times daily with meals. Pt uses per sliding scale. 11/02/15   Dorena Cookey, MD  Insulin Glargine (LANTUS SOLOSTAR) 100 UNIT/ML Solostar Pen INJECT 90 UNITS SUBQ AT BEDTIME Patient taking differently: Inject 45 Units into the skin 2 (two) times daily.  11/17/15   Dorena Cookey, MD  labetalol (NORMODYNE) 300 MG tablet TAKE TWO TABLETS BY MOUTH IN THE MORNING AND ONE AT NOON AND TWO AT BEDTIME - OFFICE VISIT NEEDED FOR FURTHER REFILLS Patient taking differently: Take 600 mg by mouth 3 (three) times daily.  12/29/15   Dorena Cookey, MD  montelukast (SINGULAIR) 10 MG tablet Take 10 mg by mouth at bedtime.     Historical Provider, MD  ondansetron (ZOFRAN) 4 MG tablet Take 1 tablet (4 mg total) by mouth every 8 (eight) hours as needed for nausea or vomiting. 10/30/16 11/04/16  Betty G Martinique, MD  prasugrel (EFFIENT) 10 MG TABS Take 10 mg by mouth daily.      Historical Provider, MD  predniSONE (DELTASONE) 20 MG tablet Take 2 tablets (40 mg total) by mouth daily with breakfast. 10/30/16 11/02/16  Betty G Martinique, MD  ranitidine (ZANTAC) 300 MG tablet TAKE ONE TABLET BY MOUTH AT BEDTIME 08/11/16   Betty G Martinique, MD  simvastatin (ZOCOR) 40 MG tablet TAKE ONE TABLET BY MOUTH ONCE DAILY WITH BREAKFAST - OFFICE VISIT NEEDED FOR FURTHER REFILLS Patient taking differently: Take 40 mg by mouth at bedtime.  11/02/15   Dorena Cookey, MD  torsemide (DEMADEX) 20 MG tablet TAKE ONE TABLET BY MOUTH TWICE DAILY 08/10/14   Dorena Cookey, MD    HOSPITAL MEDICATIONS: I have reviewed the patient's current medications.  VITALS: Blood pressure 176/67, pulse (!) 56, temperature 97.8 F (36.6 C), temperature source Oral, resp. rate 20, height 5\' 8"  (1.727 m), weight 295 lb (133.8 kg), SpO2 93 %.  PHYSICAL EXAM: General appearance: alert, cooperative, no distress and morbidly  obese Neck: no carotid bruit, no JVD and thick neck Lungs: clear to auscultation bilaterally Heart: regular rate and rhythm Abdomen: obese Extremities: no edema Pulses: 2+ and symmetric Skin: Skin color, texture, turgor normal. No rashes or lesions Neurologic: Grossly normal  LABS: Results for orders placed or performed during the hospital encounter of 11/01/16 (from the past 24 hour(s))  Basic metabolic panel     Status: Abnormal   Collection Time: 11/01/16 10:32 AM  Result Value Ref Range   Sodium 133 (L) 135 - 145 mmol/L   Potassium 4.7 3.5 - 5.1 mmol/L   Chloride 95 (L) 101 - 111 mmol/L   CO2 22 22 - 32 mmol/L   Glucose, Bld 264 (H) 65 - 99 mg/dL   BUN 90 (H) 6 - 20  mg/dL   Creatinine, Ser 4.28 (H) 0.61 - 1.24 mg/dL   Calcium 8.4 (L) 8.9 - 10.3 mg/dL   GFR calc non Af Amer 13 (L) >60 mL/min   GFR calc Af Amer 16 (L) >60 mL/min   Anion gap 16 (H) 5 - 15  CBC     Status: Abnormal   Collection Time: 11/01/16 10:32 AM  Result Value Ref Range   WBC 10.5 4.0 - 10.5 K/uL   RBC 3.60 (L) 4.22 - 5.81 MIL/uL   Hemoglobin 10.7 (L) 13.0 - 17.0 g/dL   HCT 32.2 (L) 39.0 - 52.0 %   MCV 89.4 78.0 - 100.0 fL   MCH 29.7 26.0 - 34.0 pg   MCHC 33.2 30.0 - 36.0 g/dL   RDW 13.3 11.5 - 15.5 %   Platelets 164 150 - 400 K/uL  Protime-INR (order if Patient is taking Coumadin / Warfarin)     Status: Abnormal   Collection Time: 11/01/16 10:32 AM  Result Value Ref Range   Prothrombin Time 15.8 (H) 11.4 - 15.2 seconds   INR 1.26   I-stat troponin, ED     Status: Abnormal   Collection Time: 11/01/16 11:04 AM  Result Value Ref Range   Troponin i, poc 4.64 (HH) 0.00 - 0.08 ng/mL   Comment NOTIFIED PHYSICIAN    Comment 3            EKG: NSR, SB-59, 1st degree AVB, QTc-520.   IMAGING: Dg Chest 2 View  Result Date: 11/01/2016 CLINICAL DATA:  Cough for 2 weeks.  Fever over 100 degrees. EXAM: CHEST  2 VIEW COMPARISON:  10/30/2016 FINDINGS: There is mild bilateral interstitial prominence. There  is no focal parenchymal opacity. There is no pleural effusion or pneumothorax. There is stable cardiomegaly. The osseous structures are unremarkable. IMPRESSION: Cardiomegaly with mild pulmonary vascular congestion. Electronically Signed   By: Kathreen Devoid   On: 11/01/2016 11:56    IMPRESSION:  NSTEMI- Suspect the pt had demand ischemia NSTEMI around 10/30/16. He denies chest pain now and his Troponin is coming down.   CAD- S/P PCI with stents to LAD (x2) and CFX in 2009-[facility]. The pt tells me he has had a nuclear stress in the past year.   Anemia-  Hgb 6.6 on 10/30/16- probably secondary to chronic renal disease as he was heme negative. He has improved symptomatically after transfusion and his Hgb is 10. 7 today   CRI- Stage 4. He has been followed by Dr Marlowe Sax in [place]. He does not have HD access yet. He is not a candidate for cath secondary to CRI.   IDDM- Per primary team  Obesity and sleep apnea-  On C-pap and O2 at night  RECOMMENDATION: MD to see. We'll check echo and 2 day Myoview (rest portion to be done today). Document TSH (high in Oct 2017). Will back off on home dose beta blocker (Normadyne 600am-300 pm-600 HS) with his long 1st degree and SB, try Coreg 12.5 mg BID but this may need to be increased. Avoid QT prolonging agents.  Check troponin in am.   I spoke with Dr Milta Deiters office and they will fax records to the cath lab but they have not seen him in more than a year. .   Time Spent Directly with Patient: [address] minutes  Kerin Ransom, Advance beeper 11/01/2016, 12:47 PM   Patient examined chart reviewed. Patient followed by [facility] cardiology Dr Alroy Dust. History of stent LAD/Circumflex 2009 He indicates no f/u  cath or stress tests due to CRF. He is not having SSCP. Dyspnea thought related to severe anemia. Seen at [facility] 2 days ago with Hb 6 Left AMA after transfusion. Daughter convinced him to come back [facility] today. Troponin was 6 and now 4. ECG no acute  ST/T changes. He has been taking ASA and Effient. Exam with obes white male. Lungs clear no murmur Plus 1 edema with bilateral varicosities.  Given DM with Cr over 4 he is not a cath candidate unless he was having an acute MI. With no ECG changes, no chest pain and troponin coming down will continue to Rx medically  IM service to w/u anemia and worsening renal function. Tight DM contorl and will wear CPAP in hospital Suspect he can be d/c when acute medical issues w/u and f/u with Dr Alroy Dust for outpatient myovue   Jenkins Rouge

## 2016-11-02 ENCOUNTER — Inpatient Hospital Stay (HOSPITAL_COMMUNITY): Payer: Medicare Other

## 2016-11-02 ENCOUNTER — Other Ambulatory Visit (HOSPITAL_COMMUNITY): Payer: Medicare Other

## 2016-11-02 DIAGNOSIS — N181 Chronic kidney disease, stage 1: Secondary | ICD-10-CM

## 2016-11-02 DIAGNOSIS — E118 Type 2 diabetes mellitus with unspecified complications: Secondary | ICD-10-CM

## 2016-11-02 DIAGNOSIS — I2510 Atherosclerotic heart disease of native coronary artery without angina pectoris: Secondary | ICD-10-CM

## 2016-11-02 DIAGNOSIS — J410 Simple chronic bronchitis: Secondary | ICD-10-CM

## 2016-11-02 DIAGNOSIS — I10 Essential (primary) hypertension: Secondary | ICD-10-CM

## 2016-11-02 DIAGNOSIS — N170 Acute kidney failure with tubular necrosis: Secondary | ICD-10-CM

## 2016-11-02 DIAGNOSIS — D649 Anemia, unspecified: Secondary | ICD-10-CM

## 2016-11-02 DIAGNOSIS — I214 Non-ST elevation (NSTEMI) myocardial infarction: Secondary | ICD-10-CM

## 2016-11-02 DIAGNOSIS — I509 Heart failure, unspecified: Secondary | ICD-10-CM

## 2016-11-02 LAB — TROPONIN I
Troponin I: 4.73 ng/mL (ref <0.03)
Troponin I: 5.87 ng/mL (ref <0.03)

## 2016-11-02 LAB — GLUCOSE, CAPILLARY
GLUCOSE-CAPILLARY: 70 mg/dL (ref 65–99)
GLUCOSE-CAPILLARY: 116 mg/dL — AB (ref 65–99)
Glucose-Capillary: 100 mg/dL — ABNORMAL HIGH (ref 65–99)
Glucose-Capillary: 130 mg/dL — ABNORMAL HIGH (ref 65–99)

## 2016-11-02 LAB — HIV ANTIBODY (ROUTINE TESTING W REFLEX): HIV SCREEN 4TH GENERATION: NONREACTIVE

## 2016-11-02 LAB — NM MYOCAR MULTI W/SPECT W/WALL MOTION / EF
Estimated workload: 1 METS
Exercise duration (min): 0 min
Exercise duration (sec): 0 s
MPHR: 157 {beats}/min
Peak HR: 98 {beats}/min
Percent HR: 0 %
RPE: 0
Rest HR: 82 {beats}/min

## 2016-11-02 LAB — BASIC METABOLIC PANEL
ANION GAP: 12 (ref 5–15)
BUN: 97 mg/dL — ABNORMAL HIGH (ref 6–20)
CHLORIDE: 98 mmol/L — AB (ref 101–111)
CO2: 25 mmol/L (ref 22–32)
Calcium: 8 mg/dL — ABNORMAL LOW (ref 8.9–10.3)
Creatinine, Ser: 4.23 mg/dL — ABNORMAL HIGH (ref 0.61–1.24)
GFR calc Af Amer: 16 mL/min — ABNORMAL LOW (ref >60)
GFR calc non Af Amer: 14 mL/min — ABNORMAL LOW (ref >60)
GLUCOSE: 91 mg/dL (ref 65–99)
POTASSIUM: 3.9 mmol/L (ref 3.5–5.1)
Sodium: 135 mmol/L (ref 135–145)

## 2016-11-02 LAB — CBC
HEMATOCRIT: 29.9 % — AB (ref 39.0–52.0)
HEMOGLOBIN: 10 g/dL — AB (ref 13.0–17.0)
MCH: 29.9 pg (ref 26.0–34.0)
MCHC: 33.4 g/dL (ref 30.0–36.0)
MCV: 89.3 fL (ref 78.0–100.0)
Platelets: 171 10*3/uL (ref 150–400)
RBC: 3.35 MIL/uL — ABNORMAL LOW (ref 4.22–5.81)
RDW: 13.3 % (ref 11.5–15.5)
WBC: 10.2 10*3/uL (ref 4.0–10.5)

## 2016-11-02 LAB — ECHOCARDIOGRAM COMPLETE
Height: 68 in
Weight: 4657.6 oz

## 2016-11-02 LAB — TSH: TSH: 3.996 u[IU]/mL (ref 0.350–4.500)

## 2016-11-02 LAB — HEMOGLOBIN A1C
HEMOGLOBIN A1C: 6.4 % — AB (ref 4.8–5.6)
Mean Plasma Glucose: 137 mg/dL

## 2016-11-02 MED ORDER — AMLODIPINE BESYLATE 5 MG PO TABS
5.0000 mg | ORAL_TABLET | Freq: Every day | ORAL | Status: DC
  Administered 2016-11-02: 5 mg via ORAL
  Filled 2016-11-02 (×2): qty 1

## 2016-11-02 MED ORDER — ISOSORBIDE MONONITRATE ER 30 MG PO TB24: 30.0000 mg | ORAL_TABLET | Freq: Every day | ORAL | Status: DC

## 2016-11-02 MED ORDER — REGADENOSON 0.4 MG/5ML IV SOLN
INTRAVENOUS | Status: AC
  Filled 2016-11-02: qty 5

## 2016-11-02 MED ORDER — FUROSEMIDE 10 MG/ML IJ SOLN
80.0000 mg | Freq: Once | INTRAMUSCULAR | Status: AC
  Administered 2016-11-02: 80 mg via INTRAVENOUS
  Filled 2016-11-02: qty 8

## 2016-11-02 MED ORDER — AMINOPHYLLINE 25 MG/ML IV (NUC MED)
75.0000 mg | Freq: Once | INTRAVENOUS | Status: AC
  Administered 2016-11-02: 75 mg via INTRAVENOUS

## 2016-11-02 MED ORDER — ATORVASTATIN CALCIUM 20 MG PO TABS
20.0000 mg | ORAL_TABLET | Freq: Every day | ORAL | Status: DC
  Administered 2016-11-02: 20 mg via ORAL
  Filled 2016-11-02: qty 1

## 2016-11-02 MED ORDER — AMINOPHYLLINE 25 MG/ML IV SOLN
INTRAVENOUS | Status: AC
  Filled 2016-11-02: qty 10

## 2016-11-02 MED ORDER — IPRATROPIUM-ALBUTEROL 0.5-2.5 (3) MG/3ML IN SOLN
3.0000 mL | Freq: Four times a day (QID) | RESPIRATORY_TRACT | Status: DC
  Administered 2016-11-02 – 2016-11-03 (×4): 3 mL via RESPIRATORY_TRACT
  Filled 2016-11-02 (×5): qty 3

## 2016-11-02 MED ORDER — METOLAZONE 5 MG PO TABS
5.0000 mg | ORAL_TABLET | Freq: Once | ORAL | Status: AC
  Administered 2016-11-02: 5 mg via ORAL
  Filled 2016-11-02: qty 1

## 2016-11-02 MED ORDER — ISOSORBIDE MONONITRATE ER 60 MG PO TB24
60.0000 mg | ORAL_TABLET | Freq: Every day | ORAL | Status: DC
  Administered 2016-11-02 – 2016-11-03 (×2): 60 mg via ORAL
  Filled 2016-11-02 (×2): qty 1

## 2016-11-02 MED ORDER — REGADENOSON 0.4 MG/5ML IV SOLN
0.4000 mg | Freq: Once | INTRAVENOUS | Status: AC
  Administered 2016-11-02: 0.4 mg via INTRAVENOUS
  Filled 2016-11-02: qty 5

## 2016-11-02 MED ORDER — HYDRALAZINE HCL 50 MG PO TABS
50.0000 mg | ORAL_TABLET | Freq: Three times a day (TID) | ORAL | Status: DC
  Administered 2016-11-02 – 2016-11-03 (×4): 50 mg via ORAL
  Filled 2016-11-02 (×4): qty 1

## 2016-11-02 MED ORDER — TECHNETIUM TC 99M TETROFOSMIN IV KIT
30.0000 | PACK | Freq: Once | INTRAVENOUS | Status: AC | PRN
  Administered 2016-11-02: 30 via INTRAVENOUS

## 2016-11-02 NOTE — Progress Notes (Signed)
Back from [place], no complaints of any pain or discomfort, verbalized he is hungry.

## 2016-11-02 NOTE — Progress Notes (Signed)
  Echocardiogram 2D Echocardiogram has been performed.  Jennette Dubin 11/02/2016, 2:25 PM

## 2016-11-02 NOTE — Progress Notes (Signed)
Progress Note  Patient Name: Kent Osborne Date of Encounter: 11/02/2016  Primary Cardiologist: Dr [facility]  Subjective   Pt is feeling stronger with more energy but is having increased edema, shortness of breath and orthopnea. He had IV fluids going all night. He was unable to sleep well and his CPAP leaked all night.   Inpatient Medications    Scheduled Meds: . aspirin EC  81 mg Oral Daily  . calcitRIOL  0.25 mcg Oral Daily  . heparin  5,000 Units Subcutaneous Q8H  . hydrALAZINE  25 mg Oral TID  . insulin aspart  0-20 Units Subcutaneous TID WC  . insulin aspart  0-5 Units Subcutaneous QHS  . insulin glargine  45 Units Subcutaneous BID  . magnesium oxide  400 mg Oral Daily  . montelukast  10 mg Oral QHS  . prasugrel  10 mg Oral Daily  . simvastatin  40 mg Oral QHS   Continuous Infusions: . sodium chloride 100 mL/hr at 11/02/16 0332   PRN Meds: acetaminophen **OR** acetaminophen, benzonatate, guaiFENesin, hydrALAZINE, oxyCODONE   Vital Signs    Vitals:   11/01/16 2355 11/02/16 0026 11/02/16 0120 11/02/16 0618  BP: (!) 175/62 (!) 185/59 (!) 149/62 (!) 171/66  Pulse: 62   69  Resp: 20   20  Temp: 98.2 F (36.8 C)   98.4 F (36.9 C)  TempSrc: Oral   Oral  SpO2: 93%   96%  Weight:    291 lb 1.6 oz (132 kg)  Height:        Intake/Output Summary (Last 24 hours) at 11/02/16 0827 Last data filed at 11/02/16 9509  Gross per 24 hour  Intake             1780 ml  Output             1075 ml  Net              705 ml   Filed Weights   11/01/16 1021 11/01/16 1601 11/02/16 0618  Weight: 295 lb (133.8 kg) 286 lb 3.2 oz (129.8 kg) 291 lb 1.6 oz (132 kg)    Telemetry    1st degree AVB with rates in the 60's - Personally Reviewed  ECG    11/01/16- NSR, SB-59, 1st degree AVB, QTc-520  Physical Exam   GEN: Obese caucasian male. No acute distress.   Neck: No JVD Cardiac: RRR, no murmurs, rubs, or gallops.  Respiratory: Inspiratory and expiratory  wheezes, clear with cough. Rales in the bases GI: Soft, nontender, obese MS: 2+ lower leg edema; No deformity. Neuro:  Nonfocal  Psych: Normal affect   Labs    Chemistry Recent Labs Lab 10/30/16 1202 11/01/16 1032 11/02/16 0421  NA 130* 133* 135  K 4.1 4.7 3.9  CL 95* 95* 98*  CO2 23 22 25   GLUCOSE 97 264* 91  BUN 61* 90* 97*  CREATININE 4.44* 4.28* 4.23*  CALCIUM 8.0* 8.4* 8.0*  PROT 6.7  --   --   ALBUMIN 3.3*  --   --   AST 43*  --   --   ALT 19  --   --   ALKPHOS 44  --   --   BILITOT 0.9  --   --   GFRNONAA 13* 13* 14*  GFRAA 15* 16* 16*  ANIONGAP 12 16* 12     Hematology Recent Labs Lab 10/30/16 1202 10/30/16 1208 11/01/16 1032 11/02/16 0421  WBC 12.5*  --  10.5  10.2  RBC 2.16* 2.57* 3.60* 3.35*  HGB 6.6*  --  10.7* 10.0*  HCT 19.8*  --  32.2* 29.9*  MCV 91.7  --  89.4 89.3  MCH 30.6  --  29.7 29.9  MCHC 33.3  --  33.2 33.4  RDW 14.0  --  13.3 13.3  PLT 165  --  164 171    Cardiac Enzymes Recent Labs Lab 10/30/16 1202 11/01/16 1800 11/01/16 2220 11/02/16 0421  TROPONINI 6.59* 5.12* 5.22* 4.73*    Recent Labs Lab 11/01/16 1104  TROPIPOC 4.64*     BNP Recent Labs Lab 10/30/16 1202  BNP 384.3*     DDimer No results for input(s): DDIMER in the last 168 hours.   Radiology    Dg Chest 2 View  Result Date: 11/01/2016 CLINICAL DATA:  Cough for 2 weeks.  Fever over 100 degrees. EXAM: CHEST  2 VIEW COMPARISON:  10/30/2016 FINDINGS: There is mild bilateral interstitial prominence. There is no focal parenchymal opacity. There is no pleural effusion or pneumothorax. There is stable cardiomegaly. The osseous structures are unremarkable. IMPRESSION: Cardiomegaly with mild pulmonary vascular congestion. Electronically Signed   By: Kathreen Devoid   On: 11/01/2016 11:56   US Renal  Result Date: 11/02/2016 CLINICAL DATA:  Renal failure. EXAM: RENAL / URINARY TRACT ULTRASOUND COMPLETE COMPARISON:  CT 05/20/2016 . FINDINGS: Right Kidney: Length: 11.8  cm. Cortical thinning . Echogenicity within normal limits. No mass or hydronephrosis visualized. Left Kidney: Length: 12.3 cm. Cortical thinning. Echogenicity within normal limits. No hydronephrosis visualized. 4.0 cm simple cyst left lower renal pole Bladder: Appears normal for degree of bladder distention. Limited exam due to patient body habitus. IMPRESSION: 1. Bilateral renal cortical thinning. No acute renal abnormality identified. No hydronephrosis. 4 cm simple cyst left kidney. 2. No evidence of bladder distention. Electronically Signed   By: Marcello Moores  Register   On: 11/02/2016 07:14   Dg Chest Port 1 View  Result Date: 11/02/2016 CLINICAL DATA:  Short of breath and weakness EXAM: PORTABLE CHEST 1 VIEW COMPARISON:  Yesterday FINDINGS: Moderate cardiomegaly. Vascular congestion improved. Low volumes. Bibasilar atelectasis. No pneumothorax. IMPRESSION: Improved vascular congestion. Electronically Signed   By: Marybelle Killings M.D.   On: 11/02/2016 07:34    Cardiac Studies   Echo done pending results  Patient Profile     66 y.o. male from [place] with past medical history of CAD s/p LAD (X2) and circ stentlin in 2009 in [place], stage 4 CKD, IDDM, HTN, HLD, OSA-CPAP and asthmatic COPD. He presented to [facility] ED with weakness on 10/30/16- Hgb 6.6 (stool negative guaiac) and was transfused. He then left AMA but his daughter had him return on 3/14. His troponins are elevated.  Assessment & Plan    NSTEMI- -Suspect the pt had demand ischemia NSTEMI around 10/30/16. He denies chest pain and his Troponin is coming down. 6.59-->5.12-->5.22-->4.73. Dyspnea thought to be related to severe anemia. -Increased dyspnea and orthopnea through the night. Pt had IV fluids running at 100cc/hr. His home diuretics were on hold due to renal function. He has mild increase in LE edema and crackle in the lung bases and wheezes. IV fluids stopped this morning.  -He is in the process of a 2 day lexiscan myoview study.    CAD- -S/P PCI with stents to LAD (x2) and CFX in 2009-[facility].  -Continue aspirin and Effient  Anemia-  -Hgb 6.6 on 10/30/16- probably secondary to chronic renal disease as he was heme negative. He has  improved symptomatically after transfusion. Hgb is 10. 7 (3/14), 10 (3/15) -Pt is feeling stronger today  CRI- -Stage 4. He has been followed by Dr Marlowe Sax, nephrology in [place]. He does not have HD access yet. He is not a candidate for cath secondary to CRI.  -Home torsemide 20 mg bid and metolazone 2.5 mg prn are on hold. IM MD is going to contact Dr. Marlowe Sax for input.   HTN- -Home meds include labetalol 600 mg am, 300 mg noon, 600 mg pm.   HLD- -Continue home statin  COPD -Continue albuterol nebs prn -CPAP at hs  IDDM- -Per primary team  Obesity and sleep apnea-  -On C-pap and O2 at night  Signed, Daune Perch, NP  11/02/2016, 8:27 AM    Some volume overload with hydration stopped saline. Would have nephrology see here in hospital. ? Aransap Will increase hydralazine for BP. Cardiac status stable can stop effient if needed for EGD or anemia w/u As stents are old. Not a cath candidate due to anemia and A/CRF.  Exam with obesity basilar crackles and plus One edema  Jenkins Rouge

## 2016-11-02 NOTE — Progress Notes (Signed)
nuc is abnormal but unable to cath, I added Imdur per Dr. Johnsie Cancel at 60 mg, but with HTN will keep tonight to control BP.

## 2016-11-02 NOTE — Progress Notes (Signed)
Inpatient Diabetes Program Recommendations  AACE/ADA: New Consensus Statement on Inpatient Glycemic Control (2015)  Target Ranges:  Prepandial:   less than 140 mg/dL      Peak postprandial:   less than 180 mg/dL (1-2 hours)      Critically ill patients:  140 - 180 mg/dL  Results for Kent Osborne, Kent Osborne (MRN [id number]) as of 11/02/2016 10:00  Ref. Range 11/01/2016 16:21 11/01/2016 21:23 11/02/2016 07:30  Glucose-Capillary Latest Ref Range: 65 - 99 mg/dL 142 (H) 112 (H) 70    Results for Kent Osborne, Kent Osborne (MRN [id number]) as of 11/02/2016 10:00  Ref. Range 11/01/2016 18:00  Hemoglobin A1C Latest Ref Range: 4.8 - 5.6 % 6.4 (H)    Review of Glycemic Control  Diabetes history: DM2 Outpatient Diabetes medications: Lantus 45 units BID, Novolog 21-25 units TID with meals, Glipizide 10 mg QPM Current orders for Inpatient glycemic control: Lantus 45 units BID, Novolog 0-20 units TID with meals, Novolog 0-5 units QHS  Inpatient Diabetes Program Recommendations: Insulin - Basal: Fasting glucose 70 mg/dl this morning. Per home medication list, patient took Lantus 45 units at home on 3/14 and patient received Lantus 45 units last night. May want to consider decreasing Lantus to 40 units BID.  Thanks, Barnie Alderman, RN, MSN, CDE Diabetes Coordinator Inpatient Diabetes Program [phone] (Team Pager from 8am to 5pm)

## 2016-11-02 NOTE — Progress Notes (Signed)
Called received from [place] patient had trigeminy and now is having bigeminy .  V/s checked  O2Sats  90% on RA,placed on 2Lnc O2Sats 96%.Patient no complaints of any pain or discomfort at this time. MD paged made aware.

## 2016-11-02 NOTE — Progress Notes (Signed)
Second day of nuc, with lexiscan completed, without complication, nuc results to follow.

## 2016-11-02 NOTE — Progress Notes (Signed)
CRITICAL VALUE ALERT  Critical value received:  Troponin 5.12  Date of notification:  yesterday   Time of notification:  1946 Critical value read back:Yes.    Nurse who received alert:  Tristan Schroeder   MD notified (1st page):  oncall triad hospitalist  Time of first page: 1957   MD notified (2nd page):  Time of second page:  Responding MD:  Baltazar Najjar, NP  Time MD responded:

## 2016-11-02 NOTE — Progress Notes (Signed)
Labetalol discontinued due to patients bradycardia.

## 2016-11-02 NOTE — Progress Notes (Signed)
[facility]                                                                              Patient Demographics  Kent Osborne, is a 66 y.o. male, DOB - 1950-09-04, WCB:[id number]  Admit date - 11/01/2016   Admitting Physician Waldemar Dickens, MD  Outpatient Primary MD for the patient is Betty Martinique, MD  Outpatient specialists:   LOS - 1  days    Chief Complaint  Patient presents with  . Weakness  . Shortness of Breath       Brief summary   Kent Osborne is a 66 y.o. male with medical history significant of CAD, DM, HLD, HTN, Nephrolithiasis, presented for second time to ED complaining of generalized weakness and elevated trop. Complaining of shortness of breath and generalized weakness. Seen at [facility] emergency room 2 days ago and found to be anemic with hemoglobin of 8 and an elevated troponin around 6. Patient received 1 unit PRBC and was scheduled to be admitted to [facility] however patient deferred further treatment and left AMA. Symptoms have continued since that time. Patient states the symptoms continue to get worse.  Troponin was 5.1 at the time of admission, patient was admitted for NSTEMI, creatinine 4.2,  Assessment & Plan    Principal Problem:   NSTEMI (non-ST elevated myocardial infarction) (HCC)With underlying history of CAD - Currently denies any chest pain, troponin on 3/12 was 6.59, trended down to 5.1 on admission and 5.8 this am  - Also at the same time noted on 3/12 with a hemoglobin of 6.6, patient denied any hematochezia, melena or hematemesis, had left AMA from ED - Yesterday underwent 2-D echo, results pending - Undergoing 2-D stress test - This morning, complaining of more congestion, orthopnea, pedal edema, patient is on Demadex 20 mg twice a day with metolazone 2.5 mg as needed which were apparently held on admission due to worsening renal function and also received blood transfusion - I discontinued IV fluids  and gave Lasix 80 mg IV 1 with metolazone 5 mg PO x1 after d/w Dr Posey Pronto, renal consult    Active Problems:   Acute renal failure superimposed on chronic kidney disease (Parkin) with anemia, volume overload - Per patient, has been noncompliant with meds and dietary indiscretion, denies any NSAID use or new medications. Still makes urine, no complaints of oliguria or anuria. - Discontinued IV fluids, gave Lasix 80 mg IV 1 with metolazone 5 mg PO x1 after d/w Dr Posey Pronto, renal consult called - Per patient, baseline GFR is around 25, follows Dr. Marlowe Sax in [facility], has talked about dialysis in future but no plans yet - Renal ultrasound 3/15 showed bilateral renal cortical thinning, no hydronephrosis  Anemia: Likely anemia of chronic disease - FOBT negative, Received 1 unit of packed RBC transfusion - May need to be started on ESA    Essential hypertension - Uncontrolled, continue hydralazine, added Norvasc 5 mg daily - will restart torsemide in a.m.    Diabetes mellitus with complication (HCC) - Continue sliding scale insulin, Lantus     COPD (chronic obstructive pulmonary disease) (HCC) -  Currently wheezing, placed on DuoNeb's     OSA (obstructive sleep apnea) On CPAP  Hyperlipidemia - Continue statin    Code Status: Full code DVT Prophylaxis:   heparin  Family Communication: Discussed in detail with the patient, all imaging results, lab results explained to the patient   Disposition Plan:   Time Spent in minutes   25 minutes  Procedures:  Nuclear medicine stress test   Consultants:   Cardiology   nephrology  Antimicrobials:      Medications  Scheduled Meds: . aminophylline      . aspirin EC  81 mg Oral Daily  . calcitRIOL  0.25 mcg Oral Daily  . furosemide  80 mg Intravenous Once  . heparin  5,000 Units Subcutaneous Q8H  . hydrALAZINE  50 mg Oral TID  . insulin aspart  0-20 Units Subcutaneous TID WC  . insulin aspart  0-5 Units Subcutaneous QHS  .  insulin glargine  45 Units Subcutaneous BID  . ipratropium-albuterol  3 mL Nebulization Q6H  . magnesium oxide  400 mg Oral Daily  . metolazone  5 mg Oral Once  . montelukast  10 mg Oral QHS  . prasugrel  10 mg Oral Daily  . regadenoson      . simvastatin  40 mg Oral QHS   Continuous Infusions: PRN Meds:.acetaminophen **OR** acetaminophen, benzonatate, guaiFENesin, hydrALAZINE, oxyCODONE   Antibiotics   Anti-infectives    None        Subjective:   Kent Osborne was seen and examined today. Complaining of wheezing, shortness of breath this morning at the time of my examination. No chest pain.  Patient denies dizziness, abdominal pain, N/V/D/C, new weakness, numbess, tingling. No acute events overnight.    Objective:   Vitals:   11/02/16 1026 11/02/16 1028 11/02/16 1030 11/02/16 1032  BP: (!) 196/86 (!) 195/75 (!) 189/87 (!) 195/85  Pulse: 93 77 90 90  Resp:      Temp:      TempSrc:      SpO2:      Weight:      Height:        Intake/Output Summary (Last 24 hours) at 11/02/16 1111 Last data filed at 11/02/16 0728  Gross per 24 hour  Intake             1780 ml  Output             1075 ml  Net              705 ml     Wt Readings from Last 3 Encounters:  11/02/16 132 kg (291 lb 1.6 oz)  10/30/16 133.8 kg (295 lb)  10/30/16 133.9 kg (295 lb 2 oz)     Exam  General: Alert and oriented x 3, NAD  HEENT:    Neck: Supple, no JVD, no masses  Cardiovascular: S1 S2 auscultated, no rubs, murmurs or gallops. Regular rate and rhythm.  Respiratory: Expiratory wheezing with bibasilar crackles   Gastrointestinal: Soft, nontender, nondistended, + bowel sounds  Ext: no cyanosis clubbing, 1-2+ edema  Neuro: AAOx3, Cr N's II- XII. Strength 5/5 upper and lower extremities bilaterally  Skin: No rashes  Psych: Normal affect and demeanor, alert and oriented x3    Data Reviewed:  I have personally reviewed following labs and imaging studies  Micro Results No  results found for this or any previous visit (from the past 240 hour(s)).  Radiology Reports Dg Chest 2 View  Result Date: 11/01/2016 CLINICAL  DATA:  Cough for 2 weeks.  Fever over 100 degrees. EXAM: CHEST  2 VIEW COMPARISON:  10/30/2016 FINDINGS: There is mild bilateral interstitial prominence. There is no focal parenchymal opacity. There is no pleural effusion or pneumothorax. There is stable cardiomegaly. The osseous structures are unremarkable. IMPRESSION: Cardiomegaly with mild pulmonary vascular congestion. Electronically Signed   By: Kathreen Devoid   On: 11/01/2016 11:56   Dg Chest 2 View  Result Date: 10/30/2016 CLINICAL DATA:  Dyspnea EXAM: CHEST  2 VIEW COMPARISON:  05/28/2016 chest radiograph. FINDINGS: Stable cardiomediastinal silhouette with mild cardiomegaly and aortic atherosclerosis. No pneumothorax. No pleural effusion. Mild pulmonary edema. IMPRESSION: Mild congestive heart failure. Aortic atherosclerosis. Electronically Signed   By: Ilona Sorrel M.D.   On: 10/30/2016 12:23   US Renal  Result Date: 11/02/2016 CLINICAL DATA:  Renal failure. EXAM: RENAL / URINARY TRACT ULTRASOUND COMPLETE COMPARISON:  CT 05/20/2016 . FINDINGS: Right Kidney: Length: 11.8 cm. Cortical thinning . Echogenicity within normal limits. No mass or hydronephrosis visualized. Left Kidney: Length: 12.3 cm. Cortical thinning. Echogenicity within normal limits. No hydronephrosis visualized. 4.0 cm simple cyst left lower renal pole Bladder: Appears normal for degree of bladder distention. Limited exam due to patient body habitus. IMPRESSION: 1. Bilateral renal cortical thinning. No acute renal abnormality identified. No hydronephrosis. 4 cm simple cyst left kidney. 2. No evidence of bladder distention. Electronically Signed   By: Marcello Moores  Register   On: 11/02/2016 07:14   Dg Chest Port 1 View  Result Date: 11/02/2016 CLINICAL DATA:  Short of breath and weakness EXAM: PORTABLE CHEST 1 VIEW COMPARISON:  Yesterday  FINDINGS: Moderate cardiomegaly. Vascular congestion improved. Low volumes. Bibasilar atelectasis. No pneumothorax. IMPRESSION: Improved vascular congestion. Electronically Signed   By: Marybelle Killings M.D.   On: 11/02/2016 07:34    Lab Data:  CBC:  Recent Labs Lab 10/30/16 1202 11/01/16 1032 11/02/16 0421  WBC 12.5* 10.5 10.2  NEUTROABS 8.9*  --   --   HGB 6.6* 10.7* 10.0*  HCT 19.8* 32.2* 29.9*  MCV 91.7 89.4 89.3  PLT 165 164 350   Basic Metabolic Panel:  Recent Labs Lab 10/30/16 1202 11/01/16 1032 11/02/16 0421  NA 130* 133* 135  K 4.1 4.7 3.9  CL 95* 95* 98*  CO2 23 22 25   GLUCOSE 97 264* 91  BUN 61* 90* 97*  CREATININE 4.44* 4.28* 4.23*  CALCIUM 8.0* 8.4* 8.0*   GFR: Estimated Creatinine Clearance: 23.7 mL/min (A) (by C-G formula based on SCr of 4.23 mg/dL (H)). Liver Function Tests:  Recent Labs Lab 10/30/16 1202  AST 43*  ALT 19  ALKPHOS 44  BILITOT 0.9  PROT 6.7  ALBUMIN 3.3*   No results for input(s): LIPASE, AMYLASE in the last 168 hours. No results for input(s): AMMONIA in the last 168 hours. Coagulation Profile:  Recent Labs Lab 11/01/16 1032  INR 1.26   Cardiac Enzymes:  Recent Labs Lab 10/30/16 1202 11/01/16 1800 11/01/16 2220 11/02/16 0421 11/02/16 0748  TROPONINI 6.59* 5.12* 5.22* 4.73* 5.87*   BNP (last 3 results) No results for input(s): PROBNP in the last 8760 hours. HbA1C:  Recent Labs  10/30/16 1217 11/01/16 1800  HGBA1C 7.0* 6.4*   CBG:  Recent Labs Lab 11/01/16 1621 11/01/16 2123 11/02/16 0730  GLUCAP 142* 112* 70   Lipid Profile:  Recent Labs  10/30/16 1217  CHOL 104  HDL 24.90*  LDLCALC 45  TRIG 170.0*  CHOLHDL 4   Thyroid Function Tests:  Recent Labs  11/02/16  0421  TSH 3.996   Anemia Panel:  Recent Labs  10/30/16 1208  RETICCTPCT 1.2   Urine analysis:    Component Value Date/Time   COLORURINE yellow 07/27/2010 0846   APPEARANCEUR Clear 07/27/2010 0846   LABSPEC 1.020  07/27/2010 0846   PHURINE 5.0 07/27/2010 0846   HGBUR 3+ 07/27/2010 0846   BILIRUBINUR n 11/02/2015 1240   PROTEINUR 1+ 11/02/2015 1240   UROBILINOGEN 0.2 11/02/2015 1240   UROBILINOGEN 0.2 07/27/2010 0846   NITRITE n 11/02/2015 1240   NITRITE negative 07/27/2010 0846   LEUKOCYTESUR Negative 11/02/2015 1240     Cale Bethard M.D. [facility] 11/02/2016, 11:11 AM  Pager: [phone] Between 7am to 7pm - call Pager - [phone]  After 7pm go to [email or web address] - password [email or web address]  Call night coverage person covering after 7pm

## 2016-11-02 NOTE — Progress Notes (Signed)
RT made 2nd attempt to place patient on CPAP and patient states he is still not ready. Patient states he will have RN contact RT when he is ready.

## 2016-11-02 NOTE — Consult Note (Signed)
Reason for Consult: Acute kidney injury and chronic kidney disease stage IV, volume overload Referring Physician: Estill Cotta M.D. ([facility])  HPI:  66 year old Caucasian man with past medical history significant for hypertension, CAD status post PCI/stents in the past, gout, obesity/OSA, type 2 diabetes mellitus and chronic kidney disease stage IV at baseline (reports GFR of around 25 mL/minute) who follows up with Dr. Marlowe Sax of nephrology in [place], [place].  Presented to the emergency room yesterday 2 days after leaving the [facility] emergency room AMA where he was found to be anemic with an elevated troponin but left after one unit PRBC transfusion. He presents with continued shortness of breath and generalized weakness and was found to have an elevated troponin and elevated creatinine. He denies any hematochezia, melena, hematuria or epistaxis. He denies any nausea, vomiting, diarrhea or dyskinesia. He denies any cough or sputum production but reports sinus pressure/congestion. He denies the use of any nonsteroidal anti-inflammatory drugs. He denies any obstructive or irritative urinary symptoms including flank pain, fever or chills. He denies chest pain.  Concern is raised regarding his elevated creatinine which was 4.4 on 10/30/16, 4.3 on 11/01/16 and today is 4.2 (after holding diuretics and gentle intravenous fluids).  Past Medical History:  Diagnosis Date  . Acute renal failure superimposed on chronic kidney disease ([place])    Kent Osborne 11/01/2016  . Anemia   . Arthritis    "back, knees" (11/01/2016)  . Asthma   . CAD (coronary artery disease) 12/31/07   [facility] stents- placed in the circumflex and LAD  . Chronic bronchitis ([place])   . History of blood transfusion 10/30/2016   "related to anemia"  . History of gout   . History of kidney stones   . Hyperlipidemia   . Hypertension   . NSTEMI (non-ST elevated myocardial infarction) ([place]) 10/30/2016  . OA (osteoarthritis)   . Obese   .  On home oxygen therapy    "2L w/CPAP" (11/01/2016)  . OSA on CPAP   . Psoriatic arthritis ([place])   . Type II diabetes mellitus ([place])     Past Surgical History:  Procedure Laterality Date  . CORONARY ANGIOPLASTY WITH STENT PLACEMENT  2009   "3 stents"    Family History  Problem Relation Age of Onset  . Diabetes Other   . Hyperlipidemia Other   . Hypertension Other     Social History:  reports that he has never smoked. He has never used smokeless tobacco. He reports that he drinks alcohol. He reports that he does not use drugs.  Allergies: No Known Allergies  Medications:  Scheduled: . aminophylline      . amLODipine  5 mg Oral Daily  . aspirin EC  81 mg Oral Daily  . calcitRIOL  0.25 mcg Oral Daily  . heparin  5,000 Units Subcutaneous Q8H  . hydrALAZINE  50 mg Oral TID  . insulin aspart  0-20 Units Subcutaneous TID WC  . insulin aspart  0-5 Units Subcutaneous QHS  . insulin glargine  45 Units Subcutaneous BID  . ipratropium-albuterol  3 mL Nebulization Q6H  . magnesium oxide  400 mg Oral Daily  . montelukast  10 mg Oral QHS  . prasugrel  10 mg Oral Daily  . regadenoson      . simvastatin  40 mg Oral QHS    BMP Latest Ref Rng & Units 11/02/2016 11/01/2016 10/30/2016  Glucose 65 - 99 mg/dL 91 264(H) 97  BUN 6 - 20 mg/dL 97(H) 90(H) 61(H)  Creatinine 0.61 - 1.24 mg/dL 4.23(H) 4.28(H) 4.44(H)  Sodium 135 - 145 mmol/L 135 133(L) 130(L)  Potassium 3.5 - 5.1 mmol/L 3.9 4.7 4.1  Chloride 101 - 111 mmol/L 98(L) 95(L) 95(L)  CO2 22 - 32 mmol/L 25 22 23   Calcium 8.9 - 10.3 mg/dL 8.0(L) 8.4(L) 8.0(L)   CBC Latest Ref Rng & Units 11/02/2016 11/01/2016 10/30/2016  WBC 4.0 - 10.5 K/uL 10.2 10.5 12.5(H)  Hemoglobin 13.0 - 17.0 g/dL 10.0(L) 10.7(L) 6.6(LL)  Hematocrit 39.0 - 52.0 % 29.9(L) 32.2(L) 19.8(L)  Platelets 150 - 400 K/uL 171 164 165     Dg Chest 2 View  Result Date: 11/01/2016 CLINICAL DATA:  Cough for 2 weeks.  Fever over 100 degrees. EXAM: CHEST  2 VIEW COMPARISON:   10/30/2016 FINDINGS: There is mild bilateral interstitial prominence. There is no focal parenchymal opacity. There is no pleural effusion or pneumothorax. There is stable cardiomegaly. The osseous structures are unremarkable. IMPRESSION: Cardiomegaly with mild pulmonary vascular congestion. Electronically Signed   By: Kathreen Devoid   On: 11/01/2016 11:56   US Renal  Result Date: 11/02/2016 CLINICAL DATA:  Renal failure. EXAM: RENAL / URINARY TRACT ULTRASOUND COMPLETE COMPARISON:  CT 05/20/2016 . FINDINGS: Right Kidney: Length: 11.8 cm. Cortical thinning . Echogenicity within normal limits. No mass or hydronephrosis visualized. Left Kidney: Length: 12.3 cm. Cortical thinning. Echogenicity within normal limits. No hydronephrosis visualized. 4.0 cm simple cyst left lower renal pole Bladder: Appears normal for degree of bladder distention. Limited exam due to patient body habitus. IMPRESSION: 1. Bilateral renal cortical thinning. No acute renal abnormality identified. No hydronephrosis. 4 cm simple cyst left kidney. 2. No evidence of bladder distention. Electronically Signed   By: Marcello Moores  Register   On: 11/02/2016 07:14   Dg Chest Port 1 View  Result Date: 11/02/2016 CLINICAL DATA:  Short of breath and weakness EXAM: PORTABLE CHEST 1 VIEW COMPARISON:  Yesterday FINDINGS: Moderate cardiomegaly. Vascular congestion improved. Low volumes. Bibasilar atelectasis. No pneumothorax. IMPRESSION: Improved vascular congestion. Electronically Signed   By: Marybelle Killings M.D.   On: 11/02/2016 07:34    Review of Systems  Constitutional: Positive for malaise/fatigue. Negative for chills and fever.  HENT: Positive for congestion and sinus pain. Negative for ear pain, hearing loss and sore throat.   Eyes: Negative.   Respiratory: Positive for shortness of breath. Negative for cough and sputum production.   Cardiovascular: Positive for leg swelling. Negative for chest pain and orthopnea.  Gastrointestinal: Positive for  heartburn. Negative for abdominal pain, diarrhea, nausea and vomiting.  Genitourinary: Negative.   Musculoskeletal: Negative.   Skin: Negative.   Neurological: Positive for weakness. Negative for focal weakness and headaches.   Blood pressure (!) 180/68, pulse 91, temperature 99.9 F (37.7 C), temperature source Oral, resp. rate 16, height 5\' 8"  (1.727 m), weight 132 kg (291 lb 1.6 oz), SpO2 95 %. Physical Exam  Nursing note and vitals reviewed. Constitutional: He is oriented to person, place, and time. He appears well-developed and well-nourished. No distress.  HENT:  Head: Normocephalic and atraumatic.  Left Ear: External ear normal.  Nose: Nose normal.  Eyes: EOM are normal. Pupils are equal, round, and reactive to light. No scleral icterus.  Neck: Normal range of motion. Neck supple. JVD present. No thyromegaly present.  Cardiovascular: Normal rate, regular rhythm and normal heart sounds.   No murmur heard. Respiratory: Effort normal. He has rales.  Fine rales bilaterally with poor inspiratory effort and diminished breath sounds over bases  GI: Soft.  Bowel sounds are normal. There is no tenderness.  Musculoskeletal: Normal range of motion. He exhibits edema.  2+ lower extremity edema with venous stasis changes  Neurological: He is alert and oriented to person, place, and time.  Skin: Skin is warm and dry. No erythema.    Assessment/Plan: 1. Acute kidney injury on chronic kidney disease stage IV: based on history, timeline of events and available data base likely to be hemodynamically mediated with significant anemia and possibly renal hypoperfusion/ischemic injury. Agree with discontinuation of intravenous fluids and resumption of oral diuretics after IV diuretic bolus to help him get closer to euvolemic status. No plan for coronary angiography at this time given absence of chest pain and downtrending troponin levels. Discussed with the patient regarding compliance with ongoing  management/low-sodium diet and encouraged him to keep his appointment with outpatient cardiology/nephrology. He does not have any compelling indications for dialysis at this time including any critical electrolytes or uremic signs or symptoms and if he does not have significant dyspnea or hypoxemia with ambulation, agreeable with plans for discharge later today or tomorrow. 2. Non-ST elevation MI: Likely from demand ischemia of significant anemia-plans noted by cardiology for conservative management at this time with ongoing outpatient follow-up after Myoview today. 3. Anemia: Denies any overt loss and was Hemoccult negative upon initial testing. Status post PRBC with good improvement of hemoglobin. Recommend follow-up with nephrology as he likely needs management of anemia of chronic kidney disease with ESA/iron studies 4. Hypertension: Restart diuretic therapy and usual antihypertensive medications. Anticipate to improve his volume status improves.    Filomeno Cromley K. 11/02/2016, 12:20 PM

## 2016-11-03 LAB — CBC
HCT: 30.8 % — ABNORMAL LOW (ref 39.0–52.0)
Hemoglobin: 10 g/dL — ABNORMAL LOW (ref 13.0–17.0)
MCH: 29.2 pg (ref 26.0–34.0)
MCHC: 32.5 g/dL (ref 30.0–36.0)
MCV: 89.8 fL (ref 78.0–100.0)
PLATELETS: 175 10*3/uL (ref 150–400)
RBC: 3.43 MIL/uL — ABNORMAL LOW (ref 4.22–5.81)
RDW: 13.5 % (ref 11.5–15.5)
WBC: 6.4 10*3/uL (ref 4.0–10.5)

## 2016-11-03 LAB — MAGNESIUM: Magnesium: 2 mg/dL (ref 1.7–2.4)

## 2016-11-03 LAB — BASIC METABOLIC PANEL
Anion gap: 14 (ref 5–15)
BUN: 96 mg/dL — AB (ref 6–20)
CALCIUM: 8.2 mg/dL — AB (ref 8.9–10.3)
CO2: 27 mmol/L (ref 22–32)
CREATININE: 3.63 mg/dL — AB (ref 0.61–1.24)
Chloride: 93 mmol/L — ABNORMAL LOW (ref 101–111)
GFR calc Af Amer: 19 mL/min — ABNORMAL LOW (ref >60)
GFR calc non Af Amer: 16 mL/min — ABNORMAL LOW (ref >60)
GLUCOSE: 104 mg/dL — AB (ref 65–99)
Potassium: 3.5 mmol/L (ref 3.5–5.1)
Sodium: 134 mmol/L — ABNORMAL LOW (ref 135–145)

## 2016-11-03 LAB — PROCALCITONIN: PROCALCITONIN: 0.56 ng/mL

## 2016-11-03 LAB — GLUCOSE, CAPILLARY: Glucose-Capillary: 95 mg/dL (ref 65–99)

## 2016-11-03 MED ORDER — HYDRALAZINE HCL 50 MG PO TABS: 50.0000 mg | ORAL_TABLET | Freq: Three times a day (TID) | ORAL | 3 refills | Status: DC

## 2016-11-03 MED ORDER — COLCHICINE 0.6 MG PO TABS: 0.6000 mg | ORAL_TABLET | Freq: Every day | ORAL | Status: DC | PRN

## 2016-11-03 MED ORDER — POTASSIUM CHLORIDE CRYS ER 20 MEQ PO TBCR
40.0000 meq | EXTENDED_RELEASE_TABLET | Freq: Once | ORAL | Status: AC
  Administered 2016-11-03: 40 meq via ORAL
  Filled 2016-11-03: qty 2

## 2016-11-03 MED ORDER — ISOSORBIDE MONONITRATE ER 60 MG PO TB24: 60.0000 mg | ORAL_TABLET | Freq: Every day | ORAL | 4 refills | Status: DC

## 2016-11-03 MED ORDER — NITROGLYCERIN 0.4 MG SL SUBL: 0.4000 mg | SUBLINGUAL_TABLET | SUBLINGUAL | Status: DC | PRN

## 2016-11-03 MED ORDER — GUAIFENESIN ER 600 MG PO TB12: 600.0000 mg | ORAL_TABLET | Freq: Two times a day (BID) | ORAL | 0 refills | Status: DC | PRN

## 2016-11-03 MED ORDER — INSULIN ASPART 100 UNIT/ML FLEXPEN: 21.0000 [IU] | PEN_INJECTOR | Freq: Three times a day (TID) | SUBCUTANEOUS | Status: DC

## 2016-11-03 MED ORDER — ALLOPURINOL 300 MG PO TABS: 150.0000 mg | ORAL_TABLET | Freq: Every day | ORAL | Status: DC

## 2016-11-03 MED ORDER — NITROGLYCERIN 0.4 MG SL SUBL: 0.4000 mg | SUBLINGUAL_TABLET | SUBLINGUAL | 12 refills | Status: DC | PRN

## 2016-11-03 MED ORDER — TORSEMIDE 20 MG PO TABS
20.0000 mg | ORAL_TABLET | Freq: Two times a day (BID) | ORAL | Status: DC
  Administered 2016-11-03: 20 mg via ORAL
  Filled 2016-11-03: qty 1

## 2016-11-03 MED ORDER — GLIPIZIDE 10 MG PO TABS: 10.0000 mg | ORAL_TABLET | Freq: Every evening | ORAL | Status: DC

## 2016-11-03 MED ORDER — BENZONATATE 200 MG PO CAPS: 200.0000 mg | ORAL_CAPSULE | Freq: Three times a day (TID) | ORAL | 0 refills | Status: DC | PRN

## 2016-11-03 MED ORDER — SIMVASTATIN 40 MG PO TABS: 40.0000 mg | ORAL_TABLET | Freq: Every day | ORAL | 3 refills | Status: DC

## 2016-11-03 MED ORDER — CARVEDILOL 6.25 MG PO TABS: 6.2500 mg | ORAL_TABLET | Freq: Two times a day (BID) | ORAL | Status: DC

## 2016-11-03 MED ORDER — INSULIN GLARGINE 100 UNIT/ML SOLOSTAR PEN: 45.0000 [IU] | PEN_INJECTOR | Freq: Two times a day (BID) | SUBCUTANEOUS | Status: DC

## 2016-11-03 MED ORDER — CARVEDILOL 6.25 MG PO TABS: 6.2500 mg | ORAL_TABLET | Freq: Two times a day (BID) | ORAL | 3 refills | Status: DC

## 2016-11-03 NOTE — Progress Notes (Signed)
Patient ID: Kent Osborne, male   DOB: 10-06-1950, 66 y.o.   MRN: [id number]  [facility] Progress Note   Assessment/ Plan:   1. Acute kidney injury on chronic kidney disease stage IV: Suspected to be hemodynamically mediated acute kidney injury. Improvement of creatinine with fair urine output noted after resumption of diuretic therapy yesterday. No acute electrolyte abnormality or uremic signs or symptoms to prompt intervention with dialysis at this point. Encouraged close follow-up with his outpatient nephrologist and anticipate discussions regarding renal replacement therapy will be started soon. 2. Non-ST elevation MI: Likely from demand ischemia of significant anemia-plans noted by cardiology for conservative management at this time with ongoing outpatient follow-up with his cardiologist in [place]. 3. Anemia: Denies any overt loss and was Hemoccult negative upon initial testing. Status post PRBC with good improvement of hemoglobin. Recommend follow-up with nephrology as he likely needs management of anemia of chronic kidney disease with ESA/iron studies 4. Hypertension: Slightly improved following resumption of antihypertensive therapy/diuretics and discontinuation of IV fluids.  Subjective:   Reports to be feeling better and had a relatively good night with minimal shortness of breath.    Objective:   BP (!) 151/71 (BP Location: Right Arm)   Pulse 73   Temp 97.8 F (36.6 C) (Oral)   Resp 16   Ht 5\' 8"  (1.727 m)   Wt 130.3 kg (287 lb 3.2 oz) Comment: scale a  SpO2 98%   BMI 43.67 kg/m   Intake/Output Summary (Last 24 hours) at 11/03/16 0825 Last data filed at 11/03/16 0600  Gross per 24 hour  Intake             1160 ml  Output             1125 ml  Net               35 ml   Weight change: -3.538 kg (-7 lb 12.8 oz)  Physical Exam: Gen: Comfortably sitting up in recliner, watching television CVS: Pulse regular rhythm, normal rate Resp: Fine rales left  base with diminished breath sounds right side Abd: Soft, obese, nontender Ext: 1-2+ lower extremity edema with venous stasis changes  Imaging: Dg Chest 2 View  Result Date: 11/01/2016 CLINICAL DATA:  Cough for 2 weeks.  Fever over 100 degrees. EXAM: CHEST  2 VIEW COMPARISON:  10/30/2016 FINDINGS: There is mild bilateral interstitial prominence. There is no focal parenchymal opacity. There is no pleural effusion or pneumothorax. There is stable cardiomegaly. The osseous structures are unremarkable. IMPRESSION: Cardiomegaly with mild pulmonary vascular congestion. Electronically Signed   By: Kathreen Devoid   On: 11/01/2016 11:56   US Renal  Result Date: 11/02/2016 CLINICAL DATA:  Renal failure. EXAM: RENAL / URINARY TRACT ULTRASOUND COMPLETE COMPARISON:  CT 05/20/2016 . FINDINGS: Right Kidney: Length: 11.8 cm. Cortical thinning . Echogenicity within normal limits. No mass or hydronephrosis visualized. Left Kidney: Length: 12.3 cm. Cortical thinning. Echogenicity within normal limits. No hydronephrosis visualized. 4.0 cm simple cyst left lower renal pole Bladder: Appears normal for degree of bladder distention. Limited exam due to patient body habitus. IMPRESSION: 1. Bilateral renal cortical thinning. No acute renal abnormality identified. No hydronephrosis. 4 cm simple cyst left kidney. 2. No evidence of bladder distention. Electronically Signed   By: Marcello Moores  Register   On: 11/02/2016 07:14   Nm Myocar Multi W/spect Tamela Oddi Motion / Ef  Result Date: 11/02/2016  There was no ST segment deviation noted during stress.  No T  wave inversion was noted during stress.  Defect 1: There is a medium defect of moderate severity present in the basal inferior, mid inferior, mid inferolateral and apical inferior location. These changed could be consistent with diaphragmatic attenuation. However, given the associated wall motion abnormality cannot rule out infarct with peri-infarct ischemia.  This is an intermediate  risk study due to reduced systolic function.  The left ventricular ejection fraction is moderately decreased (30-44%).    Dg Chest Port 1 View  Result Date: 11/02/2016 CLINICAL DATA:  Short of breath and weakness EXAM: PORTABLE CHEST 1 VIEW COMPARISON:  Yesterday FINDINGS: Moderate cardiomegaly. Vascular congestion improved. Low volumes. Bibasilar atelectasis. No pneumothorax. IMPRESSION: Improved vascular congestion. Electronically Signed   By: Marybelle Killings M.D.   On: 11/02/2016 07:34    Labs: BMET  Recent Labs Lab 10/30/16 1202 11/01/16 1032 11/02/16 0421 11/03/16 0543  NA 130* 133* 135 134*  K 4.1 4.7 3.9 3.5  CL 95* 95* 98* 93*  CO2 23 22 25 27   GLUCOSE 97 264* 91 104*  BUN 61* 90* 97* 96*  CREATININE 4.44* 4.28* 4.23* 3.63*  CALCIUM 8.0* 8.4* 8.0* 8.2*   CBC  Recent Labs Lab 10/30/16 1202 11/01/16 1032 11/02/16 0421 11/03/16 0543  WBC 12.5* 10.5 10.2 6.4  NEUTROABS 8.9*  --   --   --   HGB 6.6* 10.7* 10.0* 10.0*  HCT 19.8* 32.2* 29.9* 30.8*  MCV 91.7 89.4 89.3 89.8  PLT 165 164 171 175    Medications:    . amLODipine  5 mg Oral Daily  . aspirin EC  81 mg Oral Daily  . atorvastatin  20 mg Oral q1800  . calcitRIOL  0.25 mcg Oral Daily  . heparin  5,000 Units Subcutaneous Q8H  . hydrALAZINE  50 mg Oral TID  . insulin aspart  0-20 Units Subcutaneous TID WC  . insulin aspart  0-5 Units Subcutaneous QHS  . insulin glargine  45 Units Subcutaneous BID  . isosorbide mononitrate  60 mg Oral Daily  . magnesium oxide  400 mg Oral Daily  . montelukast  10 mg Oral QHS  . potassium chloride  40 mEq Oral Once  . prasugrel  10 mg Oral Daily  . torsemide  20 mg Oral BID   Elmarie Shiley, MD 11/03/2016, 8:25 AM

## 2016-11-03 NOTE — Progress Notes (Signed)
Progress Note  Patient Name: Kent Osborne Date of Encounter: 11/03/2016  Primary Cardiologist: Dr [facility]  Subjective   Pt is feeling better this morning. He slept well, was able to lie flat and used his CPAP without problems.   Inpatient Medications    Scheduled Meds: . amLODipine  5 mg Oral Daily  . aspirin EC  81 mg Oral Daily  . atorvastatin  20 mg Oral q1800  . calcitRIOL  0.25 mcg Oral Daily  . heparin  5,000 Units Subcutaneous Q8H  . hydrALAZINE  50 mg Oral TID  . insulin aspart  0-20 Units Subcutaneous TID WC  . insulin aspart  0-5 Units Subcutaneous QHS  . insulin glargine  45 Units Subcutaneous BID  . ipratropium-albuterol  3 mL Nebulization Q6H  . isosorbide mononitrate  60 mg Oral Daily  . magnesium oxide  400 mg Oral Daily  . montelukast  10 mg Oral QHS  . prasugrel  10 mg Oral Daily  . torsemide  20 mg Oral BID   Continuous Infusions:  PRN Meds: acetaminophen **OR** acetaminophen, benzonatate, guaiFENesin, hydrALAZINE, oxyCODONE   Vital Signs    Vitals:   11/02/16 1546 11/02/16 1958 11/02/16 2038 11/03/16 0519  BP:   (!) 155/46 (!) 151/71  Pulse:   70 73  Resp:   18 16  Temp:   98.7 F (37.1 C) 97.8 F (36.6 C)  TempSrc:   Oral Oral  SpO2: 96% 96% 99% 96%  Weight:    287 lb 3.2 oz (130.3 kg)  Height:        Intake/Output Summary (Last 24 hours) at 11/03/16 0729 Last data filed at 11/03/16 0600  Gross per 24 hour  Intake             1160 ml  Output             1125 ml  Net               35 ml   Filed Weights   11/01/16 1601 11/02/16 0618 11/03/16 0519  Weight: 286 lb 3.2 oz (129.8 kg) 291 lb 1.6 oz (132 kg) 287 lb 3.2 oz (130.3 kg)    Telemetry    1st degree AVB with rates in the 70's and intermittently frequent PVC's - Personally Reviewed  ECG    11/01/16- NSR, SB-59, 1st degree AVB, QTc-520  Physical Exam   GEN: Obese caucasian male. No acute distress.   Neck: No JVD Cardiac: RRR, no murmurs, rubs, or  gallops.  Respiratory: Lung sounds clear bilaterally GI: Soft, nontender, obese MS: trace lower leg edema; No deformity. Neuro:  Nonfocal  Psych: Normal affect   Labs    Chemistry  Recent Labs Lab 10/30/16 1202 11/01/16 1032 11/02/16 0421  NA 130* 133* 135  K 4.1 4.7 3.9  CL 95* 95* 98*  CO2 23 22 25   GLUCOSE 97 264* 91  BUN 61* 90* 97*  CREATININE 4.44* 4.28* 4.23*  CALCIUM 8.0* 8.4* 8.0*  PROT 6.7  --   --   ALBUMIN 3.3*  --   --   AST 43*  --   --   ALT 19  --   --   ALKPHOS 44  --   --   BILITOT 0.9  --   --   GFRNONAA 13* 13* 14*  GFRAA 15* 16* 16*  ANIONGAP 12 16* 12     Hematology  Recent Labs Lab 11/01/16 1032 11/02/16 0421 11/03/16 0543  WBC 10.5 10.2 6.4  RBC 3.60* 3.35* 3.43*  HGB 10.7* 10.0* 10.0*  HCT 32.2* 29.9* 30.8*  MCV 89.4 89.3 89.8  MCH 29.7 29.9 29.2  MCHC 33.2 33.4 32.5  RDW 13.3 13.3 13.5  PLT 164 171 175    Cardiac Enzymes  Recent Labs Lab 11/01/16 1800 11/01/16 2220 11/02/16 0421 11/02/16 0748  TROPONINI 5.12* 5.22* 4.73* 5.87*     Recent Labs Lab 11/01/16 1104  TROPIPOC 4.64*     BNP  Recent Labs Lab 10/30/16 1202  BNP 384.3*     DDimer No results for input(s): DDIMER in the last 168 hours.   Radiology    Dg Chest 2 View  Result Date: 11/01/2016 CLINICAL DATA:  Cough for 2 weeks.  Fever over 100 degrees. EXAM: CHEST  2 VIEW COMPARISON:  10/30/2016 FINDINGS: There is mild bilateral interstitial prominence. There is no focal parenchymal opacity. There is no pleural effusion or pneumothorax. There is stable cardiomegaly. The osseous structures are unremarkable. IMPRESSION: Cardiomegaly with mild pulmonary vascular congestion. Electronically Signed   By: Kathreen Devoid   On: 11/01/2016 11:56   US Renal  Result Date: 11/02/2016 CLINICAL DATA:  Renal failure. EXAM: RENAL / URINARY TRACT ULTRASOUND COMPLETE COMPARISON:  CT 05/20/2016 . FINDINGS: Right Kidney: Length: 11.8 cm. Cortical thinning . Echogenicity  within normal limits. No mass or hydronephrosis visualized. Left Kidney: Length: 12.3 cm. Cortical thinning. Echogenicity within normal limits. No hydronephrosis visualized. 4.0 cm simple cyst left lower renal pole Bladder: Appears normal for degree of bladder distention. Limited exam due to patient body habitus. IMPRESSION: 1. Bilateral renal cortical thinning. No acute renal abnormality identified. No hydronephrosis. 4 cm simple cyst left kidney. 2. No evidence of bladder distention. Electronically Signed   By: Marcello Moores  Register   On: 11/02/2016 07:14   Nm Myocar Multi W/spect Tamela Oddi Motion / Ef  Result Date: 11/02/2016  There was no ST segment deviation noted during stress.  No T wave inversion was noted during stress.  Defect 1: There is a medium defect of moderate severity present in the basal inferior, mid inferior, mid inferolateral and apical inferior location. These changed could be consistent with diaphragmatic attenuation. However, given the associated wall motion abnormality cannot rule out infarct with peri-infarct ischemia.  This is an intermediate risk study due to reduced systolic function.  The left ventricular ejection fraction is moderately decreased (30-44%).    Dg Chest Port 1 View  Result Date: 11/02/2016 CLINICAL DATA:  Short of breath and weakness EXAM: PORTABLE CHEST 1 VIEW COMPARISON:  Yesterday FINDINGS: Moderate cardiomegaly. Vascular congestion improved. Low volumes. Bibasilar atelectasis. No pneumothorax. IMPRESSION: Improved vascular congestion. Electronically Signed   By: Marybelle Killings M.D.   On: 11/02/2016 07:34    Cardiac Studies   11/02/16  Echo   Study Conclusions  - Left ventricle: The cavity size was mildly dilated. Systolic   function was normal. The estimated ejection fraction was in the   range of 50% to 55%. Wall motion was normal; there were no   regional wall motion abnormalities. - Left atrium: The atrium was moderately dilated. - Atrial septum:  There was increased thickness of the septum,   consistent with lipomatous hypertrophy.  _________________________________________________________________________  11/02/16  2 Day Nuclear stress test   There was no ST segment deviation noted during stress.  No T wave inversion was noted during stress.  Defect 1: There is a medium defect of moderate severity present in the basal inferior, mid inferior, mid  inferolateral and apical inferior location. These changed could be consistent with diaphragmatic attenuation. However, given the associated wall motion abnormality cannot rule out infarct with peri-infarct ischemia.  This is an intermediate risk study due to reduced systolic function.  The left ventricular ejection fraction is moderately decreased (30-44%).     Patient Profile     66 y.o. male from [place] with past medical history of CAD s/p LAD (X2) and circ stentlin in 2009 in [place], stage 4 CKD, IDDM, HTN, HLD, OSA-CPAP and asthmatic COPD. He presented to [facility] ED with weakness on 10/30/16- Hgb 6.6 (stool negative guaiac) and was transfused. He then left AMA but his daughter had him return on 3/14. His troponins are elevated.  Assessment & Plan    NSTEMI- -Suspect the pt had demand ischemia NSTEMI around 10/30/16. He denies chest pain and his Troponin is coming down. 6.59-->5.12-->5.22-->4.73. Dyspnea thought to be related to severe anemia. -Echo showed normal LV systolic function with EF 50-55%, mildly dilated LV and no regional wall motion abnormalities -2 day nuclear stress test was intermediate risk due to a reduced sysolic function of 56-38%, however, the echo showed normal systolic function with normal wall motion. Defects noted on imaging could likely be diaphragmatic attenuation given the patient's body habitus.  -The patient is not a candidate for cardiac cath due to renal status and anemia and is not having any chest pain.  -Pt had some volume excesswith shortness of  breath after IV fluids and home diuretics being held due to renal dysfunction. Nephrology was consulted and Dr. Posey Pronto feels that the dysfunction is likely possibly related to hypoperfusion in setting of severe anemia. The patient's breathing improved after IV lasix. Home dose of oral torsemide has been resumed.  -Pt is stable for discharge with close follow up with nephrology, IM, and cardiology. He can keep prearranged appointment with Dr. Rosalita Chessman for 11/28/2016. Can go home with NTG SL.  -Pt is having frequent PVC's, K+ is 3.5, will replete and check mag, resuming his labetalol may help suppress these  CAD- -S/P PCI with stents to LAD (x2) and CFX in 2009-[facility].  -Continue aspirin and Effient. Effient can be stopped if patient needs GI procedures in workup for anemia as his stents are old.  Anemia-  -Hgb 6.6 on 10/30/16- probably secondary to chronic renal disease as he was heme negative. He has improved symptomatically after transfusion. Hgb is 10. 7 (3/14), 10 (3/15) -Pt is feeling stronger   CRI- -Stage 4. He has been followed by Dr Marlowe Sax, nephrology in [place]. He does not have HD access yet. He is not a candidate for cath secondary to CRI.  -Home torsemide 20 mg bid and metolazone 2.5 mg prn are on hold. IM MD is going to contact Dr. Marlowe Sax for input.   HTN- -Home meds include labetalol 600 mg am, 300 mg noon, 600 mg pm.   HLD- -Continue home statin  COPD -Continue albuterol nebs prn -CPAP at hs  IDDM- -Per primary team  Obesity and sleep apnea-  -On C-pap and O2 at night  Signed, Daune Perch, NP  11/03/2016, 7:29 AM    Patient examined chart reviewed. 25 minutes with patient with over 50% counseling on issues of CAD, anemia and renal failure Myovue with moderate inferolateral ischemia. No chest pain. Give anemia and renal failure advance medical Rx D/c labatolol On coreg and imdur. D/c home outpatient f/u Dr Raynald Blend

## 2016-11-03 NOTE — Progress Notes (Signed)
Discharged to home,daughter to transport. D/c instructions and follow up appt discussed with patient,verbalized understanding. PIV removed s/s of swelling and infiltration noted.

## 2016-11-03 NOTE — Progress Notes (Signed)
CCMD called, patient having bigeminy PVC's. This was happening earlier today, so is MD aware.

## 2016-11-03 NOTE — Discharge Summary (Signed)
Physician Discharge Summary   Patient ID: Kent Osborne MRN: [id number] DOB/AGE: Sep 07, 1950 66 y.o.  Admit date: 11/01/2016 Discharge date: 11/03/2016  Primary Care Physician:  Betty Martinique, MD  Discharge Diagnoses:    . Acute renal failure superimposed on chronic kidney disease stage IV ([place]) . Essential hypertension . Chronic ischemic heart disease  NSTEMI   Anemia of chronic disease   Medical noncompliance   Consults:   Nephrology Cardiology  Recommendations for Outpatient Follow-up:  1. Please repeat CBC/BMET at next visit 2. Labetalol discontinued. Patient started on Coreg, hydralazine increased   DIET: Carb modified diet    Allergies:  No Known Allergies   DISCHARGE MEDICATIONS: Current Discharge Medication List    START taking these medications   Details  benzonatate (TESSALON) 200 MG capsule Take 1 capsule (200 mg total) by mouth 3 (three) times daily as needed for cough. Qty: 20 capsule, Refills: 0    carvedilol (COREG) 6.25 MG tablet Take 1 tablet (6.25 mg total) by mouth 2 (two) times daily with a meal. Qty: 60 tablet, Refills: 3    guaiFENesin (MUCINEX) 600 MG 12 hr tablet Take 1 tablet (600 mg total) by mouth every 12 (twelve) hours as needed for to loosen phlegm. Qty: 30 tablet, Refills: 0    isosorbide mononitrate (IMDUR) 60 MG 24 hr tablet Take 1 tablet (60 mg total) by mouth daily. Qty: 30 tablet, Refills: 4    nitroGLYCERIN (NITROSTAT) 0.4 MG SL tablet Place 1 tablet (0.4 mg total) under the tongue every 5 (five) minutes as needed for chest pain. Qty: 30 tablet, Refills: 12      CONTINUE these medications which have CHANGED   Details  allopurinol (ZYLOPRIM) 300 MG tablet Take 0.5 tablets (150 mg total) by mouth daily.    colchicine 0.6 MG tablet Take 1 tablet (0.6 mg total) by mouth daily as needed (for gout flares).    glipiZIDE (GLUCOTROL) 10 MG tablet Take 1 tablet (10 mg total) by mouth every evening.    hydrALAZINE  (APRESOLINE) 50 MG tablet Take 1 tablet (50 mg total) by mouth 3 (three) times daily. Qty: 90 tablet, Refills: 3    insulin aspart (NOVOLOG FLEXPEN) 100 UNIT/ML FlexPen Inject 21-25 Units into the skin 3 (three) times daily with meals. Pt uses per sliding scale.    Insulin Glargine (LANTUS SOLOSTAR) 100 UNIT/ML Solostar Pen Inject 45 Units into the skin 2 (two) times daily.    simvastatin (ZOCOR) 40 MG tablet Take 1 tablet (40 mg total) by mouth at bedtime. Qty: 30 tablet, Refills: 3      CONTINUE these medications which have NOT CHANGED   Details  albuterol (PROVENTIL HFA;VENTOLIN HFA) 108 (90 Base) MCG/ACT inhaler Inhale 2 puffs into the lungs every 6 (six) hours as needed for wheezing or shortness of breath. Qty: 1 Inhaler, Refills: 0   Associated Diagnoses: Intermittent asthma with acute exacerbation, unspecified asthma severity    aspirin EC 81 MG tablet Take 81 mg by mouth daily.    calcitRIOL (ROCALTROL) 0.25 MCG capsule Take 0.25 mcg by mouth daily.    magnesium oxide (MAG-OX) 400 MG tablet Take 400 mg by mouth daily.    metolazone (ZAROXOLYN) 2.5 MG tablet Take 2.5 mg by mouth daily as needed (fluid).    montelukast (SINGULAIR) 10 MG tablet Take 10 mg by mouth at bedtime.     ondansetron (ZOFRAN) 4 MG tablet Take 1 tablet (4 mg total) by mouth every 8 (eight) hours as needed for nausea  or vomiting. Qty: 15 tablet, Refills: 0   Associated Diagnoses: Nausea without vomiting    prasugrel (EFFIENT) 10 MG TABS Take 10 mg by mouth daily.      ranitidine (ZANTAC) 300 MG tablet TAKE ONE TABLET BY MOUTH AT BEDTIME Qty: 90 tablet, Refills: 1   Associated Diagnoses: Abdominal pain, LUQ    torsemide (DEMADEX) 20 MG tablet TAKE ONE TABLET BY MOUTH TWICE DAILY Qty: 200 tablet, Refills: 3    Vitamin D, Ergocalciferol, (DRISDOL) 50000 units CAPS capsule Take 50,000 Units by mouth every 7 (seven) days.      STOP taking these medications     doxycycline (VIBRA-TABS) 100 MG tablet       labetalol (NORMODYNE) 300 MG tablet      predniSONE (DELTASONE) 20 MG tablet          Brief H and P: For complete details please refer to admission H and P, but in brief Delmore Sear Ellingtonis a 66 y.o.malewith medical history significant of CAD, DM, HLD, HTN, Nephrolithiasis, presented for second time to ED complaining of generalized weakness and elevated trop. Complaining of shortness of breath and generalized weakness. Seen at [facility] emergency room 2 days ago and found to be anemic with hemoglobin of 8 and an elevated troponin around 6. Patient received 1 unit PRBC and was scheduled to be admitted to [facility] however patient deferred further treatment and left AMA. Symptoms have continued since that time. Patient states the symptoms continue to get worse.  Troponin was 5.1 at the time of admission, patient was admitted for NSTEMI, creatinine 4.2,   Hospital Course:  NSTEMI (non-ST elevated myocardial infarction) (HCC)With underlying history of CAD - troponin on 3/12 was 6.59, trended down to 5.1 on admission and 5.8 this am  - Also at the same time noted on 3/12 with a hemoglobin of 6.6, patient denied any hematochezia, melena or hematemesis, had left AMA from ED - 2-D echo showed EF of 50-55%, normal wall motion, no regional wall motion abnormalities - 2 day nuclear stress test was intermediate risk due to a reduced sysolic function of 91-47%, however, the echo showed normal systolic function with normal wall motion. Per cardiology, Defects noted on imaging could likely be diaphragmatic attenuation given the patient's body habitus. The patient is not a candidate for cardiac cath due to renal status and anemia and is not having any chest pain. - Patient was cleared to be discharged home by cardiology, he has a appointment with his cardiologist Dr. Alroy Dust on 11/28/16.     Acute renal failure superimposed on chronic kidney disease ([place]) with anemia, volume  overload - Per patient, has been noncompliant with meds and dietary indiscretion, denies any NSAID use or new medications. Still makes urine, no complaints of oliguria or anuria. -  On 3/15, patient complained of congestion, orthopnea, pedal edema, patient is on Demadex 20 mg twice a day with metolazone 2.5 mg as needed which were apparently held on admission due to worsening renal function and also received blood transfusion -IV fluids were discontinued, patient received Lasix 80 mg IV 1 with metolazone 5 mg PO x1 and renal consult was obtained.  - Per patient, baseline GFR is around 25, follows Dr. Marlowe Sax in [facility], has talked about dialysis in future but no plans yet - Renal ultrasound 3/15 showed bilateral renal cortical thinning, no hydronephrosis - Outpatient torsemide and metolazone has been resumed, patient was recommended medical compliance, outpatient follow-up with his nephrologist, Dr. Marlowe Sax  Anemia: Likely anemia of chronic disease - FOBT negative, Received 1 unit of packed RBC transfusion - May need to be started on ESA, outpatient follow-up with his nephrologist    Essential hypertension - Uncontrolled, hydralazine was increased, patient was started on Coreg, continue Imdur. Labetalol was discontinued - Improved with resuming outpatient diuretics    Diabetes mellitus with complication ([place]) - Patient was continued on Lantus and sliding scale insulin     COPD (chronic obstructive pulmonary disease) (HCC) - Currently wheezing, placed on DuoNeb's     OSA (obstructive sleep apnea) On CPAP  Hyperlipidemia - Continue statin    Day of Discharge BP (!) 151/71 (BP Location: Right Arm)   Pulse 73   Temp 97.8 F (36.6 C) (Oral)   Resp 16   Ht 5\' 8"  (1.727 m)   Wt 130.3 kg (287 lb 3.2 oz) Comment: scale a  SpO2 98%   BMI 43.67 kg/m   Physical Exam: General: Alert and awake oriented x3 not in any acute distress. HEENT: anicteric sclera, pupils  reactive to light and accommodation CVS: S1-S2 clear no murmur rubs or gallops Chest: clear to auscultation bilaterally, no wheezing rales or rhonchi Abdomen: soft nontender, nondistended, normal bowel sounds Extremities: no cyanosis, clubbing or edema noted bilaterally Neuro: Cranial nerves II-XII intact, no focal neurological deficits   The results of significant diagnostics from this hospitalization (including imaging, microbiology, ancillary and laboratory) are listed below for reference.    LAB RESULTS: Basic Metabolic Panel:  Recent Labs Lab 11/02/16 0421 11/03/16 0543 11/03/16 0830  NA 135 134*  --   K 3.9 3.5  --   CL 98* 93*  --   CO2 25 27  --   GLUCOSE 91 104*  --   BUN 97* 96*  --   CREATININE 4.23* 3.63*  --   CALCIUM 8.0* 8.2*  --   MG  --   --  2.0   Liver Function Tests:  Recent Labs Lab 10/30/16 1202  AST 43*  ALT 19  ALKPHOS 44  BILITOT 0.9  PROT 6.7  ALBUMIN 3.3*   No results for input(s): LIPASE, AMYLASE in the last 168 hours. No results for input(s): AMMONIA in the last 168 hours. CBC:  Recent Labs Lab 10/30/16 1202  11/02/16 0421 11/03/16 0543  WBC 12.5*  < > 10.2 6.4  NEUTROABS 8.9*  --   --   --   HGB 6.6*  < > 10.0* 10.0*  HCT 19.8*  < > 29.9* 30.8*  MCV 91.7  < > 89.3 89.8  PLT 165  < > 171 175  < > = values in this interval not displayed. Cardiac Enzymes:  Recent Labs Lab 11/02/16 0421 11/02/16 0748  TROPONINI 4.73* 5.87*   BNP: Invalid input(s): POCBNP CBG:  Recent Labs Lab 11/02/16 2048 11/03/16 0801  GLUCAP 130* 95    Significant Diagnostic Studies:  Dg Chest 2 View  Result Date: 11/01/2016 CLINICAL DATA:  Cough for 2 weeks.  Fever over 100 degrees. EXAM: CHEST  2 VIEW COMPARISON:  10/30/2016 FINDINGS: There is mild bilateral interstitial prominence. There is no focal parenchymal opacity. There is no pleural effusion or pneumothorax. There is stable cardiomegaly. The osseous structures are unremarkable.  IMPRESSION: Cardiomegaly with mild pulmonary vascular congestion. Electronically Signed   By: Kathreen Devoid   On: 11/01/2016 11:56   US Renal  Result Date: 11/02/2016 CLINICAL DATA:  Renal failure. EXAM: RENAL / URINARY TRACT ULTRASOUND COMPLETE COMPARISON:  CT 05/20/2016 .  FINDINGS: Right Kidney: Length: 11.8 cm. Cortical thinning . Echogenicity within normal limits. No mass or hydronephrosis visualized. Left Kidney: Length: 12.3 cm. Cortical thinning. Echogenicity within normal limits. No hydronephrosis visualized. 4.0 cm simple cyst left lower renal pole Bladder: Appears normal for degree of bladder distention. Limited exam due to patient body habitus. IMPRESSION: 1. Bilateral renal cortical thinning. No acute renal abnormality identified. No hydronephrosis. 4 cm simple cyst left kidney. 2. No evidence of bladder distention. Electronically Signed   By: Marcello Moores  Register   On: 11/02/2016 07:14   Nm Myocar Multi W/spect Tamela Oddi Motion / Ef  Result Date: 11/02/2016  There was no ST segment deviation noted during stress.  No T wave inversion was noted during stress.  Defect 1: There is a medium defect of moderate severity present in the basal inferior, mid inferior, mid inferolateral and apical inferior location. These changed could be consistent with diaphragmatic attenuation. However, given the associated wall motion abnormality cannot rule out infarct with peri-infarct ischemia.  This is an intermediate risk study due to reduced systolic function.  The left ventricular ejection fraction is moderately decreased (30-44%).    Dg Chest Port 1 View  Result Date: 11/02/2016 CLINICAL DATA:  Short of breath and weakness EXAM: PORTABLE CHEST 1 VIEW COMPARISON:  Yesterday FINDINGS: Moderate cardiomegaly. Vascular congestion improved. Low volumes. Bibasilar atelectasis. No pneumothorax. IMPRESSION: Improved vascular congestion. Electronically Signed   By: Marybelle Killings M.D.   On: 11/02/2016 07:34    2D  ECHO: Study Conclusions  - Left ventricle: The cavity size was mildly dilated. Systolic   function was normal. The estimated ejection fraction was in the   range of 50% to 55%. Wall motion was normal; there were no   regional wall motion abnormalities. - Left atrium: The atrium was moderately dilated. - Atrial septum: There was increased thickness of the septum,   consistent with lipomatous hypertrophy.   Disposition and Follow-up: Discharge Instructions    (HEART FAILURE PATIENTS) Call MD:  Anytime you have any of the following symptoms: 1) 3 pound weight gain in 24 hours or 5 pounds in 1 week 2) shortness of breath, with or without a dry hacking cough 3) swelling in the hands, feet or stomach 4) if you have to sleep on extra pillows at night in order to breathe.    Complete by:  As directed    Diet Carb Modified    Complete by:  As directed    Increase activity slowly    Complete by:  As directed        DISPOSITION: home    DISCHARGE FOLLOW-UP Follow-up Information    Betty Martinique, MD On 11/10/2016.   Specialty:  Family Medicine Why:  At 10:00am for hospital follow up  Contact information: [place] [address] [phone]            Time spent on Discharge: 29 mins   Signed:   Ariannah Arenson M.D. [facility] 11/03/2016, 9:35 AM Pager: [phone]

## 2016-11-06 ENCOUNTER — Telehealth: Payer: Self-pay

## 2016-11-06 NOTE — Telephone Encounter (Signed)
D/C: 11/03/16 To: home  Spoke with pt and he states that he is doing well. He does still have a productive cough. He reports he is taking all medications as directed. He has no questions or concerns at this point in time.  Appt scheduled with Dr. Martinique 11/10/16, pt aware.    Transition Care Management Follow-up Telephone Call  How have you been since you were released from the hospital? Good, improving   Do you understand why you were in the hospital? yes   Do you understand the discharge instrcutions? yes  Items Reviewed:  Medications reviewed: yes  Allergies reviewed: yes  Dietary changes reviewed: yes  Referrals reviewed: yes   Functional Questionnaire:   Activities of Daily Living (ADLs):   He states they are independent in the following: ambulation, bathing and hygiene, feeding, continence, grooming, toileting and dressing States they require assistance with the following: driving   Any transportation issues/concerns?: no   Any patient concerns? no   Confirmed importance and date/time of follow-up visits scheduled: yes   Confirmed with patient if condition begins to worsen call PCP or go to the ER.  Patient was given the Call-a-Nurse line [phone]: yes

## 2016-11-08 DIAGNOSIS — R0602 Shortness of breath: Secondary | ICD-10-CM | POA: Diagnosis not present

## 2016-11-08 DIAGNOSIS — R000 Tachycardia, unspecified: Secondary | ICD-10-CM | POA: Diagnosis not present

## 2016-11-08 DIAGNOSIS — D649 Anemia, unspecified: Secondary | ICD-10-CM | POA: Diagnosis not present

## 2016-11-09 NOTE — Progress Notes (Signed)
HPI:   [name] is a 66 y.o. male, who is here today to follow on recent hospitalization.  He has Hx of CAD,DM II,CKD IV,HTN,asthma/COPD,OSA on CPAP,and anemia among some.  He was seen here in the office on 10/30/16 for f/u on chronic medical problems.He was c/o fever,myalgias, worsening dyspnea,wheezing,and dizziness. He was referred to the ER because he could not get into his car due to weakness. Once in the ER he left AMA but went back 2 days later because worsening dyspnea  Hospitalized from 11/01/16 to 11/03/16. Discharged Dx: ARF on CKD IV, HTN, NSTEMI,chronic anemia, and medical noncompliance.   CXR 11/01/16: Cardiomegaly with mild pulmonary vascular congestion. Renal U/S 11/02/16:1. Bilateral renal cortical thinning. No acute renal abnormality identified. No hydronephrosis. 4 cm simple cyst left kidney.No evidence of bladder distention. Echo 11/02/16:- Left ventricle: The cavity size was mildly dilated. Systolic function was normal. The estimated ejection fraction was in the range of 50% to 55%. Wall motion was normal; there were no regional wall motion abnormalities. - Left atrium: The atrium was moderately dilated. - Atrial septum: There was increased thickness of the septum, consistent with lipomatous hypertrophy.  Stress test 11/02/16:There was no ST segment deviation noted during stress. No T wave inversion was noted during stress. Defect 1: There is a medium defect of moderate severity present in the basal inferior, mid inferior, mid inferolateral and apical inferior location. These changed could be consistent with diaphragmatic attenuation. However, given the associated wall motion abnormality cannot rule out infarct with peri-infarct ischemia. This is an intermediate risk study due to reduced systolic function. The left ventricular ejection fraction is moderately decreased (30-44%).  Labetalol was d/c and Coreg 6.25 mg bid started. Hydralazine was increased  to 50 mg tid and according to pt, decreased back to bid by nurse at the cardiologists's office because HR "a little high", 110-120/min x 2 times associated with dizzy spells x 2 times. He denies chest pain and exertional dyspnea improved, more like "tiredness" with prolonged walking.  He is weighting at home daily, wt has been stable otherwise.  Anemia: H/H 10/30/16 was 6.6/19.8.  S/P transfusion of a unit of RBC's.  Lab Results  Component Value Date   WBC 6.4 11/03/2016   HGB 10.0 (L) 11/03/2016   HCT 30.8 (L) 11/03/2016   MCV 89.8 11/03/2016   PLT 175 11/03/2016   Lab Results  Component Value Date   CREATININE 3.63 (H) 11/03/2016   BUN 96 (H) 11/03/2016   NA 134 (L) 11/03/2016   K 3.5 11/03/2016   CL 93 (L) 11/03/2016   CO2 27 11/03/2016   Reporting labs done yesterday at his cardiologists' office, Hg 10, rest of labs pending.   Appt with Dr Joya Salm, 4/10/118. He denies gross hematuria or blood in stool.  In general he is feeling better,not yet at his baseline. He denies decreased appetite. Reporting BS's "good", one episode of hypoglycemia at 50.  He is not doing PT and no HH service at the time of his discharge. Independent ADL's.   TCM phone call 11/06/16.   Review of Systems  Constitutional: Positive for fatigue. Negative for activity change, appetite change, fever and unexpected weight change.  HENT: Negative for mouth sores, nosebleeds, sore throat and trouble swallowing.   Eyes: Negative for redness and visual disturbance.  Respiratory: Positive for shortness of breath. Negative for cough and wheezing.   Cardiovascular: Positive for palpitations. Negative for chest pain and leg swelling.  Gastrointestinal:  Negative for abdominal pain, blood in stool, nausea and vomiting.       No changes in bowel habits  Endocrine: Negative for polydipsia, polyphagia and polyuria.  Genitourinary: Negative for decreased urine volume and hematuria.  Skin:  Negative for rash.  Neurological: Positive for light-headedness (occasional). Negative for seizures, weakness and headaches.  Psychiatric/Behavioral: Negative for confusion. The patient is not nervous/anxious.       Current Outpatient Prescriptions on File Prior to Visit  Medication Sig Dispense Refill  . albuterol (PROVENTIL HFA;VENTOLIN HFA) 108 (90 Base) MCG/ACT inhaler Inhale 2 puffs into the lungs every 6 (six) hours as needed for wheezing or shortness of breath. 1 Inhaler 0  . allopurinol (ZYLOPRIM) 300 MG tablet Take 0.5 tablets (150 mg total) by mouth daily.    Marland Kitchen aspirin EC 81 MG tablet Take 81 mg by mouth daily.    . benzonatate (TESSALON) 200 MG capsule Take 1 capsule (200 mg total) by mouth 3 (three) times daily as needed for cough. 20 capsule 0  . calcitRIOL (ROCALTROL) 0.25 MCG capsule Take 0.25 mcg by mouth daily.    . carvedilol (COREG) 6.25 MG tablet Take 1 tablet (6.25 mg total) by mouth 2 (two) times daily with a meal. 60 tablet 3  . colchicine 0.6 MG tablet Take 1 tablet (0.6 mg total) by mouth daily as needed (for gout flares).    Marland Kitchen glipiZIDE (GLUCOTROL) 10 MG tablet Take 1 tablet (10 mg total) by mouth every evening.    Marland Kitchen guaiFENesin (MUCINEX) 600 MG 12 hr tablet Take 1 tablet (600 mg total) by mouth every 12 (twelve) hours as needed for to loosen phlegm. 30 tablet 0  . hydrALAZINE (APRESOLINE) 50 MG tablet Take 1 tablet (50 mg total) by mouth 3 (three) times daily. 90 tablet 3  . insulin aspart (NOVOLOG FLEXPEN) 100 UNIT/ML FlexPen Inject 21-25 Units into the skin 3 (three) times daily with meals. Pt uses per sliding scale.    . Insulin Glargine (LANTUS SOLOSTAR) 100 UNIT/ML Solostar Pen Inject 45 Units into the skin 2 (two) times daily.    . isosorbide mononitrate (IMDUR) 60 MG 24 hr tablet Take 1 tablet (60 mg total) by mouth daily. 30 tablet 4  . magnesium oxide (MAG-OX) 400 MG tablet Take 400 mg by mouth daily.    . metolazone (ZAROXOLYN) 2.5 MG tablet Take 2.5 mg by  mouth daily as needed (fluid).    . montelukast (SINGULAIR) 10 MG tablet Take 10 mg by mouth at bedtime.     . nitroGLYCERIN (NITROSTAT) 0.4 MG SL tablet Place 1 tablet (0.4 mg total) under the tongue every 5 (five) minutes as needed for chest pain. 30 tablet 12  . prasugrel (EFFIENT) 10 MG TABS Take 10 mg by mouth daily.      . ranitidine (ZANTAC) 300 MG tablet TAKE ONE TABLET BY MOUTH AT BEDTIME 90 tablet 1  . simvastatin (ZOCOR) 40 MG tablet Take 1 tablet (40 mg total) by mouth at bedtime. 30 tablet 3  . torsemide (DEMADEX) 20 MG tablet TAKE ONE TABLET BY MOUTH TWICE DAILY 200 tablet 3  . Vitamin D, Ergocalciferol, (DRISDOL) 50000 units CAPS capsule Take 50,000 Units by mouth every 7 (seven) days.     No current facility-administered medications on file prior to visit.      Past Medical History:  Diagnosis Date  . Acute renal failure superimposed on chronic kidney disease ([place])    [name] 11/01/2016  . Anemia   . Arthritis    "  back, knees" (11/01/2016)  . Asthma   . CAD (coronary artery disease) 12/31/07   [facility] stents- placed in the circumflex and LAD  . Chronic bronchitis ([place])   . History of blood transfusion 10/30/2016   "related to anemia"  . History of gout   . History of kidney stones   . Hyperlipidemia   . Hypertension   . NSTEMI (non-ST elevated myocardial infarction) ([place]) 10/30/2016  . OA (osteoarthritis)   . Obese   . On home oxygen therapy    "2L w/CPAP" (11/01/2016)  . OSA on CPAP   . Psoriatic arthritis ([place])   . Type II diabetes mellitus (HCC)    No Known Allergies  Social History   Social History  . Marital status: Divorced    Spouse name: N/A  . Number of children: N/A  . Years of education: N/A   Social History Main Topics  . Smoking status: Never Smoker  . Smokeless tobacco: Never Used  . Alcohol use Yes     Comment: 11/01/2016 "nothing in years"  . Drug use: No  . Sexual activity: Yes   Other Topics Concern  . None   Social  History Narrative  . None    Vitals:   11/10/16 0949  BP: (!) 150/88  Pulse: 78  Resp: 12  O2 sat at RA 97% Body mass index is 42.61 kg/m.  Physical Exam  Nursing note and vitals reviewed. Constitutional: He is oriented to person, place, and time. He appears well-developed. No distress.  HENT:  Head: Atraumatic.  Mouth/Throat: Oropharynx is clear and moist and mucous membranes are normal.  Eyes: Conjunctivae and EOM are normal.  Cardiovascular: Normal rate and regular rhythm.   Heart sounds distant.I do not appreciate murmurs. PT pulses present bilateral.  Respiratory: Effort normal. No respiratory distress. He has no wheezes. He has no rales.  GI: Soft. He exhibits no mass. There is no hepatomegaly. There is no tenderness.  Musculoskeletal: He exhibits edema (1+ pitting LE edema LE bilateral.). He exhibits no tenderness.  Lymphadenopathy:    He has no cervical adenopathy.  Neurological: He is alert and oriented to person, place, and time. Coordination normal.  No focal deficit. Stable gait, some limping,not assisted.  Skin: Skin is warm. No erythema.  Psychiatric: He has a normal mood and affect.  Well groomed, good eye contact.      ASSESSMENT AND PLAN:   Guillermo was seen today for hospitalization follow-up.  Diagnoses and all orders for this visit:  NSTEMI (non-ST elevated myocardial infarction) ([place])  Elevated troponin with otherwise normal stress test. We discussed other possible causes of elevated troponin as well as  symptoms of acute anemia and CAD. Continue Coreg and Imdur. Instructed about warning signs. Keep appt with cardiologists.  Essential hypertension  Today mildly elevated. Rechecked 150/80. Medication was just adjusted yesterday and has appt with cardiologists arranged, next week. Monitor BP at home, if still elevated Coreg could be increased. Low salt diet. Instructed about warning signs.  Chronic kidney disease (CKD), stage IV (severe)  ([place])  He is reporting lab work done yesterday,I do not see report of OV or lab results at the time of his visit. He is on Zaroxolin and Demadex. Low salt diet,avoid NSAID's, and adequate hydration. Keep appt with Dr Marlowe Sax.  Anemia, unspecified type  Anemia of chronic disease  but not clear about what caused worsening anemia. Last Hg reported by pt 10 (11/09/16). Colonoscopy: Not done. He has refused procedure and still  not interested.  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 If he has frequent BS's < 70 we need to consider readjusting treatment,if < 60 we need to decrease or discontinue Glipizide. F/U in 3-4 months,before if needed.    Lori Liew G. Martinique, MD  [facility]. [place] office.

## 2016-11-10 ENCOUNTER — Encounter: Payer: Self-pay | Admitting: Family Medicine

## 2016-11-10 ENCOUNTER — Ambulatory Visit (INDEPENDENT_AMBULATORY_CARE_PROVIDER_SITE_OTHER): Payer: Medicare Other | Admitting: Family Medicine

## 2016-11-10 VITALS — BP 150/88 | HR 78 | Resp 12 | Ht 68.0 in | Wt 280.2 lb

## 2016-11-10 DIAGNOSIS — I10 Essential (primary) hypertension: Secondary | ICD-10-CM

## 2016-11-10 DIAGNOSIS — I214 Non-ST elevation (NSTEMI) myocardial infarction: Secondary | ICD-10-CM | POA: Diagnosis not present

## 2016-11-10 DIAGNOSIS — IMO0002 Reserved for concepts with insufficient information to code with codable children: Secondary | ICD-10-CM

## 2016-11-10 DIAGNOSIS — E1151 Type 2 diabetes mellitus with diabetic peripheral angiopathy without gangrene: Secondary | ICD-10-CM

## 2016-11-10 DIAGNOSIS — E1165 Type 2 diabetes mellitus with hyperglycemia: Secondary | ICD-10-CM

## 2016-11-10 DIAGNOSIS — D649 Anemia, unspecified: Secondary | ICD-10-CM | POA: Diagnosis not present

## 2016-11-10 DIAGNOSIS — N184 Chronic kidney disease, stage 4 (severe): Secondary | ICD-10-CM | POA: Diagnosis not present

## 2016-11-10 NOTE — Patient Instructions (Addendum)
A few things to remember from today's visit:   [facility] (non-ST elevated myocardial infarction) [facility]  Essential hypertension - Plan: Basic metabolic panel  Chronic kidney disease (CKD), stage IV (severe) (HCC) - Plan: Basic metabolic panel  Anemia, unspecified type  Since labs done yesterday no labs today.  BP 142/80.  Keep appt with heart and kidney doctor. Keep logs with blood pressure and pulse.   Please be sure medication list is accurate. If a new problem present, please set up appointment sooner than planned today.

## 2016-11-10 NOTE — Progress Notes (Signed)
Pre visit review using our clinic review tool, if applicable. No additional management support is needed unless otherwise documented below in the visit note. 

## 2016-11-13 ENCOUNTER — Encounter: Payer: Self-pay | Admitting: Family Medicine

## 2016-11-13 ENCOUNTER — Other Ambulatory Visit: Payer: Self-pay | Admitting: Family Medicine

## 2016-11-13 DIAGNOSIS — G8929 Other chronic pain: Secondary | ICD-10-CM | POA: Diagnosis not present

## 2016-11-13 DIAGNOSIS — M47896 Other spondylosis, lumbar region: Secondary | ICD-10-CM | POA: Diagnosis not present

## 2016-11-13 DIAGNOSIS — Z79899 Other long term (current) drug therapy: Secondary | ICD-10-CM | POA: Diagnosis not present

## 2016-11-13 DIAGNOSIS — G4700 Insomnia, unspecified: Secondary | ICD-10-CM | POA: Diagnosis not present

## 2016-11-16 DIAGNOSIS — I2510 Atherosclerotic heart disease of native coronary artery without angina pectoris: Secondary | ICD-10-CM | POA: Diagnosis not present

## 2016-11-16 DIAGNOSIS — N184 Chronic kidney disease, stage 4 (severe): Secondary | ICD-10-CM | POA: Diagnosis not present

## 2016-11-16 DIAGNOSIS — I872 Venous insufficiency (chronic) (peripheral): Secondary | ICD-10-CM | POA: Diagnosis not present

## 2016-11-16 DIAGNOSIS — I10 Essential (primary) hypertension: Secondary | ICD-10-CM | POA: Diagnosis not present

## 2016-11-16 DIAGNOSIS — E785 Hyperlipidemia, unspecified: Secondary | ICD-10-CM | POA: Diagnosis not present

## 2016-11-22 DIAGNOSIS — N184 Chronic kidney disease, stage 4 (severe): Secondary | ICD-10-CM | POA: Diagnosis not present

## 2016-11-22 DIAGNOSIS — D649 Anemia, unspecified: Secondary | ICD-10-CM | POA: Diagnosis not present

## 2016-11-22 DIAGNOSIS — E1140 Type 2 diabetes mellitus with diabetic neuropathy, unspecified: Secondary | ICD-10-CM | POA: Diagnosis not present

## 2016-11-22 DIAGNOSIS — I2510 Atherosclerotic heart disease of native coronary artery without angina pectoris: Secondary | ICD-10-CM | POA: Diagnosis not present

## 2016-11-22 DIAGNOSIS — E559 Vitamin D deficiency, unspecified: Secondary | ICD-10-CM | POA: Diagnosis not present

## 2016-11-22 DIAGNOSIS — N179 Acute kidney failure, unspecified: Secondary | ICD-10-CM | POA: Diagnosis not present

## 2016-11-27 DIAGNOSIS — B351 Tinea unguium: Secondary | ICD-10-CM | POA: Diagnosis not present

## 2016-11-27 DIAGNOSIS — M2010 Hallux valgus (acquired), unspecified foot: Secondary | ICD-10-CM | POA: Diagnosis not present

## 2016-11-27 DIAGNOSIS — E1140 Type 2 diabetes mellitus with diabetic neuropathy, unspecified: Secondary | ICD-10-CM | POA: Diagnosis not present

## 2016-11-27 DIAGNOSIS — M79673 Pain in unspecified foot: Secondary | ICD-10-CM | POA: Diagnosis not present

## 2016-12-04 ENCOUNTER — Other Ambulatory Visit: Payer: Self-pay | Admitting: Family Medicine

## 2016-12-11 DIAGNOSIS — M2010 Hallux valgus (acquired), unspecified foot: Secondary | ICD-10-CM | POA: Diagnosis not present

## 2016-12-11 DIAGNOSIS — M79673 Pain in unspecified foot: Secondary | ICD-10-CM | POA: Diagnosis not present

## 2016-12-11 DIAGNOSIS — E1140 Type 2 diabetes mellitus with diabetic neuropathy, unspecified: Secondary | ICD-10-CM | POA: Diagnosis not present

## 2016-12-11 DIAGNOSIS — L600 Ingrowing nail: Secondary | ICD-10-CM | POA: Diagnosis not present

## 2016-12-25 DIAGNOSIS — N184 Chronic kidney disease, stage 4 (severe): Secondary | ICD-10-CM | POA: Diagnosis not present

## 2016-12-28 ENCOUNTER — Telehealth: Payer: Self-pay | Admitting: Family Medicine

## 2017-01-01 ENCOUNTER — Other Ambulatory Visit: Payer: Self-pay

## 2017-01-01 MED ORDER — ALLOPURINOL 300 MG PO TABS: 150.0000 mg | ORAL_TABLET | Freq: Every day | ORAL | 1 refills | Status: DC

## 2017-01-01 NOTE — Telephone Encounter (Signed)
Rx sent 

## 2017-01-01 NOTE — Telephone Encounter (Signed)
Pt request refill  allopurinol (ZYLOPRIM) 300 MG tablet  [facility] [place] [place]  Last filled by dr todd.  Pt sees Dr Martinique now. Thank you!

## 2017-01-25 DIAGNOSIS — N184 Chronic kidney disease, stage 4 (severe): Secondary | ICD-10-CM | POA: Diagnosis not present

## 2017-02-12 DIAGNOSIS — Z79891 Long term (current) use of opiate analgesic: Secondary | ICD-10-CM | POA: Diagnosis not present

## 2017-02-12 DIAGNOSIS — Z79899 Other long term (current) drug therapy: Secondary | ICD-10-CM | POA: Diagnosis not present

## 2017-02-12 DIAGNOSIS — M47896 Other spondylosis, lumbar region: Secondary | ICD-10-CM | POA: Diagnosis not present

## 2017-02-12 DIAGNOSIS — G894 Chronic pain syndrome: Secondary | ICD-10-CM | POA: Diagnosis not present

## 2017-02-12 DIAGNOSIS — G4700 Insomnia, unspecified: Secondary | ICD-10-CM | POA: Diagnosis not present

## 2017-02-12 DIAGNOSIS — G8929 Other chronic pain: Secondary | ICD-10-CM | POA: Diagnosis not present

## 2017-02-22 ENCOUNTER — Other Ambulatory Visit: Payer: Self-pay | Admitting: Family Medicine

## 2017-02-22 DIAGNOSIS — R1012 Left upper quadrant pain: Secondary | ICD-10-CM

## 2017-03-02 ENCOUNTER — Other Ambulatory Visit: Payer: Self-pay

## 2017-03-02 ENCOUNTER — Telehealth: Payer: Self-pay | Admitting: Family Medicine

## 2017-03-02 DIAGNOSIS — E083313 Diabetes mellitus due to underlying condition with moderate nonproliferative diabetic retinopathy with macular edema, bilateral: Secondary | ICD-10-CM | POA: Diagnosis not present

## 2017-03-02 MED ORDER — CARVEDILOL 6.25 MG PO TABS: 6.2500 mg | ORAL_TABLET | Freq: Two times a day (BID) | ORAL | 1 refills | Status: DC

## 2017-03-02 NOTE — Telephone Encounter (Signed)
Noted, will keep an eye out for the refill request.  Thanks!

## 2017-03-02 NOTE — Telephone Encounter (Signed)
Pt need new Rx for Bp did not know the name of the medication stated that the pharmacy will send in the request he was just wanting to make [place] aware they will be sending in the request.

## 2017-03-06 ENCOUNTER — Other Ambulatory Visit: Payer: Self-pay | Admitting: Family Medicine

## 2017-03-08 NOTE — Progress Notes (Signed)
HPI:   Kent Osborne is a 66 y.o. male, who is here today for 4 months follow up.   He was last seen on 11/10/16 for hospitalization follow up.  Diabetes Mellitus II:   He is currently on Lantus 45 U bid,Glipizide 10 mg daily, and Novolog per sliding scale. About 20-25 U daily.   Last eye exam: about a year ago. Checking BS's : FG: 130-140.  Before dinner: 180-200   Bedtime: < 200. Hypoglycemia: Denies.  He is tolerating medications well. He denies abdominal pain, nausea, vomiting, polydipsia, polyuria, or polyphagia. Feet stinging and burning and achy pain.  According to pt, his podiatrists recommended a "vit" to help with neuropathic pain.    Lab Results  Component Value Date   CREATININE 3.63 (H) 11/03/2016   BUN 96 (H) 11/03/2016   NA 134 (L) 11/03/2016   K 3.5 11/03/2016   CL 93 (L) 11/03/2016   CO2 27 11/03/2016    Lab Results  Component Value Date   HGBA1C 6.4 (H) 11/01/2016    HTN:  He is currently on Hydralazine 50 mg tid, Imdur 60 mg daily, and Coreg 6.25 mg bid. He is also on Torsemide 20 mg bid and Zaroxolyn 2.5 mg daily as needed.  Denies severe/frequent headache, visual changes, chest pain, dyspnea, palpitation, claudication, focal weakness, or edema.  + CAD, CKD IV: He follows with cardiologist and nephrologist respectively. OSA wearing CPAP every night.  He saw his nephrologist about 2 months ago and next month with nephro. According to patient, he is having CBC monthly, last one Hg 11.  HLD:  Currently he is on simvastatin 40 mg daily. Tolerating medication well. He is following a low fat diet. He does not exercise regularly due to arthralgias.  Lab Results  Component Value Date   CHOL 104 10/30/2016   HDL 24.90 (L) 10/30/2016   LDLCALC 45 10/30/2016   LDLDIRECT 80.7 05/05/2010   TRIG 170.0 (H) 10/30/2016   CHOLHDL 4 10/30/2016   Gout: He is currently on colchicine 0.6 mg as needed for acute  exacerbations. Allopurinol 300 mg 1/2 tab daily. He has not had an exacerbation in a while.  Last U. Acid 10-11 months ago was 7.0.  Hx of generalized OA.   Review of Systems  Constitutional: Positive for fatigue (no more than usual). Negative for activity change, appetite change and fever.  HENT: Negative for mouth sores, nosebleeds, sore throat and trouble swallowing.   Eyes: Negative for redness and visual disturbance.  Respiratory: Negative for cough, shortness of breath and wheezing.   Cardiovascular: Negative for chest pain, palpitations and leg swelling.  Gastrointestinal: Negative for abdominal pain, nausea and vomiting.  Endocrine: Negative for polydipsia, polyphagia and polyuria.  Genitourinary: Negative for decreased urine volume and hematuria.  Musculoskeletal: Positive for arthralgias. Negative for myalgias.  Skin: Negative for rash and wound.  Allergic/Immunologic: Positive for environmental allergies.  Neurological: Negative for syncope, weakness and headaches.  Psychiatric/Behavioral: Negative for confusion. The patient is not nervous/anxious.     Current Outpatient Prescriptions on File Prior to Visit  Medication Sig Dispense Refill  . albuterol (PROVENTIL HFA;VENTOLIN HFA) 108 (90 Base) MCG/ACT inhaler Inhale 2 puffs into the lungs every 6 (six) hours as needed for wheezing or shortness of breath. 1 Inhaler 0  . allopurinol (ZYLOPRIM) 300 MG tablet Take 0.5 tablets (150 mg total) by mouth daily. 45 tablet 1  . amitriptyline (ELAVIL) 10 MG tablet Take 10 mg by mouth.    Marland Kitchen  aspirin EC 81 MG tablet Take 81 mg by mouth daily.    . calcitRIOL (ROCALTROL) 0.25 MCG capsule Take 0.25 mcg by mouth daily.    . carvedilol (COREG) 6.25 MG tablet Take 1 tablet (6.25 mg total) by mouth 2 (two) times daily with a meal. 180 tablet 1  . colchicine 0.6 MG tablet Take 1 tablet (0.6 mg total) by mouth daily as needed (for gout flares).    Marland Kitchen glipiZIDE (GLUCOTROL) 10 MG tablet Take 1  tablet (10 mg total) by mouth daily before breakfast. 30 tablet 1  . guaiFENesin (MUCINEX) 600 MG 12 hr tablet Take 1 tablet (600 mg total) by mouth every 12 (twelve) hours as needed for to loosen phlegm. 30 tablet 0  . insulin aspart (NOVOLOG FLEXPEN) 100 UNIT/ML FlexPen Inject 21-25 Units into the skin 3 (three) times daily with meals. Pt uses per sliding scale.    . Insulin Glargine (LANTUS SOLOSTAR) 100 UNIT/ML Solostar Pen Inject 45 Units into the skin 2 (two) times daily. 90 mL 3  . magnesium oxide (MAG-OX) 400 MG tablet Take 400 mg by mouth daily.    . metolazone (ZAROXOLYN) 2.5 MG tablet Take 2.5 mg by mouth daily as needed (fluid).    . montelukast (SINGULAIR) 10 MG tablet Take 10 mg by mouth at bedtime.     . nitroGLYCERIN (NITROSTAT) 0.4 MG SL tablet Place 1 tablet (0.4 mg total) under the tongue every 5 (five) minutes as needed for chest pain. 30 tablet 12  . Oxycodone HCl 10 MG TABS Take 10 mg by mouth 4 (four) times daily as needed.    . prasugrel (EFFIENT) 10 MG TABS Take 10 mg by mouth daily.      . ranitidine (ZANTAC) 300 MG tablet TAKE ONE TABLET BY MOUTH AT BEDTIME 90 tablet 1  . torsemide (DEMADEX) 20 MG tablet TAKE ONE TABLET BY MOUTH TWICE DAILY 200 tablet 3  . Vitamin D, Ergocalciferol, (DRISDOL) 50000 units CAPS capsule Take 50,000 Units by mouth every 7 (seven) days.     No current facility-administered medications on file prior to visit.      Past Medical History:  Diagnosis Date  . Acute renal failure superimposed on chronic kidney disease ([place])    [name] 11/01/2016  . Anemia   . Arthritis    "back, knees" (11/01/2016)  . Asthma   . CAD (coronary artery disease) 12/31/07   [facility] stents- placed in the circumflex and LAD  . Chronic bronchitis ([place])   . History of blood transfusion 10/30/2016   "related to anemia"  . History of gout   . History of kidney stones   . Hyperlipidemia   . Hypertension   . NSTEMI (non-ST elevated myocardial infarction)  ([place]) 10/30/2016  . OA (osteoarthritis)   . Obese   . On home oxygen therapy    "2L w/CPAP" (11/01/2016)  . OSA on CPAP   . Psoriatic arthritis ([place])   . Type II diabetes mellitus (HCC)    No Known Allergies  Social History   Social History  . Marital status: Divorced    Spouse name: N/A  . Number of children: N/A  . Years of education: N/A   Social History Main Topics  . Smoking status: Never Smoker  . Smokeless tobacco: Never Used  . Alcohol use Yes     Comment: 11/01/2016 "nothing in years"  . Drug use: No  . Sexual activity: Yes   Other Topics Concern  . None  Social History Narrative  . None    Vitals:   03/09/17 0851  BP: 130/90  Pulse: 74  Resp: 16   Body mass index is 46.3 kg/m.  Wt Readings from Last 3 Encounters:  03/09/17 (!) 304 lb 8 oz (138.1 kg)  11/10/16 280 lb 4 oz (127.1 kg)  11/03/16 287 lb 3.2 oz (130.3 kg)    Physical Exam  Nursing note and vitals reviewed. Constitutional: He is oriented to person, place, and time. He appears well-developed. No distress.  HENT:  Head: Atraumatic.  Mouth/Throat: Oropharynx is clear and moist and mucous membranes are normal.  Eyes: Pupils are equal, round, and reactive to light. Conjunctivae and EOM are normal.  Cardiovascular: Normal rate and regular rhythm.   No murmur heard. DP pulses present bilateral.  Respiratory: Effort normal and breath sounds normal. No respiratory distress.  GI: Soft. There is no tenderness.  Musculoskeletal: He exhibits edema (1+ pitting edema LE,bilateral.). He exhibits no tenderness.  Lymphadenopathy:    He has no cervical adenopathy.  Neurological: He is alert and oriented to person, place, and time. He has normal strength.  Stable gait with no assistance.  Skin: Skin is warm. No erythema.  Psychiatric: He has a normal mood and affect. Cognition and memory are normal.  Well groomed, good eye contact.   Foot exam 04/2016.   ASSESSMENT AND PLAN:   Mr. VERSHAWN WESTRUP was seen today for 4 months follow-up.  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 No changes in current management, will adjust treatment according to lab results. Regular exercise and healthy diet with avoidance of added sugar food intake is an important part of treatment and recommended. Annual eye exam, periodic dental and foot care recommended. F/U in 5-6 months  -     Hemoglobin A1c -     Fructosamine  Class 3 obesity with serious comorbidity and body mass index (BMI) of 45.0 to 49.9 in adult, unspecified obesity type ([place])  Gained about 24 Lb since his last OV. We discussed benefits of wt loss as well as adverse effects of obesity. Consistency with healthy diet and physical activity recommended. Daily brisk walking for 15-30 min as tolerated.  Essential hypertension  Otherwise adequately controlled, today DBP slightly elevated but reporting better BP's at home. No changes in current management. DASH-low salt diet to continue. Eye exam recommended annually. F/U in 5-6 months, before if needed.  -     isosorbide mononitrate (IMDUR) 60 MG 24 hr tablet; Take 1 tablet (60 mg total) by mouth daily. -     hydrALAZINE (APRESOLINE) 50 MG tablet; Take 1 tablet (50 mg total) by mouth 3 (three) times daily.  Gout, arthropathy  Stable. No changes in current management. F/U in 12 months.  -     Uric acid  Hyperlipidemia, unspecified hyperlipidemia type  Has next appt with cardio in 04/2017. No changes in current management. F/U in 6-12 months.  -     simvastatin (ZOCOR) 40 MG tablet; Take 1 tablet (40 mg total) by mouth at bedtime.   Diabetic peripheral neuropathy associated with type 2 diabetes mellitus ([place])  We discussed treatment options, he  prefers to hold on adding medications (Cymbalta or Lyrica) or increasing dose of Amitriptyline (prescribed for  insomnia). Foot care discussed. Continue following with podiatrists.    -Mr. [name] was advised to return sooner than planned today if new concerns arise.       Betty G. Martinique, MD  [facility]. [place] office.

## 2017-03-09 ENCOUNTER — Encounter: Payer: Self-pay | Admitting: Family Medicine

## 2017-03-09 ENCOUNTER — Ambulatory Visit (INDEPENDENT_AMBULATORY_CARE_PROVIDER_SITE_OTHER): Payer: Medicare Other | Admitting: Family Medicine

## 2017-03-09 VITALS — BP 130/90 | HR 74 | Resp 16 | Ht 68.0 in | Wt 304.5 lb

## 2017-03-09 DIAGNOSIS — E1165 Type 2 diabetes mellitus with hyperglycemia: Secondary | ICD-10-CM | POA: Diagnosis not present

## 2017-03-09 DIAGNOSIS — I10 Essential (primary) hypertension: Secondary | ICD-10-CM

## 2017-03-09 DIAGNOSIS — E1151 Type 2 diabetes mellitus with diabetic peripheral angiopathy without gangrene: Secondary | ICD-10-CM | POA: Diagnosis not present

## 2017-03-09 DIAGNOSIS — E785 Hyperlipidemia, unspecified: Secondary | ICD-10-CM | POA: Diagnosis not present

## 2017-03-09 DIAGNOSIS — E669 Obesity, unspecified: Secondary | ICD-10-CM

## 2017-03-09 DIAGNOSIS — I259 Chronic ischemic heart disease, unspecified: Secondary | ICD-10-CM

## 2017-03-09 DIAGNOSIS — M109 Gout, unspecified: Secondary | ICD-10-CM

## 2017-03-09 DIAGNOSIS — IMO0001 Reserved for inherently not codable concepts without codable children: Secondary | ICD-10-CM

## 2017-03-09 DIAGNOSIS — IMO0002 Reserved for concepts with insufficient information to code with codable children: Secondary | ICD-10-CM

## 2017-03-09 DIAGNOSIS — E1142 Type 2 diabetes mellitus with diabetic polyneuropathy: Secondary | ICD-10-CM

## 2017-03-09 DIAGNOSIS — Z6841 Body Mass Index (BMI) 40.0 and over, adult: Secondary | ICD-10-CM | POA: Diagnosis not present

## 2017-03-09 LAB — URIC ACID: URIC ACID, SERUM: 7.1 mg/dL (ref 4.0–7.8)

## 2017-03-09 LAB — HEMOGLOBIN A1C: HEMOGLOBIN A1C: 7.7 % — AB (ref 4.6–6.5)

## 2017-03-09 MED ORDER — HYDRALAZINE HCL 50 MG PO TABS: 50.0000 mg | ORAL_TABLET | Freq: Three times a day (TID) | ORAL | 2 refills | Status: DC

## 2017-03-09 MED ORDER — SIMVASTATIN 40 MG PO TABS: 40.0000 mg | ORAL_TABLET | Freq: Every day | ORAL | 3 refills | Status: DC

## 2017-03-09 MED ORDER — ISOSORBIDE MONONITRATE ER 60 MG PO TB24: 60.0000 mg | ORAL_TABLET | Freq: Every day | ORAL | 2 refills | Status: DC

## 2017-03-09 NOTE — Patient Instructions (Addendum)
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 If a new problem present, please set up appointment sooner than planned today.

## 2017-03-13 ENCOUNTER — Other Ambulatory Visit: Payer: Self-pay

## 2017-03-13 MED ORDER — INSULIN ASPART 100 UNIT/ML FLEXPEN: 25.0000 [IU] | PEN_INJECTOR | Freq: Every day | SUBCUTANEOUS | 5 refills | Status: DC | PRN

## 2017-03-14 LAB — FRUCTOSAMINE: Fructosamine: 282 umol/L — ABNORMAL HIGH (ref 190–270)

## 2017-03-27 DIAGNOSIS — E559 Vitamin D deficiency, unspecified: Secondary | ICD-10-CM | POA: Diagnosis not present

## 2017-03-27 DIAGNOSIS — N184 Chronic kidney disease, stage 4 (severe): Secondary | ICD-10-CM | POA: Diagnosis not present

## 2017-04-02 DIAGNOSIS — N184 Chronic kidney disease, stage 4 (severe): Secondary | ICD-10-CM | POA: Diagnosis not present

## 2017-04-02 DIAGNOSIS — E1140 Type 2 diabetes mellitus with diabetic neuropathy, unspecified: Secondary | ICD-10-CM | POA: Diagnosis not present

## 2017-04-02 DIAGNOSIS — N179 Acute kidney failure, unspecified: Secondary | ICD-10-CM | POA: Diagnosis not present

## 2017-04-02 DIAGNOSIS — E559 Vitamin D deficiency, unspecified: Secondary | ICD-10-CM | POA: Diagnosis not present

## 2017-04-02 DIAGNOSIS — D649 Anemia, unspecified: Secondary | ICD-10-CM | POA: Diagnosis not present

## 2017-04-02 DIAGNOSIS — I2510 Atherosclerotic heart disease of native coronary artery without angina pectoris: Secondary | ICD-10-CM | POA: Diagnosis not present

## 2017-04-18 DIAGNOSIS — I10 Essential (primary) hypertension: Secondary | ICD-10-CM | POA: Diagnosis not present

## 2017-04-18 DIAGNOSIS — G4733 Obstructive sleep apnea (adult) (pediatric): Secondary | ICD-10-CM | POA: Diagnosis not present

## 2017-05-04 ENCOUNTER — Other Ambulatory Visit: Payer: Self-pay | Admitting: Family Medicine

## 2017-05-25 DIAGNOSIS — Z79899 Other long term (current) drug therapy: Secondary | ICD-10-CM | POA: Diagnosis not present

## 2017-05-25 DIAGNOSIS — G4700 Insomnia, unspecified: Secondary | ICD-10-CM | POA: Diagnosis not present

## 2017-05-25 DIAGNOSIS — G8929 Other chronic pain: Secondary | ICD-10-CM | POA: Diagnosis not present

## 2017-05-25 DIAGNOSIS — M47896 Other spondylosis, lumbar region: Secondary | ICD-10-CM | POA: Diagnosis not present

## 2017-05-25 DIAGNOSIS — H66002 Acute suppurative otitis media without spontaneous rupture of ear drum, left ear: Secondary | ICD-10-CM | POA: Diagnosis not present

## 2017-05-29 DIAGNOSIS — H6502 Acute serous otitis media, left ear: Secondary | ICD-10-CM | POA: Diagnosis not present

## 2017-05-29 DIAGNOSIS — J342 Deviated nasal septum: Secondary | ICD-10-CM | POA: Diagnosis not present

## 2017-05-29 DIAGNOSIS — R0981 Nasal congestion: Secondary | ICD-10-CM | POA: Diagnosis not present

## 2017-05-29 DIAGNOSIS — Z23 Encounter for immunization: Secondary | ICD-10-CM | POA: Diagnosis not present

## 2017-06-05 ENCOUNTER — Telehealth: Payer: Self-pay | Admitting: *Deleted

## 2017-06-05 MED ORDER — INSULIN ASPART 100 UNIT/ML FLEXPEN: 25.0000 [IU] | PEN_INJECTOR | Freq: Every day | SUBCUTANEOUS | 1 refills | Status: DC | PRN

## 2017-06-05 NOTE — Telephone Encounter (Signed)
Patient called requested novolog to be refilled to [facility], patient wants the nurse to return his call. Patient states he needs a 3 month supply and he only has 2 pins left now. Please advise

## 2017-06-05 NOTE — Telephone Encounter (Signed)
I left a voicemail for patient letting him know that a 3 month supply with 1 refill (6 months total) has been sent into the pharmacy for him & to call back with any questions.

## 2017-06-06 ENCOUNTER — Telehealth: Payer: Self-pay | Admitting: Family Medicine

## 2017-06-06 MED ORDER — INSULIN ASPART 100 UNIT/ML FLEXPEN: 25.0000 [IU] | PEN_INJECTOR | Freq: Three times a day (TID) | SUBCUTANEOUS | 1 refills | Status: DC

## 2017-06-06 NOTE — Telephone Encounter (Signed)
Rx sent in & directions corrected.  Thank you!

## 2017-06-06 NOTE — Telephone Encounter (Signed)
Pt states he cannot pick up his insulin aspart (NOVOLOG FLEXPEN) 100 UNIT/ML FlexPen  Because the directions are wrong. Pt states actually does 25 units  TID. So according to the previous Rx, it appears pt should have enough left.  But pt states he only has 50 units left (1/2 pen) Pt will be out tomorrow. So would like today please.  [place], [place]

## 2017-06-21 ENCOUNTER — Other Ambulatory Visit: Payer: Self-pay | Admitting: Family Medicine

## 2017-06-29 DIAGNOSIS — H6502 Acute serous otitis media, left ear: Secondary | ICD-10-CM | POA: Diagnosis not present

## 2017-06-29 DIAGNOSIS — H903 Sensorineural hearing loss, bilateral: Secondary | ICD-10-CM | POA: Diagnosis not present

## 2017-08-09 NOTE — Progress Notes (Signed)
HPI:   Kent Osborne is a 66 y.o. male, who is here today for 5 months follow up.   He was last seen on 03/09/17.  HTN: Currently he is on Hydralazine 50 mg bid, Imdur 60 mg daily, and Coreg 6.25 mg bid. He has not taken his meds today. BP readings at home: "Runing pretty good": 140-150/50's.  Denies headache, visual changes, chest pain, dyspnea, palpitation, claudication, focal weakness, or worsening edema.  Diabetes Mellitus II:  Chronic problem. Last eye exam: Less than a year, next appt 09/03/17.   Currently on Lantus 45 U bid, Glipizide 10 mg daily,and Novolog sliding scale (around 26 U daily) Problem has been stable. Checking BS's : 80's,100-130's FG. Around lunch time 120's and at night 150's.  Hypoglycemia: None < 70. In the 70's If he skips meals.  He is tolerating medications well. He denies abdominal pain, nausea, vomiting, polydipsia, polyuria, or polyphagia. Stable numbness and burning.   CKD IV, he follows with nephrologist. Next appt 09/04/17. Cardiologist 10/03/17.  He is on Torsemide 20 mg bid and Zaroxolyn 2.5 mg daily prn.    Lab Results  Component Value Date   CREATININE 3.63 (H) 11/03/2016   BUN 96 (H) 11/03/2016   NA 134 (L) 11/03/2016   K 3.5 11/03/2016   CL 93 (L) 11/03/2016   CO2 27 11/03/2016    Lab Results  Component Value Date   HGBA1C 7.7 (H) 03/09/2017   Lab Results  Component Value Date   MICROALBUR 14.3 (H) 11/02/2015     Hyperlipidemia:  Currently on Simvastatin 40 mg daily. Following a low fat diet: Yes.  He has not noted side effects with medication.  Lab Results  Component Value Date   CHOL 104 10/30/2016   HDL 24.90 (L) 10/30/2016   LDLCALC 45 10/30/2016   LDLDIRECT 80.7 05/05/2010   TRIG 170.0 (H) 10/30/2016   CHOLHDL 4 10/30/2016   Elevated transaminases. He denies high alcohol consumption.  Lab Results  Component Value Date   ALT 19 10/30/2016   AST 43 (H) 10/30/2016   ALKPHOS 44  10/30/2016   BILITOT 0.9 10/30/2016    He is not exercising regularly. He has not been consistent with a healthy diet for the past couple months due to the holidays.  Review of Systems  Constitutional: Negative for activity change, appetite change, fatigue and fever.  HENT: Negative for mouth sores, nosebleeds, sore throat and trouble swallowing.   Eyes: Negative for redness and visual disturbance.  Respiratory: Negative for apnea, cough, shortness of breath and wheezing.   Cardiovascular: Positive for leg swelling. Negative for chest pain and palpitations.  Gastrointestinal: Negative for abdominal pain, nausea and vomiting.  Endocrine: Negative for polydipsia, polyphagia and polyuria.  Genitourinary: Negative for decreased urine volume, dysuria and hematuria.  Musculoskeletal: Positive for arthralgias. Negative for myalgias.  Skin: Negative for rash and wound.  Allergic/Immunologic: Positive for environmental allergies.  Neurological: Positive for numbness. Negative for dizziness, syncope, weakness and headaches.  Psychiatric/Behavioral: Negative for confusion. The patient is not nervous/anxious.       Current Outpatient Medications on File Prior to Visit  Medication Sig Dispense Refill  . albuterol (PROVENTIL HFA;VENTOLIN HFA) 108 (90 Base) MCG/ACT inhaler Inhale 2 puffs into the lungs every 6 (six) hours as needed for wheezing or shortness of breath. 1 Inhaler 0  . allopurinol (ZYLOPRIM) 300 MG tablet TAKE 1/2 (ONE-HALF) TABLET BY MOUTH ONCE DAILY 45 tablet 1  . amitriptyline (ELAVIL) 10 MG  tablet Take 10 mg by mouth.    Marland Kitchen aspirin EC 81 MG tablet Take 81 mg by mouth daily.    . calcitRIOL (ROCALTROL) 0.25 MCG capsule Take 0.25 mcg by mouth daily.    . colchicine 0.6 MG tablet Take 1 tablet (0.6 mg total) by mouth daily as needed (for gout flares).    Marland Kitchen glipiZIDE (GLUCOTROL) 10 MG tablet TAKE 1 TABLET BY MOUTH ONCE DAILY BEFORE BREAKFAST 90 tablet 1  . guaiFENesin (MUCINEX) 600 MG  12 hr tablet Take 1 tablet (600 mg total) by mouth every 12 (twelve) hours as needed for to loosen phlegm. 30 tablet 0  . hydrALAZINE (APRESOLINE) 50 MG tablet Take 1 tablet (50 mg total) by mouth 3 (three) times daily. 90 tablet 2  . insulin aspart (NOVOLOG FLEXPEN) 100 UNIT/ML FlexPen Inject 25 Units into the skin 3 (three) times daily. Sliding scale 75 mL 1  . Insulin Glargine (LANTUS SOLOSTAR) 100 UNIT/ML Solostar Pen Inject 45 Units into the skin 2 (two) times daily. 90 mL 3  . isosorbide mononitrate (IMDUR) 60 MG 24 hr tablet Take 1 tablet (60 mg total) by mouth daily. 90 tablet 2  . magnesium oxide (MAG-OX) 400 MG tablet Take 400 mg by mouth daily.    . metolazone (ZAROXOLYN) 2.5 MG tablet Take 2.5 mg by mouth daily as needed (fluid).    . montelukast (SINGULAIR) 10 MG tablet Take 10 mg by mouth at bedtime.     . nitroGLYCERIN (NITROSTAT) 0.4 MG SL tablet Place 1 tablet (0.4 mg total) under the tongue every 5 (five) minutes as needed for chest pain. 30 tablet 12  . Oxycodone HCl 10 MG TABS Take 10 mg by mouth 4 (four) times daily as needed.    . prasugrel (EFFIENT) 10 MG TABS Take 10 mg by mouth daily.      . ranitidine (ZANTAC) 300 MG tablet TAKE ONE TABLET BY MOUTH AT BEDTIME 90 tablet 1  . simvastatin (ZOCOR) 40 MG tablet Take 1 tablet (40 mg total) by mouth at bedtime. 90 tablet 3  . torsemide (DEMADEX) 20 MG tablet TAKE ONE TABLET BY MOUTH TWICE DAILY 200 tablet 3  . Vitamin D, Ergocalciferol, (DRISDOL) 50000 units CAPS capsule Take 50,000 Units by mouth every 7 (seven) days.     No current facility-administered medications on file prior to visit.      Past Medical History:  Diagnosis Date  . Acute renal failure superimposed on chronic kidney disease ([place])    [name] 11/01/2016  . Anemia   . Arthritis    "back, knees" (11/01/2016)  . Asthma   . CAD (coronary artery disease) 12/31/07   [facility] stents- placed in the circumflex and LAD  . Chronic bronchitis ([place])   .  History of blood transfusion 10/30/2016   "related to anemia"  . History of gout   . History of kidney stones   . Hyperlipidemia   . Hypertension   . NSTEMI (non-ST elevated myocardial infarction) ([place]) 10/30/2016  . OA (osteoarthritis)   . Obese   . On home oxygen therapy    "2L w/CPAP" (11/01/2016)  . OSA on CPAP   . Psoriatic arthritis ([place])   . Type II diabetes mellitus ([facility])    No Known Allergies  Social History   Socioeconomic History  . Marital status: Divorced    Spouse name: None  . Number of children: None  . Years of education: None  . Highest education level: None  Social Needs  .  Financial resource strain: None  . Food insecurity - worry: None  . Food insecurity - inability: None  . Transportation needs - medical: None  . Transportation needs - non-medical: None  Occupational History  . None  Tobacco Use  . Smoking status: Never Smoker  . Smokeless tobacco: Never Used  Substance and Sexual Activity  . Alcohol use: Yes    Comment: 11/01/2016 "nothing in years"  . Drug use: No  . Sexual activity: Yes  Other Topics Concern  . None  Social History Narrative  . None    Vitals:   08/10/17 0828  BP: (!) 154/88  Pulse: 91  Resp: 16  Temp: 98.5 F (36.9 C)  SpO2: 96%   Body mass index is 48.39 kg/m.   Wt Readings from Last 3 Encounters:  08/10/17 (!) 318 lb 4 oz (144.4 kg)  03/09/17 (!) 304 lb 8 oz (138.1 kg)  11/10/16 280 lb 4 oz (127.1 kg)     Physical Exam  Nursing note and vitals reviewed. Constitutional: He is oriented to person, place, and time. He appears well-developed. No distress.  HENT:  Head: Normocephalic and atraumatic.  Mouth/Throat: Oropharynx is clear and moist and mucous membranes are normal.  Eyes: Conjunctivae are normal. Pupils are equal, round, and reactive to light.  Neck: No thyroid mass present.  Cardiovascular: Normal rate and regular rhythm.  Occasional extrasystoles are present.  No murmur heard. Pulses:       Dorsalis pedis pulses are 2+ on the right side, and 2+ on the left side.  Respiratory: Effort normal and breath sounds normal. No respiratory distress.  GI: Soft. He exhibits no mass. There is no tenderness.  Musculoskeletal: He exhibits edema (2+ pitting LE edema, bilateral.). He exhibits no tenderness.  Lymphadenopathy:    He has no cervical adenopathy.  Neurological: He is alert and oriented to person, place, and time. He has normal strength.  Skin: Skin is warm. No rash noted. No erythema.  Psychiatric: He has a normal mood and affect. Cognition and memory are normal.  Well groomed, good eye contact.   Diabetic Foot Exam - Simple   Simple Foot Form Diabetic Foot exam was performed with the following findings:  Yes 08/10/2017  9:38 AM  Visual Inspection See comments:  Yes Sensation Testing See comments:  Yes Pulse Check See comments:  Yes Comments Monofilament decreased bilateral. Peripheral pulses present (DP). No ulcers/excoriations  + calluses No hypertrophic/long toenails.       ASSESSMENT AND PLAN:   Kent Osborne was seen today for 5 months follow-up.   Diagnoses and all orders for this visit:  Lab Results  Component Value Date   HGBA1C 6.5 08/10/2017   Lab Results  Component Value Date   CHOL 98 08/10/2017   HDL 25.50 (L) 08/10/2017   LDLCALC 33 08/10/2017   LDLDIRECT 80.7 05/05/2010   TRIG 196.0 (H) 08/10/2017   CHOLHDL 4 08/10/2017    Diabetic peripheral neuropathy associated with type 2 diabetes mellitus ([place])  HgA1C pending. No changes in current management,will adjust meds depending on HgA1C. Caution with hypoglycemia advised, avoid skipping meals.  Regular exercise and healthy diet with avoidance of added sugar food intake is an important part of treatment and recommended. Annual eye exam, periodic dental and foot care recommended. F/U in 5 months  -     Hemoglobin A1c -     Fructosamine  Essential hypertension  Not well  controlled. Possible complications of elevated BP discussed. Hydralazine 50  mg increased from bid to tid. Rest unchanged. He has appt coming to see his nephrologist (08/2017) and cardiologist (09/2017),so I will see him back in 4-5 months.  Hyperlipidemia, unspecified hyperlipidemia type  No changes in current management, will follow labs done today and will give further recommendations accordingly. F/U in 6-12 months.  -     Lipid panel  Elevated AST (SGOT)  Denies alcohol abuse. Possible causes dicussed. ? Fatty liver. We will continue following.  -     Hepatic function panel  Morbid obesity with BMI of 45.0-49.9, adult ([place])  Gained a few Lb since his last OV. We discussed benefits of wt loss as well as adverse effects of obesity. Consistency with healthy diet and physical activity recommended. He has difficulty with regular exercise due to LE OA, low impact exercise or upper extremities ROM exercises 10 min at the time a couple times per day may help.  He is reporting Flu vaccination at his ENT office.     -Mr. [name] was advised to return sooner than planned today if new concerns arise.       Aniket Paye G. Martinique, MD  [facility]. [place] office.

## 2017-08-10 ENCOUNTER — Encounter: Payer: Self-pay | Admitting: Family Medicine

## 2017-08-10 ENCOUNTER — Ambulatory Visit (INDEPENDENT_AMBULATORY_CARE_PROVIDER_SITE_OTHER): Payer: Medicare Other | Admitting: Family Medicine

## 2017-08-10 VITALS — BP 154/88 | HR 91 | Temp 98.5°F | Resp 16 | Ht 68.0 in | Wt 318.2 lb

## 2017-08-10 DIAGNOSIS — E1142 Type 2 diabetes mellitus with diabetic polyneuropathy: Secondary | ICD-10-CM | POA: Diagnosis not present

## 2017-08-10 DIAGNOSIS — I10 Essential (primary) hypertension: Secondary | ICD-10-CM | POA: Diagnosis not present

## 2017-08-10 DIAGNOSIS — Z6841 Body Mass Index (BMI) 40.0 and over, adult: Secondary | ICD-10-CM | POA: Diagnosis not present

## 2017-08-10 DIAGNOSIS — I259 Chronic ischemic heart disease, unspecified: Secondary | ICD-10-CM

## 2017-08-10 DIAGNOSIS — E785 Hyperlipidemia, unspecified: Secondary | ICD-10-CM | POA: Diagnosis not present

## 2017-08-10 DIAGNOSIS — R7401 Elevation of levels of liver transaminase levels: Secondary | ICD-10-CM

## 2017-08-10 DIAGNOSIS — R740 Nonspecific elevation of levels of transaminase and lactic acid dehydrogenase [LDH]: Secondary | ICD-10-CM

## 2017-08-10 LAB — LIPID PANEL
CHOL/HDL RATIO: 4
Cholesterol: 98 mg/dL (ref 0–200)
HDL: 25.5 mg/dL — ABNORMAL LOW (ref >39.00)
LDL CALC: 33 mg/dL (ref 0–99)
NonHDL: 72.68
TRIGLYCERIDES: 196 mg/dL — AB (ref 0.0–149.0)
VLDL: 39.2 mg/dL (ref 0.0–40.0)

## 2017-08-10 LAB — HEPATIC FUNCTION PANEL
ALT: 12 U/L (ref 0–53)
AST: 9 U/L (ref 0–37)
Albumin: 3.4 g/dL — ABNORMAL LOW (ref 3.5–5.2)
Alkaline Phosphatase: 69 U/L (ref 39–117)
BILIRUBIN DIRECT: 0.1 mg/dL (ref 0.0–0.3)
BILIRUBIN TOTAL: 0.6 mg/dL (ref 0.2–1.2)
TOTAL PROTEIN: 5.9 g/dL — AB (ref 6.0–8.3)

## 2017-08-10 LAB — HEMOGLOBIN A1C: Hgb A1c MFr Bld: 6.5 % (ref 4.6–6.5)

## 2017-08-10 NOTE — Patient Instructions (Signed)
A few things to remember from today's visit:   Diabetic peripheral neuropathy associated with type 2 diabetes mellitus ([place])  Essential hypertension  Hyperlipidemia, unspecified hyperlipidemia type  Elevated ALT measurement  Hydralazine to take 3 times per day.  Monitor blood pressure at home.  Goal 130/80.  Please be sure medication list is accurate. If a new problem present, please set up appointment sooner than planned today.

## 2017-08-13 LAB — FRUCTOSAMINE: FRUCTOSAMINE: 222 umol/L (ref 190–270)

## 2017-08-17 ENCOUNTER — Other Ambulatory Visit: Payer: Self-pay | Admitting: Family Medicine

## 2017-08-20 NOTE — Progress Notes (Signed)
Left message to give clinic a call back for labs.

## 2017-08-29 ENCOUNTER — Other Ambulatory Visit: Payer: Self-pay | Admitting: Family Medicine

## 2017-08-29 DIAGNOSIS — R1012 Left upper quadrant pain: Secondary | ICD-10-CM

## 2017-08-31 DIAGNOSIS — G4700 Insomnia, unspecified: Secondary | ICD-10-CM | POA: Diagnosis not present

## 2017-08-31 DIAGNOSIS — M47896 Other spondylosis, lumbar region: Secondary | ICD-10-CM | POA: Diagnosis not present

## 2017-08-31 DIAGNOSIS — Z79899 Other long term (current) drug therapy: Secondary | ICD-10-CM | POA: Diagnosis not present

## 2017-08-31 DIAGNOSIS — G8929 Other chronic pain: Secondary | ICD-10-CM | POA: Diagnosis not present

## 2017-09-04 DIAGNOSIS — E559 Vitamin D deficiency, unspecified: Secondary | ICD-10-CM | POA: Diagnosis not present

## 2017-09-04 DIAGNOSIS — I2510 Atherosclerotic heart disease of native coronary artery without angina pectoris: Secondary | ICD-10-CM | POA: Diagnosis not present

## 2017-09-04 DIAGNOSIS — N184 Chronic kidney disease, stage 4 (severe): Secondary | ICD-10-CM | POA: Diagnosis not present

## 2017-09-04 DIAGNOSIS — D649 Anemia, unspecified: Secondary | ICD-10-CM | POA: Diagnosis not present

## 2017-09-04 DIAGNOSIS — N179 Acute kidney failure, unspecified: Secondary | ICD-10-CM | POA: Diagnosis not present

## 2017-09-04 DIAGNOSIS — E1140 Type 2 diabetes mellitus with diabetic neuropathy, unspecified: Secondary | ICD-10-CM | POA: Diagnosis not present

## 2017-10-03 DIAGNOSIS — I2510 Atherosclerotic heart disease of native coronary artery without angina pectoris: Secondary | ICD-10-CM | POA: Diagnosis not present

## 2017-10-03 DIAGNOSIS — N184 Chronic kidney disease, stage 4 (severe): Secondary | ICD-10-CM | POA: Diagnosis not present

## 2017-10-03 DIAGNOSIS — E785 Hyperlipidemia, unspecified: Secondary | ICD-10-CM | POA: Diagnosis not present

## 2017-10-03 DIAGNOSIS — I872 Venous insufficiency (chronic) (peripheral): Secondary | ICD-10-CM | POA: Diagnosis not present

## 2017-10-03 DIAGNOSIS — I10 Essential (primary) hypertension: Secondary | ICD-10-CM | POA: Diagnosis not present

## 2017-10-12 DIAGNOSIS — I2510 Atherosclerotic heart disease of native coronary artery without angina pectoris: Secondary | ICD-10-CM | POA: Diagnosis not present

## 2017-10-12 DIAGNOSIS — D649 Anemia, unspecified: Secondary | ICD-10-CM | POA: Diagnosis not present

## 2017-10-12 DIAGNOSIS — N179 Acute kidney failure, unspecified: Secondary | ICD-10-CM | POA: Diagnosis not present

## 2017-10-12 DIAGNOSIS — E559 Vitamin D deficiency, unspecified: Secondary | ICD-10-CM | POA: Diagnosis not present

## 2017-10-12 DIAGNOSIS — E1140 Type 2 diabetes mellitus with diabetic neuropathy, unspecified: Secondary | ICD-10-CM | POA: Diagnosis not present

## 2017-10-12 DIAGNOSIS — N184 Chronic kidney disease, stage 4 (severe): Secondary | ICD-10-CM | POA: Diagnosis not present

## 2017-11-06 ENCOUNTER — Other Ambulatory Visit: Payer: Self-pay | Admitting: Family Medicine

## 2017-11-07 ENCOUNTER — Telehealth: Payer: Self-pay | Admitting: *Deleted

## 2017-11-07 NOTE — Telephone Encounter (Signed)
Lantus Solostar Inj 100 unit/ml pen Note from pharmacy: Non formulary drug on PT insurance, they cover Nancee Liter, Tyler Aas, [place]

## 2017-11-08 ENCOUNTER — Other Ambulatory Visit: Payer: Self-pay | Admitting: Family Medicine

## 2017-11-09 ENCOUNTER — Other Ambulatory Visit: Payer: Self-pay | Admitting: *Deleted

## 2017-11-09 ENCOUNTER — Other Ambulatory Visit: Payer: Self-pay | Admitting: Family Medicine

## 2017-11-09 DIAGNOSIS — N184 Chronic kidney disease, stage 4 (severe): Secondary | ICD-10-CM | POA: Diagnosis not present

## 2017-11-09 MED ORDER — INSULIN DEGLUDEC 100 UNIT/ML ~~LOC~~ SOPN: 50.0000 [IU] | PEN_INJECTOR | Freq: Every day | SUBCUTANEOUS | 2 refills | Status: DC

## 2017-11-09 NOTE — Telephone Encounter (Signed)
I sent Rx for Kent Osborne, which last longer than Lantus. So dose adjusted, he will take Tresiba 50 U daily at night, we can titrate according to BS's and/ot HgA1C. Caution with low BS's given the fact he is also on short acting insulin.  Thanks, BJ

## 2017-11-13 DIAGNOSIS — N419 Inflammatory disease of prostate, unspecified: Secondary | ICD-10-CM | POA: Diagnosis not present

## 2017-11-13 DIAGNOSIS — R310 Gross hematuria: Secondary | ICD-10-CM | POA: Diagnosis not present

## 2017-11-13 NOTE — Telephone Encounter (Signed)
Spoke with patient about change in medication due to insurance not covering Lantus. Patient verbalized understanding. Patient stated that he would contact insurance about Lantus.

## 2017-11-14 ENCOUNTER — Telehealth: Payer: Self-pay | Admitting: *Deleted

## 2017-11-14 ENCOUNTER — Other Ambulatory Visit: Payer: Self-pay | Admitting: Family Medicine

## 2017-11-14 DIAGNOSIS — R310 Gross hematuria: Secondary | ICD-10-CM

## 2017-11-14 NOTE — Telephone Encounter (Signed)
Copied from [place] [id number]. Topic: Referral - Request >> Nov 13, 2017  6:16 PM Oliver Pila B wrote: Reason for CRM: pt called to ask for a referral for a specialist b/c recently he's been having blood in his urine, call pt to advise

## 2017-11-14 NOTE — Telephone Encounter (Signed)
Referral was placed and Mr Apachito was already informed.  Kemiya Batdorf Martinique, MD

## 2017-11-14 NOTE — Telephone Encounter (Signed)
Spoke with patient, informed him per Dr. Martinique that referral was placed for urology and that their office would be calling him to schedule an appointment. Patient verbalized understanding.

## 2017-11-16 ENCOUNTER — Other Ambulatory Visit: Payer: Self-pay

## 2017-11-16 ENCOUNTER — Emergency Department (HOSPITAL_COMMUNITY)
Admission: EM | Admit: 2017-11-16 | Discharge: 2017-11-17 | Disposition: A | Discharge status: Home / Self Care | Payer: Medicare Other | Attending: Emergency Medicine | Admitting: Emergency Medicine

## 2017-11-16 ENCOUNTER — Encounter (HOSPITAL_COMMUNITY): Payer: Self-pay | Admitting: *Deleted

## 2017-11-16 DIAGNOSIS — R319 Hematuria, unspecified: Secondary | ICD-10-CM | POA: Insufficient documentation

## 2017-11-16 DIAGNOSIS — Z5321 Procedure and treatment not carried out due to patient leaving prior to being seen by health care provider: Secondary | ICD-10-CM | POA: Insufficient documentation

## 2017-11-16 LAB — URINALYSIS, ROUTINE W REFLEX MICROSCOPIC

## 2017-11-16 LAB — URINALYSIS, MICROSCOPIC (REFLEX): SQUAMOUS EPITHELIAL / LPF: NONE SEEN

## 2017-11-16 NOTE — ED Triage Notes (Signed)
Pt in c/o hematuria x 1 wk, pt seen by PCP earlier this week and states, "they done a culture and it came back with no growth. It is a lot of blood though not just a little." pt denies pain, denies dysuria, pt A&O x4

## 2017-11-16 NOTE — ED Notes (Signed)
Pt did not want to stay any longer. Pt has decided to leave.

## 2017-11-18 ENCOUNTER — Emergency Department (HOSPITAL_COMMUNITY)
Admission: EM | Admit: 2017-11-18 | Discharge: 2017-11-18 | Disposition: A | Discharge status: Home / Self Care | Payer: Medicare Other | Attending: Emergency Medicine | Admitting: Emergency Medicine

## 2017-11-18 ENCOUNTER — Encounter (HOSPITAL_COMMUNITY): Payer: Self-pay | Admitting: Emergency Medicine

## 2017-11-18 DIAGNOSIS — E1122 Type 2 diabetes mellitus with diabetic chronic kidney disease: Secondary | ICD-10-CM | POA: Diagnosis not present

## 2017-11-18 DIAGNOSIS — Z79899 Other long term (current) drug therapy: Secondary | ICD-10-CM | POA: Diagnosis not present

## 2017-11-18 DIAGNOSIS — R319 Hematuria, unspecified: Secondary | ICD-10-CM | POA: Diagnosis present

## 2017-11-18 DIAGNOSIS — Z7982 Long term (current) use of aspirin: Secondary | ICD-10-CM | POA: Insufficient documentation

## 2017-11-18 DIAGNOSIS — I2510 Atherosclerotic heart disease of native coronary artery without angina pectoris: Secondary | ICD-10-CM | POA: Insufficient documentation

## 2017-11-18 DIAGNOSIS — N184 Chronic kidney disease, stage 4 (severe): Secondary | ICD-10-CM | POA: Diagnosis not present

## 2017-11-18 DIAGNOSIS — Z794 Long term (current) use of insulin: Secondary | ICD-10-CM | POA: Insufficient documentation

## 2017-11-18 DIAGNOSIS — R310 Gross hematuria: Secondary | ICD-10-CM | POA: Diagnosis not present

## 2017-11-18 DIAGNOSIS — N390 Urinary tract infection, site not specified: Secondary | ICD-10-CM | POA: Insufficient documentation

## 2017-11-18 DIAGNOSIS — I129 Hypertensive chronic kidney disease with stage 1 through stage 4 chronic kidney disease, or unspecified chronic kidney disease: Secondary | ICD-10-CM | POA: Insufficient documentation

## 2017-11-18 DIAGNOSIS — J45909 Unspecified asthma, uncomplicated: Secondary | ICD-10-CM | POA: Diagnosis not present

## 2017-11-18 DIAGNOSIS — Z955 Presence of coronary angioplasty implant and graft: Secondary | ICD-10-CM | POA: Diagnosis not present

## 2017-11-18 DIAGNOSIS — I252 Old myocardial infarction: Secondary | ICD-10-CM | POA: Diagnosis not present

## 2017-11-18 LAB — URINALYSIS, ROUTINE W REFLEX MICROSCOPIC
BILIRUBIN URINE: NEGATIVE
Glucose, UA: 250 mg/dL — AB
KETONES UR: 15 mg/dL — AB
NITRITE: POSITIVE — AB
PH: 6.5 (ref 5.0–8.0)
Protein, ur: >300 mg/dL — AB
Specific Gravity, Urine: 1.015 (ref 1.005–1.030)

## 2017-11-18 LAB — CBC WITH DIFFERENTIAL/PLATELET
BASOS ABS: 0 10*3/uL (ref 0.0–0.1)
Basophils Relative: 0 %
EOS PCT: 1 %
Eosinophils Absolute: 0.1 10*3/uL (ref 0.0–0.7)
HEMATOCRIT: 29.2 % — AB (ref 39.0–52.0)
Hemoglobin: 9.4 g/dL — ABNORMAL LOW (ref 13.0–17.0)
LYMPHS ABS: 2.5 10*3/uL (ref 0.7–4.0)
LYMPHS PCT: 28 %
MCH: 30.2 pg (ref 26.0–34.0)
MCHC: 32.2 g/dL (ref 30.0–36.0)
MCV: 93.9 fL (ref 78.0–100.0)
MONO ABS: 0.5 10*3/uL (ref 0.1–1.0)
MONOS PCT: 6 %
NEUTROS ABS: 5.8 10*3/uL (ref 1.7–7.7)
Neutrophils Relative %: 65 %
PLATELETS: 158 10*3/uL (ref 150–400)
RBC: 3.11 MIL/uL — ABNORMAL LOW (ref 4.22–5.81)
RDW: 13.3 % (ref 11.5–15.5)
WBC: 8.9 10*3/uL (ref 4.0–10.5)

## 2017-11-18 LAB — COMPREHENSIVE METABOLIC PANEL
ALBUMIN: 3.3 g/dL — AB (ref 3.5–5.0)
ALT: 13 U/L — ABNORMAL LOW (ref 17–63)
ANION GAP: 10 (ref 5–15)
AST: 16 U/L (ref 15–41)
Alkaline Phosphatase: 65 U/L (ref 38–126)
BILIRUBIN TOTAL: 1.1 mg/dL (ref 0.3–1.2)
BUN: 60 mg/dL — AB (ref 6–20)
CHLORIDE: 105 mmol/L (ref 101–111)
CO2: 24 mmol/L (ref 22–32)
Calcium: 8.5 mg/dL — ABNORMAL LOW (ref 8.9–10.3)
Creatinine, Ser: 5.49 mg/dL — ABNORMAL HIGH (ref 0.61–1.24)
GFR calc Af Amer: 11 mL/min — ABNORMAL LOW (ref >60)
GFR calc non Af Amer: 10 mL/min — ABNORMAL LOW (ref >60)
GLUCOSE: 132 mg/dL — AB (ref 65–99)
POTASSIUM: 4.8 mmol/L (ref 3.5–5.1)
SODIUM: 139 mmol/L (ref 135–145)
Total Protein: 6.2 g/dL — ABNORMAL LOW (ref 6.5–8.1)

## 2017-11-18 LAB — URINALYSIS, MICROSCOPIC (REFLEX): Squamous Epithelial / LPF: NONE SEEN

## 2017-11-18 MED ORDER — CEFTRIAXONE SODIUM 1 G IJ SOLR: 1.0000 g | Freq: Once | INTRAMUSCULAR | Status: DC

## 2017-11-18 MED ORDER — SODIUM CHLORIDE 0.9 % IV SOLN
1.0000 g | Freq: Once | INTRAVENOUS | Status: DC
  Filled 2017-11-18: qty 10

## 2017-11-18 MED ORDER — SODIUM CHLORIDE 0.9 % IV SOLN
1.0000 g | Freq: Once | INTRAVENOUS | Status: AC
  Administered 2017-11-18: 1 g via INTRAVENOUS

## 2017-11-18 MED ORDER — CEPHALEXIN 500 MG PO CAPS: 500.0000 mg | ORAL_CAPSULE | Freq: Two times a day (BID) | ORAL | 0 refills | Status: AC

## 2017-11-18 MED ORDER — SODIUM CHLORIDE 0.9 % IV BOLUS
1000.0000 mL | Freq: Once | INTRAVENOUS | Status: AC
  Administered 2017-11-18: 1000 mL via INTRAVENOUS

## 2017-11-18 NOTE — Discharge Instructions (Addendum)
As discussed, make sure that you stay well-hydrated drinking enough fluids to keep your urine as clear as possible. Take your entire course of antibiotics even if you feel better.  Follow up with your primary care provider in 2 weeks to have a repeat urine analysis. Follow-up with urology if symptoms persist, they may need to perform a cystoscopy which involves a camera looking inside your bladder for any kind of mass that could be bleeding.  Return if symptoms worsen or new concerning symptoms in the meantime.

## 2017-11-18 NOTE — ED Notes (Signed)
urie cuulture not sent from triaged pt unable to go at present

## 2017-11-18 NOTE — ED Provider Notes (Signed)
[facility] EMERGENCY DEPARTMENT Provider Note   CSN: [id number] Arrival date & time: 11/18/17  1630     History   Chief Complaint Chief Complaint  Patient presents with  . Hematuria    HPI Kent Osborne is a 67 y.o. male with past medical history significant for anemia, asthma, CAD, hypertension, and STEMI, CKD stage IV, DM with neuropathy presenting with 1 week of gross hematuria.  Was seen at urgent care last week and states that he had a urine culture that was negative.  He was started on doxycycline and Flomax.  Patient has taken his antibiotics without relief of symptoms.  He did not take Flomax.  Denies any fever, chills, dysuria, nominal pain, or any other new symptoms.  He has been voiding completely without difficulties.  Reports that his only new symptom is gross hematuria.  HPI  Past Medical History:  Diagnosis Date  . Acute renal failure superimposed on chronic kidney disease ([place])    [name] 11/01/2016  . Anemia   . Arthritis    "back, knees" (11/01/2016)  . Asthma   . CAD (coronary artery disease) 12/31/07   [facility] stents- placed in the circumflex and LAD  . Chronic bronchitis ([place])   . History of blood transfusion 10/30/2016   "related to anemia"  . History of gout   . History of kidney stones   . Hyperlipidemia   . Hypertension   . NSTEMI (non-ST elevated myocardial infarction) ([place]) 10/30/2016  . OA (osteoarthritis)   . Obese   . On home oxygen therapy    "2L w/CPAP" (11/01/2016)  . OSA on CPAP   . Psoriatic arthritis ([place])   . Type II diabetes mellitus [facility])     Patient Active Problem List   Diagnosis Date Noted  . Elevated troponin 11/01/2016  . NSTEMI (non-ST elevated myocardial infarction) ([place]) 11/01/2016  . Acute renal failure superimposed on chronic kidney disease ([place]) 11/01/2016  . Diabetes mellitus with complication ([place]) 81/44/8185  . COPD (chronic obstructive pulmonary disease) ([place]) 11/01/2016  . OSA  (obstructive sleep apnea) 11/01/2016  . Symptomatic anemia 10/30/2016  . Morbid obesity with BMI of 45.0-49.9, adult ([place]) 04/29/2016  . Gout, arthropathy 04/27/2016  . Diabetic peripheral neuropathy associated with type 2 diabetes mellitus ([place]) 08/20/2014  . Type II diabetes mellitus with circulatory disorder, uncontrolled 07/09/2014  . Renal insufficiency 02/29/2012  . HEMATURIA 07/27/2010  . GOUT, UNSPECIFIED 07/05/2010  . BACK PAIN, LUMBAR 04/18/2010  . Backache 01/06/2010  . GENERALIZED OSTEOARTHROSIS INVOLVING HAND 07/20/2009  . Chronic kidney disease (CKD), stage IV (severe) ([place]) 04/12/2009  . PERIPHERAL EDEMA 12/31/2008  . Asthma with acute exacerbation 10/08/2008  . Osteoarthritis 07/28/2008  . Hyperlipemia 01/06/2008  . Coronary atherosclerosis 01/06/2008  . Chronic ischemic heart disease 12/31/2007  . Essential hypertension 02/21/2007  . CHOLELITHIASIS 02/21/2007  . NEPHROLITHIASIS, HX OF 02/21/2007    Past Surgical History:  Procedure Laterality Date  . CORONARY ANGIOPLASTY WITH STENT PLACEMENT  2009   "3 stents"        Home Medications    Prior to Admission medications   Medication Sig Start Date End Date Taking? Authorizing Provider  albuterol (PROVENTIL HFA;VENTOLIN HFA) 108 (90 Base) MCG/ACT inhaler Inhale 2 puffs into the lungs every 6 (six) hours as needed for wheezing or shortness of breath. 10/30/16   Martinique, Betty G, MD  allopurinol (ZYLOPRIM) 300 MG tablet TAKE 1/2 (ONE-HALF) TABLET BY MOUTH ONCE DAILY 06/22/17   [place], Betty G, MD  amitriptyline (ELAVIL) 10 MG tablet Take 10 mg by mouth.    Arcedo, Perico, DO  aspirin EC 81 MG tablet Take 81 mg by mouth daily.    [provider]  calcitRIOL (ROCALTROL) 0.25 MCG capsule Take 0.25 mcg by mouth daily.    [provider]  carvedilol (COREG) 6.25 MG tablet TAKE 1 TABLET BY MOUTH TWICE DAILY WITH A MEAL 08/17/17   Martinique, Betty G, MD  cephALEXin (KEFLEX) 500 MG capsule Take 1 capsule (500  mg total) by mouth 2 (two) times daily for 7 days. 11/18/17 11/25/17  Avie Echevaria B, PA-C  colchicine 0.6 MG tablet Take 1 tablet (0.6 mg total) by mouth daily as needed (for gout flares). 11/03/16   Rai, Ripudeep K, MD  glipiZIDE (GLUCOTROL) 10 MG tablet TAKE 1 TABLET BY MOUTH ONCE DAILY BEFORE BREAKFAST 11/09/17   Martin[place], Betty G, MD  guaiFENesin (MUCINEX) 600 MG 12 hr tablet Take 1 tablet (600 mg total) by mouth every 12 (twelve) hours as needed for to loosen phlegm. 11/03/16   Rai, Vernelle Emerald, MD  hydrALAZINE (APRESOLINE) 50 MG tablet Take 1 tablet (50 mg total) by mouth 3 (three) times daily. 03/09/17   Martinique, Betty G, MD  insulin degludec (TRESIBA FLEXTOUCH) 100 UNIT/ML SOPN FlexTouch Pen Inject 0.5 mLs (50 Units total) into the skin daily at 10 pm. 11/09/17   [place], Betty G, MD  isosorbide mononitrate (IMDUR) 60 MG 24 hr tablet Take 1 tablet (60 mg total) by mouth daily. 03/09/17   [place], Betty G, MD  magnesium oxide (MAG-OX) 400 MG tablet Take 400 mg by mouth daily.    [provider]  metolazone (ZAROXOLYN) 2.5 MG tablet Take 2.5 mg by mouth daily as needed (fluid).    [provider]  montelukast (SINGULAIR) 10 MG tablet Take 10 mg by mouth at bedtime.     [provider]  nitroGLYCERIN (NITROSTAT) 0.4 MG SL tablet Place 1 tablet (0.4 mg total) under the tongue every 5 (five) minutes as needed for chest pain. 11/03/16   Rai, Ripudeep K, MD  NOVOLOG FLEXPEN 100 UNIT/ML FlexPen  INJECT 25 UNITS INTO THE SKIN THREE TIMES DAILY USING A SLIDING SCALE 11/09/17   [place], Betty G, MD  Oxycodone HCl 10 MG TABS Take 10 mg by mouth 4 (four) times daily as needed.    Arcedo, Perico, DO  prasugrel (EFFIENT) 10 MG TABS Take 10 mg by mouth daily.      [provider]  ranitidine (ZANTAC) 300 MG tablet TAKE 1 TABLET BY MOUTH AT BEDTIME 08/29/17   [place], Betty G, MD  simvastatin (ZOCOR) 40 MG tablet Take 1 tablet (40 mg total) by mouth at bedtime. 03/09/17   [place], Betty G,  MD  torsemide (DEMADEX) 20 MG tablet TAKE ONE TABLET BY MOUTH TWICE DAILY 08/10/14   Dorena Cookey, MD  Vitamin D, Ergocalciferol, (DRISDOL) 50000 units CAPS capsule Take 50,000 Units by mouth every 7 (seven) days.    [provider]    Family History Family History  Problem Relation Age of Onset  . Diabetes Other   . Hyperlipidemia Other   . Hypertension Other     Social History Social History   Tobacco Use  . Smoking status: Never Smoker  . Smokeless tobacco: Never Used  Substance Use Topics  . Alcohol use: Yes    Comment: 11/01/2016 "nothing in years"  . Drug use: No     Allergies   Patient has no known allergies.  Review of Systems Review of Systems  Constitutional: Negative for chills, diaphoresis, fatigue and fever.  Respiratory: Negative for cough, choking, chest tightness, shortness of breath, wheezing and stridor.   Cardiovascular: Negative for chest pain, palpitations and leg swelling.  Gastrointestinal: Negative for abdominal distention, abdominal pain, diarrhea, nausea and vomiting.  Genitourinary: Positive for hematuria. Negative for difficulty urinating, dysuria and flank pain.  Musculoskeletal: Negative for back pain.  Skin: Negative for color change, pallor and rash.  Neurological: Negative for dizziness, light-headedness and headaches.     Physical Exam Updated Vital Signs BP (!) 192/82   Pulse (!) 40   Temp 98.1 F (36.7 C) (Oral)   Resp 18   SpO2 98%   Physical Exam  Constitutional: He appears well-developed and well-nourished. No distress.  Well-appearing, afebrile nontoxic sitting comfortably in bed no acute distress.  HENT:  Head: Normocephalic and atraumatic.  Eyes: Conjunctivae and EOM are normal.  Neck: Neck supple.  Cardiovascular: Normal rate, regular rhythm and normal heart sounds.  No murmur heard. Pulmonary/Chest: Effort normal and breath sounds normal. No stridor. No respiratory distress. He has no wheezes. He has  no rales.  Abdominal: Soft. He exhibits no distension and no mass. There is no tenderness. There is no rebound and no guarding.  Musculoskeletal: Normal range of motion. He exhibits no edema.  Neurological: He is alert.  Skin: Skin is warm and dry. No rash noted. He is not diaphoretic. No erythema. No pallor.  Psychiatric: He has a normal mood and affect.  Nursing note and vitals reviewed.    ED Treatments / Results  Labs (all labs ordered are listed, but only abnormal results are displayed) Labs Reviewed  URINALYSIS, ROUTINE W REFLEX MICROSCOPIC - Abnormal; Notable for the following components:      Result Value   Color, Urine RED (*)    APPearance CLOUDY (*)    Glucose, UA 250 (*)    Hgb urine dipstick LARGE (*)    Ketones, ur 15 (*)    Protein, ur >300 (*)    Nitrite POSITIVE (*)    Leukocytes, UA MODERATE (*)    All other components within normal limits  COMPREHENSIVE METABOLIC PANEL - Abnormal; Notable for the following components:   Glucose, Bld 132 (*)    BUN 60 (*)    Creatinine, Ser 5.49 (*)    Calcium 8.5 (*)    Total Protein 6.2 (*)    Albumin 3.3 (*)    ALT 13 (*)    GFR calc non Af Amer 10 (*)    GFR calc Af Amer 11 (*)    All other components within normal limits  CBC WITH DIFFERENTIAL/PLATELET - Abnormal; Notable for the following components:   RBC 3.11 (*)    Hemoglobin 9.4 (*)    HCT 29.2 (*)    All other components within normal limits  URINALYSIS, MICROSCOPIC (REFLEX) - Abnormal; Notable for the following components:   Bacteria, UA MANY (*)    All other components within normal limits  URINE CULTURE   Hemoglobin  Date Value Ref Range Status  11/18/2017 9.4 (L) 13.0 - 17.0 g/dL Final  11/03/2016 10.0 (L) 13.0 - 17.0 g/dL Final  11/02/2016 10.0 (L) 13.0 - 17.0 g/dL Final  11/01/2016 10.7 (L) 13.0 - 17.0 g/dL Final   BUN  Date Value Ref Range Status  11/18/2017 60 (H) 6 - 20 mg/dL Final  11/03/2016 96 (H) 6 - 20 mg/dL Final  11/02/2016 97 (H) 6  -  20 mg/dL Final  11/01/2016 90 (H) 6 - 20 mg/dL Final   Creatinine, Ser  Date Value Ref Range Status  11/18/2017 5.49 (H) 0.61 - 1.24 mg/dL Final  11/03/2016 3.63 (H) 0.61 - 1.24 mg/dL Final  11/02/2016 4.23 (H) 0.61 - 1.24 mg/dL Final  11/01/2016 4.28 (H) 0.61 - 1.24 mg/dL Final     EKG None  Radiology No results found.  Procedures Procedures (including critical care time)  Medications Ordered in ED Medications  sodium chloride 0.9 % bolus 1,000 mL (1,000 mLs Intravenous New Bag/Given 11/18/17 1858)  cefTRIAXone (ROCEPHIN) 1 g in sodium chloride 0.9 % 100 mL IVPB (0 g Intravenous Stopped 11/18/17 2050)     Initial Impression / Assessment and Plan / ED Course  I have reviewed the triage vital signs and the nursing notes.  Pertinent labs & imaging results that were available during my care of the patient were reviewed by me and considered in my medical decision making (see chart for details).   Patient presents with 1 week of gross hematuria and no associated symptoms. Seen at UC a week ago and discharged with doxy. Negative UA.  Today his urine shows signs of UTI with many bacteria and Positive nitrite. Sent out for culture.  Patient was given dose of rocephin in ED. He is otherwise well-appearing, nontoxic and afebrile. No  CVA tenderness, voiding appropriately.  Patient's renal function is at baseline and has improved in recent times.  Although not in this system, patient reporting that he has had a recent creatinine over 5 and it overall has been improving.  Will discharge home with antibiotics and close follow-up with PCP for repeat urine analysis.  Patient was also provided with urology's information if symptoms persist.  Discussed return precautions and patient understood and agreed with plan. He had not taken his blood pressure medications today hence the elevated blood pressure in the emergency department.  Patient just took his medications prior to  discharge.  Final Clinical Impressions(s) / ED Diagnoses   Final diagnoses:  Lower urinary tract infectious disease  Gross hematuria    ED Discharge Orders        Ordered    cephALEXin (KEFLEX) 500 MG capsule  2 times daily     11/18/17 2037       Dossie Der 11/18/17 2052    Tanna Furry, MD 11/19/17 [phone]

## 2017-11-18 NOTE — ED Triage Notes (Signed)
Pt to ED c/o hematuria x 1 week - was here but left before being seen on Friday. Patient reports bright red blood in urine, denies any pain, but hx kidney stones. Pt went to urgent care, who did a urine culture on 3/26 and reported no growth but gave patient doxycycline. Denies fevers/chills.

## 2017-11-19 LAB — URINE CULTURE: Culture: <10000 — AB

## 2017-11-21 ENCOUNTER — Other Ambulatory Visit: Payer: Self-pay | Admitting: Family Medicine

## 2017-11-26 DIAGNOSIS — G8929 Other chronic pain: Secondary | ICD-10-CM | POA: Diagnosis not present

## 2017-11-26 DIAGNOSIS — Z79899 Other long term (current) drug therapy: Secondary | ICD-10-CM | POA: Diagnosis not present

## 2017-11-26 DIAGNOSIS — G4700 Insomnia, unspecified: Secondary | ICD-10-CM | POA: Diagnosis not present

## 2017-11-26 DIAGNOSIS — M47896 Other spondylosis, lumbar region: Secondary | ICD-10-CM | POA: Diagnosis not present

## 2017-12-13 ENCOUNTER — Other Ambulatory Visit: Payer: Self-pay | Admitting: Family Medicine

## 2017-12-13 DIAGNOSIS — I10 Essential (primary) hypertension: Secondary | ICD-10-CM

## 2017-12-23 NOTE — Progress Notes (Addendum)
HPI:   Kent Osborne is a 67 y.o. male, who is here today to follow on recent ED visit.   He was seen in 07/2017. [facility], according to patient, he was diagnosed with UTI and doxycycline was prescribed.  He was evaluated in the ED on 11/18/17 because of persistent gross hematuria with no other urinary symptom.  He was referred to urologist on 11/14/17, he called to report gross hematuria on 11/14/17. He has not received information about appointment.   No dysuria, changes in urine stream or  changes in nocturia,1-2 times a night. Gross hematuria continues intermittently.  No dysuria, abdominal pain, back pain, nausea, vomiting, or changes in bowel habits. He has history of nephrolithiasis. No tobacco use. He states that he worked in a [employer], he is not aware of any specific exposure.  Renal US in 10/2016: 1. Bilateral renal cortical thinning. No acute renal abnormality identified. No hydronephrosis. 4 cm simple cyst left kidney. 2. No evidence of bladder distention.  Rectal pain, exacerbated by sitting on a hard surface. He has not noted urethral discharge and denies risk factors for STDs.  In the past he has had prostatitis. No chills, fever, unusual fatigue. Lab Results  Component Value Date   PSA 1.41 05/05/2010    He is on Aspirin 81 mg daily and Effient 10 mg daily , Hx of CAD.  CKD IV, e GFR 11/18/17 was 10 and and Cr 5.49. He follows with nephrologist, next follow-up appointment 01/08/2018. He would like for me to check his kidney function again today.  He has not noted blood in the stool, nose/gum bleeding. History of anemia of chronic disease.  Lab Results  Component Value Date   CREATININE 5.49 (H) 11/18/2017   BUN 60 (H) 11/18/2017   NA 139 11/18/2017   K 4.8 11/18/2017   CL 105 11/18/2017   CO2 24 11/18/2017      Lab Results  Component Value Date   WBC 8.9 11/18/2017   HGB  9.4 (L) 11/18/2017   HCT 29.2 (L) 11/18/2017   MCV 93.9 11/18/2017   PLT 158 11/18/2017   DM 2 with neuropathy, BS reported as "good", he denies hypoglycemic episodes.  Lab Results  Component Value Date   HGBA1C 6.5 08/10/2017     Review of Systems  Constitutional: Positive for fatigue. Negative for activity change, appetite change, fever and unexpected weight change.  HENT: Negative for nosebleeds, sore throat and trouble swallowing.   Eyes: Negative for redness and visual disturbance.  Respiratory: Negative for cough, shortness of breath and wheezing.   Cardiovascular: Negative for chest pain, palpitations and leg swelling.  Gastrointestinal: Negative for abdominal pain, nausea and vomiting.  Genitourinary: Positive for hematuria. Negative for decreased urine volume, discharge, dysuria, flank pain and scrotal swelling.  Skin: Negative for pallor and rash.  Allergic/Immunologic: Positive for environmental allergies.  Neurological: Negative for syncope, weakness and headaches.  Hematological: Negative for adenopathy. Does not bruise/bleed easily.  Psychiatric/Behavioral: Negative for confusion.      Current Outpatient Medications on File Prior to Visit  Medication Sig Dispense Refill  . albuterol (PROVENTIL HFA;VENTOLIN HFA) 108 (90 Base) MCG/ACT inhaler Inhale 2 puffs into the lungs every 6 (six) hours as needed for wheezing or shortness of breath. 1 Inhaler 0  . allopurinol (ZYLOPRIM) 300 MG tablet TAKE 1/2 (ONE-HALF) TABLET BY MOUTH ONCE DAILY 45 tablet 1  . amitriptyline (ELAVIL) 10 MG tablet Take  10 mg by mouth.    Marland Kitchen aspirin EC 81 MG tablet Take 81 mg by mouth daily.    . calcitRIOL (ROCALTROL) 0.25 MCG capsule Take 0.25 mcg by mouth daily.    . carvedilol (COREG) 6.25 MG tablet TAKE 1 TABLET BY MOUTH TWICE DAILY WITH A MEAL 180 tablet 1  . colchicine 0.6 MG tablet Take 1 tablet (0.6 mg total) by mouth daily as needed (for gout flares).    Marland Kitchen glipiZIDE (GLUCOTROL) 10 MG  tablet TAKE 1 TABLET BY MOUTH ONCE DAILY BEFORE BREAKFAST 90 tablet 1  . glucose blood (ONE TOUCH ULTRA TEST) test strip USE ONE STRIP TO CHECK GLUCOSE 4 TIMES DAILY 400 each 0  . guaiFENesin (MUCINEX) 600 MG 12 hr tablet Take 1 tablet (600 mg total) by mouth every 12 (twelve) hours as needed for to loosen phlegm. 30 tablet 0  . hydrALAZINE (APRESOLINE) 50 MG tablet Take 1 tablet (50 mg total) by mouth 3 (three) times daily. 90 tablet 2  . insulin degludec (TRESIBA FLEXTOUCH) 100 UNIT/ML SOPN FlexTouch Pen Inject 0.5 mLs (50 Units total) into the skin daily at 10 pm. 15 mL 2  . isosorbide mononitrate (IMDUR) 60 MG 24 hr tablet TAKE 1 TABLET BY MOUTH ONCE DAILY 90 tablet 2  . magnesium oxide (MAG-OX) 400 MG tablet Take 400 mg by mouth daily.    . metolazone (ZAROXOLYN) 2.5 MG tablet Take 2.5 mg by mouth daily as needed (fluid).    . montelukast (SINGULAIR) 10 MG tablet Take 10 mg by mouth at bedtime.     . nitroGLYCERIN (NITROSTAT) 0.4 MG SL tablet Place 1 tablet (0.4 mg total) under the tongue every 5 (five) minutes as needed for chest pain. 30 tablet 12  . NOVOLOG FLEXPEN 100 UNIT/ML FlexPen  INJECT 25 UNITS INTO THE SKIN THREE TIMES DAILY USING A SLIDING SCALE 100 mL 3  . Oxycodone HCl 10 MG TABS Take 10 mg by mouth 4 (four) times daily as needed.    . prasugrel (EFFIENT) 10 MG TABS Take 10 mg by mouth daily.      . ranitidine (ZANTAC) 300 MG tablet TAKE 1 TABLET BY MOUTH AT BEDTIME 90 tablet 1  . simvastatin (ZOCOR) 40 MG tablet Take 1 tablet (40 mg total) by mouth at bedtime. 90 tablet 3  . tamsulosin (FLOMAX) 0.4 MG CAPS capsule   0  . torsemide (DEMADEX) 20 MG tablet TAKE ONE TABLET BY MOUTH TWICE DAILY 200 tablet 3  . Vitamin D, Ergocalciferol, (DRISDOL) 50000 units CAPS capsule Take 50,000 Units by mouth every 7 (seven) days.     No current facility-administered medications on file prior to visit.      Past Medical History:  Diagnosis Date  . Acute renal failure superimposed on  chronic kidney disease ([place])    [name] 11/01/2016  . Anemia   . Arthritis    "back, knees" (11/01/2016)  . Asthma   . CAD (coronary artery disease) 12/31/07   [facility] stents- placed in the circumflex and LAD  . Chronic bronchitis ([place])   . History of blood transfusion 10/30/2016   "related to anemia"  . History of gout   . History of kidney stones   . Hyperlipidemia   . Hypertension   . NSTEMI (non-ST elevated myocardial infarction) ([place]) 10/30/2016  . OA (osteoarthritis)   . Obese   . On home oxygen therapy    "2L w/CPAP" (11/01/2016)  . OSA on CPAP   . Psoriatic arthritis ([place])   .  Type II diabetes mellitus ([facility])    No Known Allergies  Social History   Socioeconomic History  . Marital status: Divorced    Spouse name: Not on file  . Number of children: Not on file  . Years of education: Not on file  . Highest education level: Not on file  Occupational History  . Not on file  Social Needs  . Financial resource strain: Not on file  . Food insecurity:    Worry: Not on file    Inability: Not on file  . Transportation needs:    Medical: Not on file    Non-medical: Not on file  Tobacco Use  . Smoking status: Never Smoker  . Smokeless tobacco: Never Used  Substance and Sexual Activity  . Alcohol use: Yes    Comment: 11/01/2016 "nothing in years"  . Drug use: No  . Sexual activity: Yes  Lifestyle  . Physical activity:    Days per week: Not on file    Minutes per session: Not on file  . Stress: Not on file  Relationships  . Social connections:    Talks on phone: Not on file    Gets together: Not on file    Attends religious service: Not on file    Active member of club or organization: Not on file    Attends meetings of clubs or organizations: Not on file    Relationship status: Not on file  Other Topics Concern  . Not on file  Social History Narrative  . Not on file    Vitals:   12/24/17 0850  BP: 140/88  Pulse: 100  Resp: 16  Temp: 98.7 F  (37.1 C)  SpO2: 97%   Body mass index is 46.89 kg/m.   Physical Exam  Nursing note and vitals reviewed. Constitutional: He is oriented to person, place, and time. He appears well-developed. No distress.  HENT:  Head: Normocephalic and atraumatic.  Mouth/Throat: Oropharynx is clear and moist and mucous membranes are normal.  Eyes: Pupils are equal, round, and reactive to light. Conjunctivae are normal.  Cardiovascular: Normal rate and regular rhythm.  No murmur heard. Pulses:      Dorsalis pedis pulses are 2+ on the right side, and 2+ on the left side.  Respiratory: Effort normal and breath sounds normal. No respiratory distress.  GI: Soft. He exhibits no mass. There is no tenderness.  Genitourinary: Rectal exam shows no mass. Prostate is enlarged (Moderate) and tender.  Musculoskeletal: He exhibits edema (1-2+ pitting LE edema,bilateral.). He exhibits no tenderness.  Lymphadenopathy:    He has no cervical adenopathy.  Neurological: He is alert and oriented to person, place, and time. He has normal strength.  Stable, antalgic gait with no assistance.  Skin: Skin is warm. No rash noted. No erythema.  Psychiatric: He has a normal mood and affect.  Well groomed, good eye contact.    ASSESSMENT AND PLAN:  Kent Osborne was seen today for hospitalization follow-up.  Orders Placed This Encounter  Procedures  . Basic metabolic panel  . CBC with Differential/Platelet  . PSA  . Potassium    Lab Results  Component Value Date   CREATININE 5.91 (HH) 12/24/2017   BUN 74 (H) 12/24/2017   NA 139 12/24/2017   K 5.4 (H) 12/24/2017   CL 103 12/24/2017   CO2 24 12/24/2017   Lab Results  Component Value Date   WBC 7.3 12/24/2017   HGB 10.0 (L) 12/24/2017   HCT 29.2 (L) 12/24/2017  MCV 93.2 12/24/2017   PLT 161.0 12/24/2017    Gross hematuria  We discussed possible etiologies: UTI, nephrolithiasis, and malignancy among some. Recent urine culture did not meet criteria for  UTI.  He has been treated with antibiotic twice. Hx of nephrolithiasis, he is familiar with symptoms.  He is not having back or abdominal pain. No history of tobacco use.  Appointment with urologist is pending.  -     Basic metabolic panel -     CBC with Differential/Platelet  Prostatitis, unspecified prostatitis type  Educated about side effects of ciprofloxacin, including Achilles tendon rupture and neuropathy.  Dose was adjusted due to CKD IV. Further recommendations will be given according to lab results.  -     ciprofloxacin (CIPRO) 500 MG tablet; Take 1 tablet (500 mg total) by mouth daily with breakfast.  Chronic kidney disease (CKD), stage IV (severe) ([place]) He has an appointment with his nephrologist in a few days. No changes in current management.  BPH associated with nocturia Symptoms reported as stable. Continue Flomax 0.4 mg daily. Further recommendation will be given according to PSA results.   Hyperkalemia  K+ 5.4. We will try to bring patient tomorrow to repeat potassium.      Teofil Maniaci G. Martinique, MD  [facility]. [place] office.

## 2017-12-24 ENCOUNTER — Encounter: Payer: Self-pay | Admitting: Family Medicine

## 2017-12-24 ENCOUNTER — Ambulatory Visit (INDEPENDENT_AMBULATORY_CARE_PROVIDER_SITE_OTHER): Payer: Medicare Other | Admitting: Family Medicine

## 2017-12-24 VITALS — BP 140/88 | HR 100 | Temp 98.7°F | Resp 16 | Ht 68.0 in | Wt 308.4 lb

## 2017-12-24 DIAGNOSIS — N419 Inflammatory disease of prostate, unspecified: Secondary | ICD-10-CM

## 2017-12-24 DIAGNOSIS — N401 Enlarged prostate with lower urinary tract symptoms: Secondary | ICD-10-CM | POA: Diagnosis not present

## 2017-12-24 DIAGNOSIS — N184 Chronic kidney disease, stage 4 (severe): Secondary | ICD-10-CM

## 2017-12-24 DIAGNOSIS — E875 Hyperkalemia: Secondary | ICD-10-CM

## 2017-12-24 DIAGNOSIS — R351 Nocturia: Secondary | ICD-10-CM

## 2017-12-24 DIAGNOSIS — R310 Gross hematuria: Secondary | ICD-10-CM

## 2017-12-24 LAB — CBC WITH DIFFERENTIAL/PLATELET
Basophils Absolute: 0 10*3/uL (ref 0.0–0.1)
Basophils Relative: 0.3 % (ref 0.0–3.0)
EOS PCT: 1.3 % (ref 0.0–5.0)
Eosinophils Absolute: 0.1 10*3/uL (ref 0.0–0.7)
HCT: 29.2 % — ABNORMAL LOW (ref 39.0–52.0)
Hemoglobin: 10 g/dL — ABNORMAL LOW (ref 13.0–17.0)
LYMPHS ABS: 1.9 10*3/uL (ref 0.7–4.0)
Lymphocytes Relative: 25.3 % (ref 12.0–46.0)
MCHC: 34.4 g/dL (ref 30.0–36.0)
MCV: 93.2 fl (ref 78.0–100.0)
MONOS PCT: 7.4 % (ref 3.0–12.0)
Monocytes Absolute: 0.5 10*3/uL (ref 0.1–1.0)
NEUTROS ABS: 4.8 10*3/uL (ref 1.4–7.7)
NEUTROS PCT: 65.7 % (ref 43.0–77.0)
Platelets: 161 10*3/uL (ref 150.0–400.0)
RBC: 3.13 Mil/uL — AB (ref 4.22–5.81)
RDW: 13.4 % (ref 11.5–15.5)
WBC: 7.3 10*3/uL (ref 4.0–10.5)

## 2017-12-24 LAB — BASIC METABOLIC PANEL
BUN: 74 mg/dL — ABNORMAL HIGH (ref 6–23)
CO2: 24 meq/L (ref 19–32)
Calcium: 8 mg/dL — ABNORMAL LOW (ref 8.4–10.5)
Chloride: 103 mEq/L (ref 96–112)
Creatinine, Ser: 5.91 mg/dL (ref 0.40–1.50)
GFR: 10.26 mL/min — CL (ref >60.00)
GLUCOSE: 142 mg/dL — AB (ref 70–99)
POTASSIUM: 5.4 meq/L — AB (ref 3.5–5.1)
SODIUM: 139 meq/L (ref 135–145)

## 2017-12-24 LAB — PSA: PSA: 0.6 ng/mL (ref 0.10–4.00)

## 2017-12-24 MED ORDER — CIPROFLOXACIN HCL 500 MG PO TABS: 500.0000 mg | ORAL_TABLET | Freq: Every day | ORAL | 0 refills | Status: AC

## 2017-12-24 NOTE — Assessment & Plan Note (Signed)
He has an appointment with his nephrologist in a few days. No changes in current management.

## 2017-12-24 NOTE — Assessment & Plan Note (Signed)
Symptoms reported as stable. Continue Flomax 0.4 mg daily. Further recommendation will be given according to PSA results.

## 2017-12-24 NOTE — Patient Instructions (Addendum)
A few things to remember from today's visit:   Gross hematuria - Plan: Basic metabolic panel, CBC with Differential/Platelet  Prostatitis, unspecified prostatitis type - Plan: ciprofloxacin (CIPRO) 500 MG tablet, PSA  BPH associated with nocturia - Plan: tamsulosin (FLOMAX) 0.4 MG CAPS capsule, PSA     Faxed referral / notes to [facility] Address: [address], [place], [place] [address] Phone: [phone] Fax [phone] Their office will contact pt to schedule directly and inform our office of appointment      Please be sure medication list is accurate. If a new problem present, please set up appointment sooner than planned today.

## 2017-12-25 ENCOUNTER — Other Ambulatory Visit: Payer: Medicare Other

## 2017-12-25 ENCOUNTER — Telehealth: Payer: Self-pay | Admitting: *Deleted

## 2017-12-25 ENCOUNTER — Telehealth: Payer: Self-pay | Admitting: Family Medicine

## 2017-12-25 ENCOUNTER — Ambulatory Visit: Payer: Self-pay | Admitting: *Deleted

## 2017-12-25 NOTE — Telephone Encounter (Signed)
Message sent to Dr. Jordan for review. 

## 2017-12-25 NOTE — Telephone Encounter (Signed)
He needs to go to the ER, he may have a kidney stone. Also hypeK+, so electrolytes abnormality needs to be considered.  Thanks, BJ

## 2017-12-25 NOTE — Telephone Encounter (Signed)
See triage note.

## 2017-12-25 NOTE — Telephone Encounter (Signed)
Lab results faxed to Dr. Marlowe Sax on 12/25/17 at 16:14 with confirmation.

## 2017-12-25 NOTE — Telephone Encounter (Signed)
Pt called for results; during call reports left lower back pain and "dribbling urine". States "doesn't feel like I'M emptying my bladder."  States took his Cipro at 1300 yesterday and symptoms started at 1700. Denies any further hematuria. During call, pt with nausea and vomiting. Spoke with Vita Barley, will alert Dr. Martinique. Triage note in progress. Pt has lab appt at 1030 today. Please advise: [phone]

## 2017-12-25 NOTE — Telephone Encounter (Signed)
Spoke with patient, gave instructions per Dr. Martinique. Patient stated that he felt a little better, was laying down, but he would go to the ER since Dr. Martinique suggested that he go. Told him that I would call to check on him later.

## 2017-12-25 NOTE — Telephone Encounter (Signed)
>>   Dec 25, 2017  3:49 PM Oneta Rack wrote: Patient wanted Dr. Martinique nurse to fax lab results to Dr. Marlowe Sax nephrologist [address], [place] [address] as soon as the office can, specialist awaiting to view. Fax # [phone] and phone # [phone]

## 2017-12-25 NOTE — Telephone Encounter (Signed)
Pt called for results of lab work from yesterday. During call pt reported left sided lower back/flank pain, intermittent, 6-7/10. States took his Cipro yesterday afternoon at 1300 and pain began at 1700. Also reports "dribbling" doesn't feel like I'm emptying my bladder." Denies any further hematuria. Afebrile,denies SOB, dizziness. Pt seen by Dr. Martinique yesterday and has upcoming nephrology appt.  NT spoke with [facility] who will alert Dr. Martinique and advise.  During call, pt had episode of vomiting. States this was only episode. Pt has lab appt for today at 1030. CAre advise given per protocol.  Reason for Disposition . [1] Pain or burning with urination AND [2] flank pain (i.e., in side, below ribs and above hip)  Answer Assessment - Initial Assessment Questions 1. ONSET: "When did the pain begin?"      Yesterday 1700 2. LOCATION: "Where does it hurt?" (upper, mid or lower back)     Left, lower back/ flank area 3. SEVERITY: "How bad is the pain?"  (e.g., Scale 1-10; mild, moderate, or severe)   - MILD (1-3): doesn't interfere with normal activities    - MODERATE (4-7): interferes with normal activities or awakens from sleep    - SEVERE (8-10): excruciating pain, unable to do any normal activities      6-7/10 4. PATTERN: "Is the pain constant?" (e.g., yes, no; constant, intermittent)      Intermittent 5. RADIATION: "Does the pain shoot into your legs or elsewhere?"     no 6. CAUSE:  "What do you think is causing the back pain?"     unsure 7. BACK OVERUSE:  "Any recent lifting of heavy objects, strenuous work or exercise?"     Seen 12/24/17 hematuria 8. MEDICATIONS: "What have you taken so far for the pain?" (e.g., nothing, acetaminophen, NSAIDS)     "Pain pill" 9. NEUROLOGIC SYMPTOMS: "Do you have any weakness, numbness, or problems with bowel/bladder control?"     "Dribbling ,not emptying bladder" 10. OTHER SYMPTOMS: "Do you have any other symptoms?" (e.g., fever, abdominal pain, burning with  urination, blood in urine)     Emesis during call, only episode  Protocols used: BACK PAIN-A-AH

## 2017-12-26 ENCOUNTER — Other Ambulatory Visit (INDEPENDENT_AMBULATORY_CARE_PROVIDER_SITE_OTHER): Payer: Medicare Other

## 2017-12-26 DIAGNOSIS — E875 Hyperkalemia: Secondary | ICD-10-CM | POA: Diagnosis not present

## 2017-12-26 LAB — POTASSIUM: Potassium: 4.8 mEq/L (ref 3.5–5.1)

## 2018-01-01 DIAGNOSIS — N185 Chronic kidney disease, stage 5: Secondary | ICD-10-CM | POA: Diagnosis not present

## 2018-01-02 DIAGNOSIS — R309 Painful micturition, unspecified: Secondary | ICD-10-CM | POA: Diagnosis not present

## 2018-01-02 DIAGNOSIS — N185 Chronic kidney disease, stage 5: Secondary | ICD-10-CM | POA: Diagnosis not present

## 2018-01-07 NOTE — Progress Notes (Signed)
HPI:   [name] is a 67 y.o. male, who is here today for 5-6 months follow up.   He was last seen on 12/24/2017, when he was complaining of gross hematuria and rectal pain.  Diagnosed with acute prostatitis, he started with Cipro 500 mg daily,10 days left.  Lab Results  Component Value Date   WBC 7.3 12/24/2017   HGB 10.0 (L) 12/24/2017   HCT 29.2 (L) 12/24/2017   MCV 93.2 12/24/2017   PLT 161.0 12/24/2017   Lab Results  Component Value Date   PSA 0.60 12/24/2017   PSA 1.41 05/05/2010    Rectal pain has resolved. He has not had gross hematuria in the past week. Still having urinary frequency and occasionally minimal burning sensation with voiding. No chills or fever. He still has not received information about appointment with urologist, referral was placed in 10/2017.  He is positive nobody has called him yet.     Diabetes Mellitus II:   Chronic. Currently on NovoLog 25 units 3 times daily,Glipizide 10 mg daily, and Tresiba 50 units daily.He has noted that BS's are better with [place]  Checking BS's : FG 100-150. A few times he has had < 100. Hypoglycemia: Denies.  He is tolerating medications well. Negative for abdominal pain, nausea, vomiting, polydipsia, polyuria, or polyphagia. Peripheral neuropathy, stable numbness  and burning.   Lab Results  Component Value Date   HGBA1C 6.5 08/10/2017   Lab Results  Component Value Date   MICROALBUR 14.3 (H) 11/02/2015    Hypertension:   Currently on hydralazine 50 mg twice daily, Imdur 60 mg daily, and Coreg 6.25 mg twice daily. Last eye exam 08/2017, he is not sure about retinopathy.  According to patient, ophthalmology is following "something in my eye."  6 months follow-up was recommended.   He is taking medications as instructed, no side effects reported.  He has not noted unusual headache, visual changes, exertional chest pain, dyspnea,  focal weakness, or worsening edema.   Lab Results    Component Value Date   CREATININE 5.91 (HH) 12/24/2017   BUN 74 (H) 12/24/2017   NA 139 12/24/2017   K 4.8 12/26/2017   CL 103 12/24/2017   CO2 24 12/24/2017   Follows with nephrologist, CKD IV.   Review of Systems  Constitutional: Negative for activity change, appetite change, fatigue and fever.  HENT: Negative for nosebleeds, sore throat and trouble swallowing.   Eyes: Negative for redness and visual disturbance.  Respiratory: Negative for cough, shortness of breath and wheezing.   Cardiovascular: Positive for leg swelling. Negative for chest pain and palpitations.  Gastrointestinal: Negative for abdominal pain, nausea and vomiting.  Endocrine: Negative for polydipsia, polyphagia and polyuria.  Genitourinary: Positive for frequency. Negative for decreased urine volume, discharge and hematuria.  Skin: Negative for rash and wound.  Neurological: Positive for numbness. Negative for weakness and headaches.      Current Outpatient Medications on File Prior to Visit  Medication Sig Dispense Refill  . albuterol (PROVENTIL HFA;VENTOLIN HFA) 108 (90 Base) MCG/ACT inhaler Inhale 2 puffs into the lungs every 6 (six) hours as needed for wheezing or shortness of breath. 1 Inhaler 0  . allopurinol (ZYLOPRIM) 300 MG tablet TAKE 1/2 (ONE-HALF) TABLET BY MOUTH ONCE DAILY 45 tablet 1  . amitriptyline (ELAVIL) 10 MG tablet Take 10 mg by mouth.    Marland Kitchen aspirin EC 81 MG tablet Take 81 mg by mouth daily.    . calcitRIOL (ROCALTROL)  0.25 MCG capsule Take 0.25 mcg by mouth daily.    . carvedilol (COREG) 6.25 MG tablet TAKE 1 TABLET BY MOUTH TWICE DAILY WITH A MEAL 180 tablet 1  . ciprofloxacin (CIPRO) 500 MG tablet Take 1 tablet (500 mg total) by mouth daily with breakfast. 30 tablet 0  . colchicine 0.6 MG tablet Take 1 tablet (0.6 mg total) by mouth daily as needed (for gout flares).    Marland Kitchen glipiZIDE (GLUCOTROL) 10 MG tablet TAKE 1 TABLET BY MOUTH ONCE DAILY BEFORE BREAKFAST 90 tablet 1  . glucose blood  (ONE TOUCH ULTRA TEST) test strip USE ONE STRIP TO CHECK GLUCOSE 4 TIMES DAILY 400 each 0  . guaiFENesin (MUCINEX) 600 MG 12 hr tablet Take 1 tablet (600 mg total) by mouth every 12 (twelve) hours as needed for to loosen phlegm. 30 tablet 0  . hydrALAZINE (APRESOLINE) 50 MG tablet Take 1 tablet (50 mg total) by mouth 3 (three) times daily. 90 tablet 2  . insulin degludec (TRESIBA FLEXTOUCH) 100 UNIT/ML SOPN FlexTouch Pen Inject 0.5 mLs (50 Units total) into the skin daily at 10 pm. 15 mL 2  . isosorbide mononitrate (IMDUR) 60 MG 24 hr tablet TAKE 1 TABLET BY MOUTH ONCE DAILY 90 tablet 2  . magnesium oxide (MAG-OX) 400 MG tablet Take 400 mg by mouth daily.    . metolazone (ZAROXOLYN) 2.5 MG tablet Take 2.5 mg by mouth daily as needed (fluid).    . montelukast (SINGULAIR) 10 MG tablet Take 10 mg by mouth at bedtime.     . nitroGLYCERIN (NITROSTAT) 0.4 MG SL tablet Place 1 tablet (0.4 mg total) under the tongue every 5 (five) minutes as needed for chest pain. 30 tablet 12  . NOVOLOG FLEXPEN 100 UNIT/ML FlexPen  INJECT 25 UNITS INTO THE SKIN THREE TIMES DAILY USING A SLIDING SCALE 100 mL 3  . Oxycodone HCl 10 MG TABS Take 10 mg by mouth 4 (four) times daily as needed.    . prasugrel (EFFIENT) 10 MG TABS Take 10 mg by mouth daily.      . ranitidine (ZANTAC) 300 MG tablet TAKE 1 TABLET BY MOUTH AT BEDTIME 90 tablet 1  . simvastatin (ZOCOR) 40 MG tablet Take 1 tablet (40 mg total) by mouth at bedtime. 90 tablet 3  . tamsulosin (FLOMAX) 0.4 MG CAPS capsule   0  . torsemide (DEMADEX) 20 MG tablet TAKE ONE TABLET BY MOUTH TWICE DAILY 200 tablet 3  . Vitamin D, Ergocalciferol, (DRISDOL) 50000 units CAPS capsule Take 50,000 Units by mouth every 7 (seven) days.     No current facility-administered medications on file prior to visit.      Past Medical History:  Diagnosis Date  . Acute renal failure superimposed on chronic kidney disease ([place])    [name] 11/01/2016  . Anemia   . Arthritis    "back, knees"  (11/01/2016)  . Asthma   . CAD (coronary artery disease) 12/31/07   [facility] stents- placed in the circumflex and LAD  . Chronic bronchitis ([place])   . History of blood transfusion 10/30/2016   "related to anemia"  . History of gout   . History of kidney stones   . Hyperlipidemia   . Hypertension   . NSTEMI (non-ST elevated myocardial infarction) ([place]) 10/30/2016  . OA (osteoarthritis)   . Obese   . On home oxygen therapy    "2L w/CPAP" (11/01/2016)  . OSA on CPAP   . Psoriatic arthritis ([place])   . Type  II diabetes mellitus ([place])    No Known Allergies  Social History   Socioeconomic History  . Marital status: Divorced    Spouse name: Not on file  . Number of children: Not on file  . Years of education: Not on file  . Highest education level: Not on file  Occupational History  . Not on file  Social Needs  . Financial resource strain: Not on file  . Food insecurity:    Worry: Not on file    Inability: Not on file  . Transportation needs:    Medical: Not on file    Non-medical: Not on file  Tobacco Use  . Smoking status: Never Smoker  . Smokeless tobacco: Never Used  Substance and Sexual Activity  . Alcohol use: Yes    Comment: 11/01/2016 "nothing in years"  . Drug use: No  . Sexual activity: Yes  Lifestyle  . Physical activity:    Days per week: Not on file    Minutes per session: Not on file  . Stress: Not on file  Relationships  . Social connections:    Talks on phone: Not on file    Gets together: Not on file    Attends religious service: Not on file    Active member of club or organization: Not on file    Attends meetings of clubs or organizations: Not on file    Relationship status: Not on file  Other Topics Concern  . Not on file  Social History Narrative  . Not on file    Vitals:   01/08/18 0805  BP: 140/82  Pulse: 89  Resp: 16  Temp: 97.8 F (36.6 C)  SpO2: 97%   Body mass index is 46.38 kg/m.   Physical Exam  Nursing note and  vitals reviewed. Constitutional: He is oriented to person, place, and time. He appears well-developed. No distress.  HENT:  Head: Normocephalic and atraumatic.  Mouth/Throat: Oropharynx is clear and moist and mucous membranes are normal.  Eyes: Pupils are equal, round, and reactive to light. Right eye exhibits no discharge. Left eye exhibits no discharge.    Left mild subconjunctival hemorrhage   Cardiovascular: Normal rate and regular rhythm.  No murmur heard. Pulses:      Dorsalis pedis pulses are 2+ on the right side, and 2+ on the left side.  DP pulse present,bilateral.  Respiratory: Effort normal and breath sounds normal. No respiratory distress.  GI: Soft. He exhibits no mass. There is no hepatomegaly. There is no tenderness.  Musculoskeletal: He exhibits edema (2+ pitting LE edema,bilateral). He exhibits no tenderness.  Lymphadenopathy:    He has no cervical adenopathy.  Neurological: He is alert and oriented to person, place, and time. He has normal strength. Gait normal.  Stable gait without assistance.  Skin: Skin is warm. No rash noted. No erythema.  Psychiatric: He has a normal mood and affect.  Well groomed, good eye contact.      ASSESSMENT AND PLAN:   [name] was seen today for 5-6 months follow-up.  Orders Placed This Encounter  Procedures  . Basic metabolic panel  . Urinalysis, Routine w reflex microscopic  . Hemoglobin A1c  . Fructosamine   Lab Results  Component Value Date   CREATININE 6.42 (HH) 01/08/2018   BUN 75 (H) 01/08/2018   NA 141 01/08/2018   K 4.7 01/08/2018   CL 105 01/08/2018   CO2 24 01/08/2018   Lab Results  Component Value Date  HGBA1C 6.1 01/08/2018   Acute prostatitis  Symptoms hava improved. Complete abx treatment.   Gross hematuria  Resolved. We called urologist's office and apparently he had appt 12/26/17 that was cancelled. Appt for tomorrow arranged.   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 No changes in current management, initially will be adjusted according to lab results. Regular exercise and healthy diet with avoidance of added sugar food intake is an important part of treatment and recommended. Annual eye exam and foot care recommended. F/U in 5-6 months   Essential hypertension SBP slightly elevated. Reporting adequate home BPs. No change in current management. Continue low-salt diet. Follow-up in 5 to 6 months.  Chronic kidney disease (CKD), stage IV (severe) (HCC) Renal function declining, last e GFR 10 (10,16). Next appointment with nephrologist on 02/03/2018. He would like for me to check his renal function again today.     -[name] was advised to return sooner than planned today if new concerns arise.       Betty G. Martinique, MD  [facility]. [place] office.

## 2018-01-08 ENCOUNTER — Telehealth: Payer: Self-pay | Admitting: Family Medicine

## 2018-01-08 ENCOUNTER — Encounter: Payer: Self-pay | Admitting: Family Medicine

## 2018-01-08 ENCOUNTER — Ambulatory Visit (INDEPENDENT_AMBULATORY_CARE_PROVIDER_SITE_OTHER): Payer: Medicare Other | Admitting: Family Medicine

## 2018-01-08 VITALS — BP 140/82 | HR 89 | Temp 97.8°F | Resp 16 | Ht 68.0 in | Wt 305.0 lb

## 2018-01-08 DIAGNOSIS — I10 Essential (primary) hypertension: Secondary | ICD-10-CM

## 2018-01-08 DIAGNOSIS — N410 Acute prostatitis: Secondary | ICD-10-CM | POA: Diagnosis not present

## 2018-01-08 DIAGNOSIS — E1165 Type 2 diabetes mellitus with hyperglycemia: Secondary | ICD-10-CM

## 2018-01-08 DIAGNOSIS — IMO0002 Reserved for concepts with insufficient information to code with codable children: Secondary | ICD-10-CM

## 2018-01-08 DIAGNOSIS — N184 Chronic kidney disease, stage 4 (severe): Secondary | ICD-10-CM

## 2018-01-08 DIAGNOSIS — E1151 Type 2 diabetes mellitus with diabetic peripheral angiopathy without gangrene: Secondary | ICD-10-CM

## 2018-01-08 DIAGNOSIS — R310 Gross hematuria: Secondary | ICD-10-CM

## 2018-01-08 LAB — URINALYSIS, ROUTINE W REFLEX MICROSCOPIC
Bilirubin Urine: NEGATIVE
KETONES UR: NEGATIVE
LEUKOCYTES UA: NEGATIVE
NITRITE: NEGATIVE
Specific Gravity, Urine: 1.015 (ref 1.000–1.030)
TOTAL PROTEIN, URINE-UPE24: 100 — AB
Urine Glucose: NEGATIVE
Urobilinogen, UA: 0.2 (ref 0.0–1.0)
pH: 6.5 (ref 5.0–8.0)

## 2018-01-08 LAB — BASIC METABOLIC PANEL
BUN: 75 mg/dL — AB (ref 6–23)
CO2: 24 mEq/L (ref 19–32)
Calcium: 8.3 mg/dL — ABNORMAL LOW (ref 8.4–10.5)
Chloride: 105 mEq/L (ref 96–112)
Creatinine, Ser: 6.42 mg/dL (ref 0.40–1.50)
GFR: 9.32 mL/min — CL (ref >60.00)
GLUCOSE: 88 mg/dL (ref 70–99)
POTASSIUM: 4.7 meq/L (ref 3.5–5.1)
SODIUM: 141 meq/L (ref 135–145)

## 2018-01-08 LAB — HEMOGLOBIN A1C: Hgb A1c MFr Bld: 6.1 % (ref 4.6–6.5)

## 2018-01-08 NOTE — Patient Instructions (Addendum)
A few things to remember from today's visit:   Type II diabetes mellitus with circulatory disorder, uncontrolled - Plan: Basic metabolic panel, Urinalysis, Routine w reflex microscopic, Hemoglobin A1c, Fructosamine  Essential hypertension  Chronic kidney disease (CKD), stage IV (severe) (HCC) Complete antibiotic treatment.   Urology appt  Tomorrow at 10:30 Am  Please be sure medication list is accurate. If a new problem present, please set up appointment sooner than planned today.

## 2018-01-08 NOTE — Assessment & Plan Note (Signed)
SBP slightly elevated. Reporting adequate home BPs. No change in current management. Continue low-salt diet. Follow-up in 5 to 6 months.

## 2018-01-08 NOTE — Assessment & Plan Note (Addendum)
Renal function declining, last e GFR 10 (10,16). Next appointment with nephrologist on 02/03/2018. He would like for me to check his renal function again today.

## 2018-01-08 NOTE — Telephone Encounter (Signed)
Call report- critical labs creatinine 6.42 & GFR 9.32, will give Dr. Martinique these results for review.

## 2018-01-08 NOTE — Telephone Encounter (Signed)
Message sent to Dr. Jordan for review. 

## 2018-01-08 NOTE — Assessment & Plan Note (Signed)
HgA1C has been at goal, ordered today. No changes in current management, initially will be adjusted according to lab results. Regular exercise and healthy diet with avoidance of added sugar food intake is an important part of treatment and recommended. Annual eye exam and foot care recommended. F/U in 5-6 months

## 2018-01-09 DIAGNOSIS — N401 Enlarged prostate with lower urinary tract symptoms: Secondary | ICD-10-CM | POA: Diagnosis not present

## 2018-01-09 DIAGNOSIS — N184 Chronic kidney disease, stage 4 (severe): Secondary | ICD-10-CM | POA: Diagnosis not present

## 2018-01-09 DIAGNOSIS — R310 Gross hematuria: Secondary | ICD-10-CM | POA: Diagnosis not present

## 2018-01-10 LAB — FRUCTOSAMINE: Fructosamine: 244 umol/L (ref 190–270)

## 2018-01-15 DIAGNOSIS — R310 Gross hematuria: Secondary | ICD-10-CM | POA: Diagnosis not present

## 2018-01-15 NOTE — Telephone Encounter (Signed)
See lab result note,01/08/18. Mishaal Lansdale Martinique, MD

## 2018-01-17 DIAGNOSIS — N185 Chronic kidney disease, stage 5: Secondary | ICD-10-CM | POA: Diagnosis not present

## 2018-01-18 NOTE — Telephone Encounter (Signed)
Can you please fax results of recent BMP to his nephrologist office. He has an appointment with nephrologist on 02/03/2018, I wonder if he can be seen sooner. Thanks, BJ

## 2018-01-21 NOTE — Telephone Encounter (Signed)
Results faxed to [facility] as requested.

## 2018-01-28 ENCOUNTER — Other Ambulatory Visit: Payer: Self-pay | Admitting: Family Medicine

## 2018-01-28 DIAGNOSIS — G4730 Sleep apnea, unspecified: Secondary | ICD-10-CM | POA: Diagnosis not present

## 2018-01-28 DIAGNOSIS — E211 Secondary hyperparathyroidism, not elsewhere classified: Secondary | ICD-10-CM | POA: Diagnosis not present

## 2018-01-28 DIAGNOSIS — E1140 Type 2 diabetes mellitus with diabetic neuropathy, unspecified: Secondary | ICD-10-CM | POA: Diagnosis not present

## 2018-01-28 DIAGNOSIS — N185 Chronic kidney disease, stage 5: Secondary | ICD-10-CM | POA: Diagnosis not present

## 2018-01-28 DIAGNOSIS — E559 Vitamin D deficiency, unspecified: Secondary | ICD-10-CM | POA: Diagnosis not present

## 2018-01-28 DIAGNOSIS — R609 Edema, unspecified: Secondary | ICD-10-CM | POA: Diagnosis not present

## 2018-01-28 DIAGNOSIS — I2510 Atherosclerotic heart disease of native coronary artery without angina pectoris: Secondary | ICD-10-CM | POA: Diagnosis not present

## 2018-01-28 DIAGNOSIS — R1012 Left upper quadrant pain: Secondary | ICD-10-CM

## 2018-02-06 DIAGNOSIS — D649 Anemia, unspecified: Secondary | ICD-10-CM | POA: Diagnosis not present

## 2018-02-06 DIAGNOSIS — N179 Acute kidney failure, unspecified: Secondary | ICD-10-CM | POA: Diagnosis not present

## 2018-02-06 DIAGNOSIS — I2510 Atherosclerotic heart disease of native coronary artery without angina pectoris: Secondary | ICD-10-CM | POA: Diagnosis not present

## 2018-02-06 DIAGNOSIS — N185 Chronic kidney disease, stage 5: Secondary | ICD-10-CM | POA: Diagnosis not present

## 2018-02-11 DIAGNOSIS — I2510 Atherosclerotic heart disease of native coronary artery without angina pectoris: Secondary | ICD-10-CM | POA: Diagnosis not present

## 2018-02-11 DIAGNOSIS — K219 Gastro-esophageal reflux disease without esophagitis: Secondary | ICD-10-CM | POA: Diagnosis not present

## 2018-02-11 DIAGNOSIS — E559 Vitamin D deficiency, unspecified: Secondary | ICD-10-CM | POA: Diagnosis not present

## 2018-02-11 DIAGNOSIS — Z7982 Long term (current) use of aspirin: Secondary | ICD-10-CM | POA: Diagnosis not present

## 2018-02-11 DIAGNOSIS — E1122 Type 2 diabetes mellitus with diabetic chronic kidney disease: Secondary | ICD-10-CM | POA: Diagnosis not present

## 2018-02-11 DIAGNOSIS — I129 Hypertensive chronic kidney disease with stage 1 through stage 4 chronic kidney disease, or unspecified chronic kidney disease: Secondary | ICD-10-CM | POA: Diagnosis not present

## 2018-02-11 DIAGNOSIS — Z794 Long term (current) use of insulin: Secondary | ICD-10-CM | POA: Diagnosis not present

## 2018-02-11 DIAGNOSIS — Z87891 Personal history of nicotine dependence: Secondary | ICD-10-CM | POA: Diagnosis not present

## 2018-02-11 DIAGNOSIS — D649 Anemia, unspecified: Secondary | ICD-10-CM | POA: Diagnosis not present

## 2018-02-11 DIAGNOSIS — Z955 Presence of coronary angioplasty implant and graft: Secondary | ICD-10-CM | POA: Diagnosis not present

## 2018-02-11 DIAGNOSIS — G4733 Obstructive sleep apnea (adult) (pediatric): Secondary | ICD-10-CM | POA: Diagnosis not present

## 2018-02-11 DIAGNOSIS — E211 Secondary hyperparathyroidism, not elsewhere classified: Secondary | ICD-10-CM | POA: Diagnosis not present

## 2018-02-11 DIAGNOSIS — Z888 Allergy status to other drugs, medicaments and biological substances status: Secondary | ICD-10-CM | POA: Diagnosis not present

## 2018-02-11 DIAGNOSIS — E8881 Metabolic syndrome: Secondary | ICD-10-CM | POA: Diagnosis not present

## 2018-02-11 DIAGNOSIS — E1140 Type 2 diabetes mellitus with diabetic neuropathy, unspecified: Secondary | ICD-10-CM | POA: Diagnosis not present

## 2018-02-11 DIAGNOSIS — Z6841 Body Mass Index (BMI) 40.0 and over, adult: Secondary | ICD-10-CM | POA: Diagnosis not present

## 2018-02-11 DIAGNOSIS — N184 Chronic kidney disease, stage 4 (severe): Secondary | ICD-10-CM | POA: Diagnosis not present

## 2018-02-11 DIAGNOSIS — E7800 Pure hypercholesterolemia, unspecified: Secondary | ICD-10-CM | POA: Diagnosis not present

## 2018-02-11 DIAGNOSIS — Z7902 Long term (current) use of antithrombotics/antiplatelets: Secondary | ICD-10-CM | POA: Diagnosis not present

## 2018-02-12 DIAGNOSIS — R310 Gross hematuria: Secondary | ICD-10-CM | POA: Diagnosis not present

## 2018-02-13 DIAGNOSIS — I129 Hypertensive chronic kidney disease with stage 1 through stage 4 chronic kidney disease, or unspecified chronic kidney disease: Secondary | ICD-10-CM | POA: Diagnosis not present

## 2018-02-13 DIAGNOSIS — Z794 Long term (current) use of insulin: Secondary | ICD-10-CM | POA: Diagnosis not present

## 2018-02-13 DIAGNOSIS — E1122 Type 2 diabetes mellitus with diabetic chronic kidney disease: Secondary | ICD-10-CM | POA: Diagnosis not present

## 2018-02-13 DIAGNOSIS — G4733 Obstructive sleep apnea (adult) (pediatric): Secondary | ICD-10-CM | POA: Diagnosis not present

## 2018-02-13 DIAGNOSIS — I2510 Atherosclerotic heart disease of native coronary artery without angina pectoris: Secondary | ICD-10-CM | POA: Diagnosis not present

## 2018-02-13 DIAGNOSIS — G8918 Other acute postprocedural pain: Secondary | ICD-10-CM | POA: Diagnosis not present

## 2018-02-13 DIAGNOSIS — N184 Chronic kidney disease, stage 4 (severe): Secondary | ICD-10-CM | POA: Diagnosis not present

## 2018-02-15 DIAGNOSIS — Z4801 Encounter for change or removal of surgical wound dressing: Secondary | ICD-10-CM | POA: Diagnosis not present

## 2018-02-18 ENCOUNTER — Other Ambulatory Visit: Payer: Self-pay | Admitting: Family Medicine

## 2018-02-18 DIAGNOSIS — Z4801 Encounter for change or removal of surgical wound dressing: Secondary | ICD-10-CM | POA: Diagnosis not present

## 2018-02-25 DIAGNOSIS — G4700 Insomnia, unspecified: Secondary | ICD-10-CM | POA: Diagnosis not present

## 2018-02-25 DIAGNOSIS — M47896 Other spondylosis, lumbar region: Secondary | ICD-10-CM | POA: Diagnosis not present

## 2018-02-25 DIAGNOSIS — G8929 Other chronic pain: Secondary | ICD-10-CM | POA: Diagnosis not present

## 2018-02-25 DIAGNOSIS — Z79891 Long term (current) use of opiate analgesic: Secondary | ICD-10-CM | POA: Diagnosis not present

## 2018-02-25 DIAGNOSIS — Z79899 Other long term (current) drug therapy: Secondary | ICD-10-CM | POA: Diagnosis not present

## 2018-02-28 ENCOUNTER — Emergency Department (HOSPITAL_COMMUNITY)
Admission: EM | Admit: 2018-02-28 | Discharge: 2018-03-01 | Disposition: A | Discharge status: Home / Self Care | Payer: Medicare Other | Source: Home / Self Care | Attending: Emergency Medicine | Admitting: Emergency Medicine

## 2018-02-28 ENCOUNTER — Other Ambulatory Visit: Payer: Self-pay

## 2018-02-28 ENCOUNTER — Encounter (HOSPITAL_COMMUNITY): Payer: Self-pay | Admitting: Emergency Medicine

## 2018-02-28 DIAGNOSIS — D49512 Neoplasm of unspecified behavior of left kidney: Secondary | ICD-10-CM | POA: Diagnosis not present

## 2018-02-28 DIAGNOSIS — I129 Hypertensive chronic kidney disease with stage 1 through stage 4 chronic kidney disease, or unspecified chronic kidney disease: Secondary | ICD-10-CM | POA: Insufficient documentation

## 2018-02-28 DIAGNOSIS — I120 Hypertensive chronic kidney disease with stage 5 chronic kidney disease or end stage renal disease: Secondary | ICD-10-CM | POA: Diagnosis not present

## 2018-02-28 DIAGNOSIS — Z794 Long term (current) use of insulin: Secondary | ICD-10-CM | POA: Insufficient documentation

## 2018-02-28 DIAGNOSIS — R339 Retention of urine, unspecified: Secondary | ICD-10-CM | POA: Insufficient documentation

## 2018-02-28 DIAGNOSIS — Z4801 Encounter for change or removal of surgical wound dressing: Secondary | ICD-10-CM | POA: Diagnosis not present

## 2018-02-28 DIAGNOSIS — Z7982 Long term (current) use of aspirin: Secondary | ICD-10-CM

## 2018-02-28 DIAGNOSIS — I252 Old myocardial infarction: Secondary | ICD-10-CM | POA: Insufficient documentation

## 2018-02-28 DIAGNOSIS — Z6841 Body Mass Index (BMI) 40.0 and over, adult: Secondary | ICD-10-CM | POA: Diagnosis not present

## 2018-02-28 DIAGNOSIS — E1122 Type 2 diabetes mellitus with diabetic chronic kidney disease: Secondary | ICD-10-CM

## 2018-02-28 DIAGNOSIS — I10 Essential (primary) hypertension: Secondary | ICD-10-CM | POA: Diagnosis not present

## 2018-02-28 DIAGNOSIS — J45909 Unspecified asthma, uncomplicated: Secondary | ICD-10-CM

## 2018-02-28 DIAGNOSIS — E1140 Type 2 diabetes mellitus with diabetic neuropathy, unspecified: Secondary | ICD-10-CM | POA: Diagnosis not present

## 2018-02-28 DIAGNOSIS — I2510 Atherosclerotic heart disease of native coronary artery without angina pectoris: Secondary | ICD-10-CM | POA: Insufficient documentation

## 2018-02-28 DIAGNOSIS — N184 Chronic kidney disease, stage 4 (severe): Secondary | ICD-10-CM | POA: Insufficient documentation

## 2018-02-28 DIAGNOSIS — Z955 Presence of coronary angioplasty implant and graft: Secondary | ICD-10-CM | POA: Insufficient documentation

## 2018-02-28 DIAGNOSIS — N186 End stage renal disease: Secondary | ICD-10-CM | POA: Diagnosis not present

## 2018-02-28 DIAGNOSIS — Z79899 Other long term (current) drug therapy: Secondary | ICD-10-CM | POA: Insufficient documentation

## 2018-02-28 DIAGNOSIS — R310 Gross hematuria: Secondary | ICD-10-CM

## 2018-02-28 DIAGNOSIS — Z992 Dependence on renal dialysis: Secondary | ICD-10-CM | POA: Diagnosis not present

## 2018-02-28 DIAGNOSIS — N179 Acute kidney failure, unspecified: Secondary | ICD-10-CM | POA: Diagnosis not present

## 2018-02-28 DIAGNOSIS — R1030 Lower abdominal pain, unspecified: Secondary | ICD-10-CM | POA: Diagnosis not present

## 2018-02-28 DIAGNOSIS — R319 Hematuria, unspecified: Secondary | ICD-10-CM | POA: Diagnosis not present

## 2018-02-28 MED ORDER — OXYCODONE-ACETAMINOPHEN 5-325 MG PO TABS
1.0000 | ORAL_TABLET | ORAL | Status: DC | PRN
  Administered 2018-02-28: 1 via ORAL
  Filled 2018-02-28 (×2): qty 1

## 2018-02-28 NOTE — ED Triage Notes (Signed)
Pt reports being seen at [facility], they placed catheter today due to urinary retention. Pt reports no urinary output since. Pt report pain 10/10 to bladder. Pt reports GFR is 7 and he is to start dialysis soon.

## 2018-03-01 ENCOUNTER — Inpatient Hospital Stay (HOSPITAL_COMMUNITY)
Admission: EM | Admit: 2018-03-01 | Discharge: 2018-03-04 | DRG: 696 | Disposition: A | Discharge status: Home / Self Care | Payer: Medicare Other | Attending: Internal Medicine | Admitting: Internal Medicine

## 2018-03-01 ENCOUNTER — Encounter (HOSPITAL_COMMUNITY): Payer: Self-pay | Admitting: *Deleted

## 2018-03-01 ENCOUNTER — Other Ambulatory Visit: Payer: Self-pay | Admitting: Family Medicine

## 2018-03-01 ENCOUNTER — Other Ambulatory Visit: Payer: Self-pay

## 2018-03-01 DIAGNOSIS — D649 Anemia, unspecified: Secondary | ICD-10-CM | POA: Diagnosis present

## 2018-03-01 DIAGNOSIS — D49512 Neoplasm of unspecified behavior of left kidney: Secondary | ICD-10-CM | POA: Diagnosis present

## 2018-03-01 DIAGNOSIS — R339 Retention of urine, unspecified: Secondary | ICD-10-CM | POA: Diagnosis not present

## 2018-03-01 DIAGNOSIS — N184 Chronic kidney disease, stage 4 (severe): Secondary | ICD-10-CM

## 2018-03-01 DIAGNOSIS — K219 Gastro-esophageal reflux disease without esophagitis: Secondary | ICD-10-CM | POA: Diagnosis present

## 2018-03-01 DIAGNOSIS — I4430 Unspecified atrioventricular block: Secondary | ICD-10-CM | POA: Diagnosis present

## 2018-03-01 DIAGNOSIS — N179 Acute kidney failure, unspecified: Secondary | ICD-10-CM | POA: Diagnosis present

## 2018-03-01 DIAGNOSIS — I252 Old myocardial infarction: Secondary | ICD-10-CM | POA: Diagnosis not present

## 2018-03-01 DIAGNOSIS — R310 Gross hematuria: Secondary | ICD-10-CM | POA: Diagnosis not present

## 2018-03-01 DIAGNOSIS — R319 Hematuria, unspecified: Secondary | ICD-10-CM

## 2018-03-01 DIAGNOSIS — E1122 Type 2 diabetes mellitus with diabetic chronic kidney disease: Secondary | ICD-10-CM

## 2018-03-01 DIAGNOSIS — N250 Renal osteodystrophy: Secondary | ICD-10-CM | POA: Diagnosis present

## 2018-03-01 DIAGNOSIS — Z6841 Body Mass Index (BMI) 40.0 and over, adult: Secondary | ICD-10-CM | POA: Diagnosis not present

## 2018-03-01 DIAGNOSIS — E669 Obesity, unspecified: Secondary | ICD-10-CM | POA: Diagnosis present

## 2018-03-01 DIAGNOSIS — Z955 Presence of coronary angioplasty implant and graft: Secondary | ICD-10-CM

## 2018-03-01 DIAGNOSIS — D509 Iron deficiency anemia, unspecified: Secondary | ICD-10-CM | POA: Diagnosis present

## 2018-03-01 DIAGNOSIS — I10 Essential (primary) hypertension: Secondary | ICD-10-CM | POA: Diagnosis not present

## 2018-03-01 DIAGNOSIS — Z7982 Long term (current) use of aspirin: Secondary | ICD-10-CM | POA: Diagnosis not present

## 2018-03-01 DIAGNOSIS — R600 Localized edema: Secondary | ICD-10-CM | POA: Diagnosis present

## 2018-03-01 DIAGNOSIS — N185 Chronic kidney disease, stage 5: Secondary | ICD-10-CM | POA: Diagnosis not present

## 2018-03-01 DIAGNOSIS — D631 Anemia in chronic kidney disease: Secondary | ICD-10-CM | POA: Diagnosis not present

## 2018-03-01 DIAGNOSIS — Z794 Long term (current) use of insulin: Secondary | ICD-10-CM

## 2018-03-01 DIAGNOSIS — J449 Chronic obstructive pulmonary disease, unspecified: Secondary | ICD-10-CM | POA: Diagnosis present

## 2018-03-01 DIAGNOSIS — G4733 Obstructive sleep apnea (adult) (pediatric): Secondary | ICD-10-CM

## 2018-03-01 DIAGNOSIS — N3289 Other specified disorders of bladder: Secondary | ICD-10-CM | POA: Diagnosis not present

## 2018-03-01 DIAGNOSIS — E785 Hyperlipidemia, unspecified: Secondary | ICD-10-CM

## 2018-03-01 DIAGNOSIS — I120 Hypertensive chronic kidney disease with stage 5 chronic kidney disease or end stage renal disease: Secondary | ICD-10-CM | POA: Diagnosis present

## 2018-03-01 DIAGNOSIS — D638 Anemia in other chronic diseases classified elsewhere: Secondary | ICD-10-CM | POA: Diagnosis present

## 2018-03-01 DIAGNOSIS — I119 Hypertensive heart disease without heart failure: Secondary | ICD-10-CM | POA: Diagnosis present

## 2018-03-01 DIAGNOSIS — N186 End stage renal disease: Secondary | ICD-10-CM | POA: Diagnosis present

## 2018-03-01 DIAGNOSIS — Z8249 Family history of ischemic heart disease and other diseases of the circulatory system: Secondary | ICD-10-CM

## 2018-03-01 DIAGNOSIS — N2889 Other specified disorders of kidney and ureter: Secondary | ICD-10-CM | POA: Diagnosis not present

## 2018-03-01 DIAGNOSIS — I2510 Atherosclerotic heart disease of native coronary artery without angina pectoris: Secondary | ICD-10-CM | POA: Diagnosis present

## 2018-03-01 DIAGNOSIS — Z79899 Other long term (current) drug therapy: Secondary | ICD-10-CM

## 2018-03-01 DIAGNOSIS — R1030 Lower abdominal pain, unspecified: Secondary | ICD-10-CM | POA: Diagnosis not present

## 2018-03-01 DIAGNOSIS — R609 Edema, unspecified: Secondary | ICD-10-CM | POA: Diagnosis not present

## 2018-03-01 DIAGNOSIS — F329 Major depressive disorder, single episode, unspecified: Secondary | ICD-10-CM | POA: Diagnosis present

## 2018-03-01 DIAGNOSIS — F32A Depression, unspecified: Secondary | ICD-10-CM | POA: Diagnosis present

## 2018-03-01 DIAGNOSIS — E1129 Type 2 diabetes mellitus with other diabetic kidney complication: Secondary | ICD-10-CM | POA: Diagnosis present

## 2018-03-01 DIAGNOSIS — Z9981 Dependence on supplemental oxygen: Secondary | ICD-10-CM

## 2018-03-01 DIAGNOSIS — M1990 Unspecified osteoarthritis, unspecified site: Secondary | ICD-10-CM | POA: Diagnosis present

## 2018-03-01 LAB — URINALYSIS, ROUTINE W REFLEX MICROSCOPIC: RBC / HPF: >50 RBC/hpf — ABNORMAL HIGH (ref 0–5)

## 2018-03-01 LAB — CBC WITH DIFFERENTIAL/PLATELET
ABS IMMATURE GRANULOCYTES: 0 10*3/uL (ref 0.0–0.1)
Basophils Absolute: 0 10*3/uL (ref 0.0–0.1)
Basophils Relative: 0 %
Eosinophils Absolute: 0 10*3/uL (ref 0.0–0.7)
Eosinophils Relative: 0 %
HCT: 27.5 % — ABNORMAL LOW (ref 39.0–52.0)
Hemoglobin: 8.8 g/dL — ABNORMAL LOW (ref 13.0–17.0)
Immature Granulocytes: 0 %
LYMPHS ABS: 1.7 10*3/uL (ref 0.7–4.0)
Lymphocytes Relative: 17 %
MCH: 29.8 pg (ref 26.0–34.0)
MCHC: 32 g/dL (ref 30.0–36.0)
MCV: 93.2 fL (ref 78.0–100.0)
MONOS PCT: 6 %
Monocytes Absolute: 0.6 10*3/uL (ref 0.1–1.0)
NEUTROS ABS: 7.4 10*3/uL (ref 1.7–7.7)
NEUTROS PCT: 77 %
PLATELETS: 166 10*3/uL (ref 150–400)
RBC: 2.95 MIL/uL — ABNORMAL LOW (ref 4.22–5.81)
RDW: 12.6 % (ref 11.5–15.5)
WBC: 9.7 10*3/uL (ref 4.0–10.5)

## 2018-03-01 LAB — BASIC METABOLIC PANEL
ANION GAP: 16 — AB (ref 5–15)
ANION GAP: 14 (ref 5–15)
BUN: 115 mg/dL — ABNORMAL HIGH (ref 8–23)
BUN: 124 mg/dL — AB (ref 8–23)
CO2: 23 mmol/L (ref 22–32)
CO2: 22 mmol/L (ref 22–32)
CREATININE: 8.12 mg/dL — AB (ref 0.61–1.24)
Calcium: 8.9 mg/dL (ref 8.9–10.3)
Calcium: 8.5 mg/dL — ABNORMAL LOW (ref 8.9–10.3)
Chloride: 99 mmol/L (ref 98–111)
Chloride: 100 mmol/L (ref 98–111)
Creatinine, Ser: 7.13 mg/dL — ABNORMAL HIGH (ref 0.61–1.24)
GFR calc Af Amer: 8 mL/min — ABNORMAL LOW (ref >60)
GFR calc non Af Amer: 6 mL/min — ABNORMAL LOW (ref >60)
GFR, EST AFRICAN AMERICAN: 7 mL/min — AB (ref >60)
GFR, EST NON AFRICAN AMERICAN: 7 mL/min — AB (ref >60)
GLUCOSE: 122 mg/dL — AB (ref 70–99)
GLUCOSE: 137 mg/dL — AB (ref 70–99)
Potassium: 4.9 mmol/L (ref 3.5–5.1)
Potassium: 4.9 mmol/L (ref 3.5–5.1)
SODIUM: 138 mmol/L (ref 135–145)
Sodium: 136 mmol/L (ref 135–145)

## 2018-03-01 LAB — CBC
HCT: 30.5 % — ABNORMAL LOW (ref 39.0–52.0)
HEMOGLOBIN: 9.6 g/dL — AB (ref 13.0–17.0)
MCH: 29.5 pg (ref 26.0–34.0)
MCHC: 31.5 g/dL (ref 30.0–36.0)
MCV: 93.8 fL (ref 78.0–100.0)
Platelets: 202 10*3/uL (ref 150–400)
RBC: 3.25 MIL/uL — ABNORMAL LOW (ref 4.22–5.81)
RDW: 12.6 % (ref 11.5–15.5)
WBC: 14.5 10*3/uL — AB (ref 4.0–10.5)

## 2018-03-01 MED ORDER — INSULIN ASPART 100 UNIT/ML ~~LOC~~ SOLN
0.0000 [IU] | Freq: Every day | SUBCUTANEOUS | Status: DC
  Administered 2018-03-02 – 2018-03-03 (×2): 2 [IU] via SUBCUTANEOUS

## 2018-03-01 MED ORDER — ISOSORBIDE MONONITRATE ER 60 MG PO TB24
60.0000 mg | ORAL_TABLET | Freq: Every day | ORAL | Status: DC
  Administered 2018-03-02 – 2018-03-04 (×3): 60 mg via ORAL
  Filled 2018-03-01 (×3): qty 1

## 2018-03-01 MED ORDER — NITROGLYCERIN 0.4 MG SL SUBL: 0.4000 mg | SUBLINGUAL_TABLET | SUBLINGUAL | Status: DC | PRN

## 2018-03-01 MED ORDER — ACETAMINOPHEN 325 MG PO TABS: 650.0000 mg | ORAL_TABLET | Freq: Four times a day (QID) | ORAL | Status: DC | PRN

## 2018-03-01 MED ORDER — METOLAZONE 2.5 MG PO TABS
2.5000 mg | ORAL_TABLET | Freq: Every day | ORAL | Status: DC | PRN
  Filled 2018-03-01: qty 1

## 2018-03-01 MED ORDER — TAMSULOSIN HCL 0.4 MG PO CAPS
0.4000 mg | ORAL_CAPSULE | Freq: Every day | ORAL | Status: DC
  Administered 2018-03-02 – 2018-03-04 (×4): 0.4 mg via ORAL
  Filled 2018-03-01 (×4): qty 1

## 2018-03-01 MED ORDER — SENNOSIDES-DOCUSATE SODIUM 8.6-50 MG PO TABS: 1.0000 | ORAL_TABLET | Freq: Every evening | ORAL | Status: DC | PRN

## 2018-03-01 MED ORDER — VITAMIN D 1000 UNITS PO TABS
2000.0000 [IU] | ORAL_TABLET | Freq: Every day | ORAL | Status: DC
  Administered 2018-03-02 – 2018-03-04 (×3): 2000 [IU] via ORAL
  Filled 2018-03-01 (×3): qty 2

## 2018-03-01 MED ORDER — ONDANSETRON HCL 4 MG/2ML IJ SOLN: 4.0000 mg | Freq: Four times a day (QID) | INTRAMUSCULAR | Status: DC | PRN

## 2018-03-01 MED ORDER — CALCITRIOL 0.25 MCG PO CAPS
0.2500 ug | ORAL_CAPSULE | Freq: Every day | ORAL | Status: DC
  Administered 2018-03-02 – 2018-03-04 (×3): 0.25 ug via ORAL
  Filled 2018-03-01 (×3): qty 1

## 2018-03-01 MED ORDER — FAMOTIDINE 20 MG PO TABS
40.0000 mg | ORAL_TABLET | Freq: Every day | ORAL | Status: DC
  Administered 2018-03-02 – 2018-03-03 (×3): 40 mg via ORAL
  Filled 2018-03-01 (×3): qty 2

## 2018-03-01 MED ORDER — TORSEMIDE 20 MG PO TABS
20.0000 mg | ORAL_TABLET | Freq: Every day | ORAL | Status: DC
  Administered 2018-03-02 – 2018-03-03 (×3): 20 mg via ORAL
  Filled 2018-03-01 (×3): qty 1

## 2018-03-01 MED ORDER — HYDRALAZINE HCL 50 MG PO TABS
50.0000 mg | ORAL_TABLET | Freq: Three times a day (TID) | ORAL | Status: DC
  Administered 2018-03-02 – 2018-03-04 (×8): 50 mg via ORAL
  Filled 2018-03-01 (×8): qty 1

## 2018-03-01 MED ORDER — OXYCODONE HCL 5 MG PO TABS: 10.0000 mg | ORAL_TABLET | Freq: Four times a day (QID) | ORAL | Status: DC | PRN

## 2018-03-01 MED ORDER — ZOLPIDEM TARTRATE 5 MG PO TABS: 5.0000 mg | ORAL_TABLET | Freq: Every evening | ORAL | Status: DC | PRN

## 2018-03-01 MED ORDER — MONTELUKAST SODIUM 10 MG PO TABS
10.0000 mg | ORAL_TABLET | Freq: Every day | ORAL | Status: DC
  Administered 2018-03-02 – 2018-03-03 (×3): 10 mg via ORAL
  Filled 2018-03-01 (×3): qty 1

## 2018-03-01 MED ORDER — ACETAMINOPHEN 650 MG RE SUPP: 650.0000 mg | Freq: Four times a day (QID) | RECTAL | Status: DC | PRN

## 2018-03-01 MED ORDER — AMITRIPTYLINE HCL 25 MG PO TABS
12.5000 mg | ORAL_TABLET | Freq: Every day | ORAL | Status: DC
  Administered 2018-03-02 – 2018-03-03 (×3): 12.5 mg via ORAL
  Filled 2018-03-01 (×3): qty 1

## 2018-03-01 MED ORDER — INSULIN ASPART 100 UNIT/ML ~~LOC~~ SOLN
0.0000 [IU] | Freq: Three times a day (TID) | SUBCUTANEOUS | Status: DC
  Administered 2018-03-02: 2 [IU] via SUBCUTANEOUS
  Administered 2018-03-02: 1 [IU] via SUBCUTANEOUS
  Administered 2018-03-03: 3 [IU] via SUBCUTANEOUS
  Administered 2018-03-03 – 2018-03-04 (×2): 2 [IU] via SUBCUTANEOUS
  Administered 2018-03-04: 1 [IU] via SUBCUTANEOUS

## 2018-03-01 MED ORDER — SODIUM CHLORIDE 0.9 % IV SOLN
2.0000 g | INTRAVENOUS | Status: DC
  Administered 2018-03-02: 2 g via INTRAVENOUS
  Filled 2018-03-01 (×2): qty 20

## 2018-03-01 MED ORDER — ONDANSETRON HCL 4 MG PO TABS: 4.0000 mg | ORAL_TABLET | Freq: Four times a day (QID) | ORAL | Status: DC | PRN

## 2018-03-01 MED ORDER — HYDRALAZINE HCL 20 MG/ML IJ SOLN: 5.0000 mg | INTRAMUSCULAR | Status: DC | PRN

## 2018-03-01 MED ORDER — SIMVASTATIN 40 MG PO TABS
40.0000 mg | ORAL_TABLET | Freq: Every day | ORAL | Status: DC
  Administered 2018-03-02 – 2018-03-03 (×3): 40 mg via ORAL
  Filled 2018-03-01 (×3): qty 1

## 2018-03-01 MED ORDER — CARVEDILOL 6.25 MG PO TABS
6.2500 mg | ORAL_TABLET | Freq: Two times a day (BID) | ORAL | Status: DC
  Administered 2018-03-02 – 2018-03-04 (×5): 6.25 mg via ORAL
  Filled 2018-03-01 (×5): qty 1

## 2018-03-01 MED ORDER — CEPHALEXIN 500 MG PO CAPS: 500.0000 mg | ORAL_CAPSULE | Freq: Every day | ORAL | 0 refills | Status: DC

## 2018-03-01 MED ORDER — ASPIRIN EC 81 MG PO TBEC
81.0000 mg | DELAYED_RELEASE_TABLET | Freq: Every day | ORAL | Status: DC
  Administered 2018-03-02 – 2018-03-04 (×3): 81 mg via ORAL
  Filled 2018-03-01 (×3): qty 1

## 2018-03-01 MED ORDER — MAGNESIUM OXIDE 400 (241.3 MG) MG PO TABS
400.0000 mg | ORAL_TABLET | Freq: Every day | ORAL | Status: DC
  Administered 2018-03-02 – 2018-03-04 (×3): 400 mg via ORAL
  Filled 2018-03-01 (×3): qty 1

## 2018-03-01 NOTE — ED Notes (Signed)
ED Provider at bedside. 

## 2018-03-01 NOTE — ED Provider Notes (Signed)
[facility] EMERGENCY DEPARTMENT Provider Note   CSN: [id number] Arrival date & time: 02/28/18  2301     History   Chief Complaint Chief Complaint  Patient presents with  . Urinary Retention    HPI Kent Osborne is a 67 y.o. male.  The history is provided by the patient and a relative.  Hematuria  This is a new problem. The current episode started 6 to 12 hours ago. The problem occurs constantly. The problem has been rapidly worsening. Associated symptoms include abdominal pain. Nothing aggravates the symptoms. Nothing relieves the symptoms.  pt reports onset of difficulty urinating over 12 hrs ago Seen at outside hospital, had foley placed and felt improved Soon after when he left he starting having difficulty urinating again No fever/vomiting No new back pain No new weakness No incontinence He has known renal failure, not on dialysis but told his GFR is worsening He reports recent episodes of hematuria and is supposed to see urology next week   Past Medical History:  Diagnosis Date  . Acute renal failure superimposed on chronic kidney disease ([place])    [name] 11/01/2016  . Anemia   . Arthritis    "back, knees" (11/01/2016)  . Asthma   . CAD (coronary artery disease) 12/31/07   [facility] stents- placed in the circumflex and LAD  . Chronic bronchitis ([place])   . History of blood transfusion 10/30/2016   "related to anemia"  . History of gout   . History of kidney stones   . Hyperlipidemia   . Hypertension   . NSTEMI (non-ST elevated myocardial infarction) ([place]) 10/30/2016  . OA (osteoarthritis)   . Obese   . On home oxygen therapy    "2L w/CPAP" (11/01/2016)  . OSA on CPAP   . Psoriatic arthritis ([place])   . Type II diabetes mellitus [facility])     Patient Active Problem List   Diagnosis Date Noted  . BPH associated with nocturia 12/24/2017  . Elevated troponin 11/01/2016  . NSTEMI (non-ST elevated myocardial infarction) ([place])  11/01/2016  . Acute renal failure superimposed on chronic kidney disease ([place]) 11/01/2016  . Diabetes mellitus with complication ([place]) 51/70/0174  . COPD (chronic obstructive pulmonary disease) ([place]) 11/01/2016  . OSA (obstructive sleep apnea) 11/01/2016  . Symptomatic anemia 10/30/2016  . Morbid obesity with BMI of 45.0-49.9, adult ([place]) 04/29/2016  . Gout, arthropathy 04/27/2016  . Diabetic peripheral neuropathy associated with type 2 diabetes mellitus ([place]) 08/20/2014  . Type II diabetes mellitus with circulatory disorder, uncontrolled 07/09/2014  . Renal insufficiency 02/29/2012  . HEMATURIA 07/27/2010  . GOUT, UNSPECIFIED 07/05/2010  . BACK PAIN, LUMBAR 04/18/2010  . Backache 01/06/2010  . GENERALIZED OSTEOARTHROSIS INVOLVING HAND 07/20/2009  . Chronic kidney disease (CKD), stage IV (severe) ([place]) 04/12/2009  . PERIPHERAL EDEMA 12/31/2008  . Asthma with acute exacerbation 10/08/2008  . Osteoarthritis 07/28/2008  . Hyperlipemia 01/06/2008  . Coronary atherosclerosis 01/06/2008  . Chronic ischemic heart disease 12/31/2007  . Essential hypertension 02/21/2007  . CHOLELITHIASIS 02/21/2007  . NEPHROLITHIASIS, HX OF 02/21/2007    Past Surgical History:  Procedure Laterality Date  . AV FISTULA PLACEMENT    . CORONARY ANGIOPLASTY WITH STENT PLACEMENT  2009   "3 stents"        Home Medications    Prior to Admission medications   Medication Sig Start Date End Date Taking? Authorizing Provider  albuterol (PROVENTIL HFA;VENTOLIN HFA) 108 (90 Base) MCG/ACT inhaler Inhale 2 puffs into the lungs every 6 (six)  hours as needed for wheezing or shortness of breath. 10/30/16   Martinique, Betty G, MD  allopurinol (ZYLOPRIM) 300 MG tablet TAKE 1/2 (ONE-HALF) TABLET BY MOUTH ONCE DAILY 06/22/17   Martin[place], Betty G, MD  amitriptyline (ELAVIL) 10 MG tablet Take 10 mg by mouth.    Arcedo, Perico, DO  aspirin EC 81 MG tablet Take 81 mg by mouth daily.    [provider]  calcitRIOL  (ROCALTROL) 0.25 MCG capsule Take 0.25 mcg by mouth daily.    [provider]  carvedilol (COREG) 6.25 MG tablet TAKE 1 TABLET BY MOUTH TWICE DAILY WITH A MEAL 08/17/17   [place], Betty G, MD  colchicine 0.6 MG tablet Take 1 tablet (0.6 mg total) by mouth daily as needed (for gout flares). 11/03/16   Rai, Ripudeep K, MD  glipiZIDE (GLUCOTROL) 10 MG tablet TAKE 1 TABLET BY MOUTH ONCE DAILY BEFORE BREAKFAST 11/09/17   [place], Betty G, MD  glucose blood (ONE TOUCH ULTRA TEST) test strip USE ONE STRIP TO CHECK GLUCOSE 4 TIMES DAILY 11/22/17   [place], Betty G, MD  guaiFENesin (MUCINEX) 600 MG 12 hr tablet Take 1 tablet (600 mg total) by mouth every 12 (twelve) hours as needed for to loosen phlegm. 11/03/16   Rai, Vernelle Emerald, MD  hydrALAZINE (APRESOLINE) 50 MG tablet Take 1 tablet (50 mg total) by mouth 3 (three) times daily. 03/09/17   [place], Betty G, MD  isosorbide mononitrate (IMDUR) 60 MG 24 hr tablet TAKE 1 TABLET BY MOUTH ONCE DAILY 12/14/17   [place], Betty G, MD  magnesium oxide (MAG-OX) 400 MG tablet Take 400 mg by mouth daily.    [provider]  metolazone (ZAROXOLYN) 2.5 MG tablet Take 2.5 mg by mouth daily as needed (fluid).    [provider]  montelukast (SINGULAIR) 10 MG tablet Take 10 mg by mouth at bedtime.     [provider]  nitroGLYCERIN (NITROSTAT) 0.4 MG SL tablet Place 1 tablet (0.4 mg total) under the tongue every 5 (five) minutes as needed for chest pain. 11/03/16   Rai, Ripudeep K, MD  NOVOLOG FLEXPEN 100 UNIT/ML FlexPen  INJECT 25 UNITS INTO THE SKIN THREE TIMES DAILY USING A SLIDING SCALE 11/09/17   [place], Betty G, MD  Oxycodone HCl 10 MG TABS Take 10 mg by mouth 4 (four) times daily as needed.    Arcedo, Perico, DO  prasugrel (EFFIENT) 10 MG TABS Take 10 mg by mouth daily.      [provider]  ranitidine (ZANTAC) 300 MG tablet TAKE 1 TABLET BY MOUTH AT BEDTIME 01/28/18   [place], Betty G, MD  simvastatin (ZOCOR) 40 MG tablet Take 1 tablet  (40 mg total) by mouth at bedtime. 03/09/17   [place], Betty G, MD  tamsulosin [facility]) 0.4 MG CAPS capsule  11/13/17   [provider]  torsemide (DEMADEX) 20 MG tablet TAKE ONE TABLET BY MOUTH TWICE DAILY 08/10/14   Dorena Cookey, MD  TRESIBA FLEXTOUCH 100 UNIT/ML SOPN FlexTouch Pen INJECT 50 UNITS SUBCUTANEOUSLY ONCE DAILY AT  10  PM 02/18/18   [place], Betty G, MD  Vitamin D, Ergocalciferol, (DRISDOL) 50000 units CAPS capsule Take 50,000 Units by mouth every 7 (seven) days.    [provider]    Family History Family History  Problem Relation Age of Onset  . Diabetes Other   . Hyperlipidemia Other   . Hypertension Other     Social History Social History   Tobacco Use  . Smoking  status: Never Smoker  . Smokeless tobacco: Never Used  Substance Use Topics  . Alcohol use: Not Currently    Comment: 11/01/2016 "nothing in years"  . Drug use: No     Allergies   Patient has no known allergies.   Review of Systems Review of Systems  Constitutional: Negative for fever.  Gastrointestinal: Positive for abdominal pain.  Genitourinary: Positive for hematuria.  Musculoskeletal: Negative for back pain.  All other systems reviewed and are negative.    Physical Exam Updated Vital Signs BP (!) 168/80   Pulse 94   Temp 98.9 F (37.2 C) (Oral)   Resp 18   Ht 1.702 m (5\' 7" )   Wt 133.8 kg (295 lb)   SpO2 94%   BMI 46.20 kg/m   Physical Exam CONSTITUTIONAL: chronically ill appearing HEAD: Normocephalic/atraumatic EYES: EOMI ENMT: Mucous membranes moist NECK: supple no meningeal signs SPINE/BACK:entire spine nontender CV: S1/S2 noted, no murmurs/rubs/gallops noted LUNGS: Lungs are clear to auscultation bilaterally, no apparent distress ABDOMEN: soft, suprapubic tenderness, obese, no rebound or guarding, bowel sounds noted throughout abdomen TI:WPYKD cath in place, empty foley bag.  No overlying erythema.   Rectal  Decreased tone, prostate palpable and mildly  enlarged No blood/melena, no masses, nurse present for exam NEURO: Pt is awake/alert/appropriate, moves all extremitiesx4.  No facial droop.  No focal weakness noted in LE EXTREMITIES: pulses normal/equal, full ROM SKIN: warm, color normal PSYCH:anxious  ED Treatments / Results  Labs (all labs ordered are listed, but only abnormal results are displayed) Labs Reviewed  URINALYSIS, ROUTINE W REFLEX MICROSCOPIC - Abnormal; Notable for the following components:      Result Value   Color, Urine RED (*)    APPearance TURBID (*)    Glucose, UA   (*)    Value: TEST NOT REPORTED DUE TO COLOR INTERFERENCE OF URINE PIGMENT   Hgb urine dipstick   (*)    Value: TEST NOT REPORTED DUE TO COLOR INTERFERENCE OF URINE PIGMENT   Bilirubin Urine   (*)    Value: TEST NOT REPORTED DUE TO COLOR INTERFERENCE OF URINE PIGMENT   Ketones, ur   (*)    Value: TEST NOT REPORTED DUE TO COLOR INTERFERENCE OF URINE PIGMENT   Protein, ur   (*)    Value: TEST NOT REPORTED DUE TO COLOR INTERFERENCE OF URINE PIGMENT   Nitrite   (*)    Value: TEST NOT REPORTED DUE TO COLOR INTERFERENCE OF URINE PIGMENT   Leukocytes, UA   (*)    Value: TEST NOT REPORTED DUE TO COLOR INTERFERENCE OF URINE PIGMENT   RBC / HPF >50 (*)    Bacteria, UA RARE (*)    All other components within normal limits  BASIC METABOLIC PANEL - Abnormal; Notable for the following components:   Glucose, Bld 122 (*)    BUN 115 (*)    Creatinine, Ser 7.13 (*)    GFR calc non Af Amer 7 (*)    GFR calc Af Amer 8 (*)    Anion gap 16 (*)    All other components within normal limits  CBC - Abnormal; Notable for the following components:   WBC 14.5 (*)    RBC 3.25 (*)    Hemoglobin 9.6 (*)    HCT 30.5 (*)    All other components within normal limits    EKG None  Radiology No results found.  Procedures Procedures (including critical care time)  Medications Ordered in ED Medications  oxyCODONE-acetaminophen (PERCOCET/ROXICET)  5-325 MG per  tablet 1 tablet (1 tablet Oral Given 02/28/18 2328)     Initial Impression / Assessment and Plan / ED Course  I have reviewed the triage vital signs and the nursing notes.  Pertinent labs  results that were available during my care of the patient were reviewed by me and considered in my medical decision making (see chart for details).    1:21 AM  Foley was irrigated, now flowing bloody urine and pt improved Labs pending  2:35 AM Improved, no acute distress. Foley is flowing.  Patient feels comfortable for discharge.  Labs are at baseline with known renal failure.  Will start on antibiotics with renal dosing due to indwelling catheter.  He has urology follow-up next week. Final Clinical Impressions(s) / ED Diagnoses   Final diagnoses:  Urinary retention  Gross hematuria    ED Discharge Orders        Ordered    cephALEXin (KEFLEX) 500 MG capsule  Daily     03/01/18 0234       Ripley Fraise, MD 03/01/18 [phone]

## 2018-03-01 NOTE — ED Triage Notes (Signed)
THE PT WAS HERE YESTERDAY AFTER GETTINGA FOLEY CATH PLACED AT [facility]  HE CAME HERE BECAUSE HE WAS HAVING MORE PAIN  TODAY HE WAS NOT HAVING PAIN UNTIL THIS AFTERNOON  HE FEELS LIKE  HIS CATHETER NEEDS IRRIGATING.  THE CATHETER IS NOT DRAINING AS WELL AND HIS URINE IS BLOODY.  HE CALLED HIS UROLOGIST TODAY BUT HE WAS OUT-OF TOWN SO HE CAME BACK HERE

## 2018-03-01 NOTE — ED Notes (Signed)
Pt's foley was irrigated with 50 cc of sterile water.  Dark red blood returned noted.  250 ML out in basin.  Re attached to leg bag.

## 2018-03-01 NOTE — ED Provider Notes (Signed)
[facility] EMERGENCY DEPARTMENT Provider Note   CSN: [id number] Arrival date & time: 03/01/18  1734  History   Chief Complaint Chief Complaint  Patient presents with  . Hematuria   HPI  Patient is a 67 year old male with history of CAD, CKD, and COPD presenting to the ED for hematuria and lower abdominal discomfort. He was seen at [facility] ED last night for hematuria and 2. [address]amping [address] Foley catheter. He then came here due to inadequate relief and underwent irrigation of his Foley with good relief of his pain. This morning, has had gradual worsening of his suprapubic discomfort with hematuria and decreased urine output. No vomiting or diarrhea. No fever. He was prescribed Keflex last night which he has begun taking.   Past Medical History:  Diagnosis Date  . Acute renal failure superimposed on chronic kidney disease ([place])    [name] 11/01/2016  . Anemia   . Arthritis    "back, knees" (11/01/2016)  . Asthma   . CAD (coronary artery disease) 12/31/07   [facility] stents- placed in the circumflex and LAD  . Chronic bronchitis ([place])   . History of blood transfusion 10/30/2016   "related to anemia"  . History of gout   . History of kidney stones   . Hyperlipidemia   . Hypertension   . NSTEMI (non-ST elevated myocardial infarction) ([place]) 10/30/2016  . OA (osteoarthritis)   . Obese   . On home oxygen therapy    "2L w/CPAP" (11/01/2016)  . OSA on CPAP   . Psoriatic arthritis ([place])   . Type II diabetes mellitus [facility])     Patient Active Problem List   Diagnosis Date Noted  . Type II diabetes mellitus with renal manifestations ([place]) 03/01/2018  . GERD (gastroesophageal reflux disease) 03/01/2018  . Depression 03/01/2018  . Urinary retention 03/01/2018  . Normocytic anemia 03/01/2018  . BPH associated with nocturia 12/24/2017  . Elevated troponin 11/01/2016  . NSTEMI (non-ST elevated myocardial infarction) ([place]) 11/01/2016  . Acute renal failure  superimposed on stage 4 chronic kidney disease ([place]) 11/01/2016  . Diabetes mellitus with complication ([place]) 25/95/6387  . COPD (chronic obstructive pulmonary disease) ([place]) 11/01/2016  . OSA (obstructive sleep apnea) 11/01/2016  . Symptomatic anemia 10/30/2016  . Morbid obesity with BMI of 45.0-49.9, adult ([place]) 04/29/2016  . Gout, arthropathy 04/27/2016  . Diabetic peripheral neuropathy associated with type 2 diabetes mellitus ([place]) 08/20/2014  . Type II diabetes mellitus with circulatory disorder, uncontrolled 07/09/2014  . Renal insufficiency 02/29/2012  . HEMATURIA 07/27/2010  . GOUT, UNSPECIFIED 07/05/2010  . BACK PAIN, LUMBAR 04/18/2010  . Backache 01/06/2010  . GENERALIZED OSTEOARTHROSIS INVOLVING HAND 07/20/2009  . Chronic kidney disease (CKD), stage IV (severe) ([place]) 04/12/2009  . PERIPHERAL EDEMA 12/31/2008  . Asthma with acute exacerbation 10/08/2008  . Osteoarthritis 07/28/2008  . Hyperlipemia 01/06/2008  . Coronary atherosclerosis 01/06/2008  . Chronic ischemic heart disease 12/31/2007  . Essential hypertension 02/21/2007  . CHOLELITHIASIS 02/21/2007  . NEPHROLITHIASIS, HX OF 02/21/2007    Past Surgical History:  Procedure Laterality Date  . AV FISTULA PLACEMENT    . CORONARY ANGIOPLASTY WITH STENT PLACEMENT  2009   "3 stents"        Home Medications    Prior to Admission medications   Medication Sig Start Date End Date Taking? Authorizing Provider  amitriptyline (ELAVIL) 25 MG tablet Take 12.5 mg by mouth at bedtime.   Yes [provider]  aspirin EC 81 MG tablet Take 81  mg by mouth daily.   Yes [provider]  calcitRIOL (ROCALTROL) 0.25 MCG capsule Take 0.25 mcg by mouth daily.   Yes [provider]  carvedilol (COREG) 6.25 MG tablet TAKE 1 TABLET BY MOUTH TWICE DAILY WITH  MEALS 03/01/18  Yes [place], Betty G, MD  cephALEXin (KEFLEX) 500 MG capsule Take 1 capsule (500 mg total) by mouth daily. 03/01/18  Yes Ripley Fraise, MD    Cholecalciferol (VITAMIN D) 2000 units tablet Take 2,000 Units by mouth daily.   Yes [provider]  glipiZIDE (GLUCOTROL) 10 MG tablet TAKE 1 TABLET BY MOUTH ONCE DAILY BEFORE BREAKFAST 11/09/17  Yes [place], Betty G, MD  hydrALAZINE (APRESOLINE) 50 MG tablet Take 1 tablet (50 mg total) by mouth 3 (three) times daily. 03/09/17  Yes [place], Betty G, MD  isosorbide mononitrate (IMDUR) 60 MG 24 hr tablet TAKE 1 TABLET BY MOUTH ONCE DAILY 12/14/17  Yes [place], Betty G, MD  magnesium oxide (MAG-OX) 400 MG tablet Take 400 mg by mouth daily.   Yes [provider]  metolazone (ZAROXOLYN) 2.5 MG tablet Take 2.5 mg by mouth as needed (fluid).    Yes [provider]  montelukast (SINGULAIR) 10 MG tablet Take 10 mg by mouth at bedtime.    Yes [provider]  nitroGLYCERIN (NITROSTAT) 0.4 MG SL tablet Place 1 tablet (0.4 mg total) under the tongue every 5 (five) minutes as needed for chest pain. 11/03/16  Yes Rai, Ripudeep K, MD  NOVOLOG FLEXPEN 100 UNIT/ML FlexPen  INJECT 25 UNITS INTO THE SKIN THREE TIMES DAILY USING A SLIDING SCALE 11/09/17  Yes [place], Betty G, MD  Oxycodone HCl 10 MG TABS Take 10 mg by mouth 4 (four) times daily as needed (for pain).    Yes Arcedo, Perico, DO  prasugrel (EFFIENT) 10 MG TABS Take 10 mg by mouth daily.     Yes [provider]  ranitidine (ZANTAC) 300 MG tablet TAKE 1 TABLET BY MOUTH AT BEDTIME 01/28/18  Yes [place], Betty G, MD  simvastatin (ZOCOR) 40 MG tablet Take 1 tablet (40 mg total) by mouth at bedtime. 03/09/17  Yes [place], Betty G, MD  torsemide (DEMADEX) 20 MG tablet TAKE ONE TABLET BY MOUTH TWICE DAILY Patient taking differently: TAKE ONE TABLET BY MOUTH DAILY 08/10/14  Yes Dorena Cookey, MD  TRESIBA FLEXTOUCH 100 UNIT/ML SOPN FlexTouch Pen INJECT 50 UNITS SUBCUTANEOUSLY ONCE DAILY AT  10  PM 02/18/18  Yes [place], Betty G, MD  albuterol (PROVENTIL HFA;VENTOLIN HFA) 108 (90 Base) MCG/ACT inhaler Inhale 2 puffs into the lungs  every 6 (six) hours as needed for wheezing or shortness of breath. Patient not taking: Reported on 03/01/2018 10/30/16   [place], Betty G, MD  allopurinol (ZYLOPRIM) 300 MG tablet TAKE 1/2 (ONE-HALF) TABLET BY MOUTH ONCE DAILY Patient not taking: Reported on 03/01/2018 06/22/17   [place], Betty G, MD  glucose blood (ONE TOUCH ULTRA TEST) test strip USE ONE STRIP TO CHECK GLUCOSE 4 TIMES DAILY 11/22/17   [place], Betty G, MD  guaiFENesin (MUCINEX) 600 MG 12 hr tablet Take 1 tablet (600 mg total) by mouth every 12 (twelve) hours as needed for to loosen phlegm. Patient not taking: Reported on 03/01/2018 11/03/16   Mendel Corning, MD    Family History Family History  Problem Relation Age of Onset  . Diabetes Other   . Hyperlipidemia Other   . Hypertension Other     Social History Social History   Tobacco Use  .  Smoking status: Never Smoker  . Smokeless tobacco: Never Used  Substance Use Topics  . Alcohol use: Not Currently    Comment: 11/01/2016 "nothing in years"  . Drug use: No     Allergies   Patient has no known allergies.   Review of Systems Review of Systems  Constitutional: Negative for fever.  HENT: Negative for congestion.   Eyes: Negative for visual disturbance.  Respiratory: Negative for cough and shortness of breath.   Cardiovascular: Negative for chest pain.  Gastrointestinal: Positive for abdominal pain. Negative for diarrhea and vomiting.  Genitourinary: Positive for hematuria. Negative for dysuria.  Musculoskeletal: Negative for back pain.  Skin: Negative for rash.  Neurological: Negative for headaches.  All other systems reviewed and are negative.    Physical Exam Updated Vital Signs BP (!) 168/102 (BP Location: Left Arm)   Pulse 67   Temp 97.6 F (36.4 C)   Resp 16   Ht 5\' 7"  (1.702 m)   Wt 133.8 kg (295 lb)   SpO2 100%   BMI 46.20 kg/m   Physical Exam  Constitutional: He is oriented to person, place, and time. No distress.  HENT:  Head:  Normocephalic and atraumatic.  Mouth/Throat: Oropharynx is clear and moist.  Eyes: Pupils are equal, round, and reactive to light.  Neck: Neck supple. No JVD present.  Cardiovascular: Normal rate, regular rhythm, normal heart sounds and intact distal pulses.  No murmur heard. Pulmonary/Chest: Breath sounds normal. No respiratory distress. He has no wheezes. He has no rales.  Abdominal: Soft. He exhibits no distension and no mass. There is no tenderness. There is no guarding.  Obese  Genitourinary:  Genitourinary Comments: 16-Fr Foley in place  Musculoskeletal: Normal range of motion. He exhibits edema (1+ BLE symmetric pitting edema).  Neurological: He is alert and oriented to person, place, and time.  Skin: Skin is warm and dry.  Psychiatric: He has a normal mood and affect. His behavior is normal.  Nursing note and vitals reviewed.    ED Treatments / Results  Labs (all labs ordered are listed, but only abnormal results are displayed) Labs Reviewed  CBC WITH DIFFERENTIAL/PLATELET - Abnormal; Notable for the following components:      Result Value   RBC 2.95 (*)    Hemoglobin 8.8 (*)    HCT 27.5 (*)    All other components within normal limits  BASIC METABOLIC PANEL - Abnormal; Notable for the following components:   Glucose, Bld 137 (*)    BUN 124 (*)    Creatinine, Ser 8.12 (*)    Calcium 8.5 (*)    GFR calc non Af Amer 6 (*)    GFR calc Af Amer 7 (*)    All other components within normal limits  URINE CULTURE  CULTURE, BLOOD (ROUTINE X 2)  CULTURE, BLOOD (ROUTINE X 2)  HIV ANTIBODY (ROUTINE TESTING)  BASIC METABOLIC PANEL  CBC    EKG None  Radiology No results found.  Procedures Procedures (including critical care time)   Initial Impression / Assessment and Plan / ED Course  I have reviewed the triage vital signs and the nursing notes.  Pertinent labs & imaging results that were available during my care of the patient were reviewed by me and considered in  my medical decision making (see chart for details).  This is a 67 year old male presenting to the ED for suprapubic discomfort to the setting of recent hematuria and Foley placement as above. Patient with minimal urine drainage bag.  I attempted to visualize bilaterally with ultrasound but unable to due to body habitus. Three-way catheter placed and bladder irrigation initiated. Screening labs show worsening renal failure but no emergent electrolyte. Do not feel emergent HD indicated. Case discussed with on-call urologist Dr. Tresa Moore who agrees with current management. Patient admitted to hospitalist for further irritation and likely Foley/irrigation education for home care.  Final Clinical Impressions(s) / ED Diagnoses   Final diagnoses:  Hematuria, unspecified type  Urinary retention      Prescilla Sours, MD 03/02/18 0007    Carmin Muskrat, MD 03/02/18 2348

## 2018-03-01 NOTE — ED Notes (Signed)
Pt bladder scan read 29mL.

## 2018-03-01 NOTE — H&P (Addendum)
History and Physical    Kent Osborne VXB:[id number] DOB: 1950/09/28 DOA: 03/01/2018  Referring MD/NP/PA:   PCP: [place], Betty G, MD   Patient coming from:  The patient is coming from home.  At baseline, pt is independent for most of ADL.   Chief Complaint: Hematuria, urinary retention  HPI: Kent Osborne is a 67 y.o. 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  Patient states that he has been having hematuria for almost 3 months with unclear etiology.  He has scheduled to see urologist, but has not been seen yet. He states that he developed difficulty urinating yesterday, and had mild suprapubic pain and pressure feeling. He was seen at [facility] ED last night. He was discharged home after Foley catheter was replaced. Pt was placed on Keflex. Since this AM, he has recurrent difficulty urinating and decreased urine output.  He also has dysuria and burning on urination.  No fever or chills.  Patient states that he is lower abdominal pain has resolved.  Currently no nausea, vomiting, diarrhea or abdominal pain.  Denies chest pain, shortness of breath, cough.  No unilateral weakness.  ED Course: pt was found to have WBC 9.7, worsening renal function with creatinine up from recent baseline of 5-7 to 8/12 and BUN 124, WBC 9.7, pending urinalysis, temperature normal, no tachycardia, no tachypnea, oxygen saturation 95% on 2 L nasal cannula oxygen.  Patient is admitted to telemetry bed as inpatient.  Urology was consulted by EDP (EDP consulted to urology, but EDP did not write down the name, possibl Dr. Tresa Moore?).  Review of Systems:   General: no fevers, chills, no body weight gain, has fatigue HEENT: no blurry vision, hearing changes or  sore throat Respiratory: no dyspnea, coughing, wheezing CV: no chest pain, no palpitations GI: no nausea, vomiting, abdominal pain, diarrhea, constipation GU: has dysuria, burning on urination, no increased urinary frequency, has hematuria and urinary retention. Ext: has leg edema Neuro: no unilateral weakness, numbness, or tingling, no vision change or hearing loss Skin: no rash, no skin tear. MSK: No muscle spasm, no deformity, no limitation of range of movement in spin Heme: No easy bruising.  Travel history: No recent long distant travel.  Allergy: No Known Allergies  Past Medical History:  Diagnosis Date  . Acute renal failure superimposed on chronic kidney disease ([place])    Kent Osborne 11/01/2016  . Anemia   . Arthritis    "back, knees" (11/01/2016)  . Asthma   . CAD (coronary artery disease) 12/31/07   [facility] stents- placed in the circumflex and LAD  . Chronic bronchitis ([place])   . History of blood transfusion 10/30/2016   "related to anemia"  . History of gout   . History of kidney stones   . Hyperlipidemia   . Hypertension   . NSTEMI (non-ST elevated myocardial infarction) ([place]) 10/30/2016  . OA (osteoarthritis)   . Obese   . On home oxygen therapy    "2L w/CPAP" (11/01/2016)  . OSA on CPAP   . Psoriatic arthritis ([place])   . Type II diabetes mellitus ([place])     Past Surgical History:  Procedure Laterality Date  . AV FISTULA PLACEMENT    . CORONARY ANGIOPLASTY WITH STENT PLACEMENT  2009   "3 stents"  Social History:  reports that he has never smoked. He has never used smokeless tobacco. He reports that he drank alcohol. He reports that he does not use drugs.  Family History:  Family History  Problem Relation Age of Onset  . Diabetes Other   . Hyperlipidemia Other   . Hypertension Other      Prior to Admission medications   Medication Sig Start Date End Date Taking? Authorizing Provider  albuterol (PROVENTIL HFA;VENTOLIN HFA) 108 (90 Base) MCG/ACT  inhaler Inhale 2 puffs into the lungs every 6 (six) hours as needed for wheezing or shortness of breath. Patient not taking: Reported on 03/01/2018 10/30/16   [place], Betty G, MD  allopurinol (ZYLOPRIM) 300 MG tablet TAKE 1/2 (ONE-HALF) TABLET BY MOUTH ONCE DAILY Patient not taking: Reported on 03/01/2018 06/22/17   [place], Betty G, MD  amitriptyline (ELAVIL) 25 MG tablet Take 12.5 mg by mouth at bedtime.    [provider]  aspirin EC 81 MG tablet Take 81 mg by mouth daily.    [provider]  calcitRIOL (ROCALTROL) 0.25 MCG capsule Take 0.25 mcg by mouth daily.    [provider]  carvedilol (COREG) 6.25 MG tablet TAKE 1 TABLET BY MOUTH TWICE DAILY WITH  MEALS 03/01/18   [place], Betty G, MD  cephALEXin (KEFLEX) 500 MG capsule Take 1 capsule (500 mg total) by mouth daily. 03/01/18   Ripley Fraise, MD  Cholecalciferol (VITAMIN D) 2000 units tablet Take 2,000 Units by mouth daily.    [provider]  glipiZIDE (GLUCOTROL) 10 MG tablet TAKE 1 TABLET BY MOUTH ONCE DAILY BEFORE BREAKFAST 11/09/17   [place]ique, Betty G, MD  glucose blood (ONE TOUCH ULTRA TEST) test strip USE ONE STRIP TO CHECK GLUCOSE 4 TIMES DAILY 11/22/17   Martinique, Betty G, MD  guaiFENesin (MUCINEX) 600 MG 12 hr tablet Take 1 tablet (600 mg total) by mouth every 12 (twelve) hours as needed for to loosen phlegm. Patient not taking: Reported on 03/01/2018 11/03/16   Rai, Vernelle Emerald, MD  hydrALAZINE (APRESOLINE) 50 MG tablet Take 1 tablet (50 mg total) by mouth 3 (three) times daily. 03/09/17   Martinique, Betty G, MD  isosorbide mononitrate (IMDUR) 60 MG 24 hr tablet TAKE 1 TABLET BY MOUTH ONCE DAILY 12/14/17   [place], Betty G, MD  magnesium oxide (MAG-OX) 400 MG tablet Take 400 mg by mouth daily.    [provider]  metolazone (ZAROXOLYN) 2.5 MG tablet Take 2.5 mg by mouth as needed (fluid).     [provider]  montelukast (SINGULAIR) 10 MG tablet Take 10 mg by mouth at bedtime.     [provider]  nitroGLYCERIN (NITROSTAT) 0.4 MG SL tablet Place 1 tablet (0.4 mg total) under the tongue every 5 (five) minutes as needed for chest pain. 11/03/16   Rai, Ripudeep K, MD  NOVOLOG FLEXPEN 100 UNIT/ML FlexPen  INJECT 25 UNITS INTO THE SKIN THREE TIMES DAILY USING A SLIDING SCALE 11/09/17   Martinique, Betty G, MD  Oxycodone HCl 10 MG TABS Take 10 mg by mouth 4 (four) times daily as needed (for pain).     Arcedo, Perico, DO  prasugrel (EFFIENT) 10 MG TABS Take 10 mg by mouth daily.      [provider]  ranitidine (ZANTAC) 300 MG tablet TAKE 1 TABLET BY MOUTH AT BEDTIME 01/28/18   Martinique, Betty G, MD  simvastatin (ZOCOR) 40 MG tablet Take 1 tablet (40 mg total) by mouth at bedtime. 03/09/17  Martinique, Betty G, MD  torsemide (DEMADEX) 20 MG tablet TAKE ONE TABLET BY MOUTH TWICE DAILY Patient taking differently: TAKE ONE TABLET BY MOUTH DAILY 08/10/14   Dorena Cookey, MD  TRESIBA FLEXTOUCH 100 UNIT/ML SOPN FlexTouch Pen INJECT 50 UNITS SUBCUTANEOUSLY ONCE DAILY AT  10  PM 02/18/18   Martinique, Betty G, MD    Physical Exam: Vitals:   03/01/18 2130 03/01/18 2200 03/01/18 2230 03/01/18 2300  BP: (!) 163/74 (!) 175/70 (!) 168/83 (!) 153/70  Pulse: (!) 59 66 73 66  Resp:      Temp:      TempSrc:      SpO2: 100% 100% 99% 99%  Weight:      Height:       General: Not in acute distress HEENT:       Eyes: PERRL, EOMI, no scleral icterus.       ENT: No discharge from the ears and nose, no pharynx injection, no tonsillar enlargement.        Neck: No JVD, no bruit, no mass felt. Heme: No neck lymph node enlargement. Cardiac: S1/S2, RRR, No murmurs, No gallops or rubs. Respiratory: No rales, wheezing, rhonchi or rubs. GI: Soft, nondistended, nontender, no rebound pain, no organomegaly, BS present. GU: No hematuria Ext: 2+ pitting leg edema bilaterally. 2+DP/PT pulse bilaterally. Musculoskeletal: No joint deformities, No joint redness or warmth, no limitation of ROM in spin. Skin: No  rashes.  Neuro: Alert, oriented X3, cranial nerves II-XII grossly intact, moves all extremities normally.  Psych: Patient is not psychotic, no suicidal or hemocidal ideation.  Labs on Admission: I have personally reviewed following labs and imaging studies  CBC: Recent Labs  Lab 02/28/18 2327 03/01/18 1852  WBC 14.5* 9.7  NEUTROABS  --  7.4  HGB 9.6* 8.8*  HCT 30.5* 27.5*  MCV 93.8 93.2  PLT 202 161   Basic Metabolic Panel: Recent Labs  Lab 02/28/18 2327 03/01/18 1852  NA 138 136  K 4.9 4.9  CL 99 100  CO2 23 22  GLUCOSE 122* 137*  BUN 115* 124*  CREATININE 7.13* 8.12*  CALCIUM 8.9 8.5*   GFR: Estimated Creatinine Clearance: 12 mL/min (A) (by C-G formula based on SCr of 8.12 mg/dL (H)). Liver Function Tests: No results for input(s): AST, ALT, ALKPHOS, BILITOT, PROT, ALBUMIN in the last 168 hours. No results for input(s): LIPASE, AMYLASE in the last 168 hours. No results for input(s): AMMONIA in the last 168 hours. Coagulation Profile: No results for input(s): INR, PROTIME in the last 168 hours. Cardiac Enzymes: No results for input(s): CKTOTAL, CKMB, CKMBINDEX, TROPONINI in the last 168 hours. BNP (last 3 results) No results for input(s): PROBNP in the last 8760 hours. HbA1C: No results for input(s): HGBA1C in the last 72 hours. CBG: No results for input(s): GLUCAP in the last 168 hours. Lipid Profile: No results for input(s): CHOL, HDL, LDLCALC, TRIG, CHOLHDL, LDLDIRECT in the last 72 hours. Thyroid Function Tests: No results for input(s): TSH, T4TOTAL, FREET4, T3FREE, THYROIDAB in the last 72 hours. Anemia Panel: No results for input(s): VITAMINB12, FOLATE, FERRITIN, TIBC, IRON, RETICCTPCT in the last 72 hours. Urine analysis:    Component Value Date/Time   COLORURINE RED (A) 03/01/2018 0125   APPEARANCEUR TURBID (A) 03/01/2018 0125   LABSPEC  03/01/2018 0125    TEST NOT REPORTED DUE TO COLOR INTERFERENCE OF URINE PIGMENT   PHURINE  03/01/2018 0125     TEST NOT REPORTED DUE TO COLOR INTERFERENCE OF URINE PIGMENT  GLUCOSEU (A) 03/01/2018 0125    TEST NOT REPORTED DUE TO COLOR INTERFERENCE OF URINE PIGMENT   GLUCOSEU NEGATIVE 01/08/2018 0843   HGBUR (A) 03/01/2018 0125    TEST NOT REPORTED DUE TO COLOR INTERFERENCE OF URINE PIGMENT   HGBUR 3+ 07/27/2010 0846   BILIRUBINUR (A) 03/01/2018 0125    TEST NOT REPORTED DUE TO COLOR INTERFERENCE OF URINE PIGMENT   BILIRUBINUR n 11/02/2015 1240   KETONESUR (A) 03/01/2018 0125    TEST NOT REPORTED DUE TO COLOR INTERFERENCE OF URINE PIGMENT   PROTEINUR (A) 03/01/2018 0125    TEST NOT REPORTED DUE TO COLOR INTERFERENCE OF URINE PIGMENT   UROBILINOGEN 0.2 01/08/2018 0843   NITRITE (A) 03/01/2018 0125    TEST NOT REPORTED DUE TO COLOR INTERFERENCE OF URINE PIGMENT   LEUKOCYTESUR (A) 03/01/2018 0125    TEST NOT REPORTED DUE TO COLOR INTERFERENCE OF URINE PIGMENT   Sepsis Labs: @LABRCNTIP (procalcitonin:4,lacticidven:4) )No results found for this or any previous visit (from the past 240 hour(s)).   Radiological Exams on Admission: No results found.   EKG: Not done in ED, will get one.   Assessment/Plan Principal Problem:   Urinary retention Active Problems:   Hyperlipemia   Essential hypertension   Coronary atherosclerosis   Acute renal failure superimposed on stage 4 chronic kidney disease (HCC)   COPD (chronic obstructive pulmonary disease) (HCC)   OSA (obstructive sleep apnea)   Type II diabetes mellitus with renal manifestations (HCC)   GERD (gastroesophageal reflux disease)   Depression   Normocytic anemia   Urinary retention and hematuria: Patient's urinary retention is likely due to blood clots secondary to hematuria.  The etiology for hematuria is not clear.  Patient was started with Keflex for possible UTI.  Pending urinalysis.  I think is reasonable to empirically treat patient with antibiotics due to dysuria and burning on urination.  Urology was consulted (EDP consulted to  urology, but EDP did not write down the name, possibl Dr. Harlen Labs see pt in AM.  -will admit to tele bed  -Foley cath-->continous irrigation-->monitoring electrolytes by BMP -rocephin IV -hold Effient due to hematuria -f/u Bx and Ux.  -start flomax  HLD: -zocor  CAD: s/p of stent. No CP.  -hold Effient due to hematuria. -continue ASA , imdur, coreg and zocor  Essential hypertension: -continue coreg, hydralazine, -IV hydralazine as needed  Acute renal failure superimposed on stage 4 chronic kidney disease ([place]): Recent baseline creatinine 5-7, his creatinine is 8.12, BUN 124.  Most likely due to urinary retention. Pt is at high risk of developing fluid overload and shortness of breath, I will continue diuretics and follow-up renal function closely. -Continue metolazone and torsemide -Follow-up renal function by BMP  COPD and asthma: stable. -prn albuterol nebs -Singulair  OSA (obstructive sleep apnea): -CPAP  Type II diabetes mellitus with renal manifestations ([place]): Last A1c 6.1 on 01/08/18, well controled. Patient is taking novology and glipizide at home -SSI  GERD: -Pepcid  Normocytic anemia: Hemoglobin 9.6 on 02/28/2018, today 8.8, slightly dropped, due to hematuria. -Follow-up with CBC  Depression: -continue amitriptyline   DVT ppx: SCD Code Status: Full code Family Communication:  Yes, patient's daughter  at bed side Disposition Plan:  Anticipate discharge back to previous home environment Consults called:  Urology (EDP consulted to urology, but EDP did not write down the name, possibl Dr. Tresa Moore?) Admission status:  Inpatient/tele     Date of Service 03/01/2018    [facility] Pager [phone]  If  7PM-7AM, please contact night-coverage [email or web address] Password [facility] 03/01/2018, 11:49 PM

## 2018-03-02 ENCOUNTER — Encounter (HOSPITAL_COMMUNITY): Payer: Medicare Other

## 2018-03-02 DIAGNOSIS — R339 Retention of urine, unspecified: Secondary | ICD-10-CM

## 2018-03-02 LAB — CBC
HCT: 26.4 % — ABNORMAL LOW (ref 39.0–52.0)
Hemoglobin: 8.5 g/dL — ABNORMAL LOW (ref 13.0–17.0)
MCH: 30.1 pg (ref 26.0–34.0)
MCHC: 32.2 g/dL (ref 30.0–36.0)
MCV: 93.6 fL (ref 78.0–100.0)
PLATELETS: 155 10*3/uL (ref 150–400)
RBC: 2.82 MIL/uL — AB (ref 4.22–5.81)
RDW: 12.8 % (ref 11.5–15.5)
WBC: 10.6 10*3/uL — AB (ref 4.0–10.5)

## 2018-03-02 LAB — GLUCOSE, CAPILLARY
GLUCOSE-CAPILLARY: 195 mg/dL — AB (ref 70–99)
GLUCOSE-CAPILLARY: 216 mg/dL — AB (ref 70–99)
GLUCOSE-CAPILLARY: 90 mg/dL (ref 70–99)
Glucose-Capillary: 111 mg/dL — ABNORMAL HIGH (ref 70–99)
Glucose-Capillary: 123 mg/dL — ABNORMAL HIGH (ref 70–99)

## 2018-03-02 LAB — BASIC METABOLIC PANEL
ANION GAP: 14 (ref 5–15)
BUN: 127 mg/dL — ABNORMAL HIGH (ref 8–23)
CALCIUM: 8.4 mg/dL — AB (ref 8.9–10.3)
CO2: 22 mmol/L (ref 22–32)
CREATININE: 7.65 mg/dL — AB (ref 0.61–1.24)
Chloride: 100 mmol/L (ref 98–111)
GFR calc non Af Amer: 7 mL/min — ABNORMAL LOW (ref >60)
GFR, EST AFRICAN AMERICAN: 8 mL/min — AB (ref >60)
Glucose, Bld: 112 mg/dL — ABNORMAL HIGH (ref 70–99)
Potassium: 4.6 mmol/L (ref 3.5–5.1)
SODIUM: 136 mmol/L (ref 135–145)

## 2018-03-02 LAB — HIV ANTIBODY (ROUTINE TESTING W REFLEX): HIV Screen 4th Generation wRfx: NONREACTIVE

## 2018-03-02 MED ORDER — CEPHALEXIN 250 MG PO CAPS
250.0000 mg | ORAL_CAPSULE | Freq: Two times a day (BID) | ORAL | Status: DC
  Administered 2018-03-02 – 2018-03-04 (×4): 250 mg via ORAL
  Filled 2018-03-02 (×4): qty 1

## 2018-03-02 MED ORDER — INSULIN GLARGINE 100 UNIT/ML ~~LOC~~ SOLN
15.0000 [IU] | Freq: Every day | SUBCUTANEOUS | Status: DC
  Administered 2018-03-02 – 2018-03-03 (×2): 15 [IU] via SUBCUTANEOUS
  Filled 2018-03-02 (×5): qty 0.15

## 2018-03-02 MED ORDER — ALBUTEROL SULFATE (2.5 MG/3ML) 0.083% IN NEBU: 2.5000 mg | INHALATION_SOLUTION | RESPIRATORY_TRACT | Status: DC | PRN

## 2018-03-02 NOTE — Progress Notes (Signed)
CBI was stopped by MD for about an hour.  Started to see some blood in Foley drainage bag.  Started CBI again at a slower rate.

## 2018-03-02 NOTE — Progress Notes (Signed)
Patient arrived to the unit via bed from the emergency department.  Patient is alert and oriented x 4. No complaints of pain.  Skin assessment complete.  No skin issues. Urinary cath in place with continuous irrigation. Educated the patient on how to reach the staff on the unit.  Lowered the bed and placed the call light within reach.  Will continue to monitor the patient

## 2018-03-02 NOTE — Progress Notes (Signed)
Pt places self on and off home cpap unit. RT let pt know if he were to need any help to call. RT will continue to monitor as needed.

## 2018-03-02 NOTE — Consult Note (Signed)
Urology Consult Note   Requesting Attending Physician:  Florencia Reasons, MD Service Providing Consult: Urology  Consulting Attending: Alexis Frock, MD   Reason for Consult:  Gross hematuria  HPI: Kent Osborne is seen in consultation for reasons noted above at the request of Florencia Reasons, MD for evaluation of gross hematuria.  This is a 67 y.o. male with multiple medical comorbidities including nephrolithiasis, CKD IV (AVF recently placed in anticipation of HD), COPD on O2, CAD on anticoagulation as detailed below who presents with gross hematuria and urinary retention.  Patient describes [place] that began ~3 months ago. Review of imaging studies show that he has had recent non-con CT A/P from 12/2017 with evidence of 4.5cm indeterminate left lower pole renal mass/complex cyst. This is relatively stable in size since his last CT scan in 04/2016. He was seen in the ED on the early morning of 7/12 with clot retention where a Foley catheter was placed. He was later discharged with PO Keflex. A urine culture from this visit is pending. He returned last night with a non-draining cathter. A hematuria catheter was placed. His bladder was flushed and CBI was initiated. Urology was consulted for assistance with further management.  Patient denies prior urologic history. Denies fevers/chills, nasuea/vomiting. No dysuria or concern for UTI leading up to this most recent admission. No weight loss.   Past Medical History: Past Medical History:  Diagnosis Date  . Acute renal failure superimposed on chronic kidney disease ([place])    [name] 11/01/2016  . Anemia   . Arthritis    "back, knees" (11/01/2016)  . Asthma   . CAD (coronary artery disease) 12/31/07   [facility] stents- placed in the circumflex and LAD  . Chronic bronchitis ([place])   . History of blood transfusion 10/30/2016   "related to anemia"  . History of gout   . History of kidney stones   . Hyperlipidemia   . Hypertension   . NSTEMI (non-ST  elevated myocardial infarction) ([place]) 10/30/2016  . OA (osteoarthritis)   . Obese   . On home oxygen therapy    "2L w/CPAP" (11/01/2016)  . OSA on CPAP   . Psoriatic arthritis ([place])   . Type II diabetes mellitus ([place])     Past Surgical History:  Past Surgical History:  Procedure Laterality Date  . AV FISTULA PLACEMENT    . CORONARY ANGIOPLASTY WITH STENT PLACEMENT  2009   "3 stents"    Medication: Current Facility-Administered Medications  Medication Dose Route Frequency Provider Last Rate Last Dose  . acetaminophen (TYLENOL) tablet 650 mg  650 mg Oral Q6H PRN Ivor Costa, MD       Or  . acetaminophen (TYLENOL) suppository 650 mg  650 mg Rectal Q6H PRN Ivor Costa, MD      . albuterol (PROVENTIL) (2.5 MG/3ML) 0.083% nebulizer solution 2.5 mg  2.5 mg Nebulization Q4H PRN Ivor Costa, MD      . amitriptyline (ELAVIL) tablet 12.5 mg  12.5 mg Oral QHS Ivor Costa, MD   12.5 mg at 03/02/18 0038  . aspirin EC tablet 81 mg  81 mg Oral Daily Ivor Costa, MD      . calcitRIOL (ROCALTROL) capsule 0.25 mcg  0.25 mcg Oral Daily Ivor Costa, MD      . carvedilol (COREG) tablet 6.25 mg  6.25 mg Oral BID WC Ivor Costa, MD      . cefTRIAXone (ROCEPHIN) 2 g in sodium chloride 0.9 % 100 mL IVPB  2 g Intravenous  Q24H Ivor Costa, MD 200 mL/hr at 03/02/18 0039 2 g at 03/02/18 0039  . cholecalciferol (VITAMIN D) tablet 2,000 Units  2,000 Units Oral Daily Ivor Costa, MD      . famotidine (PEPCID) tablet 40 mg  40 mg Oral QHS Ivor Costa, MD   40 mg at 03/02/18 0035  . hydrALAZINE (APRESOLINE) injection 5 mg  5 mg Intravenous Q2H PRN Ivor Costa, MD      . hydrALAZINE (APRESOLINE) tablet 50 mg  50 mg Oral TID Ivor Costa, MD   50 mg at 03/02/18 0036  . insulin aspart (novoLOG) injection 0-5 Units  0-5 Units Subcutaneous QHS Ivor Costa, MD      . insulin aspart (novoLOG) injection 0-9 Units  0-9 Units Subcutaneous TID WC Ivor Costa, MD      . isosorbide mononitrate (IMDUR) 24 hr tablet 60 mg  60 mg Oral Daily Ivor Costa, MD      . magnesium oxide (MAG-OX) tablet 400 mg  400 mg Oral Daily Ivor Costa, MD      . metolazone (ZAROXOLYN) tablet 2.5 mg  2.5 mg Oral Daily PRN Ivor Costa, MD      . montelukast (SINGULAIR) tablet 10 mg  10 mg Oral QHS Ivor Costa, MD   10 mg at 03/02/18 0038  . nitroGLYCERIN (NITROSTAT) SL tablet 0.4 mg  0.4 mg Sublingual Q5 min PRN Ivor Costa, MD      . ondansetron [facility]) tablet 4 mg  4 mg Oral Q6H PRN Ivor Costa, MD       Or  . ondansetron [facility]) injection 4 mg  4 mg Intravenous Q6H PRN Ivor Costa, MD      . oxyCODONE (Oxy IR/ROXICODONE) immediate release tablet 10 mg  10 mg Oral QID PRN Ivor Costa, MD      . senna-docusate (Senokot-S) tablet 1 tablet  1 tablet Oral QHS PRN Ivor Costa, MD      . simvastatin (ZOCOR) tablet 40 mg  40 mg Oral QHS Ivor Costa, MD   40 mg at 03/02/18 0036  . tamsulosin (FLOMAX) capsule 0.4 mg  0.4 mg Oral Daily Ivor Costa, MD   0.4 mg at 03/02/18 0035  . torsemide (DEMADEX) tablet 20 mg  20 mg Oral Daily Ivor Costa, MD   20 mg at 03/02/18 0036  . zolpidem (AMBIEN) tablet 5 mg  5 mg Oral QHS PRN Ivor Costa, MD        Allergies: No Known Allergies  Social History: Social History   Tobacco Use  . Smoking status: Never Smoker  . Smokeless tobacco: Never Used  Substance Use Topics  . Alcohol use: Not Currently    Comment: 11/01/2016 "nothing in years"  . Drug use: No    Family History Family History  Problem Relation Age of Onset  . Diabetes Other   . Hyperlipidemia Other   . Hypertension Other     Review of Systems 10 systems were reviewed and are negative except as noted specifically in the HPI.  Objective   Vital signs in last 24 hours: BP (!) 154/69 (BP Location: Left Arm)   Pulse 65   Temp 98.3 F (36.8 C) (Oral)   Resp 16   Ht 5\' 7"  (1.702 m)   Wt 133.8 kg (295 lb)   SpO2 99%   BMI 46.20 kg/m   Physical Exam General: NAD, A&O, resting, appropriate HEENT: [place]/AT, EOMI, MMM Pulmonary: Normal work of breathing on  [place] Cardiovascular: HDS, adequate peripheral perfusion Abdomen:  Obese, Soft, NTTP, nondistended GU: Uncircumcised phallus. Testes descended and palpably normal bilaterally. 18Fr 3-way Foley catheter draining clear urine with CBI clamped. No flank pain or palpable masses. Extremities: warm and well perfused Neuro: Appropriate, no focal neurological deficits  Most Recent Labs: Lab Results  Component Value Date   WBC 10.6 (H) 03/02/2018   HGB 8.5 (L) 03/02/2018   HCT 26.4 (L) 03/02/2018   PLT 155 03/02/2018    Lab Results  Component Value Date   NA 136 03/02/2018   K 4.6 03/02/2018   CL 100 03/02/2018   CO2 22 03/02/2018   BUN 127 (H) 03/02/2018   CREATININE 7.65 (H) 03/02/2018   CALCIUM 8.4 (L) 03/02/2018   MG 2.0 11/03/2016    Lab Results  Component Value Date   INR 1.26 11/01/2016   APTT 34 10/30/2016     IMAGING: No results found.  ------  Assessment:  67 y.o. male with gross hematuria and clot retention in the setting of stable 4.5cm left lower pole renal mass/complex cyst.  Hgb stable. Hematuria now resolved with CBI clamped. No complaints at this time.   Recommendations: - If urine remains clear, patient may discharge home this afternoon with Foley catheter in place. Please provide leg bag teaching. - Patient may be provided nursing instruction to flush catheter with 60 mL sterile saline/water if hematuria recurs and there is concern for catheter obstruction while at home. He should contact Urology if this were to occur. - Will defer anticoagulation management to primary team. - Patient will follow up with Dr. Junious Silk of [facility] on 03/04/2018 for diagnostic cystoscopy as part of his gross hematuria work up. - Based on appearance of left renal mass and the likelihood that his gross hematuria is related, left radical nephrectomy may be indicated once he is medically optimized on HD.  Discussed with Dr. Tresa Moore.  Thank you for this consult. Please  contact the urology consult pager with any further questions/concerns.

## 2018-03-02 NOTE — Progress Notes (Signed)
PROGRESS NOTE  Kent Osborne EXN:[id number] DOB: Apr 27, 1951 DOA: 03/01/2018 PCP: Martinique, Betty G, MD  HPI/Recap of past 24 hours:  Gross hematuria cleared up this am with CBI, but re occurred after TBI stopped Now he is back on CBI No fever  He reports chronic lower extremity edema, denies sob, no chest pain He reports generalized weakness for the last few weeks  Assessment/Plan: Principal Problem:   Urinary retention Active Problems:   Hyperlipemia   Essential hypertension   Coronary atherosclerosis   Acute renal failure superimposed on stage 4 chronic kidney disease (HCC)   COPD (chronic obstructive pulmonary disease) (HCC)   OSA (obstructive sleep apnea)   Type II diabetes mellitus with renal manifestations (HCC)   GERD (gastroesophageal reflux disease)   Depression   Normocytic anemia   Gross hematuria/urinary retention -Continue flomax, urine culture in process, he does not appear septic, however wbc was elevated at 14.5, wbc at 10.6 on presentation. he is started on iv rocephin on presentation, will change to keflex to minimize volume -CBI, urology consulted -Monitor hgb, hold off effient  AKI on CKDV -He is nephrology is from [place], he just had AVF placed 2 weeks ago -He does has bilateral lower extremity pitting edema report is chronic, lungs are clear, electrolytes are stable, BUN elevated at 127, he denies confusion, report fatigue for the last few weeks -Home meds demadex continued, metolazone held, renal dosing meds -Repeat labs in a.m., may need to consult nephrology if creatinine does not trend down  Bilateral lower extremity edema R>L Will get venous doppler  Insulin dependent dm2 A1c 6 in May Home meds Tresiba 50 units nightly on hold since admission , he is on SSI  a.m. blood glucose 112 here -We will start on Lantus 15 units nightly   CAD s/p stent in 2009 Denies chest pain -Effusion on hold due to hematuria -He is continued on aspirin  Coreg, statin  HTN Coreg/hydralazine/imdur  Body mass index is 46.2 kg/m. meet criteria for morbid obesity. OSA on cpap and night time o2  Tele with degree AV block, a few PVCs He has been stable. D/c Telemetry  Code Status: full  Family Communication: patient   Disposition Plan: pending clinical improvement   Consultants:  urology  Procedures:  CBI  Antibiotics:  Rocephin x1, then keflex   Objective: BP (!) 154/69 (BP Location: Left Arm)   Pulse 65   Temp 98.3 F (36.8 C) (Oral)   Resp 16   Ht 5\' 7"  (1.702 m)   Wt 133.8 kg (295 lb)   SpO2 99%   BMI 46.20 kg/m   Intake/Output Summary (Last 24 hours) at 03/02/2018 0757 Last data filed at 03/02/2018 4967 Gross per 24 hour  Intake 12336 ml  Output 21595 ml  Net -9259 ml   Filed Weights   03/01/18 1749  Weight: 133.8 kg (295 lb)    Exam: Patient is examined daily including today on 03/02/2018, exams remain the same as of yesterday except that has changed    General:  NAD, obsed  Cardiovascular: RRR  Respiratory: CTABL  Abdomen: Soft/ND/NT, positive BS  Musculoskeletal: bilateral lower extremity pitting Edema, right > left  Neuro: alert, oriented   Data Reviewed: Basic Metabolic Panel: Recent Labs  Lab 02/28/18 2327 03/01/18 1852 03/02/18 0405  NA 138 136 136  K 4.9 4.9 4.6  CL 99 100 100  CO2 23 22 22   GLUCOSE 122* 137* 112*  BUN 115* 124* 127*  CREATININE 7.13*  8.12* 7.65*  CALCIUM 8.9 8.5* 8.4*   Liver Function Tests: No results for input(s): AST, ALT, ALKPHOS, BILITOT, PROT, ALBUMIN in the last 168 hours. No results for input(s): LIPASE, AMYLASE in the last 168 hours. No results for input(s): AMMONIA in the last 168 hours. CBC: Recent Labs  Lab 02/28/18 2327 03/01/18 1852 03/02/18 0405  WBC 14.5* 9.7 10.6*  NEUTROABS  --  7.4  --   HGB 9.6* 8.8* 8.5*  HCT 30.5* 27.5* 26.4*  MCV 93.8 93.2 93.6  PLT 202 166 155   Cardiac Enzymes:   No results for input(s): CKTOTAL,  CKMB, CKMBINDEX, TROPONINI in the last 168 hours. BNP (last 3 results) No results for input(s): BNP in the last 8760 hours.  ProBNP (last 3 results) No results for input(s): PROBNP in the last 8760 hours.  CBG: Recent Labs  Lab 03/02/18 0021  GLUCAP 90    No results found for this or any previous visit (from the past 240 hour(s)).   Studies: No results found.  Scheduled Meds: . amitriptyline  12.5 mg Oral QHS  . aspirin EC  81 mg Oral Daily  . calcitRIOL  0.25 mcg Oral Daily  . carvedilol  6.25 mg Oral BID WC  . cholecalciferol  2,000 Units Oral Daily  . famotidine  40 mg Oral QHS  . hydrALAZINE  50 mg Oral TID  . insulin aspart  0-5 Units Subcutaneous QHS  . insulin aspart  0-9 Units Subcutaneous TID WC  . isosorbide mononitrate  60 mg Oral Daily  . magnesium oxide  400 mg Oral Daily  . montelukast  10 mg Oral QHS  . simvastatin  40 mg Oral QHS  . tamsulosin  0.4 mg Oral Daily  . torsemide  20 mg Oral Daily    Continuous Infusions: . cefTRIAXone (ROCEPHIN)  IV 2 g (03/02/18 0039)     Time spent: 35 mins, case discussed with urology resident I have personally reviewed and interpreted on  03/02/2018 daily labs, tele strips, imagings as discussed above under date review session and assessment and plans.  I reviewed all nursing notes, pharmacy notes, consultant notes,  vitals, pertinent old records  I have discussed plan of care as described above with RN , patient  on 03/02/2018   Florencia Reasons MD, PhD  [facility] Pager [phone]. If 7PM-7AM, please contact night-coverage at [email or web address], password [facility] 03/02/2018, 7:57 AM  LOS: 1 day

## 2018-03-03 ENCOUNTER — Inpatient Hospital Stay (HOSPITAL_COMMUNITY): Payer: Medicare Other

## 2018-03-03 DIAGNOSIS — R609 Edema, unspecified: Secondary | ICD-10-CM

## 2018-03-03 LAB — BASIC METABOLIC PANEL
ANION GAP: 13 (ref 5–15)
BUN: 137 mg/dL — ABNORMAL HIGH (ref 8–23)
CALCIUM: 8.2 mg/dL — AB (ref 8.9–10.3)
CO2: 24 mmol/L (ref 22–32)
Chloride: 101 mmol/L (ref 98–111)
Creatinine, Ser: 8.4 mg/dL — ABNORMAL HIGH (ref 0.61–1.24)
GFR calc non Af Amer: 6 mL/min — ABNORMAL LOW (ref >60)
GFR, EST AFRICAN AMERICAN: 7 mL/min — AB (ref >60)
GLUCOSE: 151 mg/dL — AB (ref 70–99)
Potassium: 4.6 mmol/L (ref 3.5–5.1)
Sodium: 138 mmol/L (ref 135–145)

## 2018-03-03 LAB — HEPATIC FUNCTION PANEL
ALBUMIN: 3.3 g/dL — AB (ref 3.5–5.0)
ALK PHOS: 49 U/L (ref 38–126)
ALT: 10 U/L (ref 0–44)
AST: 17 U/L (ref 15–41)
BILIRUBIN INDIRECT: 0.7 mg/dL (ref 0.3–0.9)
Bilirubin, Direct: 0.2 mg/dL (ref 0.0–0.2)
TOTAL PROTEIN: 6 g/dL — AB (ref 6.5–8.1)
Total Bilirubin: 0.9 mg/dL (ref 0.3–1.2)

## 2018-03-03 LAB — CBC
HEMATOCRIT: 24.8 % — AB (ref 39.0–52.0)
HEMOGLOBIN: 7.9 g/dL — AB (ref 13.0–17.0)
MCH: 30 pg (ref 26.0–34.0)
MCHC: 31.9 g/dL (ref 30.0–36.0)
MCV: 94.3 fL (ref 78.0–100.0)
Platelets: 138 10*3/uL — ABNORMAL LOW (ref 150–400)
RBC: 2.63 MIL/uL — ABNORMAL LOW (ref 4.22–5.81)
RDW: 12.7 % (ref 11.5–15.5)
WBC: 7.5 10*3/uL (ref 4.0–10.5)

## 2018-03-03 LAB — GLUCOSE, CAPILLARY
Glucose-Capillary: 117 mg/dL — ABNORMAL HIGH (ref 70–99)
Glucose-Capillary: 178 mg/dL — ABNORMAL HIGH (ref 70–99)
Glucose-Capillary: 203 mg/dL — ABNORMAL HIGH (ref 70–99)
Glucose-Capillary: 223 mg/dL — ABNORMAL HIGH (ref 70–99)

## 2018-03-03 LAB — PROTIME-INR
INR: 1.36
Prothrombin Time: 16.7 seconds — ABNORMAL HIGH (ref 11.4–15.2)

## 2018-03-03 LAB — URINE CULTURE: CULTURE: NO GROWTH

## 2018-03-03 LAB — PHOSPHORUS: PHOSPHORUS: 6.5 mg/dL — AB (ref 2.5–4.6)

## 2018-03-03 MED ORDER — SODIUM CHLORIDE 0.9 % IV SOLN
INTRAVENOUS | Status: AC
  Administered 2018-03-03: 14:00:00 via INTRAVENOUS

## 2018-03-03 MED ORDER — CALCIUM CARBONATE ANTACID 500 MG PO CHEW
1.0000 | CHEWABLE_TABLET | Freq: Two times a day (BID) | ORAL | Status: DC
  Administered 2018-03-03 – 2018-03-04 (×2): 200 mg via ORAL
  Filled 2018-03-03 (×3): qty 1

## 2018-03-03 NOTE — Progress Notes (Addendum)
PROGRESS NOTE  MIROSLAV GIN XFG:[id number] DOB: 30-Mar-1951 DOA: 03/01/2018 PCP: Martinique, Betty G, MD  HPI/Recap of past 24 hours:  CBI stopped this am at 8:30am, so hematuria so far, he denies bladder spasm No fever  He reports chronic lower extremity edema, denies sob, no chest pain He reports generalized weakness for the last few weeks, he does not appear in acute distress   Assessment/Plan: Principal Problem:   Urinary retention Active Problems:   Hyperlipemia   Essential hypertension   Coronary atherosclerosis   Acute renal failure superimposed on stage 4 chronic kidney disease (HCC)   COPD (chronic obstructive pulmonary disease) (HCC)   OSA (obstructive sleep apnea)   Type II diabetes mellitus with renal manifestations (HCC)   GERD (gastroesophageal reflux disease)   Depression   Normocytic anemia   Gross hematuria/urinary retention -Per patient, he has had hematuria for the last three months -he presented to the [facility] ED on 7/11 due to the same, he was sent home after foley insertion and he is given oral abx. However, he returned to the [facility] [place] on 7/12 due to bladder spasm, decreased foley output, he was sent home after foley irrigation, but returned the same day on 7/12 due to foley not draining and bladder spasm, he is started on CBI and admitted to the hospital. -Continue flomax, urine culture no growth, he does not appear septic, however wbc was elevated at 14.5 on 7/11, wbc at 10.6 on presentation. he is started on iv rocephin on presentation, abx changed to keflex. -CBI stopped this am, urology consulted -Monitor hgb( trending down) , hold off effient, urology is ok with asa -case discussed with urology Dr Tresa Moore, patient can keep foley in and follow up with urology in the clinic, no plan for cystoscope during this hospitalization.  AKI on CKDV -He is nephrology is from [place], he just had AVF placed 2 weeks ago -He does has bilateral lower  extremity pitting edema report is chronic, lungs are clear, electrolytes are stable, BUN elevated at 127, he denies confusion, report fatigue for the last few weeks -bun/cr trend up , nephrology consulted -Home meds demadex continued on admission, this is stopped on 7/14 per nephrology recommendation, metolazone held since admission, renal dosing meds -he is started on gentle hydration per nephrology recommendation -Repeat labs in a.m, will follow nephrology recommendation    Bilateral lower extremity edema R>L  venous doppler negative for DVT  Acute on chronic anemia: He has baseline anemia of chronic disease Acute drop likely due to hematuria hgb around 9-10, hgb on presentation was 8.8, today is 7.9, hematuria appear has stopped Repeat cbc in am, transfuse if needed  Insulin dependent dm2 A1c 6 in May Home meds Tresiba 50 units nightly on hold since admission , he is on SSI  a.m. blood glucose 112 here -We will start on Lantus 15 units nightly   CAD s/p stent in 2009 Denies chest pain -Effusion on hold due to hematuria -He is continued on aspirin, Coreg, statin  HTN Coreg/hydralazine/imdur  Body mass index is 46.2 kg/m. meet criteria for morbid obesity. OSA on cpap and night time o2  Tele with degree AV block, a few PVCs He has been stable. D/c Telemetry  Code Status: full  Family Communication: patient   Disposition Plan: pending bun/cr, need nephrology clearance   Consultants:  urology  Procedures:  CBI  Antibiotics:  Rocephin x1, then keflex   Objective: BP (!) 127/40 (BP Location: Left Arm)  Pulse 61   Temp 97.9 F (36.6 C)   Resp 18   Ht 5\' 7"  (1.702 m)   Wt 133.8 kg (295 lb)   SpO2 100%   BMI 46.20 kg/m   Intake/Output Summary (Last 24 hours) at 03/03/2018 0816 Last data filed at 03/03/2018 0551 Gross per 24 hour  Intake 6460 ml  Output 12675 ml  Net -6215 ml   Filed Weights   03/01/18 1749  Weight: 133.8 kg (295 lb)     Exam: Patient is examined daily including today on 03/03/2018, exams remain the same as of yesterday except that has changed    General:  NAD, obsed  Cardiovascular: RRR  Respiratory: CTABL  Abdomen: Soft/ND/NT, positive BS  Musculoskeletal: bilateral lower extremity pitting Edema, right > left  Neuro: alert, oriented   Data Reviewed: Basic Metabolic Panel: Recent Labs  Lab 02/28/18 2327 03/01/18 1852 03/02/18 0405 03/03/18 0456  NA 138 136 136 138  K 4.9 4.9 4.6 4.6  CL 99 100 100 101  CO2 23 22 22 24   GLUCOSE 122* 137* 112* 151*  BUN 115* 124* 127* 137*  CREATININE 7.13* 8.12* 7.65* 8.40*  CALCIUM 8.9 8.5* 8.4* 8.2*   Liver Function Tests: Recent Labs  Lab 03/03/18 0456  AST 17  ALT 10  ALKPHOS 49  BILITOT 0.9  PROT 6.0*  ALBUMIN 3.3*   No results for input(s): LIPASE, AMYLASE in the last 168 hours. No results for input(s): AMMONIA in the last 168 hours. CBC: Recent Labs  Lab 02/28/18 2327 03/01/18 1852 03/02/18 0405 03/03/18 0456  WBC 14.5* 9.7 10.6* 7.5  NEUTROABS  --  7.4  --   --   HGB 9.6* 8.8* 8.5* 7.9*  HCT 30.5* 27.5* 26.4* 24.8*  MCV 93.8 93.2 93.6 94.3  PLT 202 166 155 138*   Cardiac Enzymes:   No results for input(s): CKTOTAL, CKMB, CKMBINDEX, TROPONINI in the last 168 hours. BNP (last 3 results) No results for input(s): BNP in the last 8760 hours.  ProBNP (last 3 results) No results for input(s): PROBNP in the last 8760 hours.  CBG: Recent Labs  Lab 03/02/18 0756 03/02/18 1206 03/02/18 1648 03/02/18 2100 03/03/18 0753  GLUCAP 111* 123* 195* 216* 117*    No results found for this or any previous visit (from the past 240 hour(s)).   Studies: No results found.  Scheduled Meds: . amitriptyline  12.5 mg Oral QHS  . aspirin EC  81 mg Oral Daily  . calcitRIOL  0.25 mcg Oral Daily  . carvedilol  6.25 mg Oral BID WC  . cephALEXin  250 mg Oral Q12H  . cholecalciferol  2,000 Units Oral Daily  . famotidine  40 mg Oral  QHS  . hydrALAZINE  50 mg Oral TID  . insulin aspart  0-5 Units Subcutaneous QHS  . insulin aspart  0-9 Units Subcutaneous TID WC  . insulin glargine  15 Units Subcutaneous QHS  . isosorbide mononitrate  60 mg Oral Daily  . magnesium oxide  400 mg Oral Daily  . montelukast  10 mg Oral QHS  . simvastatin  40 mg Oral QHS  . tamsulosin  0.4 mg Oral Daily  . torsemide  20 mg Oral Daily    Continuous Infusions:    Time spent: 35 mins, case discussed with nephrology and urology I have personally reviewed and interpreted on  03/03/2018 daily labs, tele strips, imagings as discussed above under date review session and assessment and plans.  I reviewed all  nursing notes, pharmacy notes, consultant notes,  vitals, pertinent old records  I have discussed plan of care as described above with RN , patient  on 03/03/2018   Florencia Reasons MD, PhD  [facility] Pager [phone]. If 7PM-7AM, please contact night-coverage at [email or web address], password [facility] 03/03/2018, 8:16 AM  LOS: 2 days

## 2018-03-03 NOTE — Progress Notes (Signed)
Once CBI plug obtained, CBI fluid line d/c'd and plugged per urology MD order.

## 2018-03-03 NOTE — Progress Notes (Signed)
LE venous duplex prelim: negative for DVT. Shameria Trimarco Eunice, RDMS, RVT  

## 2018-03-03 NOTE — Consult Note (Addendum)
Reason for Consult: CKD5/ESRD Referring Physician: Dr. Florencia Reasons   Chief Complaint: Hematuria  Assessment/Plan: 1. 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 Hematuria has resolved with the 2 way foley + irrigation; currently no further fluids are being used to irrigate  the bladder since 830am today. - Renal function looks very poor with estimated GFR less than 10 and rising BUN with not great UOP. He may have progressed to ESRD. - He has lower ext edema but lungs are clear, good sats and no c/o orthopnea; therefore, will give a liter of NS gently and then heplock. Bleeding may be contributing to the rise in BUN as well; clinically he is not symptomatic and actually has a good appetite.. - Strict I&O's + floor scale weights only daily - Will hold the Demadex temporarily as well (he's usually on '20mg'$  daily at home) as that may be also increasing the BUN/Cr ratio; he has received 3 doses so far during this hospitalization.  - He doesn't have any absolute indications to initiate dialysis but if his UOP doesn't pick up + stabilized renal function by tomorrow he will need a tunneled catheter to initiate dialysis. Hopefully the renal function was getting worse bec of the demadex and the I&O's are correct with a post-obstructive diuresis. I am not sure this is the case bec there is only about 232m in the foley now that there is no more irrigation.  2. Anemia - check iron panel and will need to initiate ESA +/- IV iron 3. Renal osteodystrophy - will check a phos to determine if he needs a binder at this time. 4. Hematuria - seen by urology; see above 5. HTN - continue home regimen for now. 6. DM 7. CASHD - stable    HPI: Kent ELMOREis an 67y.o. male HTN HLD DM COPD on home O2 [place] by Dr FArta Bruce2 weeks ago. He's had a history of intermittent hematuria for the  past 3 mths and has had to have foley's placed before. He also had an appointment to see an urologist but had yet to see one. He's complaining of difficulty voiding, suprapubic pain, fullness and hematuria. [place]. He denies any dyspnea, CP, nausea, vomiting or anorexia or dysgeusia. He states that the lower ext swelling is worse bec they have not given him his demadex. Also denies any NSAID use, fever, chills.  ROS Pertinent items are noted in HPI.  Chemistry and CBC: Creatinine, Ser  Date/Time Value Ref Range Status  03/03/2018 04:56 AM 8.40 (H) 0.61 - 1.24 mg/dL Final  03/02/2018 04:05 AM 7.65 (H) 0.61 - 1.24 mg/dL Final  03/01/2018 06:52 PM 8.12 (H) 0.61 - 1.24 mg/dL Final  02/28/2018 11:27 PM 7.13 (H) 0.61 - 1.24 mg/dL Final  01/08/2018 08:43 AM 6.42 (HH) 0.40 - 1.50 mg/dL Final  12/24/2017 09:32 AM 5.91 (HH) 0.40 - 1.50 mg/dL Final  11/18/2017 05:54 PM 5.49 (H) 0.61 - 1.24 mg/dL Final  11/03/2016 05:43 AM 3.63 (H) 0.61 - 1.24 mg/dL Final  11/02/2016 04:21 AM 4.23 (H) 0.61 - 1.24 mg/dL Final  11/01/2016 10:32 AM 4.28 (H) 0.61 - 1.24 mg/dL Final  10/30/2016 12:02 PM 4.44 (H) 0.61 - 1.24 mg/dL Final  11/02/2015 11:12 AM 2.85 (H) 0.40 - 1.50 mg/dL Final  04/15/2014 12:01  PM 2.5 (H) 0.4 - 1.5 mg/dL Final  09/16/2013 02:13 PM 2.2 (H) 0.4 - 1.5 mg/dL Final  06/17/2013 09:22 AM 1.8 (H) 0.4 - 1.5 mg/dL Final  02/29/2012 10:24 AM 2.6 (H) 0.4 - 1.5 mg/dL Final  05/29/2011 08:25 AM 1.8 (H) 0.4 - 1.5 mg/dL Final  02/21/2011 01:18 PM 1.8 (H) 0.4 - 1.5 mg/dL Final  07/27/2010 08:56 AM 1.6 (H) 0.4 - 1.5 mg/dL Final  05/05/2010 12:00 AM 1.7 (H) 0.4 - 1.5 mg/dL Final  02/22/2010 10:04 AM 2.4 (H) 0.4 - 1.5 mg/dL Final  01/06/2010 09:17 AM 1.5 0.4 - 1.5 mg/dL Final  12/23/2009 10:03 AM 1.5 0.4 - 1.5 mg/dL Final  09/06/2009 09:09 AM 0.6 0.4 - 1.5 mg/dL Final  06/21/2009 03:06 PM 1.4 0.4 - 1.5 mg/dL Final  06/01/2009 03:42 PM 1.6 (H)  0.4 - 1.5 mg/dL Final  05/26/2009 10:33 AM 1.5 0.4 - 1.5 mg/dL Final  04/12/2009 09:48 AM 1.6 (H) 0.4 - 1.5 mg/dL Final  03/31/2009 08:00 AM 1.8 (H) 0.4 - 1.5 mg/dL Final  03/03/2009 11:11 AM 1.6 (H) 0.4 - 1.5 mg/dL Final  07/28/2008 09:35 AM 1.5 0.4 - 1.5 mg/dL Final  03/31/2008 09:47 AM 1.2 0.4 - 1.5 mg/dL Final  01/06/2008 04:26 PM 1.3 0.4 - 1.5 mg/dL Final  06/13/2007 09:20 AM 1.1 0.4 - 1.5 mg/dL Final  05/14/2007 09:22 AM 1.3 0.4 - 1.5 mg/dL Final  05/07/2007 10:09 AM 1.3 0.4 - 1.5 mg/dL Final  02/06/2007 08:56 AM 1.2 0.4 - 1.5 mg/dL Final   Recent Labs  Lab 02/28/18 2327 03/01/18 1852 03/02/18 0405 03/03/18 0456  NA 138 136 136 138  K 4.9 4.9 4.6 4.6  CL 99 100 100 101  CO2 '23 22 22 24  '$ GLUCOSE 122* 137* 112* 151*  BUN 115* 124* 127* 137*  CREATININE 7.13* 8.12* 7.65* 8.40*  CALCIUM 8.9 8.5* 8.4* 8.2*   Recent Labs  Lab 02/28/18 2327 03/01/18 1852 03/02/18 0405 03/03/18 0456  WBC 14.5* 9.7 10.6* 7.5  NEUTROABS  --  7.4  --   --   HGB 9.6* 8.8* 8.5* 7.9*  HCT 30.5* 27.5* 26.4* 24.8*  MCV 93.8 93.2 93.6 94.3  PLT 202 166 155 138*   Liver Function Tests: Recent Labs  Lab 03/03/18 0456  AST 17  ALT 10  ALKPHOS 49  BILITOT 0.9  PROT 6.0*  ALBUMIN 3.3*   No results for input(s): LIPASE, AMYLASE in the last 168 hours. No results for input(s): AMMONIA in the last 168 hours. Cardiac Enzymes: No results for input(s): CKTOTAL, CKMB, CKMBINDEX, TROPONINI in the last 168 hours. Iron Studies: No results for input(s): IRON, TIBC, TRANSFERRIN, FERRITIN in the last 72 hours. PT/INR: '@LABRCNTIP'$ (inr:5)  Xrays/Other Studies: ) Results for orders placed or performed during the hospital encounter of 03/01/18 (from the past 48 hour(s))  CBC with Differential     Status: Abnormal   Collection Time: 03/01/18  6:52 PM  Result Value Ref Range   WBC 9.7 4.0 - 10.5 K/uL   RBC 2.95 (L) 4.22 - 5.81 MIL/uL   Hemoglobin 8.8 (L) 13.0 - 17.0 g/dL   HCT 27.5 (L) 39.0 - 52.0 %    MCV 93.2 78.0 - 100.0 fL   MCH 29.8 26.0 - 34.0 pg   MCHC 32.0 30.0 - 36.0 g/dL   RDW 12.6 11.5 - 15.5 %   Platelets 166 150 - 400 K/uL   Neutrophils Relative % 77 %   Neutro Abs 7.4 1.7 - 7.7 K/uL  Lymphocytes Relative 17 %   Lymphs Abs 1.7 0.7 - 4.0 K/uL   Monocytes Relative 6 %   Monocytes Absolute 0.6 0.1 - 1.0 K/uL   Eosinophils Relative 0 %   Eosinophils Absolute 0.0 0.0 - 0.7 K/uL   Basophils Relative 0 %   Basophils Absolute 0.0 0.0 - 0.1 K/uL   Immature Granulocytes 0 %   Abs Immature Granulocytes 0.0 0.0 - 0.1 K/uL    Comment: Performed at [facility] [address], [place], [place] [address]  Basic metabolic panel     Status: Abnormal   Collection Time: 03/01/18  6:52 PM  Result Value Ref Range   Sodium 136 135 - 145 mmol/L   Potassium 4.9 3.5 - 5.1 mmol/L   Chloride 100 98 - 111 mmol/L    Comment: Please note change in reference range.   CO2 22 22 - 32 mmol/L   Glucose, Bld 137 (H) 70 - 99 mg/dL    Comment: Please note change in reference range.   BUN 124 (H) 8 - 23 mg/dL    Comment: Please note change in reference range.   Creatinine, Ser 8.12 (H) 0.61 - 1.24 mg/dL   Calcium 8.5 (L) 8.9 - 10.3 mg/dL   GFR calc non Af Amer 6 (L) >60 mL/min   GFR calc Af Amer 7 (L) >60 mL/min    Comment: (NOTE) The eGFR has been calculated using the CKD EPI equation. This calculation has not been validated in all clinical situations. eGFR's persistently <60 mL/min signify possible Chronic Kidney Disease.    Anion gap 14 5 - 15    Comment: Performed at [facility] [address], [place], [place] [address]  Glucose, capillary     Status: None   Collection Time: 03/02/18 12:21 AM  Result Value Ref Range   Glucose-Capillary 90 70 - 99 mg/dL  HIV antibody (Routine Testing)     Status: None   Collection Time: 03/02/18  4:05 AM  Result Value Ref Range   HIV Screen 4th Generation wRfx Non Reactive Non Reactive    Comment: (NOTE) Performed At: [facility] [place], [place] [id number] Rush Farmer MD EX:[id number]   Basic metabolic panel     Status: Abnormal   Collection Time: 03/02/18  4:05 AM  Result Value Ref Range   Sodium 136 135 - 145 mmol/L   Potassium 4.6 3.5 - 5.1 mmol/L   Chloride 100 98 - 111 mmol/L    Comment: Please note change in reference range.   CO2 22 22 - 32 mmol/L   Glucose, Bld 112 (H) 70 - 99 mg/dL    Comment: Please note change in reference range.   BUN 127 (H) 8 - 23 mg/dL    Comment: Please note change in reference range.   Creatinine, Ser 7.65 (H) 0.61 - 1.24 mg/dL   Calcium 8.4 (L) 8.9 - 10.3 mg/dL   GFR calc non Af Amer 7 (L) >60 mL/min   GFR calc Af Amer 8 (L) >60 mL/min    Comment: (NOTE) The eGFR has been calculated using the CKD EPI equation. This calculation has not been validated in all clinical situations. eGFR's persistently <60 mL/min signify possible Chronic Kidney Disease.    Anion gap 14 5 - 15    Comment: Performed at [facility] [address] [place], [place] [address]  CBC     Status: Abnormal   Collection Time: 03/02/18  4:05  AM  Result Value Ref Range   WBC 10.6 (H) 4.0 - 10.5 K/uL   RBC 2.82 (L) 4.22 - 5.81 MIL/uL   Hemoglobin 8.5 (L) 13.0 - 17.0 g/dL   HCT 26.4 (L) 39.0 - 52.0 %   MCV 93.6 78.0 - 100.0 fL   MCH 30.1 26.0 - 34.0 pg   MCHC 32.2 30.0 - 36.0 g/dL   RDW 12.8 11.5 - 15.5 %   Platelets 155 150 - 400 K/uL    Comment: Performed at [facility] [address], [place], [place] [address]  Glucose, capillary     Status: Abnormal   Collection Time: 03/02/18  7:56 AM  Result Value Ref Range   Glucose-Capillary 111 (H) 70 - 99 mg/dL  Glucose, capillary     Status: Abnormal   Collection Time: 03/02/18 12:06 PM  Result Value Ref Range   Glucose-Capillary 123 (H) 70 - 99 mg/dL  Glucose, capillary     Status: Abnormal   Collection Time: 03/02/18  4:48 PM  Result Value Ref Range   Glucose-Capillary 195 (H) 70 - 99 mg/dL  Glucose,  capillary     Status: Abnormal   Collection Time: 03/02/18  9:00 PM  Result Value Ref Range   Glucose-Capillary 216 (H) 70 - 99 mg/dL  CBC     Status: Abnormal   Collection Time: 03/03/18  4:56 AM  Result Value Ref Range   WBC 7.5 4.0 - 10.5 K/uL   RBC 2.63 (L) 4.22 - 5.81 MIL/uL   Hemoglobin 7.9 (L) 13.0 - 17.0 g/dL   HCT 24.8 (L) 39.0 - 52.0 %   MCV 94.3 78.0 - 100.0 fL   MCH 30.0 26.0 - 34.0 pg   MCHC 31.9 30.0 - 36.0 g/dL   RDW 12.7 11.5 - 15.5 %   Platelets 138 (L) 150 - 400 K/uL    Comment: Performed at [facility], [place] [address] [place], [place] [address]  Basic metabolic panel     Status: Abnormal   Collection Time: 03/03/18  4:56 AM  Result Value Ref Range   Sodium 138 135 - 145 mmol/L   Potassium 4.6 3.5 - 5.1 mmol/L   Chloride 101 98 - 111 mmol/L    Comment: Please note change in reference range.   CO2 24 22 - 32 mmol/L   Glucose, Bld 151 (H) 70 - 99 mg/dL    Comment: Please note change in reference range.   BUN 137 (H) 8 - 23 mg/dL    Comment: Please note change in reference range.   Creatinine, Ser 8.40 (H) 0.61 - 1.24 mg/dL   Calcium 8.2 (L) 8.9 - 10.3 mg/dL   GFR calc non Af Amer 6 (L) >60 mL/min   GFR calc Af Amer 7 (L) >60 mL/min    Comment: (NOTE) The eGFR has been calculated using the CKD EPI equation. This calculation has not been validated in all clinical situations. eGFR's persistently <60 mL/min signify possible Chronic Kidney Disease.    Anion gap 13 5 - 15    Comment: Performed at [facility] [address], [place], [place] [address]  Protime-INR     Status: Abnormal   Collection Time: 03/03/18  4:56 AM  Result Value Ref Range   Prothrombin Time 16.7 (H) 11.4 - 15.2 seconds   INR 1.36     Comment: Performed at [facility] [address], [place], [place] [address]  Hepatic function panel  Status: Abnormal   Collection Time: 03/03/18  4:56 AM  Result Value Ref Range   Total Protein 6.0 (L) 6.5 - 8.1 g/dL   Albumin  3.3 (L) 3.5 - 5.0 g/dL   AST 17 15 - 41 U/L   ALT 10 0 - 44 U/L    Comment: Please note change in reference range.   Alkaline Phosphatase 49 38 - 126 U/L   Total Bilirubin 0.9 0.3 - 1.2 mg/dL   Bilirubin, Direct 0.2 0.0 - 0.2 mg/dL    Comment: Please note change in reference range.   Indirect Bilirubin 0.7 0.3 - 0.9 mg/dL    Comment: Performed at [facility], [place] [address] [place], [place] [address]  Glucose, capillary     Status: Abnormal   Collection Time: 03/03/18  7:53 AM  Result Value Ref Range   Glucose-Capillary 117 (H) 70 - 99 mg/dL  Glucose, capillary     Status: Abnormal   Collection Time: 03/03/18 11:55 AM  Result Value Ref Range   Glucose-Capillary 178 (H) 70 - 99 mg/dL   No results found.  PMH:   Past Medical History:  Diagnosis Date  . Acute renal failure superimposed on chronic kidney disease ([place])    [name] 11/01/2016  . Anemia   . Arthritis    "back, knees" (11/01/2016)  . Asthma   . CAD (coronary artery disease) 12/31/07   [facility] stents- placed in the circumflex and LAD  . Chronic bronchitis ([place])   . History of blood transfusion 10/30/2016   "related to anemia"  . History of gout   . History of kidney stones   . Hyperlipidemia   . Hypertension   . NSTEMI (non-ST elevated myocardial infarction) ([place]) 10/30/2016  . OA (osteoarthritis)   . Obese   . On home oxygen therapy    "2L w/CPAP" (11/01/2016)  . OSA on CPAP   . Psoriatic arthritis ([place])   . Type II diabetes mellitus ([facility])     PSH:   Past Surgical History:  Procedure Laterality Date  . AV FISTULA PLACEMENT    . CORONARY ANGIOPLASTY WITH STENT PLACEMENT  2009   "3 stents"    Allergies: No Known Allergies  Medications:   Prior to Admission medications   Medication Sig Start Date End Date Taking? Authorizing Provider  amitriptyline (ELAVIL) 25 MG tablet Take 12.5 mg by mouth at bedtime.   Yes [provider]  aspirin EC 81 MG tablet Take 81 mg by mouth daily.    Yes [provider]  calcitRIOL (ROCALTROL) 0.25 MCG capsule Take 0.25 mcg by mouth daily.   Yes [provider]  carvedilol (COREG) 6.25 MG tablet TAKE 1 TABLET BY MOUTH TWICE DAILY WITH  MEALS 03/01/18  Yes Martinique, Betty G, MD  cephALEXin (KEFLEX) 500 MG capsule Take 1 capsule (500 mg total) by mouth daily. 03/01/18  Yes Ripley Fraise, MD  Cholecalciferol (VITAMIN D) 2000 units tablet Take 2,000 Units by mouth daily.   Yes [provider]  glipiZIDE (GLUCOTROL) 10 MG tablet TAKE 1 TABLET BY MOUTH ONCE DAILY BEFORE BREAKFAST 11/09/17  Yes Martinique, Betty G, MD  hydrALAZINE (APRESOLINE) 50 MG tablet Take 1 tablet (50 mg total) by mouth 3 (three) times daily. 03/09/17  Yes Martinique, Betty G, MD  isosorbide mononitrate (IMDUR) 60 MG 24 hr tablet TAKE 1 TABLET BY MOUTH ONCE DAILY 12/14/17  Yes Martinique, Betty G, MD  magnesium oxide (MAG-OX) 400 MG tablet Take 400 mg by mouth  daily.   Yes [provider]  metolazone (ZAROXOLYN) 2.5 MG tablet Take 2.5 mg by mouth as needed (fluid).    Yes [provider]  montelukast (SINGULAIR) 10 MG tablet Take 10 mg by mouth at bedtime.    Yes [provider]  nitroGLYCERIN (NITROSTAT) 0.4 MG SL tablet Place 1 tablet (0.4 mg total) under the tongue every 5 (five) minutes as needed for chest pain. 11/03/16  Yes Rai, Ripudeep K, MD  NOVOLOG FLEXPEN 100 UNIT/ML FlexPen  INJECT 25 UNITS INTO THE SKIN THREE TIMES DAILY USING A SLIDING SCALE 11/09/17  Yes Martinique, Betty G, MD  Oxycodone HCl 10 MG TABS Take 10 mg by mouth 4 (four) times daily as needed (for pain).    Yes Arcedo, Perico, DO  prasugrel (EFFIENT) 10 MG TABS Take 10 mg by mouth daily.     Yes [provider]  ranitidine (ZANTAC) 300 MG tablet TAKE 1 TABLET BY MOUTH AT BEDTIME 01/28/18  Yes Martinique, Betty G, MD  simvastatin (ZOCOR) 40 MG tablet Take 1 tablet (40 mg total) by mouth at bedtime. 03/09/17  Yes Martin[place], Betty G, MD  torsemide (DEMADEX) 20 MG tablet TAKE  ONE TABLET BY MOUTH TWICE DAILY Patient taking differently: TAKE ONE TABLET BY MOUTH DAILY 08/10/14  Yes Dorena Cookey, MD  TRESIBA FLEXTOUCH 100 UNIT/ML SOPN FlexTouch Pen INJECT 50 UNITS SUBCUTANEOUSLY ONCE DAILY AT  10  PM 02/18/18  Yes Martinique, Betty G, MD  albuterol (PROVENTIL HFA;VENTOLIN HFA) 108 (90 Base) MCG/ACT inhaler Inhale 2 puffs into the lungs every 6 (six) hours as needed for wheezing or shortness of breath. Patient not taking: Reported on 03/01/2018 10/30/16   Martinique, Betty G, MD  allopurinol (ZYLOPRIM) 300 MG tablet TAKE 1/2 (ONE-HALF) TABLET BY MOUTH ONCE DAILY Patient not taking: Reported on 03/01/2018 06/22/17   Martinique, Betty G, MD  glucose blood (ONE TOUCH ULTRA TEST) test strip USE ONE STRIP TO CHECK GLUCOSE 4 TIMES DAILY 11/22/17   Martinique, Betty G, MD  guaiFENesin (MUCINEX) 600 MG 12 hr tablet Take 1 tablet (600 mg total) by mouth every 12 (twelve) hours as needed for to loosen phlegm. Patient not taking: Reported on 03/01/2018 11/03/16   Mendel Corning, MD    Discontinued Meds:   Medications Discontinued During This Encounter  Medication Reason  . cefTRIAXone (ROCEPHIN) 2 g in sodium chloride 0.9 % 100 mL IVPB     Social History:  reports that he has never smoked. He has never used smokeless tobacco. He reports that he drank alcohol. He reports that he does not use drugs.  Family History:   Family History  Problem Relation Age of Onset  . Diabetes Other   . Hyperlipidemia Other   . Hypertension Other     Blood pressure (!) 127/40, pulse 61, temperature 97.9 F (36.6 C), resp. rate 18, height '5\' 7"'$  (1.702 m), weight 133.8 kg (295 lb), SpO2 100 %. General appearance: alert, cooperative and appears stated age Head: Normocephalic, without obvious abnormality, atraumatic Eyes: negative Neck: no adenopathy, no carotid bruit, no JVD, supple, symmetrical, trachea midline and thyroid not enlarged, symmetric, no tenderness/mass/nodules Back: symmetric, no curvature. ROM  normal. No CVA tenderness. Resp: clear to auscultation bilaterally Chest wall: no tenderness Cardio: regular rate and rhythm, S1, S2 normal, no murmur, click, rub or gallop GI: soft, non-tender; bowel sounds normal; no masses,  no organomegaly Extremities: edema trace Pulses: 2+ and symmetric Skin: Skin color, texture, turgor normal. No rashes or lesions  Lymph nodes: Cervical, supraclavicular, and axillary nodes normal. Neurologic: Grossly normal       Celester Morgan, Hunt Oris, MD 03/03/2018, 12:32 PM

## 2018-03-03 NOTE — Progress Notes (Signed)
Subjective/Chief Complaint:  1 - Gross Hematuria - on / off gross hematuria 2019. Non-Con CT 2019 with left lower pole mass. Cysto scheduled and pending.   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 NO contrast scans to verify if enhancing. Some fullness in left renal pelvis suggesting some blood. 1 artery / 1 vein (lumbar below artery noted) left renovascular anatomy.   3 - Urinary Retention - New urinary retention from some small clots and catheter placed in ER in [place] Clotted therefore transferred to [facility] where irrigated as per above.   4 - Prostate Screening - PSA 0.62 2019 ==> STOP PSA based screening given comorbidity.  5 - Stage 4 Renal Insufficiency - Cr 8s from medial renal disease. Has AVF in place preparing for IHD soon. CT w/o hydro x several.   Today "Nichola" is stable. Gross hematuria completely resolved with gentle bladder irrigation overnight.    Objective: Vital signs in last 24 hours: Temp:  [97.5 F (36.4 C)-98.6 F (37 C)] 97.9 F (36.6 C) (07/14 0509) Pulse Rate:  [61-69] 61 (07/14 0509) Resp:  [16-18] 18 (07/14 0509) BP: (127-139)/(40-43) 127/40 (07/14 0509) SpO2:  [96 %-100 %] 100 % (07/14 0509) Last BM Date: 03/02/18  Intake/Output from previous day: 07/13 0701 - 07/14 0700 In: 6460 [P.O.:460] Out: 13875 [Urine:13875] Intake/Output this shift: No intake/output data recorded.  General appearance: alert, cooperative and appears stated age Eyes: negative Nose: Nares normal. Septum midline. Mucosa normal. No drainage or sinus tenderness. Throat: lips, mucosa, and tongue normal; teeth and gums normal Neck: supple, symmetrical, trachea midline Back: symmetric, no curvature. ROM normal. No CVA tenderness. Resp: non-labored on minimal [place] O2 Cardio: Nl rate.  Male genitalia: normal, 3 way foley completely clear off irrigation.  Extremities: extremities normal, atraumatic, no cyanosis or edema Skin: Skin color, texture,  turgor normal. No rashes or lesions Lymph nodes: Cervical, supraclavicular, and axillary nodes normal. Neurologic: Grossly normal  Lab Results:  Recent Labs    03/02/18 0405 03/03/18 0456  WBC 10.6* 7.5  HGB 8.5* 7.9*  HCT 26.4* 24.8*  PLT 155 138*   BMET Recent Labs    03/02/18 0405 03/03/18 0456  NA 136 138  K 4.6 4.6  CL 100 101  CO2 22 24  GLUCOSE 112* 151*  BUN 127* 137*  CREATININE 7.65* 8.40*  CALCIUM 8.4* 8.2*   PT/INR Recent Labs    03/03/18 0456  LABPROT 16.7*  INR 1.36   ABG No results for input(s): PHART, HCO3 in the last 72 hours.  Invalid input(s): PCO2, PO2  Studies/Results: No results found.  Anti-infectives: Anti-infectives (From admission, onward)   Start     Dose/Rate Route Frequency Ordered Stop   03/02/18 2200  cephALEXin (KEFLEX) capsule 250 mg     250 mg Oral Every 12 hours 03/02/18 1709     03/02/18 0000  cefTRIAXone (ROCEPHIN) 2 g in sodium chloride 0.9 % 100 mL IVPB  Status:  Discontinued     2 g 200 mL/hr over 30 Minutes Intravenous Every 24 hours 03/01/18 2333 03/02/18 1709      Assessment/Plan:  1 - Gross Hematuria - likely from left renal mass, but needs cysto to r/o lower tract etiology / bleeding.   2 - Left Renal Neoplasm - although not large, this does appear to be likely source of his hematuria. Likely best served by left radial nephrectomy, especially as already teetering on need for dialysis.  3 - Urinary Retention - this appears to be from clots that are now resolved.    4 - Prostate Screening - up to date this year, no indication for further screening. 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 No sig hydro on imaging x several.   Will re-assess in afternoon. If hematuria remains resolved, then liekly DC home with cysto / trial of void in office tomorrow as planned.   [facility], Jerrit Horen 03/03/2018

## 2018-03-04 LAB — CBC
HCT: 24.3 % — ABNORMAL LOW (ref 39.0–52.0)
Hemoglobin: 7.8 g/dL — ABNORMAL LOW (ref 13.0–17.0)
MCH: 30.4 pg (ref 26.0–34.0)
MCHC: 32.1 g/dL (ref 30.0–36.0)
MCV: 94.6 fL (ref 78.0–100.0)
PLATELETS: 136 10*3/uL — AB (ref 150–400)
RBC: 2.57 MIL/uL — ABNORMAL LOW (ref 4.22–5.81)
RDW: 12.4 % (ref 11.5–15.5)
WBC: 7.6 10*3/uL (ref 4.0–10.5)

## 2018-03-04 LAB — IRON AND TIBC
Iron: 36 ug/dL — ABNORMAL LOW (ref 45–182)
SATURATION RATIOS: 13 % — AB (ref 17.9–39.5)
TIBC: 273 ug/dL (ref 250–450)
UIBC: 237 ug/dL

## 2018-03-04 LAB — BASIC METABOLIC PANEL
Anion gap: 14 (ref 5–15)
BUN: 148 mg/dL — AB (ref 8–23)
CALCIUM: 7.9 mg/dL — AB (ref 8.9–10.3)
CO2: 22 mmol/L (ref 22–32)
Chloride: 102 mmol/L (ref 98–111)
Creatinine, Ser: 8.5 mg/dL — ABNORMAL HIGH (ref 0.61–1.24)
GFR, EST AFRICAN AMERICAN: 7 mL/min — AB (ref >60)
GFR, EST NON AFRICAN AMERICAN: 6 mL/min — AB (ref >60)
Glucose, Bld: 142 mg/dL — ABNORMAL HIGH (ref 70–99)
Potassium: 4.6 mmol/L (ref 3.5–5.1)
SODIUM: 138 mmol/L (ref 135–145)

## 2018-03-04 LAB — GLUCOSE, CAPILLARY
GLUCOSE-CAPILLARY: 137 mg/dL — AB (ref 70–99)
GLUCOSE-CAPILLARY: 153 mg/dL — AB (ref 70–99)

## 2018-03-04 LAB — FERRITIN: FERRITIN: 47 ng/mL (ref 24–336)

## 2018-03-04 MED ORDER — CALCIUM ACETATE (PHOS BINDER) 667 MG PO CAPS
667.0000 mg | ORAL_CAPSULE | Freq: Three times a day (TID) | ORAL | Status: DC
  Administered 2018-03-04: 667 mg via ORAL
  Filled 2018-03-04: qty 1

## 2018-03-04 MED ORDER — TAMSULOSIN HCL 0.4 MG PO CAPS: 0.4000 mg | ORAL_CAPSULE | Freq: Every day | ORAL | 0 refills | Status: AC

## 2018-03-04 MED ORDER — SODIUM CHLORIDE 0.9 % IV SOLN: INTRAVENOUS | Status: DC | PRN

## 2018-03-04 MED ORDER — TORSEMIDE 20 MG PO TABS: 20.0000 mg | ORAL_TABLET | Freq: Every day | ORAL | 0 refills | Status: DC

## 2018-03-04 MED ORDER — FERUMOXYTOL INJECTION 510 MG/17 ML
510.0000 mg | Freq: Once | INTRAVENOUS | Status: AC
  Administered 2018-03-04: 510 mg via INTRAVENOUS
  Filled 2018-03-04: qty 17

## 2018-03-04 MED ORDER — CALCIUM ACETATE (PHOS BINDER) 667 MG PO CAPS: 667.0000 mg | ORAL_CAPSULE | Freq: Three times a day (TID) | ORAL | 0 refills | Status: AC

## 2018-03-04 NOTE — Care Management Important Message (Signed)
Important Message  Patient Details  Name: Kent Osborne MRN: [id number] Date of Birth: 27-Jun-1951   Medicare Important Message Given:  Yes    [name] 03/04/2018, 4:05 PM

## 2018-03-04 NOTE — Consult Note (Signed)
            [facility] CM Primary Care Navigator  03/04/2018  RONN SMOLINSKY [birth year] [id number]   Went to see patient at the bedside to identify possible discharge needs buthe wasalreadydischarged home per staff.  Per chart review, patient was admitted for hematuria and urinary retention.  Primary care provider's office is listed as providing transition of care (TOC) follow-up.  Patient has discharge instruction to follow-up withprimary care provider in 1 week; nephrology and urology follow-up in 1 week.    For additional questions please contact:  Edwena Felty A. Sharri Loya, BSN, RN-BC [facility] PRIMARY CARE Navigator Cell: [phone]

## 2018-03-04 NOTE — Progress Notes (Signed)
Pt taken to exit in wheelchair and D/C'd home in private auto.

## 2018-03-04 NOTE — Discharge Summary (Signed)
Discharge Summary  Kent Osborne WCH:[id number] DOB: March 05, 1951  PCP: Martinique, Betty G, MD  Admit date: 03/01/2018 Discharge date: 03/04/2018  Time spent: 54mins, more than 50% time spent on coordination of care  Recommendations for Outpatient Follow-up:  1. F/u with PMD within a week  for hospital discharge follow up, repeat cbc/bmp at follow up. 2. F/u with nephrology in one week to decide on timing of dialysis 3. F/u with urology in one week  Discharge Diagnoses:  Active Hospital Problems   Diagnosis Date Noted  . Urinary retention 03/01/2018  . Type II diabetes mellitus with renal manifestations ([place]) 03/01/2018  . GERD (gastroesophageal reflux disease) 03/01/2018  . Depression 03/01/2018  . Normocytic anemia 03/01/2018  . Acute renal failure superimposed on stage 4 chronic kidney disease ([place]) 11/01/2016  . COPD (chronic obstructive pulmonary disease) ([place]) 11/01/2016  . OSA (obstructive sleep apnea) 11/01/2016  . Coronary atherosclerosis 01/06/2008  . Hyperlipemia 01/06/2008  . Essential hypertension 02/21/2007    Resolved Hospital Problems  No resolved problems to display.    Discharge Condition: stable  Diet recommendation: heart healthy/carb modified/renal diet  Filed Weights   03/01/18 1749  Weight: 133.8 kg (295 lb)    History of present illness: (per admitting MD Dr Blaine Hamper) Referring MD/NP/PA:   PCP: Martinique, Betty G, MD   Patient coming from:  The patient is coming from home.  At baseline, pt is independent for most of ADL.   Chief Complaint: Hematuria, urinary retention  HPI: Kent Osborne is a 67 y.o. 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  Patient states that he has been  having hematuria for almost 3 months with unclear etiology.  He has scheduled to see urologist, but has not been seen yet. He states that he developed difficulty urinating yesterday, and had mild suprapubic pain and pressure feeling. He was seen at [facility] ED last night. He was discharged home after Foley catheter was replaced. Pt was placed on Keflex. Since this AM, he has recurrent difficulty urinating and decreased urine output.  He also has dysuria and burning on urination.  No fever or chills.  Patient states that he is lower abdominal pain has resolved.  Currently no nausea, vomiting, diarrhea or abdominal pain.  Denies chest pain, shortness of breath, cough.  No unilateral weakness.  ED Course: pt was found to have WBC 9.7, worsening renal function with creatinine up from recent baseline of 5-7 to 8/12 and BUN 124, WBC 9.7, pending urinalysis, temperature normal, no tachycardia, no tachypnea, oxygen saturation 95% on 2 L nasal cannula oxygen.  Patient is admitted to telemetry bed as inpatient.  Urology was consulted by EDP (EDP consulted to urology, but EDP did not write down the name, possibl Dr. Tresa Moore?).     Hospital Course:  Principal Problem:   Urinary retention Active Problems:   Hyperlipemia   Essential hypertension   Coronary atherosclerosis   Acute renal failure superimposed on stage 4 chronic kidney disease (HCC)   COPD (chronic obstructive pulmonary disease) (HCC)   OSA (obstructive sleep apnea)   Type II diabetes mellitus with renal manifestations (HCC)   GERD (gastroesophageal reflux disease)   Depression   Normocytic anemia  Gross hematuria/urinary retention -Per patient, he has had hematuria for the last three  months -he presented to the [facility] ED on 7/11 due to the same, he was sent home after foley insertion and he is given oral abx. However, he returned to the [facility]  on 7/12 due to bladder spasm, decreased foley output, he was sent home after foley  irrigation, but returned the same day on 7/12 due to foley not draining and bladder spasm, he is started on CBI and admitted to the hospital. -Continue flomax, urine culture no growth, he does not appear septic, however wbc was elevated at 14.5 on 7/11, wbc at 10.6 on presentation. he is started on iv rocephin on presentation, abx changed to keflex. -CBI stopped this am, urology consulted, he is cleared to discharge home from urology , he need to follow up with urology in one week. -hold off effient, urology is ok with asa -case discussed with urology Dr Tresa Moore, patient can keep foley in and follow up with urology in the clinic, no plan for cystoscope during this hospitalization.  AKI on CKDV -He is nephrology is from [facility], he just had AVF placed 2 weeks ago -He does has bilateral lower extremity pitting edema report is chronic, lungs are clear, electrolytes are stable, BUN elevated at 127, he denies confusion, report fatigue for the last few weeks -bun/cr trend up , nephrology consulted -Home meds demadex continued on admission, this is stopped on 7/14 per nephrology recommendation, metolazone held since admission, renal dosing meds -he is started on gentle hydration per nephrology recommendation -per nephrology patient likely will need to start on dialysis soon, but patient wants to wait. Metolazone discontinued , demadex hold for another three days per nephrology. He is advised to follow up with his nephrologist Dr Marlowe Sax in one week to discuss timing of dialysis.    Bilateral lower extremity edema R>L  venous doppler negative for DVT  Acute on chronic anemia: He has baseline anemia of chronic disease Acute drop likely due to hematuria hgb around 9-10, hgb on presentation was 8.8, hgb 7.8-7.9 for the last two days, he is asymptomatic He is to follow up with pmd and nephrology closely.    Insulin dependent dm2 A1c 6 in May Blood glucose stable on reduced dose of insulin Home  dose insulin resumed at discharge, suspect diet discrepancy  He is to follow up with pmd closely for further insulin dosing   CAD s/p stent in 2009 Denies chest pain -Effusion on hold due to hematuria -He is continued on aspirin, Coreg, statin  HTN Coreg/hydralazine/imdur  Body mass index is 46.2 kg/m. meet criteria for morbid obesity. OSA on cpap and night time o2  Tele with degree AV block, a few PVCs He has been stable.   Code Status: full  Family Communication: patient   Disposition Plan: home with urology and nephrology clearance   Consultants:  Urology  nephrology  Procedures:  CBI  Antibiotics:  Rocephin x1, then keflex   Discharge Exam: BP (!) 130/46   Pulse 91   Temp 98.4 F (36.9 C) (Oral)   Resp 18   Ht 5\' 7"  (1.702 m)   Wt 133.8 kg (295 lb)   SpO2 100%   BMI 46.20 kg/m     General:  NAD, obsed  Cardiovascular: RRR  Respiratory: CTABL  Abdomen: Soft/ND/NT, positive BS  Musculoskeletal: bilateral lower extremity pitting Edema, right > left  Neuro: alert, oriented     Discharge Instructions You were cared for by a hospitalist during your hospital stay. If you have any questions about  your discharge medications or the care you received while you were in the hospital after you are discharged, you can call the unit and asked to speak with the hospitalist on call if the hospitalist that took care of you is not available. Once you are discharged, your primary care physician will handle any further medical issues. Please note that NO REFILLS for any discharge medications will be authorized once you are discharged, as it is imperative that you return to your primary care physician (or establish a relationship with a primary care physician if you do not have one) for your aftercare needs so that they can reassess your need for medications and monitor your lab values.  Discharge Instructions    Diet general   Complete by:  As  directed    Renal diet   Increase activity slowly   Complete by:  As directed      Allergies as of 03/04/2018   No Known Allergies     Medication List    STOP taking these medications   allopurinol 300 MG tablet Commonly known as:  ZYLOPRIM   EFFIENT 10 MG Tabs tablet Generic drug:  prasugrel   magnesium oxide 400 MG tablet Commonly known as:  MAG-OX   metolazone 2.5 MG tablet Commonly known as:  ZAROXOLYN   ranitidine 300 MG tablet Commonly known as:  ZANTAC     TAKE these medications   albuterol 108 (90 Base) MCG/ACT inhaler Commonly known as:  PROVENTIL HFA;VENTOLIN HFA Inhale 2 puffs into the lungs every 6 (six) hours as needed for wheezing or shortness of breath.   amitriptyline 25 MG tablet Commonly known as:  ELAVIL Take 12.5 mg by mouth at bedtime.   aspirin EC 81 MG tablet Take 81 mg by mouth daily.   calcitRIOL 0.25 MCG capsule Commonly known as:  ROCALTROL Take 0.25 mcg by mouth daily.   calcium acetate 667 MG capsule Commonly known as:  PHOSLO Take 1 capsule (667 mg total) by mouth 3 (three) times daily with meals.   carvedilol 6.25 MG tablet Commonly known as:  COREG TAKE 1 TABLET BY MOUTH TWICE DAILY WITH  MEALS   cephALEXin 500 MG capsule Commonly known as:  KEFLEX Take 1 capsule (500 mg total) by mouth daily.   glipiZIDE 10 MG tablet Commonly known as:  GLUCOTROL TAKE 1 TABLET BY MOUTH ONCE DAILY BEFORE BREAKFAST   glucose blood test strip Commonly known as:  ONE TOUCH ULTRA TEST USE ONE STRIP TO CHECK GLUCOSE 4 TIMES DAILY   guaiFENesin 600 MG 12 hr tablet Commonly known as:  MUCINEX Take 1 tablet (600 mg total) by mouth every 12 (twelve) hours as needed for to loosen phlegm.   hydrALAZINE 50 MG tablet Commonly known as:  APRESOLINE Take 1 tablet (50 mg total) by mouth 3 (three) times daily.   isosorbide mononitrate 60 MG 24 hr tablet Commonly known as:  IMDUR TAKE 1 TABLET BY MOUTH ONCE DAILY   montelukast 10 MG  tablet Commonly known as:  SINGULAIR Take 10 mg by mouth at bedtime.   nitroGLYCERIN 0.4 MG SL tablet Commonly known as:  NITROSTAT Place 1 tablet (0.4 mg total) under the tongue every 5 (five) minutes as needed for chest pain.   NOVOLOG FLEXPEN 100 UNIT/ML FlexPen Generic drug:  insulin aspart INJECT 25 UNITS INTO THE SKIN THREE TIMES DAILY USING A SLIDING SCALE   Oxycodone HCl 10 MG Tabs Take 10 mg by mouth 4 (four) times daily as needed (for  pain).   simvastatin 40 MG tablet Commonly known as:  ZOCOR Take 1 tablet (40 mg total) by mouth at bedtime.   tamsulosin 0.4 MG Caps capsule Commonly known as:  FLOMAX Take 1 capsule (0.4 mg total) by mouth daily. Start taking on:  03/05/2018   torsemide 20 MG tablet Commonly known as:  DEMADEX Take 1 tablet (20 mg total) by mouth daily. Please discuss with your nephrologist Dr Marlowe Sax regarding further dosing adjustment of this meds. Please discuss with Dr Marlowe Sax regarding timing of starting on dialysis. Start taking on:  03/07/2018 What changed:    when to take this  additional instructions  These instructions start on 03/07/2018. If you are unsure what to do until then, ask your doctor or other care provider.   TRESIBA FLEXTOUCH 100 UNIT/ML Sopn FlexTouch Pen Generic drug:  insulin degludec INJECT 50 UNITS SUBCUTANEOUSLY ONCE DAILY AT  10  PM   Vitamin D 2000 units tablet Take 2,000 Units by mouth daily.      No Known Allergies Follow-up Information    Festus Aloe, MD. Call in 1 week(s).   Specialty:  Urology Why:  for hematuria  and quetions regarding foley catheter managment Contact information: [place] [address] [phone]        Martinique, Betty G, MD Follow up in 2 week(s).   Specialty:  Family Medicine Why:  hospital discharge follow up Contact information: [place] [address] [phone]        Rollen Sox, MD Follow up in 1 week(s).   Specialty:   Internal Medicine Why:  to discuss starting dialysis            The results of significant diagnostics from this hospitalization (including imaging, microbiology, ancillary and laboratory) are listed below for reference.    Significant Diagnostic Studies: No results found.  Microbiology: Recent Results (from the past 240 hour(s))  Urine Culture     Status: None   Collection Time: 03/01/18  1:20 AM  Result Value Ref Range Status   Specimen Description URINE, RANDOM  Final   Special Requests NONE  Final   Culture   Final    NO GROWTH Performed at [facility], [address], [place], [place] [address]    Report Status 03/03/2018 FINAL  Final  Culture, blood (Routine X 2) w Reflex to ID Panel     Status: None (Preliminary result)   Collection Time: 03/02/18  4:05 AM  Result Value Ref Range Status   Specimen Description BLOOD LEFT HAND  Final   Special Requests   Final    BOTTLES DRAWN AEROBIC ONLY Blood Culture adequate volume   Culture   Final    NO GROWTH 2 DAYS Performed at [facility], [place] [address], [place], [place] [address]    Report Status PENDING  Incomplete  Culture, blood (Routine X 2) w Reflex to ID Panel     Status: None (Preliminary result)   Collection Time: 03/02/18  4:05 AM  Result Value Ref Range Status   Specimen Description BLOOD LEFT HAND  Final   Special Requests   Final    BOTTLES DRAWN AEROBIC ONLY Blood Culture adequate volume   Culture   Final    NO GROWTH 2 DAYS Performed at [facility], [place] [address], [place], [place] [address]    Report Status PENDING  Incomplete     Labs: Basic Metabolic Panel: Recent Labs  Lab 02/28/18 2327 03/01/18 1852 03/02/18 0405 03/03/18 0456 03/03/18 1308 03/04/18 0412  NA 138 136 136 138  --  138  K 4.9 4.9 4.6 4.6  --  4.6  CL 99 100 100 101  --  102  CO2 23 22 22 24   --  22  GLUCOSE 122* 137* 112* 151*  --  142*  BUN 115* 124* 127* 137*  --  148*  CREATININE 7.13* 8.12*  7.65* 8.40*  --  8.50*  CALCIUM 8.9 8.5* 8.4* 8.2*  --  7.9*  PHOS  --   --   --   --  6.5*  --    Liver Function Tests: Recent Labs  Lab 03/03/18 0456  AST 17  ALT 10  ALKPHOS 49  BILITOT 0.9  PROT 6.0*  ALBUMIN 3.3*   No results for input(s): LIPASE, AMYLASE in the last 168 hours. No results for input(s): AMMONIA in the last 168 hours. CBC: Recent Labs  Lab 02/28/18 2327 03/01/18 1852 03/02/18 0405 03/03/18 0456 03/04/18 0412  WBC 14.5* 9.7 10.6* 7.5 7.6  NEUTROABS  --  7.4  --   --   --   HGB 9.6* 8.8* 8.5* 7.9* 7.8*  HCT 30.5* 27.5* 26.4* 24.8* 24.3*  MCV 93.8 93.2 93.6 94.3 94.6  PLT 202 166 155 138* 136*   Cardiac Enzymes: No results for input(s): CKTOTAL, CKMB, CKMBINDEX, TROPONINI in the last 168 hours. BNP: BNP (last 3 results) No results for input(s): BNP in the last 8760 hours.  ProBNP (last 3 results) No results for input(s): PROBNP in the last 8760 hours.  CBG: Recent Labs  Lab 03/03/18 1155 03/03/18 1652 03/03/18 2039 03/04/18 0741 03/04/18 1148  GLUCAP 178* 203* 223* 137* 153*       Signed:  Florencia Reasons MD, PhD  [facility] 03/04/2018, 12:03 PM

## 2018-03-04 NOTE — Progress Notes (Signed)
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     Stop metolazone.  Needs to start dialysis, but he wants to wait which is ok  2. Anemia - iron deficient, give IV iron 3. Renal osteodystrophy - add binders 4. Hematuria -? L Renal Mass, seen by urology 5. HTN - continue home regimen for now. 6. DM 7. CASHD - stable  Subjective: Interval History: Does not want to start dialysis  Objective: Vital signs in last 24 hours: Temp:  [98.1 F (36.7 C)-98.4 F (36.9 C)] 98.4 F (36.9 C) (07/15 0602) Pulse Rate:  [72-93] 91 (07/15 0602) Resp:  [16-18] 18 (07/15 0602) BP: (130-155)/(46-77) 130/46 (07/15 0839) SpO2:  [100 %] 100 % (07/15 0602) Weight change:   Intake/Output from previous day: 07/14 0701 - 07/15 0700 In: 1623.4 [P.O.:720; I.V.:903.4] Out: 1750 [Urine:1750] Intake/Output this shift: Total I/O In: 300 [P.O.:300] Out: -   General appearance: alert and cooperative Resp: clear to auscultation bilaterally Chest wall: no tenderness Cardio: regular rate and rhythm, S1, S2 normal, no murmur, click, rub or gallop GI: soft, non-tender; bowel sounds normal; no masses,  no organomegaly Extremities: edema 1+ RUE AVF immature  Lab Results: Recent Labs    03/03/18 0456 03/04/18 0412  WBC 7.5 7.6  HGB 7.9* 7.8*  HCT 24.8* 24.3*  PLT 138* 136*   BMET:  Recent Labs    03/03/18 0456 03/04/18 0412  NA 138 138  K 4.6 4.6  CL 101 102  CO2 24 22  GLUCOSE 151* 142*  BUN 137* 148*  CREATININE 8.40* 8.50*  CALCIUM 8.2* 7.9*   No results for input(s): PTH in the last 72 hours. Iron Studies:  Recent Labs    03/04/18 0412  IRON 36*  TIBC 273  FERRITIN 47   Studies/Results: No results found.  Scheduled: . amitriptyline  12.5 mg Oral QHS  . aspirin EC  81 mg Oral Daily  . calcitRIOL  0.25 mcg Oral Daily  . calcium carbonate  1 tablet Oral BID WC  . carvedilol  6.25 mg Oral BID  WC  . cephALEXin  250 mg Oral Q12H  . cholecalciferol  2,000 Units Oral Daily  . famotidine  40 mg Oral QHS  . hydrALAZINE  50 mg Oral TID  . insulin aspart  0-5 Units Subcutaneous QHS  . insulin aspart  0-9 Units Subcutaneous TID WC  . insulin glargine  15 Units Subcutaneous QHS  . isosorbide mononitrate  60 mg Oral Daily  . magnesium oxide  400 mg Oral Daily  . montelukast  10 mg Oral QHS  . simvastatin  40 mg Oral QHS  . tamsulosin  0.4 mg Oral Daily     LOS: 3 days   Estanislado Emms 03/04/2018,9:55 AM

## 2018-03-04 NOTE — Progress Notes (Signed)
Kent Osborne to be D/C'd Home per MD order.  Discussed with the patient and all questions fully answered.  VSS, Skin clean, dry and intact without evidence of skin break down, no evidence of skin tears noted. IV catheter discontinued intact. Site without signs and symptoms of complications. Dressing and pressure applied.  An After Visit Summary was printed and given to the patient. Patient received prescription.  D/c education completed with patient/family including follow up instructions, medication list, d/c activities limitations if indicated, with other d/c instructions as indicated by MD - patient able to verbalize understanding, all questions fully answered.   Patient instructed to return to ED, call 911, or call MD for any changes in condition.   Patient is waiting on his daughter to come pick him up. Will continue to assess.  Kent Osborne Kent Osborne 03/04/2018 1:19 PM

## 2018-03-05 ENCOUNTER — Telehealth: Payer: Self-pay | Admitting: Family Medicine

## 2018-03-05 ENCOUNTER — Other Ambulatory Visit: Payer: Self-pay | Admitting: Urology

## 2018-03-05 DIAGNOSIS — N281 Cyst of kidney, acquired: Secondary | ICD-10-CM

## 2018-03-05 DIAGNOSIS — R339 Retention of urine, unspecified: Secondary | ICD-10-CM | POA: Diagnosis not present

## 2018-03-05 DIAGNOSIS — R310 Gross hematuria: Secondary | ICD-10-CM | POA: Diagnosis not present

## 2018-03-05 DIAGNOSIS — E1140 Type 2 diabetes mellitus with diabetic neuropathy, unspecified: Secondary | ICD-10-CM | POA: Diagnosis not present

## 2018-03-05 NOTE — Telephone Encounter (Signed)
Admit date: 03/01/2018 Discharge date: 03/04/2018  Time spent: 28mins, more than 50% time spent on coordination of care  Recommendations for Outpatient Follow-up:  1. F/u with PMD within a week  for hospital discharge follow up, repeat cbc/bmp at follow up. 2. F/u with nephrology in one week to decide on timing of dialysis 3. F/u with urology in one week   Transition Care Management Follow-up Telephone Call   Date discharged? 03/01/18   How have you been since you were released from the hospital? "Well I have only been out 1 day but I have been doing good so far. I saw the Urologist today and they removed my catheter."   Do you understand why you were in the hospital? yes   Do you understand the discharge instructions? yes   Where were you discharged to? Home   Items Reviewed:  Medications reviewed: yes  Allergies reviewed: yes  Dietary changes reviewed: yes  Referrals reviewed: yes   Functional Questionnaire:   Activities of Daily Living (ADLs):   He states they are independent in the following: ambulation, bathing and hygiene, feeding, continence, grooming, toileting, dressing and "I am able to drive but since I am weak I am having someone else drive me around" States they require assistance with the following: Driving due to be weak   Any transportation issues/concerns?: no   Any patient concerns? no   Confirmed importance and date/time of follow-up visits scheduled yes  Provider Appointment booked with Dr Martinique on Tuesday 03/19/18 at 10am  Confirmed with patient if condition begins to worsen call PCP or go to the ER.  Patient was given the office number and encouraged to call back with question or concerns.  : yes

## 2018-03-06 ENCOUNTER — Emergency Department (HOSPITAL_COMMUNITY)
Admission: EM | Admit: 2018-03-06 | Discharge: 2018-03-06 | Disposition: A | Discharge status: Home / Self Care | Payer: Medicare Other | Attending: Emergency Medicine | Admitting: Emergency Medicine

## 2018-03-06 ENCOUNTER — Emergency Department (HOSPITAL_COMMUNITY)
Admission: EM | Admit: 2018-03-06 | Discharge: 2018-03-06 | Discharge status: Absent without leave | Payer: Medicare Other | Source: Home / Self Care

## 2018-03-06 ENCOUNTER — Other Ambulatory Visit: Payer: Self-pay

## 2018-03-06 ENCOUNTER — Encounter (HOSPITAL_COMMUNITY): Payer: Self-pay | Admitting: Emergency Medicine

## 2018-03-06 DIAGNOSIS — Z7982 Long term (current) use of aspirin: Secondary | ICD-10-CM | POA: Insufficient documentation

## 2018-03-06 DIAGNOSIS — Z79899 Other long term (current) drug therapy: Secondary | ICD-10-CM | POA: Insufficient documentation

## 2018-03-06 DIAGNOSIS — Z794 Long term (current) use of insulin: Secondary | ICD-10-CM | POA: Insufficient documentation

## 2018-03-06 DIAGNOSIS — I120 Hypertensive chronic kidney disease with stage 5 chronic kidney disease or end stage renal disease: Secondary | ICD-10-CM | POA: Diagnosis not present

## 2018-03-06 DIAGNOSIS — Z955 Presence of coronary angioplasty implant and graft: Secondary | ICD-10-CM | POA: Diagnosis not present

## 2018-03-06 DIAGNOSIS — E1140 Type 2 diabetes mellitus with diabetic neuropathy, unspecified: Secondary | ICD-10-CM | POA: Diagnosis not present

## 2018-03-06 DIAGNOSIS — I252 Old myocardial infarction: Secondary | ICD-10-CM | POA: Diagnosis not present

## 2018-03-06 DIAGNOSIS — R310 Gross hematuria: Secondary | ICD-10-CM | POA: Diagnosis not present

## 2018-03-06 DIAGNOSIS — J45909 Unspecified asthma, uncomplicated: Secondary | ICD-10-CM | POA: Diagnosis not present

## 2018-03-06 DIAGNOSIS — I2510 Atherosclerotic heart disease of native coronary artery without angina pectoris: Secondary | ICD-10-CM | POA: Diagnosis not present

## 2018-03-06 DIAGNOSIS — N185 Chronic kidney disease, stage 5: Secondary | ICD-10-CM | POA: Diagnosis not present

## 2018-03-06 DIAGNOSIS — R319 Hematuria, unspecified: Secondary | ICD-10-CM | POA: Diagnosis present

## 2018-03-06 DIAGNOSIS — E1122 Type 2 diabetes mellitus with diabetic chronic kidney disease: Secondary | ICD-10-CM | POA: Insufficient documentation

## 2018-03-06 DIAGNOSIS — N189 Chronic kidney disease, unspecified: Secondary | ICD-10-CM

## 2018-03-06 DIAGNOSIS — D631 Anemia in chronic kidney disease: Secondary | ICD-10-CM | POA: Diagnosis not present

## 2018-03-06 LAB — CBC WITH DIFFERENTIAL/PLATELET
Abs Immature Granulocytes: 0 10*3/uL (ref 0.0–0.1)
Basophils Absolute: 0 10*3/uL (ref 0.0–0.1)
Basophils Relative: 0 %
EOS ABS: 0.1 10*3/uL (ref 0.0–0.7)
EOS PCT: 1 %
HEMATOCRIT: 27.5 % — AB (ref 39.0–52.0)
Hemoglobin: 8.8 g/dL — ABNORMAL LOW (ref 13.0–17.0)
Immature Granulocytes: 1 %
LYMPHS ABS: 2.2 10*3/uL (ref 0.7–4.0)
LYMPHS PCT: 27 %
MCH: 29.9 pg (ref 26.0–34.0)
MCHC: 32 g/dL (ref 30.0–36.0)
MCV: 93.5 fL (ref 78.0–100.0)
MONOS PCT: 8 %
Monocytes Absolute: 0.6 10*3/uL (ref 0.1–1.0)
Neutro Abs: 5.2 10*3/uL (ref 1.7–7.7)
Neutrophils Relative %: 63 %
Platelets: 151 10*3/uL (ref 150–400)
RBC: 2.94 MIL/uL — AB (ref 4.22–5.81)
RDW: 12.7 % (ref 11.5–15.5)
WBC: 8.2 10*3/uL (ref 4.0–10.5)

## 2018-03-06 LAB — BASIC METABOLIC PANEL
ANION GAP: 13 (ref 5–15)
BUN: 148 mg/dL — ABNORMAL HIGH (ref 8–23)
CHLORIDE: 101 mmol/L (ref 98–111)
CO2: 23 mmol/L (ref 22–32)
CREATININE: 7.79 mg/dL — AB (ref 0.61–1.24)
Calcium: 8.5 mg/dL — ABNORMAL LOW (ref 8.9–10.3)
GFR calc non Af Amer: 6 mL/min — ABNORMAL LOW (ref >60)
GFR, EST AFRICAN AMERICAN: 7 mL/min — AB (ref >60)
Glucose, Bld: 115 mg/dL — ABNORMAL HIGH (ref 70–99)
Potassium: 4.8 mmol/L (ref 3.5–5.1)
Sodium: 137 mmol/L (ref 135–145)

## 2018-03-06 LAB — URINALYSIS, ROUTINE W REFLEX MICROSCOPIC
BILIRUBIN URINE: NEGATIVE
GLUCOSE, UA: 50 mg/dL — AB
Ketones, ur: NEGATIVE mg/dL
Leukocytes, UA: NEGATIVE
NITRITE: NEGATIVE
Protein, ur: 100 mg/dL — AB
Specific Gravity, Urine: 1.01 (ref 1.005–1.030)
pH: 6 (ref 5.0–8.0)

## 2018-03-06 NOTE — ED Provider Notes (Signed)
[facility] EMERGENCY DEPARTMENT Provider Note   CSN: [id number] Arrival date & time: 03/06/18  6222     History   Chief Complaint Chief Complaint  Patient presents with  . Hematuria    HPI Kent Osborne is a 67 y.o. male.  The history is provided by the patient.  He has history of hypertension, hyperlipidemia, diabetes, coronary artery disease, renal failure and comes in with ongoing problems with hematuria.  He has been in the hospital several times with urinary retention secondary to hematuria and clots.  He saw his urologist yesterday who did a cystoscopy.  He has a mass on his left kidney which is scheduled for MRI next week.  Tonight, he had an episode where he felt like he was unable to urinate, that he was then did pass a clot.  Urine has sometimes been grossly bloody and sometimes only pink-tinged.  He is currently able to urinate.  He is not currently on any anticoagulants or antiplatelet agents.  He denies fever or chills.  Denies abdominal pain or flank pain.  Past Medical History:  Diagnosis Date  . Acute renal failure superimposed on chronic kidney disease ([place])    [name] 11/01/2016  . Anemia   . Arthritis    "back, knees" (11/01/2016)  . Asthma   . CAD (coronary artery disease) 12/31/07   [facility] stents- placed in the circumflex and LAD  . Chronic bronchitis ([place])   . History of blood transfusion 10/30/2016   "related to anemia"  . History of gout   . History of kidney stones   . Hyperlipidemia   . Hypertension   . NSTEMI (non-ST elevated myocardial infarction) ([place]) 10/30/2016  . OA (osteoarthritis)   . Obese   . On home oxygen therapy    "2L w/CPAP" (11/01/2016)  . OSA on CPAP   . Psoriatic arthritis ([place])   . Type II diabetes mellitus [facility])     Patient Active Problem List   Diagnosis Date Noted  . Type II diabetes mellitus with renal manifestations ([place]) 03/01/2018  . GERD (gastroesophageal reflux disease) 03/01/2018    . Depression 03/01/2018  . Urinary retention 03/01/2018  . Normocytic anemia 03/01/2018  . BPH associated with nocturia 12/24/2017  . Elevated troponin 11/01/2016  . NSTEMI (non-ST elevated myocardial infarction) ([place]) 11/01/2016  . Acute renal failure superimposed on stage 4 chronic kidney disease ([place]) 11/01/2016  . Diabetes mellitus with complication ([place]) 97/98/9211  . COPD (chronic obstructive pulmonary disease) ([place]) 11/01/2016  . OSA (obstructive sleep apnea) 11/01/2016  . Symptomatic anemia 10/30/2016  . Morbid obesity with BMI of 45.0-49.9, adult ([place]) 04/29/2016  . Gout, arthropathy 04/27/2016  . Diabetic peripheral neuropathy associated with type 2 diabetes mellitus ([place]) 08/20/2014  . Type II diabetes mellitus with circulatory disorder, uncontrolled 07/09/2014  . Renal insufficiency 02/29/2012  . HEMATURIA 07/27/2010  . GOUT, UNSPECIFIED 07/05/2010  . BACK PAIN, LUMBAR 04/18/2010  . Backache 01/06/2010  . GENERALIZED OSTEOARTHROSIS INVOLVING HAND 07/20/2009  . Chronic kidney disease (CKD), stage IV (severe) ([place]) 04/12/2009  . PERIPHERAL EDEMA 12/31/2008  . Asthma with acute exacerbation 10/08/2008  . Osteoarthritis 07/28/2008  . Hyperlipemia 01/06/2008  . Coronary atherosclerosis 01/06/2008  . Chronic ischemic heart disease 12/31/2007  . Essential hypertension 02/21/2007  . CHOLELITHIASIS 02/21/2007  . NEPHROLITHIASIS, HX OF 02/21/2007    Past Surgical History:  Procedure Laterality Date  . AV FISTULA PLACEMENT    . CORONARY ANGIOPLASTY WITH STENT PLACEMENT  2009   "  3 stents"        Home Medications    Prior to Admission medications   Medication Sig Start Date End Date Taking? Authorizing Provider  albuterol (PROVENTIL HFA;VENTOLIN HFA) 108 (90 Base) MCG/ACT inhaler Inhale 2 puffs into the lungs every 6 (six) hours as needed for wheezing or shortness of breath. Patient not taking: Reported on 03/01/2018 10/30/16   Martinique, Betty G, MD  amitriptyline  (ELAVIL) 25 MG tablet Take 12.5 mg by mouth at bedtime.    [provider]  aspirin EC 81 MG tablet Take 81 mg by mouth daily.    [provider]  calcitRIOL (ROCALTROL) 0.25 MCG capsule Take 0.25 mcg by mouth daily.    [provider]  calcium acetate (PHOSLO) 667 MG capsule Take 1 capsule (667 mg total) by mouth 3 (three) times daily with meals. 03/04/18   Florencia Reasons, MD  carvedilol (COREG) 6.25 MG tablet TAKE 1 TABLET BY MOUTH TWICE DAILY WITH  MEALS 03/01/18   [place]ique, Betty G, MD  cephALEXin (KEFLEX) 500 MG capsule Take 1 capsule (500 mg total) by mouth daily. 03/01/18   Ripley Fraise, MD  Cholecalciferol (VITAMIN D) 2000 units tablet Take 2,000 Units by mouth daily.    [provider]  glipiZIDE (GLUCOTROL) 10 MG tablet TAKE 1 TABLET BY MOUTH ONCE DAILY BEFORE BREAKFAST 11/09/17   Martinique, Betty G, MD  glucose blood (ONE TOUCH ULTRA TEST) test strip USE ONE STRIP TO CHECK GLUCOSE 4 TIMES DAILY 11/22/17   [place], Betty G, MD  guaiFENesin (MUCINEX) 600 MG 12 hr tablet Take 1 tablet (600 mg total) by mouth every 12 (twelve) hours as needed for to loosen phlegm. Patient not taking: Reported on 03/01/2018 11/03/16   Rai, Vernelle Emerald, MD  hydrALAZINE (APRESOLINE) 50 MG tablet Take 1 tablet (50 mg total) by mouth 3 (three) times daily. 03/09/17   [place], Betty G, MD  isosorbide mononitrate (IMDUR) 60 MG 24 hr tablet TAKE 1 TABLET BY MOUTH ONCE DAILY 12/14/17   [place], Betty G, MD  montelukast (SINGULAIR) 10 MG tablet Take 10 mg by mouth at bedtime.     [provider]  nitroGLYCERIN (NITROSTAT) 0.4 MG SL tablet Place 1 tablet (0.4 mg total) under the tongue every 5 (five) minutes as needed for chest pain. 11/03/16   Rai, Ripudeep K, MD  NOVOLOG FLEXPEN 100 UNIT/ML FlexPen  INJECT 25 UNITS INTO THE SKIN THREE TIMES DAILY USING A SLIDING SCALE 11/09/17   [place], Betty G, MD  Oxycodone HCl 10 MG TABS Take 10 mg by mouth 4 (four) times daily as needed (for pain).     Arcedo,  Perico, DO  simvastatin (ZOCOR) 40 MG tablet Take 1 tablet (40 mg total) by mouth at bedtime. 03/09/17   [place]ique, Betty G, MD  tamsulosin (FLOMAX) 0.4 MG CAPS capsule Take 1 capsule (0.4 mg total) by mouth daily. 03/05/18   Florencia Reasons, MD  torsemide (DEMADEX) 20 MG tablet Take 1 tablet (20 mg total) by mouth daily. Please discuss with your nephrologist Dr Marlowe Sax regarding further dosing adjustment of this meds. Please discuss with Dr Marlowe Sax regarding timing of starting on dialysis. 03/07/18   Florencia Reasons, MD  TRESIBA FLEXTOUCH 100 UNIT/ML SOPN FlexTouch Pen INJECT 50 UNITS SUBCUTANEOUSLY ONCE DAILY AT  10  PM 02/18/18   Martinique, Betty G, MD    Family History Family History  Problem Relation Age of Onset  . Diabetes Other   . Hyperlipidemia Other   . Hypertension Other  Social History Social History   Tobacco Use  . Smoking status: Never Smoker  . Smokeless tobacco: Never Used  Substance Use Topics  . Alcohol use: Not Currently    Comment: 11/01/2016 "nothing in years"  . Drug use: No     Allergies   Patient has no known allergies.   Review of Systems Review of Systems  All other systems reviewed and are negative.    Physical Exam Updated Vital Signs BP (!) 158/63   Pulse 94   Temp 98.4 F (36.9 C)   Resp 19   Ht 5\' 7"  (1.702 m)   Wt 133.8 kg (295 lb)   SpO2 99%   BMI 46.20 kg/m   Physical Exam  Nursing note and vitals reviewed.  Morbidly obese 67 year old male, resting comfortably and in no acute distress. Vital signs are significant for elevated systolic blood pressure. Oxygen saturation is 99%, which is normal. Head is normocephalic and atraumatic. PERRLA, EOMI. Oropharynx is clear. Neck is nontender and supple without adenopathy or JVD. Back is nontender and there is no CVA tenderness. Lungs are clear without rales, wheezes, or rhonchi. Chest is nontender. Heart has regular rate and rhythm without murmur. Abdomen is soft, flat, nontender without masses or  hepatosplenomegaly and peristalsis is normoactive. Extremities have 2+ edema, full range of motion is present.  AV fistula is present in the right antecubital area with thrill present. Skin is warm and dry without rash. Neurologic: Mental status is normal, cranial nerves are intact, there are no motor or sensory deficits.  ED Treatments / Results  Labs (all labs ordered are listed, but only abnormal results are displayed) Labs Reviewed  URINALYSIS, ROUTINE W REFLEX MICROSCOPIC - Abnormal; Notable for the following components:      Result Value   Color, Urine AMBER (*)    APPearance HAZY (*)    Glucose, UA 50 (*)    Hgb urine dipstick LARGE (*)    Protein, ur 100 (*)    RBC / HPF >50 (*)    Bacteria, UA RARE (*)    All other components within normal limits  CBC WITH DIFFERENTIAL/PLATELET - Abnormal; Notable for the following components:   RBC 2.94 (*)    Hemoglobin 8.8 (*)    HCT 27.5 (*)    All other components within normal limits  BASIC METABOLIC PANEL - Abnormal; Notable for the following components:   Glucose, Bld 115 (*)    BUN 148 (*)    Creatinine, Ser 7.79 (*)    Calcium 8.5 (*)    GFR calc non Af Amer 6 (*)    GFR calc Af Amer 7 (*)    All other components within normal limits   Procedures Procedures  Medications Ordered in ED Medications - No data to display   Initial Impression / Assessment and Plan / ED Course  I have reviewed the triage vital signs and the nursing notes.  Pertinent lab results that were available during my care of the patient were reviewed by me and considered in my medical decision making (see chart for details).  Ongoing hematuria with intermittent urinary retention secondary to obstruction by clots.  Old records are reviewed confirming recent ED visit and hospitalization for urinary retention.  Creatinine had been running between 5.5 and 6.5, increased to 7 on July 11, 8.5 on July 15.  He currently does not have distended bladder by exam  or by ultrasound bladder scan.  Cystoscopy had not shown a cause  of bleeding, suspect is coming from the kidneys, possibly from his left renal mass.  I do not see any benefit from bladder irrigation since he does not have obstruction currently, and bleeding source does not seem to be from the bladder.  Will check renal function and electrolytes and also will check CBC and urinalysis.  Urine culture 5 days ago had no growth.  Hemoglobin had dropped from baseline of 10, to 7.8.  Patient has not had any problems with urinary retention while in the ED.  Hemoglobin has risen to 8.8, creatinine has fallen to 7.79.  Patient is discharged with instructions to follow-up with his urologist, return should he have recurrent urinary retention.  Final Clinical Impressions(s) / ED Diagnoses   Final diagnoses:  Gross hematuria  Stage 5 chronic kidney disease not on chronic dialysis ([place])  Anemia associated with chronic renal failure    ED Discharge Orders    None       Delora Fuel, MD 93/26/71 [phone]

## 2018-03-06 NOTE — ED Triage Notes (Signed)
Pt just DC from hospital 7/15 for hematuria and urinary retention. Pt had his Foley removed yesterday at urologist. Pt states he started passing clots again this evening.

## 2018-03-06 NOTE — Discharge Instructions (Addendum)
Return if you are unable to pass your urine. Otherwise, follow up with your urologist and your nephrologist.

## 2018-03-07 DIAGNOSIS — R339 Retention of urine, unspecified: Secondary | ICD-10-CM | POA: Diagnosis not present

## 2018-03-07 DIAGNOSIS — E211 Secondary hyperparathyroidism, not elsewhere classified: Secondary | ICD-10-CM | POA: Diagnosis not present

## 2018-03-07 DIAGNOSIS — E1140 Type 2 diabetes mellitus with diabetic neuropathy, unspecified: Secondary | ICD-10-CM | POA: Diagnosis not present

## 2018-03-07 DIAGNOSIS — G4730 Sleep apnea, unspecified: Secondary | ICD-10-CM | POA: Diagnosis not present

## 2018-03-07 DIAGNOSIS — R609 Edema, unspecified: Secondary | ICD-10-CM | POA: Diagnosis not present

## 2018-03-07 DIAGNOSIS — E559 Vitamin D deficiency, unspecified: Secondary | ICD-10-CM | POA: Diagnosis not present

## 2018-03-07 DIAGNOSIS — I2510 Atherosclerotic heart disease of native coronary artery without angina pectoris: Secondary | ICD-10-CM | POA: Diagnosis not present

## 2018-03-07 LAB — CULTURE, BLOOD (ROUTINE X 2)
CULTURE: NO GROWTH
Culture: NO GROWTH
Special Requests: ADEQUATE
Special Requests: ADEQUATE

## 2018-03-08 DIAGNOSIS — R3915 Urgency of urination: Secondary | ICD-10-CM | POA: Diagnosis not present

## 2018-03-08 DIAGNOSIS — R3916 Straining to void: Secondary | ICD-10-CM | POA: Diagnosis not present

## 2018-03-13 ENCOUNTER — Telehealth: Payer: Self-pay | Admitting: Family Medicine

## 2018-03-13 ENCOUNTER — Other Ambulatory Visit: Payer: Self-pay | Admitting: Family Medicine

## 2018-03-13 DIAGNOSIS — E785 Hyperlipidemia, unspecified: Secondary | ICD-10-CM

## 2018-03-13 NOTE — Telephone Encounter (Signed)
Copied from [place] [id number]. Topic: Quick Communication - See Telephone Encounter >> Mar 13, 2018  4:06 PM Gardiner Ramus wrote: CRM for notification. See Telephone encounter for: 03/13/18. Pt called and stated that he has gout and would like something called in for him. Please advise.

## 2018-03-14 NOTE — Telephone Encounter (Signed)
Okay for refill? Please advise as Dr.jordan is on vacation Thank you!

## 2018-03-15 ENCOUNTER — Ambulatory Visit
Admission: RE | Admit: 2018-03-15 | Discharge: 2018-03-15 | Disposition: A | Discharge status: Home / Self Care | Payer: Medicare Other | Source: Ambulatory Visit | Attending: Urology | Admitting: Urology

## 2018-03-15 ENCOUNTER — Other Ambulatory Visit: Payer: Self-pay | Admitting: Urology

## 2018-03-15 DIAGNOSIS — N281 Cyst of kidney, acquired: Secondary | ICD-10-CM | POA: Diagnosis not present

## 2018-03-15 DIAGNOSIS — R310 Gross hematuria: Secondary | ICD-10-CM

## 2018-03-18 ENCOUNTER — Other Ambulatory Visit: Payer: Self-pay | Admitting: *Deleted

## 2018-03-18 DIAGNOSIS — M109 Gout, unspecified: Secondary | ICD-10-CM

## 2018-03-18 MED ORDER — PREDNISONE 20 MG PO TABS: 20.0000 mg | ORAL_TABLET | Freq: Two times a day (BID) | ORAL | 0 refills | Status: DC

## 2018-03-18 NOTE — Telephone Encounter (Signed)
Rx sent to pharmacy as requested per Dr. Raliegh Ip.

## 2018-03-18 NOTE — Telephone Encounter (Signed)
Prednisone 20 mg #12 1 twice daily

## 2018-03-19 ENCOUNTER — Inpatient Hospital Stay: Payer: Medicare Other | Admitting: Family Medicine

## 2018-03-19 DIAGNOSIS — E119 Type 2 diabetes mellitus without complications: Secondary | ICD-10-CM | POA: Diagnosis not present

## 2018-03-19 DIAGNOSIS — N185 Chronic kidney disease, stage 5: Secondary | ICD-10-CM | POA: Diagnosis not present

## 2018-03-19 DIAGNOSIS — I10 Essential (primary) hypertension: Secondary | ICD-10-CM | POA: Diagnosis not present

## 2018-03-21 DIAGNOSIS — E1140 Type 2 diabetes mellitus with diabetic neuropathy, unspecified: Secondary | ICD-10-CM | POA: Diagnosis not present

## 2018-03-22 DIAGNOSIS — N185 Chronic kidney disease, stage 5: Secondary | ICD-10-CM | POA: Diagnosis not present

## 2018-03-22 DIAGNOSIS — D649 Anemia, unspecified: Secondary | ICD-10-CM | POA: Diagnosis not present

## 2018-03-22 DIAGNOSIS — Z1159 Encounter for screening for other viral diseases: Secondary | ICD-10-CM | POA: Diagnosis not present

## 2018-03-22 DIAGNOSIS — N179 Acute kidney failure, unspecified: Secondary | ICD-10-CM | POA: Diagnosis not present

## 2018-03-27 DIAGNOSIS — D631 Anemia in chronic kidney disease: Secondary | ICD-10-CM | POA: Diagnosis not present

## 2018-03-27 DIAGNOSIS — Z13220 Encounter for screening for lipoid disorders: Secondary | ICD-10-CM | POA: Diagnosis not present

## 2018-03-27 DIAGNOSIS — N2581 Secondary hyperparathyroidism of renal origin: Secondary | ICD-10-CM | POA: Diagnosis not present

## 2018-03-27 DIAGNOSIS — Z23 Encounter for immunization: Secondary | ICD-10-CM | POA: Diagnosis not present

## 2018-03-27 DIAGNOSIS — E8809 Other disorders of plasma-protein metabolism, not elsewhere classified: Secondary | ICD-10-CM | POA: Diagnosis not present

## 2018-03-27 DIAGNOSIS — E1129 Type 2 diabetes mellitus with other diabetic kidney complication: Secondary | ICD-10-CM | POA: Diagnosis not present

## 2018-03-27 DIAGNOSIS — D500 Iron deficiency anemia secondary to blood loss (chronic): Secondary | ICD-10-CM | POA: Diagnosis not present

## 2018-03-27 DIAGNOSIS — D6959 Other secondary thrombocytopenia: Secondary | ICD-10-CM | POA: Diagnosis not present

## 2018-03-27 DIAGNOSIS — N186 End stage renal disease: Secondary | ICD-10-CM | POA: Diagnosis not present

## 2018-03-29 DIAGNOSIS — D500 Iron deficiency anemia secondary to blood loss (chronic): Secondary | ICD-10-CM | POA: Diagnosis not present

## 2018-03-29 DIAGNOSIS — D6959 Other secondary thrombocytopenia: Secondary | ICD-10-CM | POA: Diagnosis not present

## 2018-03-29 DIAGNOSIS — N186 End stage renal disease: Secondary | ICD-10-CM | POA: Diagnosis not present

## 2018-03-29 DIAGNOSIS — N2581 Secondary hyperparathyroidism of renal origin: Secondary | ICD-10-CM | POA: Diagnosis not present

## 2018-03-29 DIAGNOSIS — D631 Anemia in chronic kidney disease: Secondary | ICD-10-CM | POA: Diagnosis not present

## 2018-03-29 DIAGNOSIS — E8809 Other disorders of plasma-protein metabolism, not elsewhere classified: Secondary | ICD-10-CM | POA: Diagnosis not present

## 2018-03-31 NOTE — Progress Notes (Deleted)
HPI:   [name] is a 67 y.o. male, who is here today to follow on recent hospitalization.    Admitted on 03/01/18 because urinary retention and hematuria. Discharged on   02/28/18 he presented to [facility] ED, sent home on abx and foley cath. 03/01/18 presented to [facility] ER because decreased urine output and bladder spasms. Continue bladder irrigation started, stopped before discharged.  Started on IV Rocephin and d/c on Keflex. Discharged with foley cath. *** Ucx no growth.  Efficient was held,he is still on asa.  AKI on CKD 4: Demadex discontinued by nephrologist and Metolazone help since admission. 03/06/18 he presented to the ER again because hematuria.  AVF placed early last month.  Lab Results  Component Value Date   CREATININE 7.79 (H) 03/06/2018   BUN 148 (H) 03/06/2018   NA 137 03/06/2018   K 4.8 03/06/2018   CL 101 03/06/2018   CO2 23 03/06/2018    *** Chronic anemia:  Chronic disease anemia.  Lab Results  Component Value Date   WBC 8.2 03/06/2018   HGB 8.8 (L) 03/06/2018   HCT 27.5 (L) 03/06/2018   MCV 93.5 03/06/2018   PLT 151 03/06/2018     DM II:  Lab Results  Component Value Date   HGBA1C 6.1 01/08/2018      Review of Systems    Current Outpatient Medications on File Prior to Visit  Medication Sig Dispense Refill  . amitriptyline (ELAVIL) 25 MG tablet Take 12.5 mg by mouth at bedtime.    Marland Kitchen aspirin EC 81 MG tablet Take 81 mg by mouth daily.    . calcitRIOL (ROCALTROL) 0.25 MCG capsule Take 0.25 mcg by mouth daily.    . calcium acetate (PHOSLO) 667 MG capsule Take 1 capsule (667 mg total) by mouth 3 (three) times daily with meals. 90 capsule 0  . carvedilol (COREG) 6.25 MG tablet TAKE 1 TABLET BY MOUTH TWICE DAILY WITH  MEALS 180 tablet 3  . Cholecalciferol (VITAMIN D) 2000 units tablet Take 2,000 Units by mouth daily.    Marland Kitchen glipiZIDE (GLUCOTROL) 10 MG tablet TAKE 1 TABLET BY MOUTH ONCE DAILY BEFORE BREAKFAST 90  tablet 1  . glucose blood (ONE TOUCH ULTRA TEST) test strip USE ONE STRIP TO CHECK GLUCOSE 4 TIMES DAILY 400 each 0  . hydrALAZINE (APRESOLINE) 50 MG tablet Take 1 tablet (50 mg total) by mouth 3 (three) times daily. 90 tablet 2  . isosorbide mononitrate (IMDUR) 60 MG 24 hr tablet TAKE 1 TABLET BY MOUTH ONCE DAILY 90 tablet 2  . montelukast (SINGULAIR) 10 MG tablet Take 10 mg by mouth at bedtime.     . nitroGLYCERIN (NITROSTAT) 0.4 MG SL tablet Place 1 tablet (0.4 mg total) under the tongue every 5 (five) minutes as needed for chest pain. 30 tablet 12  . NOVOLOG FLEXPEN 100 UNIT/ML FlexPen  INJECT 25 UNITS INTO THE SKIN THREE TIMES DAILY USING A SLIDING SCALE 100 mL 3  . Oxycodone HCl 10 MG TABS Take 10 mg by mouth 4 (four) times daily as needed (for pain).     . predniSONE (DELTASONE) 20 MG tablet Take 1 tablet (20 mg total) by mouth 2 (two) times daily with a meal. 12 tablet 0  . simvastatin (ZOCOR) 40 MG tablet TAKE 1 TABLET BY MOUTH EVERY DAY AT BEDTIME 90 tablet 3  . tamsulosin (FLOMAX) 0.4 MG CAPS capsule Take 1 capsule (0.4 mg total) by mouth daily. 30 capsule 0  .  torsemide (DEMADEX) 20 MG tablet Take 1 tablet (20 mg total) by mouth daily. Please discuss with your nephrologist Dr Marlowe Sax regarding further dosing adjustment of this meds. Please discuss with Dr Marlowe Sax regarding timing of starting on dialysis. 10 tablet 0  . TRESIBA FLEXTOUCH 100 UNIT/ML SOPN FlexTouch Pen INJECT 50 UNITS SUBCUTANEOUSLY ONCE DAILY AT  10  PM 15 mL 2   No current facility-administered medications on file prior to visit.      Past Medical History:  Diagnosis Date  . Acute renal failure superimposed on chronic kidney disease ([place])    [name] 11/01/2016  . Anemia   . Arthritis    "back, knees" (11/01/2016)  . Asthma   . CAD (coronary artery disease) 12/31/07   [facility] stents- placed in the circumflex and LAD  . Chronic bronchitis ([place])   . History of blood transfusion 10/30/2016   "related to  anemia"  . History of gout   . History of kidney stones   . Hyperlipidemia   . Hypertension   . NSTEMI (non-ST elevated myocardial infarction) ([place]) 10/30/2016  . OA (osteoarthritis)   . Obese   . On home oxygen therapy    "2L w/CPAP" (11/01/2016)  . OSA on CPAP   . Psoriatic arthritis ([place])   . Type II diabetes mellitus ([facility])    No Known Allergies  Social History   Socioeconomic History  . Marital status: Divorced    Spouse name: Not on file  . Number of children: Not on file  . Years of education: Not on file  . Highest education level: Not on file  Occupational History  . Not on file  Social Needs  . Financial resource strain: Not on file  . Food insecurity:    Worry: Not on file    Inability: Not on file  . Transportation needs:    Medical: Not on file    Non-medical: Not on file  Tobacco Use  . Smoking status: Never Smoker  . Smokeless tobacco: Never Used  Substance and Sexual Activity  . Alcohol use: Not Currently    Comment: 11/01/2016 "nothing in years"  . Drug use: No  . Sexual activity: Yes  Lifestyle  . Physical activity:    Days per week: Not on file    Minutes per session: Not on file  . Stress: Not on file  Relationships  . Social connections:    Talks on phone: Not on file    Gets together: Not on file    Attends religious service: Not on file    Active member of club or organization: Not on file    Attends meetings of clubs or organizations: Not on file    Relationship status: Not on file  Other Topics Concern  . Not on file  Social History Narrative  . Not on file    There were no vitals filed for this visit. There is no height or weight on file to calculate BMI.      Physical Exam  ASSESSMENT AND PLAN:  There are no diagnoses linked to this encounter.   No orders of the defined types were placed in this encounter.   No problem-specific Assessment & Plan notes found for this encounter.      Hayla Hinger G. [place],  MD  [facility]. [place] office.

## 2018-04-01 ENCOUNTER — Inpatient Hospital Stay: Payer: Medicare Other | Admitting: Family Medicine

## 2018-04-01 ENCOUNTER — Other Ambulatory Visit: Payer: Self-pay | Admitting: Urology

## 2018-04-01 DIAGNOSIS — E8809 Other disorders of plasma-protein metabolism, not elsewhere classified: Secondary | ICD-10-CM | POA: Diagnosis not present

## 2018-04-01 DIAGNOSIS — Z0289 Encounter for other administrative examinations: Secondary | ICD-10-CM

## 2018-04-01 DIAGNOSIS — D500 Iron deficiency anemia secondary to blood loss (chronic): Secondary | ICD-10-CM | POA: Diagnosis not present

## 2018-04-01 DIAGNOSIS — N2581 Secondary hyperparathyroidism of renal origin: Secondary | ICD-10-CM | POA: Diagnosis not present

## 2018-04-01 DIAGNOSIS — N186 End stage renal disease: Secondary | ICD-10-CM | POA: Diagnosis not present

## 2018-04-01 DIAGNOSIS — D6959 Other secondary thrombocytopenia: Secondary | ICD-10-CM | POA: Diagnosis not present

## 2018-04-01 DIAGNOSIS — D631 Anemia in chronic kidney disease: Secondary | ICD-10-CM | POA: Diagnosis not present

## 2018-04-03 DIAGNOSIS — D6959 Other secondary thrombocytopenia: Secondary | ICD-10-CM | POA: Diagnosis not present

## 2018-04-03 DIAGNOSIS — E8809 Other disorders of plasma-protein metabolism, not elsewhere classified: Secondary | ICD-10-CM | POA: Diagnosis not present

## 2018-04-03 DIAGNOSIS — D631 Anemia in chronic kidney disease: Secondary | ICD-10-CM | POA: Diagnosis not present

## 2018-04-03 DIAGNOSIS — N186 End stage renal disease: Secondary | ICD-10-CM | POA: Diagnosis not present

## 2018-04-03 DIAGNOSIS — D500 Iron deficiency anemia secondary to blood loss (chronic): Secondary | ICD-10-CM | POA: Diagnosis not present

## 2018-04-03 DIAGNOSIS — N2581 Secondary hyperparathyroidism of renal origin: Secondary | ICD-10-CM | POA: Diagnosis not present

## 2018-04-05 DIAGNOSIS — D631 Anemia in chronic kidney disease: Secondary | ICD-10-CM | POA: Diagnosis not present

## 2018-04-05 DIAGNOSIS — D500 Iron deficiency anemia secondary to blood loss (chronic): Secondary | ICD-10-CM | POA: Diagnosis not present

## 2018-04-05 DIAGNOSIS — D6959 Other secondary thrombocytopenia: Secondary | ICD-10-CM | POA: Diagnosis not present

## 2018-04-05 DIAGNOSIS — N2581 Secondary hyperparathyroidism of renal origin: Secondary | ICD-10-CM | POA: Diagnosis not present

## 2018-04-05 DIAGNOSIS — N186 End stage renal disease: Secondary | ICD-10-CM | POA: Diagnosis not present

## 2018-04-05 DIAGNOSIS — E8809 Other disorders of plasma-protein metabolism, not elsewhere classified: Secondary | ICD-10-CM | POA: Diagnosis not present

## 2018-04-08 DIAGNOSIS — E8809 Other disorders of plasma-protein metabolism, not elsewhere classified: Secondary | ICD-10-CM | POA: Diagnosis not present

## 2018-04-08 DIAGNOSIS — N186 End stage renal disease: Secondary | ICD-10-CM | POA: Diagnosis not present

## 2018-04-08 DIAGNOSIS — D6959 Other secondary thrombocytopenia: Secondary | ICD-10-CM | POA: Diagnosis not present

## 2018-04-08 DIAGNOSIS — D631 Anemia in chronic kidney disease: Secondary | ICD-10-CM | POA: Diagnosis not present

## 2018-04-08 DIAGNOSIS — D500 Iron deficiency anemia secondary to blood loss (chronic): Secondary | ICD-10-CM | POA: Diagnosis not present

## 2018-04-08 DIAGNOSIS — N2581 Secondary hyperparathyroidism of renal origin: Secondary | ICD-10-CM | POA: Diagnosis not present

## 2018-04-09 DIAGNOSIS — R52 Pain, unspecified: Secondary | ICD-10-CM | POA: Diagnosis not present

## 2018-04-09 DIAGNOSIS — T82898A Other specified complication of vascular prosthetic devices, implants and grafts, initial encounter: Secondary | ICD-10-CM | POA: Diagnosis not present

## 2018-04-09 DIAGNOSIS — T82858A Stenosis of vascular prosthetic devices, implants and grafts, initial encounter: Secondary | ICD-10-CM | POA: Diagnosis not present

## 2018-04-10 DIAGNOSIS — D500 Iron deficiency anemia secondary to blood loss (chronic): Secondary | ICD-10-CM | POA: Diagnosis not present

## 2018-04-10 DIAGNOSIS — D631 Anemia in chronic kidney disease: Secondary | ICD-10-CM | POA: Diagnosis not present

## 2018-04-10 DIAGNOSIS — E8809 Other disorders of plasma-protein metabolism, not elsewhere classified: Secondary | ICD-10-CM | POA: Diagnosis not present

## 2018-04-10 DIAGNOSIS — N186 End stage renal disease: Secondary | ICD-10-CM | POA: Diagnosis not present

## 2018-04-10 DIAGNOSIS — N2581 Secondary hyperparathyroidism of renal origin: Secondary | ICD-10-CM | POA: Diagnosis not present

## 2018-04-10 DIAGNOSIS — D6959 Other secondary thrombocytopenia: Secondary | ICD-10-CM | POA: Diagnosis not present

## 2018-04-12 DIAGNOSIS — D500 Iron deficiency anemia secondary to blood loss (chronic): Secondary | ICD-10-CM | POA: Diagnosis not present

## 2018-04-12 DIAGNOSIS — N186 End stage renal disease: Secondary | ICD-10-CM | POA: Diagnosis not present

## 2018-04-12 DIAGNOSIS — D631 Anemia in chronic kidney disease: Secondary | ICD-10-CM | POA: Diagnosis not present

## 2018-04-12 DIAGNOSIS — D6959 Other secondary thrombocytopenia: Secondary | ICD-10-CM | POA: Diagnosis not present

## 2018-04-12 DIAGNOSIS — N2581 Secondary hyperparathyroidism of renal origin: Secondary | ICD-10-CM | POA: Diagnosis not present

## 2018-04-12 DIAGNOSIS — E8809 Other disorders of plasma-protein metabolism, not elsewhere classified: Secondary | ICD-10-CM | POA: Diagnosis not present

## 2018-04-15 DIAGNOSIS — E8809 Other disorders of plasma-protein metabolism, not elsewhere classified: Secondary | ICD-10-CM | POA: Diagnosis not present

## 2018-04-15 DIAGNOSIS — N2581 Secondary hyperparathyroidism of renal origin: Secondary | ICD-10-CM | POA: Diagnosis not present

## 2018-04-15 DIAGNOSIS — D6959 Other secondary thrombocytopenia: Secondary | ICD-10-CM | POA: Diagnosis not present

## 2018-04-15 DIAGNOSIS — N186 End stage renal disease: Secondary | ICD-10-CM | POA: Diagnosis not present

## 2018-04-15 DIAGNOSIS — D500 Iron deficiency anemia secondary to blood loss (chronic): Secondary | ICD-10-CM | POA: Diagnosis not present

## 2018-04-15 DIAGNOSIS — D631 Anemia in chronic kidney disease: Secondary | ICD-10-CM | POA: Diagnosis not present

## 2018-04-16 DIAGNOSIS — I872 Venous insufficiency (chronic) (peripheral): Secondary | ICD-10-CM | POA: Diagnosis not present

## 2018-04-16 DIAGNOSIS — E785 Hyperlipidemia, unspecified: Secondary | ICD-10-CM | POA: Diagnosis not present

## 2018-04-16 DIAGNOSIS — I2510 Atherosclerotic heart disease of native coronary artery without angina pectoris: Secondary | ICD-10-CM | POA: Diagnosis not present

## 2018-04-16 DIAGNOSIS — I10 Essential (primary) hypertension: Secondary | ICD-10-CM | POA: Diagnosis not present

## 2018-04-16 DIAGNOSIS — N186 End stage renal disease: Secondary | ICD-10-CM | POA: Diagnosis not present

## 2018-04-17 ENCOUNTER — Inpatient Hospital Stay (HOSPITAL_COMMUNITY)
Admission: RE | Admit: 2018-04-17 | Discharge: 2018-04-17 | Disposition: A | Discharge status: Home / Self Care | Payer: Medicare Other | Source: Ambulatory Visit

## 2018-04-17 DIAGNOSIS — N186 End stage renal disease: Secondary | ICD-10-CM | POA: Diagnosis not present

## 2018-04-17 DIAGNOSIS — E8809 Other disorders of plasma-protein metabolism, not elsewhere classified: Secondary | ICD-10-CM | POA: Diagnosis not present

## 2018-04-17 DIAGNOSIS — D500 Iron deficiency anemia secondary to blood loss (chronic): Secondary | ICD-10-CM | POA: Diagnosis not present

## 2018-04-17 DIAGNOSIS — D6959 Other secondary thrombocytopenia: Secondary | ICD-10-CM | POA: Diagnosis not present

## 2018-04-17 DIAGNOSIS — N2581 Secondary hyperparathyroidism of renal origin: Secondary | ICD-10-CM | POA: Diagnosis not present

## 2018-04-17 DIAGNOSIS — D631 Anemia in chronic kidney disease: Secondary | ICD-10-CM | POA: Diagnosis not present

## 2018-04-17 NOTE — Patient Instructions (Signed)
Kent Osborne  04/17/2018   Your procedure is scheduled on: 04-23-18   Report to [facility] Main  Entrance    Report to Admitting at 5:30 AM    Call this number if you have problems the morning of surgery [phone]   Remember: Do not eat food or drink liquids :After Midnight.     Take these medicines the morning of surgery with A SIP OF WATER: Carvedilol (Coreg), Hydralazine (Apresoline), and Isosorbide Mononitrate (Imdur) DO NOT TAKE ANY DIABETIC MEDICATIONS DAY OF YOUR SURGERY                               You may not have any metal on your body including hair pins and              piercings  Do not wear jewelry, lotions, powders, cologne or deodorant             Men may shave face and neck.   Do not bring valuables to the hospital. [place].  Contacts, dentures or bridgework may not be worn into surgery.     Patients discharged the day of surgery will not be allowed to drive home.  Name and phone number of your driver:  Special Instructions: N/A              Please read over the following fact sheets you were given: _____________________________________________________________________             How to Manage Your Diabetes Before and After Surgery  Why is it important to control my blood sugar before and after surgery? . Improving blood sugar levels before and after surgery helps healing and can limit problems. . A way of improving blood sugar control is eating a healthy diet by: o  Eating less sugar and carbohydrates o  Increasing activity/exercise o  Talking with your doctor about reaching your blood sugar goals . High blood sugars (greater than 180 mg/dL) can raise your risk of infections and slow your recovery, so you will need to focus on controlling your diabetes during the weeks before surgery. . Make sure that the doctor who takes care of your diabetes knows about your planned  surgery including the date and location.  How do I manage my blood sugar before surgery? . Check your blood sugar at least 4 times a day, starting 2 days before surgery, to make sure that the level is not too high or low. o Check your blood sugar the morning of your surgery when you wake up and every 2 hours until you get to the Short Stay unit. . If your blood sugar is less than 70 mg/dL, you will need to treat for low blood sugar: o Do not take insulin. o Treat a low blood sugar (less than 70 mg/dL) with  cup of clear juice (cranberry or apple), 4 glucose tablets, OR glucose gel. o Recheck blood sugar in 15 minutes after treatment (to make sure it is greater than 70 mg/dL). If your blood sugar is not greater than 70 mg/dL on recheck, call [phone] for further instructions. . Report your blood sugar to the short stay nurse when you get to Short Stay.  . If you are admitted to  the hospital after surgery: o Your blood sugar will be checked by the staff and you will probably be given insulin after surgery (instead of oral diabetes medicines) to make sure you have good blood sugar levels. o The goal for blood sugar control after surgery is 80-180 mg/dL.   WHAT DO I DO ABOUT MY DIABETES MEDICATION?  Marland Kitchen Do not take oral diabetes medicines (pills) the morning of surgery.  . THE NIGHT BEFORE SURGERY, take     units of       insulin.       . THE MORNING OF SURGERY, take   units of         insulin.  . The day of surgery, do not take other diabetes injectables, including Byetta (exenatide), Bydureon (exenatide ER), Victoza (liraglutide), or Trulicity (dulaglutide).  . If your CBG is greater than 220 mg/dL, you may take  of your sliding scale  . (correction) dose of insulin.    For patients with insulin pumps: Contact your diabetes doctor for specific instructions before surgery. Decrease basal rates by 20% at midnight the night before your surgery. Note that if your surgery is planned to  be longer than 2 hours, your insulin pump will be removed and intravenous (IV) insulin will be started and managed by the nurses and the anesthesiologist. You will be able to restart your insulin pump once you are awake and able to manage it.  Make sure to bring insulin pump supplies to the hospital with you in case the  site needs to be changed.  Patient Signature:  Date:   Nurse Signature:  Date:   Reviewed and Endorsed by [facility] Patient Education Committee, August 2015[facility] - Preparing for Surgery Before surgery, you can play an important role.  Because skin is not sterile, your skin needs to be as free of germs as possible.  You can reduce the number of germs on your skin by washing with CHG (chlorahexidine gluconate) soap before surgery.  CHG is an antiseptic cleaner which kills germs and bonds with the skin to continue killing germs even after washing. Please DO NOT use if you have an allergy to CHG or antibacterial soaps.  If your skin becomes reddened/irritated stop using the CHG and inform your nurse when you arrive at Short Stay. Do not shave (including legs and underarms) for at least 48 hours prior to the first CHG shower.  You may shave your face/neck. Please follow these instructions carefully:  1.  Shower with CHG Soap the night before surgery and the  morning of Surgery.  2.  If you choose to wash your hair, wash your hair first as usual with your  normal  shampoo.  3.  After you shampoo, rinse your hair and body thoroughly to remove the  shampoo.                           4.  Use CHG as you would any other liquid soap.  You can apply chg directly  to the skin and wash                       Gently with a scrungie or clean washcloth.  5.  Apply the CHG Soap to your body ONLY FROM THE NECK DOWN.   Do not use on face/ open  Wound or open sores. Avoid contact with eyes, ears mouth and genitals (private parts).                       Wash face,   Genitals (private parts) with your normal soap.             6.  Wash thoroughly, paying special attention to the area where your surgery  will be performed.  7.  Thoroughly rinse your body with warm water from the neck down.  8.  DO NOT shower/wash with your normal soap after using and rinsing off  the CHG Soap.                9.  Pat yourself dry with a clean towel.            10.  Wear clean pajamas.            11.  Place clean sheets on your bed the night of your first shower and do not  sleep with pets. Day of Surgery : Do not apply any lotions/deodorants the morning of surgery.  Please wear clean clothes to the hospital/surgery center.  FAILURE TO FOLLOW THESE INSTRUCTIONS MAY RESULT IN THE CANCELLATION OF YOUR SURGERY PATIENT SIGNATURE_________________________________  NURSE SIGNATURE__________________________________  ________________________________________________________________________

## 2018-04-17 NOTE — Progress Notes (Signed)
03-01-18 (Epic) EKG  11-02-16 (Epic) ECHO

## 2018-04-18 ENCOUNTER — Encounter (HOSPITAL_COMMUNITY): Payer: Self-pay

## 2018-04-18 ENCOUNTER — Encounter (HOSPITAL_COMMUNITY)
Admission: RE | Admit: 2018-04-18 | Discharge: 2018-04-18 | Disposition: A | Discharge status: Home / Self Care | Payer: Medicare Other | Source: Ambulatory Visit | Attending: Urology | Admitting: Urology

## 2018-04-18 ENCOUNTER — Other Ambulatory Visit: Payer: Self-pay

## 2018-04-18 DIAGNOSIS — D4959 Neoplasm of unspecified behavior of other genitourinary organ: Secondary | ICD-10-CM | POA: Diagnosis not present

## 2018-04-18 DIAGNOSIS — Z01812 Encounter for preprocedural laboratory examination: Secondary | ICD-10-CM | POA: Insufficient documentation

## 2018-04-18 DIAGNOSIS — R310 Gross hematuria: Secondary | ICD-10-CM | POA: Diagnosis not present

## 2018-04-18 HISTORY — DX: Dependence on renal dialysis: Z99.2

## 2018-04-18 LAB — BASIC METABOLIC PANEL
ANION GAP: 14 (ref 5–15)
BUN: 28 mg/dL — AB (ref 8–23)
CALCIUM: 8.1 mg/dL — AB (ref 8.9–10.3)
CO2: 32 mmol/L (ref 22–32)
CREATININE: 3.57 mg/dL — AB (ref 0.61–1.24)
Chloride: 95 mmol/L — ABNORMAL LOW (ref 98–111)
GFR calc Af Amer: 19 mL/min — ABNORMAL LOW (ref >60)
GFR, EST NON AFRICAN AMERICAN: 17 mL/min — AB (ref >60)
GLUCOSE: 170 mg/dL — AB (ref 70–99)
Potassium: 2.9 mmol/L — ABNORMAL LOW (ref 3.5–5.1)
Sodium: 141 mmol/L (ref 135–145)

## 2018-04-18 LAB — HEMOGLOBIN A1C
HEMOGLOBIN A1C: 4.7 % — AB (ref 4.8–5.6)
MEAN PLASMA GLUCOSE: 88.19 mg/dL

## 2018-04-18 LAB — CBC
HCT: 32.7 % — ABNORMAL LOW (ref 39.0–52.0)
HEMOGLOBIN: 10.1 g/dL — AB (ref 13.0–17.0)
MCH: 30.2 pg (ref 26.0–34.0)
MCHC: 30.9 g/dL (ref 30.0–36.0)
MCV: 97.9 fL (ref 78.0–100.0)
Platelets: 225 10*3/uL (ref 150–400)
RBC: 3.34 MIL/uL — ABNORMAL LOW (ref 4.22–5.81)
RDW: 14.8 % (ref 11.5–15.5)
WBC: 7.2 10*3/uL (ref 4.0–10.5)

## 2018-04-18 LAB — GLUCOSE, CAPILLARY: GLUCOSE-CAPILLARY: 157 mg/dL — AB (ref 70–99)

## 2018-04-18 NOTE — Patient Instructions (Signed)
SORIN FRIMPONG  04/18/2018   Your procedure is scheduled on: 04-23-18   Report to [facility] Main  Entrance    Report to Admitting at 5:30  AM    Call this number if you have problems the morning of surgery [phone]   Remember: Do not eat food or drink liquids :After Midnight.     Take these medicines the morning of surgery with A SIP OF WATER: Carvedilol (Coreg) DO NOT TAKE ANY DIABETIC MEDICATIONS DAY OF YOUR SURGERY                               You may not have any metal on your body including hair pins and              piercings  Do not wear jewelry, lotions, powders, cologne or deodorant             Men may shave face and neck.   Do not bring valuables to the hospital. [place].  Contacts, dentures or bridgework may not be worn into surgery.      Patients discharged the day of surgery will not be allowed to drive home.  Name and phone number of your driver: Theoplis Garciagarcia [phone]                Please read over the following fact sheets you were given: _____________________________________________________________________ How to Manage Your Diabetes Before and After Surgery  Why is it important to control my blood sugar before and after surgery? . Improving blood sugar levels before and after surgery helps healing and can limit problems. . A way of improving blood sugar control is eating a healthy diet by: o  Eating less sugar and carbohydrates o  Increasing activity/exercise o  Talking with your doctor about reaching your blood sugar goals . High blood sugars (greater than 180 mg/dL) can raise your risk of infections and slow your recovery, so you will need to focus on controlling your diabetes during the weeks before surgery. . Make sure that the doctor who takes care of your diabetes knows about your planned surgery including the date and location.  How do I manage my blood  sugar before surgery? . Check your blood sugar at least 4 times a day, starting 2 days before surgery, to make sure that the level is not too high or low. o Check your blood sugar the morning of your surgery when you wake up and every 2 hours until you get to the Short Stay unit. . If your blood sugar is less than 70 mg/dL, you will need to treat for low blood sugar: o Do not take insulin. o Treat a low blood sugar (less than 70 mg/dL) with  cup of clear juice (cranberry or apple), 4 glucose tablets, OR glucose gel. o Recheck blood sugar in 15 minutes after treatment (to make sure it is greater than 70 mg/dL). If your blood sugar is not greater than 70 mg/dL on recheck, call [phone] for further instructions. . Report your blood sugar to the short stay nurse when you get to Short Stay.  . If you are admitted to the hospital after surgery: o Your blood sugar will be checked by the staff and  you will probably be given insulin after surgery (instead of oral diabetes medicines) to make sure you have good blood sugar levels. o The goal for blood sugar control after surgery is 80-180 mg/dL.   WHAT DO I DO ABOUT MY DIABETES MEDICATION?  Marland Kitchen Do not take oral diabetes medicines (pills) the morning of surgery.  . THE DAY BEFORE SURGERY, take your usual morning dose of Glipizide (Glucotrol), Tresiba 50 U , and Sliding Scale 2-25 units of Novolog insulin.    . If your CBG is greater than 220 mg/dL, you may take  of your sliding scale  . (correction) dose of insulin.     Reviewed and Endorsed by [facility] Patient Education Committee, August 2015           [facility] - Preparing for Surgery Before surgery, you can play an important role.  Because skin is not sterile, your skin needs to be as free of germs as possible.  You can reduce the number of germs on your skin by washing with CHG (chlorahexidine gluconate) soap before surgery.  CHG is an antiseptic cleaner which kills germs and bonds with  the skin to continue killing germs even after washing. Please DO NOT use if you have an allergy to CHG or antibacterial soaps.  If your skin becomes reddened/irritated stop using the CHG and inform your nurse when you arrive at Short Stay. Do not shave (including legs and underarms) for at least 48 hours prior to the first CHG shower.  You may shave your face/neck. Please follow these instructions carefully:  1.  Shower with CHG Soap the night before surgery and the  morning of Surgery.  2.  If you choose to wash your hair, wash your hair first as usual with your  normal  shampoo.  3.  After you shampoo, rinse your hair and body thoroughly to remove the  shampoo.                           4.  Use CHG as you would any other liquid soap.  You can apply chg directly  to the skin and wash                       Gently with a scrungie or clean washcloth.  5.  Apply the CHG Soap to your body ONLY FROM THE NECK DOWN.   Do not use on face/ open                           Wound or open sores. Avoid contact with eyes, ears mouth and genitals (private parts).                       Wash face,  Genitals (private parts) with your normal soap.             6.  Wash thoroughly, paying special attention to the area where your surgery  will be performed.  7.  Thoroughly rinse your body with warm water from the neck down.  8.  DO NOT shower/wash with your normal soap after using and rinsing off  the CHG Soap.                9.  Pat yourself dry with a clean towel.            10.  Wear clean  pajamas.            11.  Place clean sheets on your bed the night of your first shower and do not  sleep with pets. Day of Surgery : Do not apply any lotions/deodorants the morning of surgery.  Please wear clean clothes to the hospital/surgery center.  FAILURE TO FOLLOW THESE INSTRUCTIONS MAY RESULT IN THE CANCELLATION OF YOUR SURGERY PATIENT SIGNATURE_________________________________  NURSE  SIGNATURE__________________________________  ________________________________________________________________________

## 2018-04-19 ENCOUNTER — Encounter (HOSPITAL_COMMUNITY): Payer: Self-pay

## 2018-04-19 DIAGNOSIS — N186 End stage renal disease: Secondary | ICD-10-CM | POA: Diagnosis not present

## 2018-04-19 DIAGNOSIS — D6959 Other secondary thrombocytopenia: Secondary | ICD-10-CM | POA: Diagnosis not present

## 2018-04-19 DIAGNOSIS — E8809 Other disorders of plasma-protein metabolism, not elsewhere classified: Secondary | ICD-10-CM | POA: Diagnosis not present

## 2018-04-19 DIAGNOSIS — N2581 Secondary hyperparathyroidism of renal origin: Secondary | ICD-10-CM | POA: Diagnosis not present

## 2018-04-19 DIAGNOSIS — D500 Iron deficiency anemia secondary to blood loss (chronic): Secondary | ICD-10-CM | POA: Diagnosis not present

## 2018-04-19 DIAGNOSIS — D631 Anemia in chronic kidney disease: Secondary | ICD-10-CM | POA: Diagnosis not present

## 2018-04-19 NOTE — Progress Notes (Signed)
Dr Jillyn Hidden (anesthesia) aware of ekg done 04/16/2018 from cardiology and Office visit note from cardiology done 04/16/2018.  No new orders given.

## 2018-04-19 NOTE — Progress Notes (Signed)
OV note from 04/16/2018 on chart. From DR Cleora Fleet.

## 2018-04-20 DIAGNOSIS — N186 End stage renal disease: Secondary | ICD-10-CM | POA: Diagnosis not present

## 2018-04-20 DIAGNOSIS — Z992 Dependence on renal dialysis: Secondary | ICD-10-CM | POA: Diagnosis not present

## 2018-04-22 DIAGNOSIS — D500 Iron deficiency anemia secondary to blood loss (chronic): Secondary | ICD-10-CM | POA: Diagnosis not present

## 2018-04-22 DIAGNOSIS — Z23 Encounter for immunization: Secondary | ICD-10-CM | POA: Diagnosis not present

## 2018-04-22 DIAGNOSIS — D6959 Other secondary thrombocytopenia: Secondary | ICD-10-CM | POA: Diagnosis not present

## 2018-04-22 DIAGNOSIS — N2581 Secondary hyperparathyroidism of renal origin: Secondary | ICD-10-CM | POA: Diagnosis not present

## 2018-04-22 DIAGNOSIS — N186 End stage renal disease: Secondary | ICD-10-CM | POA: Diagnosis not present

## 2018-04-22 DIAGNOSIS — D631 Anemia in chronic kidney disease: Secondary | ICD-10-CM | POA: Diagnosis not present

## 2018-04-22 MED ORDER — DEXTROSE 5 % IV SOLN
3.0000 g | Freq: Once | INTRAVENOUS | Status: AC
  Administered 2018-04-23: 3 g via INTRAVENOUS
  Filled 2018-04-22: qty 3

## 2018-04-23 ENCOUNTER — Ambulatory Visit (HOSPITAL_COMMUNITY): Payer: Medicare Other | Admitting: Certified Registered Nurse Anesthetist

## 2018-04-23 ENCOUNTER — Encounter (HOSPITAL_COMMUNITY)
Admission: RE | Disposition: A | Discharge status: Home / Self Care | Payer: Self-pay | Source: Ambulatory Visit | Attending: Urology

## 2018-04-23 ENCOUNTER — Ambulatory Visit (HOSPITAL_COMMUNITY): Payer: Medicare Other

## 2018-04-23 ENCOUNTER — Ambulatory Visit (HOSPITAL_COMMUNITY)
Admission: RE | Admit: 2018-04-23 | Discharge: 2018-04-23 | Disposition: A | Discharge status: Home / Self Care | Payer: Medicare Other | Source: Ambulatory Visit | Attending: Urology | Admitting: Urology

## 2018-04-23 ENCOUNTER — Encounter (HOSPITAL_COMMUNITY): Payer: Self-pay | Admitting: *Deleted

## 2018-04-23 DIAGNOSIS — R310 Gross hematuria: Secondary | ICD-10-CM | POA: Insufficient documentation

## 2018-04-23 DIAGNOSIS — N059 Unspecified nephritic syndrome with unspecified morphologic changes: Secondary | ICD-10-CM | POA: Diagnosis not present

## 2018-04-23 DIAGNOSIS — L4050 Arthropathic psoriasis, unspecified: Secondary | ICD-10-CM | POA: Insufficient documentation

## 2018-04-23 DIAGNOSIS — Z955 Presence of coronary angioplasty implant and graft: Secondary | ICD-10-CM | POA: Diagnosis not present

## 2018-04-23 DIAGNOSIS — Z7982 Long term (current) use of aspirin: Secondary | ICD-10-CM | POA: Diagnosis not present

## 2018-04-23 DIAGNOSIS — M479 Spondylosis, unspecified: Secondary | ICD-10-CM | POA: Diagnosis not present

## 2018-04-23 DIAGNOSIS — Z794 Long term (current) use of insulin: Secondary | ICD-10-CM | POA: Insufficient documentation

## 2018-04-23 DIAGNOSIS — N500 Atrophy of testis: Secondary | ICD-10-CM | POA: Insufficient documentation

## 2018-04-23 DIAGNOSIS — I252 Old myocardial infarction: Secondary | ICD-10-CM | POA: Insufficient documentation

## 2018-04-23 DIAGNOSIS — Z9981 Dependence on supplemental oxygen: Secondary | ICD-10-CM | POA: Diagnosis not present

## 2018-04-23 DIAGNOSIS — E785 Hyperlipidemia, unspecified: Secondary | ICD-10-CM | POA: Diagnosis not present

## 2018-04-23 DIAGNOSIS — D4112 Neoplasm of uncertain behavior of left renal pelvis: Secondary | ICD-10-CM | POA: Diagnosis not present

## 2018-04-23 DIAGNOSIS — I781 Nevus, non-neoplastic: Secondary | ICD-10-CM | POA: Insufficient documentation

## 2018-04-23 DIAGNOSIS — J45909 Unspecified asthma, uncomplicated: Secondary | ICD-10-CM | POA: Diagnosis not present

## 2018-04-23 DIAGNOSIS — D4102 Neoplasm of uncertain behavior of left kidney: Secondary | ICD-10-CM | POA: Diagnosis not present

## 2018-04-23 DIAGNOSIS — N3289 Other specified disorders of bladder: Secondary | ICD-10-CM | POA: Diagnosis not present

## 2018-04-23 DIAGNOSIS — R896 Abnormal cytological findings in specimens from other organs, systems and tissues: Secondary | ICD-10-CM | POA: Diagnosis not present

## 2018-04-23 DIAGNOSIS — N281 Cyst of kidney, acquired: Secondary | ICD-10-CM | POA: Insufficient documentation

## 2018-04-23 DIAGNOSIS — I2510 Atherosclerotic heart disease of native coronary artery without angina pectoris: Secondary | ICD-10-CM | POA: Insufficient documentation

## 2018-04-23 DIAGNOSIS — Z6841 Body Mass Index (BMI) 40.0 and over, adult: Secondary | ICD-10-CM | POA: Insufficient documentation

## 2018-04-23 DIAGNOSIS — Z9989 Dependence on other enabling machines and devices: Secondary | ICD-10-CM | POA: Diagnosis not present

## 2018-04-23 DIAGNOSIS — M170 Bilateral primary osteoarthritis of knee: Secondary | ICD-10-CM | POA: Diagnosis not present

## 2018-04-23 DIAGNOSIS — Z79899 Other long term (current) drug therapy: Secondary | ICD-10-CM | POA: Diagnosis not present

## 2018-04-23 DIAGNOSIS — G4733 Obstructive sleep apnea (adult) (pediatric): Secondary | ICD-10-CM | POA: Diagnosis not present

## 2018-04-23 DIAGNOSIS — E119 Type 2 diabetes mellitus without complications: Secondary | ICD-10-CM | POA: Insufficient documentation

## 2018-04-23 HISTORY — PX: CYSTOSCOPY WITH URETEROSCOPY AND STENT PLACEMENT: SHX6377

## 2018-04-23 LAB — BASIC METABOLIC PANEL
Anion gap: 14 (ref 5–15)
BUN: 34 mg/dL — AB (ref 8–23)
CO2: 33 mmol/L — ABNORMAL HIGH (ref 22–32)
CREATININE: 3.89 mg/dL — AB (ref 0.61–1.24)
Calcium: 8 mg/dL — ABNORMAL LOW (ref 8.9–10.3)
Chloride: 96 mmol/L — ABNORMAL LOW (ref 98–111)
GFR calc Af Amer: 17 mL/min — ABNORMAL LOW (ref >60)
GFR, EST NON AFRICAN AMERICAN: 15 mL/min — AB (ref >60)
Glucose, Bld: 93 mg/dL (ref 70–99)
Potassium: 3.5 mmol/L (ref 3.5–5.1)
SODIUM: 143 mmol/L (ref 135–145)

## 2018-04-23 LAB — GLUCOSE, CAPILLARY
GLUCOSE-CAPILLARY: 83 mg/dL (ref 70–99)
Glucose-Capillary: 85 mg/dL (ref 70–99)

## 2018-04-23 SURGERY — CYSTOURETEROSCOPY, WITH STENT INSERTION
Anesthesia: General | Site: Urethra | Laterality: Bilateral

## 2018-04-23 MED ORDER — LIDOCAINE 2% (20 MG/ML) 5 ML SYRINGE
INTRAMUSCULAR | Status: DC | PRN
  Administered 2018-04-23: 80 mg via INTRAVENOUS

## 2018-04-23 MED ORDER — SODIUM CHLORIDE 0.9 % IR SOLN
Status: DC | PRN
  Administered 2018-04-23: 6000 mL

## 2018-04-23 MED ORDER — IOPAMIDOL (ISOVUE-300) INJECTION 61%
INTRAVENOUS | Status: DC | PRN
  Administered 2018-04-23: 22 mL via INTRAVENOUS

## 2018-04-23 MED ORDER — ONDANSETRON HCL 4 MG/2ML IJ SOLN
INTRAMUSCULAR | Status: AC
  Filled 2018-04-23: qty 6

## 2018-04-23 MED ORDER — SODIUM CHLORIDE 0.9 % IR SOLN
Status: DC | PRN
  Administered 2018-04-23: 1000 mL

## 2018-04-23 MED ORDER — PHENYLEPHRINE 40 MCG/ML (10ML) SYRINGE FOR IV PUSH (FOR BLOOD PRESSURE SUPPORT)
PREFILLED_SYRINGE | INTRAVENOUS | Status: AC
  Filled 2018-04-23: qty 10

## 2018-04-23 MED ORDER — DEXAMETHASONE SODIUM PHOSPHATE 10 MG/ML IJ SOLN
INTRAMUSCULAR | Status: AC
  Filled 2018-04-23: qty 3

## 2018-04-23 MED ORDER — LIDOCAINE HCL URETHRAL/MUCOSAL 2 % EX GEL
CUTANEOUS | Status: AC
  Filled 2018-04-23: qty 5

## 2018-04-23 MED ORDER — PROMETHAZINE HCL 25 MG/ML IJ SOLN: 6.2500 mg | INTRAMUSCULAR | Status: DC | PRN

## 2018-04-23 MED ORDER — PROPOFOL 10 MG/ML IV BOLUS
INTRAVENOUS | Status: DC | PRN
  Administered 2018-04-23: 200 mg via INTRAVENOUS

## 2018-04-23 MED ORDER — LACTATED RINGERS IV SOLN
INTRAVENOUS | Status: DC
  Administered 2018-04-23: 06:00:00 via INTRAVENOUS

## 2018-04-23 MED ORDER — FENTANYL CITRATE (PF) 100 MCG/2ML IJ SOLN: 25.0000 ug | INTRAMUSCULAR | Status: DC | PRN

## 2018-04-23 MED ORDER — STERILE WATER FOR IRRIGATION IR SOLN
Status: DC | PRN
  Administered 2018-04-23: 3000 mL

## 2018-04-23 MED ORDER — FENTANYL CITRATE (PF) 100 MCG/2ML IJ SOLN
INTRAMUSCULAR | Status: AC
  Filled 2018-04-23: qty 2

## 2018-04-23 MED ORDER — MIDAZOLAM HCL 5 MG/5ML IJ SOLN
INTRAMUSCULAR | Status: DC | PRN
  Administered 2018-04-23: 2 mg via INTRAVENOUS

## 2018-04-23 MED ORDER — PHENYLEPHRINE HCL 10 MG/ML IJ SOLN
INTRAMUSCULAR | Status: AC
  Filled 2018-04-23: qty 2

## 2018-04-23 MED ORDER — FENTANYL CITRATE (PF) 100 MCG/2ML IJ SOLN
INTRAMUSCULAR | Status: DC | PRN
  Administered 2018-04-23: 25 ug via INTRAVENOUS

## 2018-04-23 MED ORDER — PHENYLEPHRINE 40 MCG/ML (10ML) SYRINGE FOR IV PUSH (FOR BLOOD PRESSURE SUPPORT)
PREFILLED_SYRINGE | INTRAVENOUS | Status: DC | PRN
  Administered 2018-04-23 (×3): 80 ug via INTRAVENOUS
  Administered 2018-04-23: 120 ug via INTRAVENOUS
  Administered 2018-04-23 (×2): 80 ug via INTRAVENOUS

## 2018-04-23 MED ORDER — ONDANSETRON HCL 4 MG/2ML IJ SOLN
INTRAMUSCULAR | Status: DC | PRN
  Administered 2018-04-23: 4 mg via INTRAVENOUS

## 2018-04-23 MED ORDER — LIDOCAINE 2% (20 MG/ML) 5 ML SYRINGE
INTRAMUSCULAR | Status: AC
  Filled 2018-04-23: qty 30

## 2018-04-23 MED ORDER — MIDAZOLAM HCL 2 MG/2ML IJ SOLN
INTRAMUSCULAR | Status: AC
  Filled 2018-04-23: qty 2

## 2018-04-23 MED ORDER — PROPOFOL 10 MG/ML IV BOLUS
INTRAVENOUS | Status: AC
  Filled 2018-04-23: qty 40

## 2018-04-23 MED ORDER — DEXAMETHASONE SODIUM PHOSPHATE 10 MG/ML IJ SOLN
INTRAMUSCULAR | Status: DC | PRN
  Administered 2018-04-23: 10 mg via INTRAVENOUS

## 2018-04-23 SURGICAL SUPPLY — 22 items
BAG URO CATCHER STRL LF (MISCELLANEOUS) ×3 IMPLANT
BASKET ZERO TIP 1.9FR (BASKET) ×3 IMPLANT
BASKET ZERO TIP NITINOL 2.4FR (BASKET) ×3 IMPLANT
BRUSH URET BIOPSY 3F (UROLOGICAL SUPPLIES) ×3 IMPLANT
CATH INTERMIT  6FR 70CM (CATHETERS) ×3 IMPLANT
CATH URET 5FR 28IN CONE TIP (BALLOONS) ×4
CATH URET 5FR 28IN OPEN ENDED (CATHETERS) ×3 IMPLANT
CATH URET 5FR 70CM CONE TIP (BALLOONS) ×2 IMPLANT
CATH URET WHISTLE 6FR (CATHETERS) ×3 IMPLANT
CLOTH BEACON ORANGE TIMEOUT ST (SAFETY) ×3 IMPLANT
GLOVE BIO SURGEON STRL SZ7.5 (GLOVE) ×3 IMPLANT
GOWN STRL REUS W/TWL XL LVL3 (GOWN DISPOSABLE) ×3 IMPLANT
GUIDEWIRE ANG ZIPWIRE 035X150 (WIRE) ×3 IMPLANT
GUIDEWIRE STR DUAL SENSOR (WIRE) ×3 IMPLANT
MANIFOLD NEPTUNE II (INSTRUMENTS) ×3 IMPLANT
PACK CYSTO (CUSTOM PROCEDURE TRAY) ×3 IMPLANT
SHEATH URETERAL 12FRX28CM (UROLOGICAL SUPPLIES) IMPLANT
SHEATH URETERAL 12FRX35CM (MISCELLANEOUS) ×3 IMPLANT
STENT URET 6FRX24 CONTOUR (STENTS) ×3 IMPLANT
TUBING CONNECTING 10 (TUBING) ×2 IMPLANT
TUBING CONNECTING 10' (TUBING) ×1
WIRE COONS/BENSON .038X145CM (WIRE) ×3 IMPLANT

## 2018-04-23 NOTE — Anesthesia Procedure Notes (Signed)
Procedure Name: LMA Insertion Date/Time: 04/23/2018 7:53 AM Performed by: Mitzie Na, CRNA Pre-anesthesia Checklist: Patient identified, Emergency Drugs available, Suction available, Patient being monitored and Timeout performed Patient Re-evaluated:Patient Re-evaluated prior to induction Oxygen Delivery Method: Circle system utilized Preoxygenation: Pre-oxygenation with 100% oxygen Induction Type: IV induction LMA: LMA inserted LMA Size: 4.0 Number of attempts: 1 Dental Injury: Teeth and Oropharynx as per pre-operative assessment

## 2018-04-23 NOTE — Progress Notes (Signed)
Dr Kalman Shan called RN: PT may go home if K greater than 2.9.

## 2018-04-23 NOTE — Transfer of Care (Signed)
Immediate Anesthesia Transfer of Care Note  Patient: Kent Osborne  Procedure(s) Performed: CYSTOSCOPY WITH BLADDER  BIOPSY BILATERAL RETROGRADE PYELOGRAM LEFT URETEROSCOPY WITH BIOPSY AND LEFT URETERAL  STENT PLACEMENT (Bilateral Urethra)  Patient Location: PACU  Anesthesia Type:General  Level of Consciousness: awake, alert , oriented and patient cooperative  Airway & Oxygen Therapy: Patient Spontanous Breathing and Patient connected to face mask oxygen  Post-op Assessment: Report given to RN, Post -op Vital signs reviewed and stable and Patient moving all extremities  Post vital signs: Reviewed and stable  Last Vitals:  Vitals Value Taken Time  BP 153/69 04/23/2018  9:26 AM  Temp    Pulse 67 04/23/2018  9:29 AM  Resp 27 04/23/2018  9:29 AM  SpO2 100 % 04/23/2018  9:29 AM  Vitals shown include unvalidated device data.  Last Pain:  Vitals:   04/23/18 0608  TempSrc:   PainSc: 0-No pain         Complications: No apparent anesthesia complications

## 2018-04-23 NOTE — Op Note (Signed)
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  CT scan revealed possible renal tumor and left renal lower pole collecting system mass.  Follow-up MRI revealed filling defect in the left lower pole collecting system and Bosniak 2 left renal cyst.  He was brought today for above procedures.  Findings: On exam under anesthesia the penis was uncircumcised but no phimosis mass or lesion.  The penis was palpably normal.  The testicles were descended and palpably normal.  Both of the testicles were atrophic.  On digital rectal exam the prostate was 30 g and smooth without hard area or nodule.  On cystoscopy the urethra and prostatic urethra were unremarkable.  The bladder contain no stone or foreign body.  The ureteral orifice ease were in the normal orthotopic position.  There was some posterior erythema which was biopsied but no obvious tumor and no papillary tumor.  Right retrograde pyelogram-this outlined a single ureter single collecting system unit without filling defect, stricture or dilation.  Left retrograde pyelogram-this outlined a single ureter single collecting system unit with a 3 to 4 cm left lower pole filling defect that filled the calyces and the lower pole infundibulum and extended up toward the renal pelvis.  On ureteroscopy this was confirmed to be a neoplastic growth filling the left lower pole calyx and infundibulum that appeared papillary in nature although it was  smooth.  More posterior there was a small amount of clot.  Description of procedure: After consent was obtained patient brought to the operating room.  After adequate anesthesia was placed in lithotomy position and prepped and draped in the usual sterile fashion.  A timeout was performed to confirm the patient and procedure.  The cystoscope was passed per urethra and the bladder inspected with a 30 degree and 70 degree lens.  The camera did not take pictures and had to be switched out.  I biopsied the posterior inferior and posterior superior bladder of some mild areas of erythema.  These were fulgurated with the Bugbee electrode.  The right ureteral orifice was then cannulated with a 5 [place] open-ended catheter and retrograde injection of contrast was performed.  The left ureteral orifice was then cannulated with the 5 [place] open-ended catheter and retrograde injection of contrast was performed.  I attempted to do a no touch left ureteroscopy but the first scope was open and and mislabeled as a 4 [place] but was a 6.5 [place] dual channel that was too large to pass through the ureteral orifice.  We did find to 4 [place] single channels neither of those would run irrigation and it was thought they were occluded by the enzymatic cleaner.  Therefore I repassed the cystoscope and passed a Glidewire up the ureter and then used a medium access sheath to get 2 wires in place.  I then went adjacent to the [place] leaving it as the safety and advanced the access sheath without difficulty into the proximal ureter.  A dual channel digital ureteroscope was advanced and a large left lower pole neoplasm was noted.  I took cytology from the renal pelvis  by washing it with saline and then evacuating it with a syringe.  I then took a 0 tip basket and took 3 samples of the tumor off the top.  It was oozing simply from the ureteroscopy and only mild bleeding.  The scope was backed out along with the sheath and the ureter was  noted to be normal-appearing.  The Glidewire was backloaded on the cystoscope and a 6 x 24 cm stent was advanced.  The wire was removed with a good coil seen up in the upper pole collecting system and a good coil in the bladder.  He was awakened taken to recovery room in stable condition.  Complications: None  Blood loss: Minimal  Specimens to pathology: #1 left posterior inferior bladder biopsy #2 left posterior superior bladder biopsy #3 left renal pelvic washing for cytology #4 left lower pole tumor biopsy  Drains: 6 x 24 cm left ureteral stent  Disposition: Patient stable to PACU

## 2018-04-23 NOTE — Anesthesia Preprocedure Evaluation (Signed)
Anesthesia Evaluation  Patient identified by MRN, date of birth, ID band Patient awake    Reviewed: Allergy & Precautions, NPO status , Patient's Chart, lab work & pertinent test results  Airway Mallampati: II  TM Distance: <3 FB Neck ROM: Full    Dental no notable dental hx.    Pulmonary neg pulmonary ROS, sleep apnea, Continuous Positive Airway Pressure Ventilation and Oxygen sleep apnea ,    breath sounds clear to auscultation + decreased breath sounds      Cardiovascular hypertension, + CAD, + Past MI and + Cardiac Stents  Normal cardiovascular exam Rhythm:Regular Rate:Normal     Neuro/Psych negative neurological ROS  negative psych ROS   GI/Hepatic negative GI ROS, Neg liver ROS,   Endo/Other  diabetesMorbid obesity  Renal/GU Renal InsufficiencyRenal disease  negative genitourinary   Musculoskeletal negative musculoskeletal ROS (+)   Abdominal   Peds negative pediatric ROS (+)  Hematology negative hematology ROS (+)   Anesthesia Other Findings   Reproductive/Obstetrics negative OB ROS                             Anesthesia Physical Anesthesia Plan  ASA: IV  Anesthesia Plan: General   Post-op Pain Management:    Induction: Intravenous  PONV Risk Score and Plan: 2 and Ondansetron and Treatment may vary due to age or medical condition  Airway Management Planned: Oral ETT and LMA  Additional Equipment:   Intra-op Plan:   Post-operative Plan: Extubation in OR  Informed Consent: I have reviewed the patients History and Physical, chart, labs and discussed the procedure including the risks, benefits and alternatives for the proposed anesthesia with the patient or authorized representative who has indicated his/her understanding and acceptance.   Dental advisory given  Plan Discussed with: CRNA and Surgeon  Anesthesia Plan Comments:         Anesthesia Quick  Evaluation

## 2018-04-23 NOTE — Anesthesia Postprocedure Evaluation (Signed)
Anesthesia Post Note  Patient: Kent Osborne  Procedure(s) Performed: CYSTOSCOPY WITH BLADDER  BIOPSY BILATERAL RETROGRADE PYELOGRAM LEFT URETEROSCOPY WITH BIOPSY AND LEFT URETERAL  STENT PLACEMENT (Bilateral Urethra)     Patient location during evaluation: PACU Anesthesia Type: General Level of consciousness: awake and alert Pain management: pain level controlled Vital Signs Assessment: post-procedure vital signs reviewed and stable Respiratory status: spontaneous breathing, nonlabored ventilation, respiratory function stable and patient connected to nasal cannula oxygen Cardiovascular status: blood pressure returned to baseline and stable Postop Assessment: no apparent nausea or vomiting Anesthetic complications: no    Last Vitals:  Vitals:   04/23/18 1030 04/23/18 1040  BP: (!) 131/43   Pulse: 68 (P) 69  Resp: 12 (P) 14  Temp:    SpO2: 92% (P) 99%    Last Pain:  Vitals:   04/23/18 1030  TempSrc:   PainSc: 0-No pain                 Seanna Sisler S

## 2018-04-23 NOTE — H&P (Signed)
H&P  Chief Complaint: Gross hematuria, left renal neoplasm versus clot  History of Present Illness: 67 year old white male who presented with gross hematuria.  He has a history of end-stage renal disease and now on hemodialysis.  CT scanning revealed a complex cystic mass of the left lower pole and possible tumor or clot in the left lower pole collecting system.  Follow-up MRI scan revealed a 5.5 and 1.2 cm cyst within the left kidney that appeared to be more hemorrhagic or layering protein consistent with Bosniak 2 cyst.  There continued to be tumor versus clot in the left lower pole collecting system.  He presents today for endoscopic evaluation with retrogrades.  He is now on hemodialysis and he voids about once per day.  Urine has been clear yellow.  He has had no dysuria or fever.  Past Medical History:  Diagnosis Date  . Acute renal failure superimposed on chronic kidney disease ([place])    [name] 11/01/2016  . Anemia   . Arthritis    "back, knees" (11/01/2016)  . Asthma   . CAD (coronary artery disease) 12/31/07   [facility] stents- placed in the circumflex and LAD  . Chronic bronchitis ([place])   . Hemodialysis status ([place])   . History of blood transfusion 10/30/2016   "related to anemia"  . History of gout   . History of kidney stones   . Hyperlipidemia   . Hypertension   . NSTEMI (non-ST elevated myocardial infarction) ([place]) 10/30/2016  . OA (osteoarthritis)   . Obese   . On home oxygen therapy    "2L w/CPAP" (11/01/2016)  . OSA on CPAP   . Psoriatic arthritis ([place])   . Type II diabetes mellitus ([place])    Past Surgical History:  Procedure Laterality Date  . AV FISTULA PLACEMENT    . CORONARY ANGIOPLASTY WITH STENT PLACEMENT  2009   "3 stents"    Home Medications:  Medications Prior to Admission  Medication Sig Dispense Refill Last Dose  . amitriptyline (ELAVIL) 25 MG tablet Take 25 mg by mouth at bedtime.    04/22/2018 at Unknown time  . aspirin EC 81 MG tablet Take  81 mg by mouth daily.   Past Week at Unknown time  . calcium acetate (PHOSLO) 667 MG capsule Take 1 capsule (667 mg total) by mouth 3 (three) times daily with meals. 90 capsule 0 04/22/2018 at Unknown time  . carvedilol (COREG) 6.25 MG tablet TAKE 1 TABLET BY MOUTH TWICE DAILY WITH  MEALS (Patient taking differently: Take 6.25 mg by mouth 2 (two) times daily with a meal. ) 180 tablet 3 04/23/2018 at 0400  . Cholecalciferol (VITAMIN D) 2000 units tablet Take 2,000 Units by mouth daily.   04/22/2018 at Unknown time  . glipiZIDE (GLUCOTROL) 10 MG tablet TAKE 1 TABLET BY MOUTH ONCE DAILY BEFORE BREAKFAST (Patient taking differently: Take 10 mg by mouth daily before breakfast. ) 90 tablet 1 04/22/2018 at Unknown time  . hydrALAZINE (APRESOLINE) 50 MG tablet Take 1 tablet (50 mg total) by mouth 3 (three) times daily. 90 tablet 2 04/22/2018 at Unknown time  . isosorbide mononitrate (IMDUR) 60 MG 24 hr tablet TAKE 1 TABLET BY MOUTH ONCE DAILY (Patient taking differently: Take 60 mg by mouth daily. ) 90 tablet 2 04/22/2018 at Unknown time  . montelukast (SINGULAIR) 10 MG tablet Take 10 mg by mouth at bedtime.    04/22/2018 at Unknown time  . nitroGLYCERIN (NITROSTAT) 0.4 MG SL tablet Place 1 tablet (0.4 mg  total) under the tongue every 5 (five) minutes as needed for chest pain. 30 tablet 12 unknown  . NOVOLOG FLEXPEN 100 UNIT/ML FlexPen  INJECT 25 UNITS INTO THE SKIN THREE TIMES DAILY USING A SLIDING SCALE (Patient taking differently: Inject 2-25 Units into the skin 3 (three) times daily with meals. Per sliding scale) 100 mL 3 Past Week at Unknown time  . Oxycodone HCl 10 MG TABS Take 10 mg by mouth 4 (four) times daily as needed (for pain).    04/23/2018 at 0400  . simvastatin (ZOCOR) 40 MG tablet TAKE 1 TABLET BY MOUTH EVERY DAY AT BEDTIME (Patient taking differently: Take 40 mg by mouth at bedtime. ) 90 tablet 3 04/22/2018 at Unknown time  . tamsulosin (FLOMAX) 0.4 MG CAPS capsule Take 1 capsule (0.4 mg total) by mouth daily.  30 capsule 0 04/22/2018 at Unknown time  . torsemide (DEMADEX) 20 MG tablet Take 1 tablet (20 mg total) by mouth daily. Please discuss with your nephrologist Dr Marlowe Sax regarding further dosing adjustment of this meds. Please discuss with Dr Marlowe Sax regarding timing of starting on dialysis. 10 tablet 0 Past Week at Unknown time  . TRESIBA FLEXTOUCH 100 UNIT/ML SOPN FlexTouch Pen INJECT 50 UNITS SUBCUTANEOUSLY ONCE DAILY AT  10  PM (Patient taking differently: Inject 50 Units into the skin daily. ) 15 mL 2 04/22/2018 at Unknown time  . glucose blood (ONE TOUCH ULTRA TEST) test strip USE ONE STRIP TO CHECK GLUCOSE 4 TIMES DAILY (Patient not taking: Reported on 04/11/2018) 400 each 0 Not Taking at Unknown time   Allergies: No Known Allergies  Family History  Problem Relation Age of Onset  . Diabetes Other   . Hyperlipidemia Other   . Hypertension Other    Social History:  reports that he has never smoked. He has never used smokeless tobacco. He reports that he drank alcohol. He reports that he does not use drugs.  ROS: A complete review of systems was performed.  All systems are negative except for pertinent findings as noted. Review of Systems  All other systems reviewed and are negative.    Physical Exam:  Vital signs in last 24 hours: Temp:  [98.6 F (37 C)] 98.6 F (37 C) (09/03 0544) Pulse Rate:  [68] 68 (09/03 0544) Resp:  [16] 16 (09/03 0544) BP: (126)/(56) 126/56 (09/03 0544) SpO2:  [92 %] 92 % (09/03 0544) Weight:  [125.8 kg] 125.8 kg (09/03 2836) General:  Alert and oriented, No acute distress HEENT: Normocephalic, atraumatic Cardiovascular: Regular rate and rhythm Lungs: Regular rate and effort Abdomen: Soft, nontender, nondistended, no abdominal masses Back: No CVA tenderness Extremities: No edema Neurologic: Grossly intact  Laboratory Data:  Results for orders placed or performed during the hospital encounter of 04/23/18 (from the past 24 hour(s))  Glucose, capillary      Status: None   Collection Time: 04/23/18  5:39 AM  Result Value Ref Range   Glucose-Capillary 85 70 - 99 mg/dL   Comment 1 Notify RN    Comment 2 Document in Chart    No results found for this or any previous visit (from the past 240 hour(s)). Creatinine: Recent Labs    04/18/18 1346  CREATININE 3.57*    Impression/Assessment:  Gross hematuria, clot versus tumor in left collecting system, Bosniak 2 left renal cyst-  Plan:  I discussed with the patient and his daughter the nature, potential benefits, risks and alternatives to cystoscopy, bilateral RGP, left URS/biopsy/stent, including side effects of the proposed  treatment, the likelihood of the patient achieving the goals of the procedure, and any potential problems that might occur during the procedure or recuperation. We discussed he may need a pre-stent/staged procedure. All questions answered. Patient elects to proceed.    Festus Aloe 04/23/2018, 7:27 AM

## 2018-04-23 NOTE — Discharge Instructions (Signed)
Ureteral Stent Implantation, Care After Refer to this sheet in the next few weeks. These instructions provide you with information about caring for yourself after your procedure. Your health care provider may also give you more specific instructions. Your treatment has been planned according to current medical practices, but problems sometimes occur. Call your health care provider if you have any problems or questions after your procedure. What can I expect after the procedure? After the procedure, it is common to have:  Nausea.  Mild pain when you urinate. You may feel this pain in your lower back or lower abdomen. Pain should stop within a few minutes after you urinate. This may last for up to 1 week.  A small amount of blood in your urine for several days.  Follow these instructions at home:  Medicines  Take over-the-counter and prescription medicines only as told by your health care provider.  If you were prescribed an antibiotic medicine, take it as told by your health care provider. Do not stop taking the antibiotic even if you start to feel better.  Do not drive for 24 hours if you received a sedative.  Do not drive or operate heavy machinery while taking prescription pain medicines. Activity  Return to your normal activities as told by your health care provider. Ask your health care provider what activities are safe for you.  Do not lift anything that is heavier than 10 lb (4.5 kg). Follow this limit for 1 week after your procedure, or for as long as told by your health care provider. General instructions  Watch for any blood in your urine. Call your health care provider if the amount of blood in your urine increases.  If you have a catheter: ? Follow instructions from your health care provider about taking care of your catheter and collection bag. ? Do not take baths, swim, or use a hot tub until your health care provider approves.  Drink enough fluid to keep your urine  clear or pale yellow.  Keep all follow-up visits as told by your health care provider. This is important. Contact a health care provider if:  You have pain that gets worse or does not get better with medicine, especially pain when you urinate.  You have difficulty urinating.  You feel nauseous or you vomit repeatedly during a period of more than 2 days after the procedure. Get help right away if:  Your urine is dark red or has blood clots in it.  You are leaking urine (have incontinence).  The end of the stent comes out of your urethra.  You cannot urinate.  You have sudden, sharp, or severe pain in your abdomen or lower back.  You have a fever. This information is not intended to replace advice given to you by your health care provider. Make sure you discuss any questions you have with your health care provider. Document Released: 04/09/2013 Document Revised: 01/13/2016 Document Reviewed: 02/19/2015 Elsevier Interactive Patient Education  2018 [employer].  Cystoscopy, Care After Refer to this sheet in the next few weeks. These instructions provide you with information about caring for yourself after your procedure. Your health care provider may also give you more specific instructions. Your treatment has been planned according to current medical practices, but problems sometimes occur. Call your health care provider if you have any problems or questions after your procedure. What can I expect after the procedure? After the procedure, it is common to have:  Mild pain when you urinate. Pain  should stop within a few minutes after you urinate. This may last for up to 1 week.  A small amount of blood in your urine for several days.  Feeling like you need to urinate but producing only a small amount of urine.  Follow these instructions at home:  Medicines  Take over-the-counter and prescription medicines only as told by your health care provider.  If you were prescribed an  antibiotic medicine, take it as told by your health care provider. Do not stop taking the antibiotic even if you start to feel better. General instructions   Return to your normal activities as told by your health care provider. Ask your health care provider what activities are safe for you.  Do not drive for 24 hours if you received a sedative.  Watch for any blood in your urine. If the amount of blood in your urine increases, call your health care provider.  Follow instructions from your health care provider about eating or drinking restrictions.  If a tissue sample was removed for testing (biopsy) during your procedure, it is your responsibility to get your test results. Ask your health care provider or the department performing the test when your results will be ready.  Drink enough fluid to keep your urine clear or pale yellow.  Keep all follow-up visits as told by your health care provider. This is important. Contact a health care provider if:  You have pain that gets worse or does not get better with medicine, especially pain when you urinate.  You have difficulty urinating. Get help right away if:  You have more blood in your urine.  You have blood clots in your urine.  You have abdominal pain.  You have a fever or chills.  You are unable to urinate. This information is not intended to replace advice given to you by your health care provider. Make sure you discuss any questions you have with your health care provider. Document Released: 02/24/2005 Document Revised: 01/13/2016 Document Reviewed: 06/24/2015 Elsevier Interactive Patient Education  2018 [place] [employer] Anesthesia, Adult, Care After These instructions provide you with information about caring for yourself after your procedure. Your health care provider may also give you more specific instructions. Your treatment has been planned according to current medical practices, but problems sometimes occur.  Call your health care provider if you have any problems or questions after your procedure. What can I expect after the procedure? After the procedure, it is common to have:  Vomiting.  A sore throat.  Mental slowness.  It is common to feel:  Nauseous.  Cold or shivery.  Sleepy.  Tired.  Sore or achy, even in parts of your body where you did not have surgery.  Follow these instructions at home: For at least 24 hours after the procedure:  Do not: ? Participate in activities where you could fall or become injured. ? Drive. ? Use heavy machinery. ? Drink alcohol. ? Take sleeping pills or medicines that cause drowsiness. ? Make important decisions or sign legal documents. ? Take care of children on your own.  Rest. Eating and drinking  If you vomit, drink water, juice, or soup when you can drink without vomiting.  Drink enough fluid to keep your urine clear or pale yellow.  Make sure you have little or no nausea before eating solid foods.  Follow the diet recommended by your health care provider. General instructions  Have a responsible adult stay with you until you are awake and  alert.  Return to your normal activities as told by your health care provider. Ask your health care provider what activities are safe for you.  Take over-the-counter and prescription medicines only as told by your health care provider.  If you smoke, do not smoke without supervision.  Keep all follow-up visits as told by your health care provider. This is important. Contact a health care provider if:  You continue to have nausea or vomiting at home, and medicines are not helpful.  You cannot drink fluids or start eating again.  You cannot urinate after 8-12 hours.  You develop a skin rash.  You have fever.  You have increasing redness at the site of your procedure. Get help right away if:  You have difficulty breathing.  You have chest pain.  You have unexpected  bleeding.  You feel that you are having a life-threatening or urgent problem. This information is not intended to replace advice given to you by your health care provider. Make sure you discuss any questions you have with your health care provider. Document Released: 11/13/2000 Document Revised: 01/10/2016 Document Reviewed: 07/22/2015 Elsevier Interactive Patient Education  [employer].

## 2018-04-23 NOTE — Addendum Note (Signed)
Addendum  created 04/23/18 1133 by Mitzie Na, CRNA   Charge Capture section accepted

## 2018-04-24 ENCOUNTER — Encounter (HOSPITAL_COMMUNITY): Payer: Self-pay | Admitting: Urology

## 2018-04-24 DIAGNOSIS — D631 Anemia in chronic kidney disease: Secondary | ICD-10-CM | POA: Diagnosis not present

## 2018-04-24 DIAGNOSIS — N2581 Secondary hyperparathyroidism of renal origin: Secondary | ICD-10-CM | POA: Diagnosis not present

## 2018-04-24 DIAGNOSIS — D6959 Other secondary thrombocytopenia: Secondary | ICD-10-CM | POA: Diagnosis not present

## 2018-04-24 DIAGNOSIS — N186 End stage renal disease: Secondary | ICD-10-CM | POA: Diagnosis not present

## 2018-04-24 DIAGNOSIS — D500 Iron deficiency anemia secondary to blood loss (chronic): Secondary | ICD-10-CM | POA: Diagnosis not present

## 2018-04-24 DIAGNOSIS — Z23 Encounter for immunization: Secondary | ICD-10-CM | POA: Diagnosis not present

## 2018-04-26 DIAGNOSIS — N2581 Secondary hyperparathyroidism of renal origin: Secondary | ICD-10-CM | POA: Diagnosis not present

## 2018-04-26 DIAGNOSIS — Z23 Encounter for immunization: Secondary | ICD-10-CM | POA: Diagnosis not present

## 2018-04-26 DIAGNOSIS — D500 Iron deficiency anemia secondary to blood loss (chronic): Secondary | ICD-10-CM | POA: Diagnosis not present

## 2018-04-26 DIAGNOSIS — N186 End stage renal disease: Secondary | ICD-10-CM | POA: Diagnosis not present

## 2018-04-26 DIAGNOSIS — D6959 Other secondary thrombocytopenia: Secondary | ICD-10-CM | POA: Diagnosis not present

## 2018-04-26 DIAGNOSIS — D631 Anemia in chronic kidney disease: Secondary | ICD-10-CM | POA: Diagnosis not present

## 2018-04-29 DIAGNOSIS — N186 End stage renal disease: Secondary | ICD-10-CM | POA: Diagnosis not present

## 2018-04-29 DIAGNOSIS — Z23 Encounter for immunization: Secondary | ICD-10-CM | POA: Diagnosis not present

## 2018-04-29 DIAGNOSIS — N2581 Secondary hyperparathyroidism of renal origin: Secondary | ICD-10-CM | POA: Diagnosis not present

## 2018-04-29 DIAGNOSIS — D631 Anemia in chronic kidney disease: Secondary | ICD-10-CM | POA: Diagnosis not present

## 2018-04-29 DIAGNOSIS — D6959 Other secondary thrombocytopenia: Secondary | ICD-10-CM | POA: Diagnosis not present

## 2018-04-29 DIAGNOSIS — D500 Iron deficiency anemia secondary to blood loss (chronic): Secondary | ICD-10-CM | POA: Diagnosis not present

## 2018-05-01 DIAGNOSIS — D6959 Other secondary thrombocytopenia: Secondary | ICD-10-CM | POA: Diagnosis not present

## 2018-05-01 DIAGNOSIS — D631 Anemia in chronic kidney disease: Secondary | ICD-10-CM | POA: Diagnosis not present

## 2018-05-01 DIAGNOSIS — Z23 Encounter for immunization: Secondary | ICD-10-CM | POA: Diagnosis not present

## 2018-05-01 DIAGNOSIS — N2581 Secondary hyperparathyroidism of renal origin: Secondary | ICD-10-CM | POA: Diagnosis not present

## 2018-05-01 DIAGNOSIS — N186 End stage renal disease: Secondary | ICD-10-CM | POA: Diagnosis not present

## 2018-05-01 DIAGNOSIS — D500 Iron deficiency anemia secondary to blood loss (chronic): Secondary | ICD-10-CM | POA: Diagnosis not present

## 2018-05-02 DIAGNOSIS — Z9181 History of falling: Secondary | ICD-10-CM | POA: Diagnosis not present

## 2018-05-02 DIAGNOSIS — I10 Essential (primary) hypertension: Secondary | ICD-10-CM | POA: Diagnosis not present

## 2018-05-02 DIAGNOSIS — F5104 Psychophysiologic insomnia: Secondary | ICD-10-CM | POA: Diagnosis not present

## 2018-05-02 DIAGNOSIS — G4733 Obstructive sleep apnea (adult) (pediatric): Secondary | ICD-10-CM | POA: Diagnosis not present

## 2018-05-03 DIAGNOSIS — N2581 Secondary hyperparathyroidism of renal origin: Secondary | ICD-10-CM | POA: Diagnosis not present

## 2018-05-03 DIAGNOSIS — D500 Iron deficiency anemia secondary to blood loss (chronic): Secondary | ICD-10-CM | POA: Diagnosis not present

## 2018-05-03 DIAGNOSIS — D631 Anemia in chronic kidney disease: Secondary | ICD-10-CM | POA: Diagnosis not present

## 2018-05-03 DIAGNOSIS — Z23 Encounter for immunization: Secondary | ICD-10-CM | POA: Diagnosis not present

## 2018-05-03 DIAGNOSIS — D6959 Other secondary thrombocytopenia: Secondary | ICD-10-CM | POA: Diagnosis not present

## 2018-05-03 DIAGNOSIS — N186 End stage renal disease: Secondary | ICD-10-CM | POA: Diagnosis not present

## 2018-05-06 ENCOUNTER — Other Ambulatory Visit: Payer: Self-pay | Admitting: Family Medicine

## 2018-05-06 DIAGNOSIS — Z23 Encounter for immunization: Secondary | ICD-10-CM | POA: Diagnosis not present

## 2018-05-06 DIAGNOSIS — N186 End stage renal disease: Secondary | ICD-10-CM | POA: Diagnosis not present

## 2018-05-06 DIAGNOSIS — D631 Anemia in chronic kidney disease: Secondary | ICD-10-CM | POA: Diagnosis not present

## 2018-05-06 DIAGNOSIS — N2581 Secondary hyperparathyroidism of renal origin: Secondary | ICD-10-CM | POA: Diagnosis not present

## 2018-05-06 DIAGNOSIS — D500 Iron deficiency anemia secondary to blood loss (chronic): Secondary | ICD-10-CM | POA: Diagnosis not present

## 2018-05-06 DIAGNOSIS — D6959 Other secondary thrombocytopenia: Secondary | ICD-10-CM | POA: Diagnosis not present

## 2018-05-08 DIAGNOSIS — D631 Anemia in chronic kidney disease: Secondary | ICD-10-CM | POA: Diagnosis not present

## 2018-05-08 DIAGNOSIS — D6959 Other secondary thrombocytopenia: Secondary | ICD-10-CM | POA: Diagnosis not present

## 2018-05-08 DIAGNOSIS — N186 End stage renal disease: Secondary | ICD-10-CM | POA: Diagnosis not present

## 2018-05-08 DIAGNOSIS — Z23 Encounter for immunization: Secondary | ICD-10-CM | POA: Diagnosis not present

## 2018-05-08 DIAGNOSIS — D500 Iron deficiency anemia secondary to blood loss (chronic): Secondary | ICD-10-CM | POA: Diagnosis not present

## 2018-05-08 DIAGNOSIS — N2581 Secondary hyperparathyroidism of renal origin: Secondary | ICD-10-CM | POA: Diagnosis not present

## 2018-05-10 DIAGNOSIS — N186 End stage renal disease: Secondary | ICD-10-CM | POA: Diagnosis not present

## 2018-05-10 DIAGNOSIS — D631 Anemia in chronic kidney disease: Secondary | ICD-10-CM | POA: Diagnosis not present

## 2018-05-10 DIAGNOSIS — D6959 Other secondary thrombocytopenia: Secondary | ICD-10-CM | POA: Diagnosis not present

## 2018-05-10 DIAGNOSIS — Z23 Encounter for immunization: Secondary | ICD-10-CM | POA: Diagnosis not present

## 2018-05-10 DIAGNOSIS — D500 Iron deficiency anemia secondary to blood loss (chronic): Secondary | ICD-10-CM | POA: Diagnosis not present

## 2018-05-10 DIAGNOSIS — N2581 Secondary hyperparathyroidism of renal origin: Secondary | ICD-10-CM | POA: Diagnosis not present

## 2018-05-13 DIAGNOSIS — D6959 Other secondary thrombocytopenia: Secondary | ICD-10-CM | POA: Diagnosis not present

## 2018-05-13 DIAGNOSIS — D500 Iron deficiency anemia secondary to blood loss (chronic): Secondary | ICD-10-CM | POA: Diagnosis not present

## 2018-05-13 DIAGNOSIS — N2581 Secondary hyperparathyroidism of renal origin: Secondary | ICD-10-CM | POA: Diagnosis not present

## 2018-05-13 DIAGNOSIS — Z23 Encounter for immunization: Secondary | ICD-10-CM | POA: Diagnosis not present

## 2018-05-13 DIAGNOSIS — D631 Anemia in chronic kidney disease: Secondary | ICD-10-CM | POA: Diagnosis not present

## 2018-05-13 DIAGNOSIS — N186 End stage renal disease: Secondary | ICD-10-CM | POA: Diagnosis not present

## 2018-05-14 DIAGNOSIS — R310 Gross hematuria: Secondary | ICD-10-CM | POA: Diagnosis not present

## 2018-05-14 DIAGNOSIS — D3002 Benign neoplasm of left kidney: Secondary | ICD-10-CM | POA: Diagnosis not present

## 2018-05-15 ENCOUNTER — Other Ambulatory Visit: Payer: Self-pay | Admitting: Urology

## 2018-05-15 DIAGNOSIS — D3002 Benign neoplasm of left kidney: Secondary | ICD-10-CM

## 2018-05-15 DIAGNOSIS — D6959 Other secondary thrombocytopenia: Secondary | ICD-10-CM | POA: Diagnosis not present

## 2018-05-15 DIAGNOSIS — N186 End stage renal disease: Secondary | ICD-10-CM | POA: Diagnosis not present

## 2018-05-15 DIAGNOSIS — Z23 Encounter for immunization: Secondary | ICD-10-CM | POA: Diagnosis not present

## 2018-05-15 DIAGNOSIS — N2581 Secondary hyperparathyroidism of renal origin: Secondary | ICD-10-CM | POA: Diagnosis not present

## 2018-05-15 DIAGNOSIS — D631 Anemia in chronic kidney disease: Secondary | ICD-10-CM | POA: Diagnosis not present

## 2018-05-15 DIAGNOSIS — D500 Iron deficiency anemia secondary to blood loss (chronic): Secondary | ICD-10-CM | POA: Diagnosis not present

## 2018-05-17 DIAGNOSIS — N186 End stage renal disease: Secondary | ICD-10-CM | POA: Diagnosis not present

## 2018-05-17 DIAGNOSIS — N2581 Secondary hyperparathyroidism of renal origin: Secondary | ICD-10-CM | POA: Diagnosis not present

## 2018-05-17 DIAGNOSIS — D6959 Other secondary thrombocytopenia: Secondary | ICD-10-CM | POA: Diagnosis not present

## 2018-05-17 DIAGNOSIS — D631 Anemia in chronic kidney disease: Secondary | ICD-10-CM | POA: Diagnosis not present

## 2018-05-17 DIAGNOSIS — Z23 Encounter for immunization: Secondary | ICD-10-CM | POA: Diagnosis not present

## 2018-05-17 DIAGNOSIS — D500 Iron deficiency anemia secondary to blood loss (chronic): Secondary | ICD-10-CM | POA: Diagnosis not present

## 2018-05-20 DIAGNOSIS — Z992 Dependence on renal dialysis: Secondary | ICD-10-CM | POA: Diagnosis not present

## 2018-05-20 DIAGNOSIS — Z23 Encounter for immunization: Secondary | ICD-10-CM | POA: Diagnosis not present

## 2018-05-20 DIAGNOSIS — N2581 Secondary hyperparathyroidism of renal origin: Secondary | ICD-10-CM | POA: Diagnosis not present

## 2018-05-20 DIAGNOSIS — D500 Iron deficiency anemia secondary to blood loss (chronic): Secondary | ICD-10-CM | POA: Diagnosis not present

## 2018-05-20 DIAGNOSIS — N186 End stage renal disease: Secondary | ICD-10-CM | POA: Diagnosis not present

## 2018-05-20 DIAGNOSIS — D6959 Other secondary thrombocytopenia: Secondary | ICD-10-CM | POA: Diagnosis not present

## 2018-05-20 DIAGNOSIS — D631 Anemia in chronic kidney disease: Secondary | ICD-10-CM | POA: Diagnosis not present

## 2018-05-21 DIAGNOSIS — E113293 Type 2 diabetes mellitus with mild nonproliferative diabetic retinopathy without macular edema, bilateral: Secondary | ICD-10-CM | POA: Diagnosis not present

## 2018-05-21 DIAGNOSIS — H26493 Other secondary cataract, bilateral: Secondary | ICD-10-CM | POA: Diagnosis not present

## 2018-05-22 DIAGNOSIS — E1129 Type 2 diabetes mellitus with other diabetic kidney complication: Secondary | ICD-10-CM | POA: Diagnosis not present

## 2018-05-22 DIAGNOSIS — D500 Iron deficiency anemia secondary to blood loss (chronic): Secondary | ICD-10-CM | POA: Diagnosis not present

## 2018-05-22 DIAGNOSIS — D631 Anemia in chronic kidney disease: Secondary | ICD-10-CM | POA: Diagnosis not present

## 2018-05-22 DIAGNOSIS — N186 End stage renal disease: Secondary | ICD-10-CM | POA: Diagnosis not present

## 2018-05-22 DIAGNOSIS — Z23 Encounter for immunization: Secondary | ICD-10-CM | POA: Diagnosis not present

## 2018-05-22 DIAGNOSIS — N2581 Secondary hyperparathyroidism of renal origin: Secondary | ICD-10-CM | POA: Diagnosis not present

## 2018-05-24 DIAGNOSIS — Z79899 Other long term (current) drug therapy: Secondary | ICD-10-CM | POA: Diagnosis not present

## 2018-05-24 DIAGNOSIS — D631 Anemia in chronic kidney disease: Secondary | ICD-10-CM | POA: Diagnosis not present

## 2018-05-24 DIAGNOSIS — E1129 Type 2 diabetes mellitus with other diabetic kidney complication: Secondary | ICD-10-CM | POA: Diagnosis not present

## 2018-05-24 DIAGNOSIS — G4700 Insomnia, unspecified: Secondary | ICD-10-CM | POA: Diagnosis not present

## 2018-05-24 DIAGNOSIS — G8929 Other chronic pain: Secondary | ICD-10-CM | POA: Diagnosis not present

## 2018-05-24 DIAGNOSIS — N2581 Secondary hyperparathyroidism of renal origin: Secondary | ICD-10-CM | POA: Diagnosis not present

## 2018-05-24 DIAGNOSIS — D500 Iron deficiency anemia secondary to blood loss (chronic): Secondary | ICD-10-CM | POA: Diagnosis not present

## 2018-05-24 DIAGNOSIS — N186 End stage renal disease: Secondary | ICD-10-CM | POA: Diagnosis not present

## 2018-05-24 DIAGNOSIS — M47896 Other spondylosis, lumbar region: Secondary | ICD-10-CM | POA: Diagnosis not present

## 2018-05-27 DIAGNOSIS — N2581 Secondary hyperparathyroidism of renal origin: Secondary | ICD-10-CM | POA: Diagnosis not present

## 2018-05-27 DIAGNOSIS — N186 End stage renal disease: Secondary | ICD-10-CM | POA: Diagnosis not present

## 2018-05-27 DIAGNOSIS — E1129 Type 2 diabetes mellitus with other diabetic kidney complication: Secondary | ICD-10-CM | POA: Diagnosis not present

## 2018-05-27 DIAGNOSIS — D631 Anemia in chronic kidney disease: Secondary | ICD-10-CM | POA: Diagnosis not present

## 2018-05-27 DIAGNOSIS — D500 Iron deficiency anemia secondary to blood loss (chronic): Secondary | ICD-10-CM | POA: Diagnosis not present

## 2018-05-28 ENCOUNTER — Ambulatory Visit (INDEPENDENT_AMBULATORY_CARE_PROVIDER_SITE_OTHER): Payer: Medicare Other | Admitting: Family Medicine

## 2018-05-28 ENCOUNTER — Other Ambulatory Visit: Payer: Self-pay | Admitting: *Deleted

## 2018-05-28 ENCOUNTER — Telehealth: Payer: Self-pay | Admitting: Family Medicine

## 2018-05-28 ENCOUNTER — Encounter: Payer: Self-pay | Admitting: Family Medicine

## 2018-05-28 VITALS — BP 130/84 | HR 66 | Temp 98.4°F | Resp 12 | Ht 67.0 in | Wt 275.2 lb

## 2018-05-28 DIAGNOSIS — E1165 Type 2 diabetes mellitus with hyperglycemia: Secondary | ICD-10-CM

## 2018-05-28 DIAGNOSIS — I10 Essential (primary) hypertension: Secondary | ICD-10-CM

## 2018-05-28 DIAGNOSIS — E1151 Type 2 diabetes mellitus with diabetic peripheral angiopathy without gangrene: Secondary | ICD-10-CM | POA: Diagnosis not present

## 2018-05-28 DIAGNOSIS — E1142 Type 2 diabetes mellitus with diabetic polyneuropathy: Secondary | ICD-10-CM

## 2018-05-28 DIAGNOSIS — M1A9XX0 Chronic gout, unspecified, without tophus (tophi): Secondary | ICD-10-CM | POA: Diagnosis not present

## 2018-05-28 DIAGNOSIS — IMO0002 Reserved for concepts with insufficient information to code with codable children: Secondary | ICD-10-CM

## 2018-05-28 MED ORDER — COLCHICINE 0.6 MG PO TABS: ORAL_TABLET | ORAL | 2 refills | Status: DC

## 2018-05-28 MED ORDER — INSULIN ASPART 100 UNIT/ML FLEXPEN: 2.0000 [IU] | PEN_INJECTOR | Freq: Three times a day (TID) | SUBCUTANEOUS | 3 refills | Status: DC

## 2018-05-28 MED ORDER — INSULIN DEGLUDEC 100 UNIT/ML ~~LOC~~ SOPN: 50.0000 [IU] | PEN_INJECTOR | Freq: Every day | SUBCUTANEOUS | 3 refills | Status: DC

## 2018-05-28 NOTE — Progress Notes (Signed)
HPI:   [name] is a 67 y.o. male, who is here today for 4-5 months follow up.   He was last seen on 01/08/18.  Since he last OV she has urologist due to gross hematuria. Right renal cyst found and no abnormality in bladder.  Ureteral stent placed right side and removed 2 weeks ago.  She was treated for UTI with 6 weeks doxycycline.  He has not had episodes of gross hematuria since his last visit.  According to pt,he will have renal MRI to follow on right renal cyst, 06/16/18; and planning gon repeating in 7 months. BPH on Flomax 0.4 mg daily.    DM II: Currently he is on NovoLog 2 to 25 units 3 times per day with meals and glipizide 10 mg daily. He has had a few BS in the low 60s, asymptomatic. In general he is giving himself 10 units of NovoLog in average daily. BS 120s.  Last eye exam 05/21/18, mild retinopathy.  + Peripheral neuropathy , burning sensation on feet stable. He is on Amitriptyline 25 mg at bedtime.   Lab Results  Component Value Date   HGBA1C 4.7 (L) 04/18/2018   He started hemodialysis about 2 months ago, he is reporting feeling "much better" since he started it. He feels more energetic.   Lab Results  Component Value Date   WBC 7.2 04/18/2018   HGB 10.1 (L) 04/18/2018   HCT 32.7 (L) 04/18/2018   MCV 97.9 04/18/2018   PLT 225 04/18/2018    Gout: He takes Prednisone as needed. He has not had a flare up in months. He was on Allopurinol before. He has had 2 acute episodes in the past 6 months.  HTN:  He is on Hydralazine 50 mg tid, coreg 6.25 mg bid, and Imdur 60 mg daily. Denies severe/frequent headache, visual changes, chest pain, dyspnea, palpitation,focal weakness, or worsening edema.   Review of Systems  Constitutional: Negative for activity change, appetite change, fatigue and fever.  HENT: Negative for nosebleeds, sore throat and trouble swallowing.   Eyes: Negative for redness and visual disturbance.    Respiratory: Negative for cough, shortness of breath and wheezing.   Cardiovascular: Negative for chest pain, palpitations and leg swelling.  Gastrointestinal: Negative for abdominal pain, nausea and vomiting.  Endocrine: Negative for polydipsia, polyphagia and polyuria.  Genitourinary: Negative for decreased urine volume, dysuria and hematuria.  Musculoskeletal: Positive for arthralgias and gait problem.  Neurological: Negative for syncope, weakness and numbness.    Current Outpatient Medications on File Prior to Visit  Medication Sig Dispense Refill  . amitriptyline (ELAVIL) 25 MG tablet Take 25 mg by mouth at bedtime.     Marland Kitchen aspirin EC 81 MG tablet Take 81 mg by mouth daily.    [name] 1 GM 210 MG(Fe) tablet     . calcium acetate (PHOSLO) 667 MG capsule Take 1 capsule (667 mg total) by mouth 3 (three) times daily with meals. 90 capsule 0  . carvedilol (COREG) 6.25 MG tablet TAKE 1 TABLET BY MOUTH TWICE DAILY WITH  MEALS (Patient taking differently: Take 6.25 mg by mouth 2 (two) times daily with a meal. ) 180 tablet 3  . Cholecalciferol (VITAMIN D) 2000 units tablet Take 2,000 Units by mouth daily.    Marland Kitchen doxycycline (VIBRAMYCIN) 100 MG capsule Take 100 mg by mouth 2 (two) times daily.  0  . hydrALAZINE (APRESOLINE) 50 MG tablet Take 1 tablet (50 mg total) by mouth 3 (  three) times daily. 90 tablet 2  . isosorbide mononitrate (IMDUR) 60 MG 24 hr tablet TAKE 1 TABLET BY MOUTH ONCE DAILY (Patient taking differently: Take 60 mg by mouth daily. ) 90 tablet 2  . lidocaine-prilocaine (EMLA) cream APPLY SMALL AMOUNT TO ACCESS SITE (AVF) 1 TO 2 HOURS BEFORE DIALYSIS. COVER WITH OCCLUSIVE DRESSING (SARAN WRAP)  12  . montelukast (SINGULAIR) 10 MG tablet Take 10 mg by mouth at bedtime.     . nitroGLYCERIN (NITROSTAT) 0.4 MG SL tablet Place 1 tablet (0.4 mg total) under the tongue every 5 (five) minutes as needed for chest pain. 30 tablet 12  . Oxycodone HCl 10 MG TABS Take 10 mg by mouth 4 (four)  times daily as needed (for pain).     . simvastatin (ZOCOR) 40 MG tablet TAKE 1 TABLET BY MOUTH EVERY DAY AT BEDTIME (Patient taking differently: Take 40 mg by mouth at bedtime. ) 90 tablet 3  . tamsulosin (FLOMAX) 0.4 MG CAPS capsule Take 1 capsule (0.4 mg total) by mouth daily. 30 capsule 0  . torsemide (DEMADEX) 20 MG tablet Take 1 tablet (20 mg total) by mouth daily. Please discuss with your nephrologist Dr Marlowe Sax regarding further dosing adjustment of this meds. Please discuss with Dr Marlowe Sax regarding timing of starting on dialysis. 10 tablet 0   No current facility-administered medications on file prior to visit.      Past Medical History:  Diagnosis Date  . Acute renal failure superimposed on chronic kidney disease ([place])    [name] 11/01/2016  . Anemia   . Arthritis    "back, knees" (11/01/2016)  . Asthma   . CAD (coronary artery disease) 12/31/07   [facility] stents- placed in the circumflex and LAD  . Chronic bronchitis ([place])   . Hemodialysis status ([place])   . History of blood transfusion 10/30/2016   "related to anemia"  . History of gout   . History of kidney stones   . Hyperlipidemia   . Hypertension   . NSTEMI (non-ST elevated myocardial infarction) ([place]) 10/30/2016  . OA (osteoarthritis)   . Obese   . On home oxygen therapy    "2L w/CPAP" (11/01/2016)  . OSA on CPAP   . Psoriatic arthritis ([place])   . Type II diabetes mellitus ([facility])    No Known Allergies  Social History   Socioeconomic History  . Marital status: Divorced    Spouse name: Not on file  . Number of children: Not on file  . Years of education: Not on file  . Highest education level: Not on file  Occupational History  . Not on file  Social Needs  . Financial resource strain: Not on file  . Food insecurity:    Worry: Not on file    Inability: Not on file  . Transportation needs:    Medical: Not on file    Non-medical: Not on file  Tobacco Use  . Smoking status: Never Smoker  . Smokeless  tobacco: Never Used  Substance and Sexual Activity  . Alcohol use: Not Currently    Comment: 11/01/2016 "nothing in years"  . Drug use: No  . Sexual activity: Yes  Lifestyle  . Physical activity:    Days per week: Not on file    Minutes per session: Not on file  . Stress: Not on file  Relationships  . Social connections:    Talks on phone: Not on file    Gets together: Not on file    Attends  religious service: Not on file    Active member of club or organization: Not on file    Attends meetings of clubs or organizations: Not on file    Relationship status: Not on file  Other Topics Concern  . Not on file  Social History Narrative  . Not on file    Vitals:   05/28/18 0833  BP: 130/84  Pulse: 66  Resp: 12  Temp: 98.4 F (36.9 C)  SpO2: 99%   Body mass index is 43.11 kg/m.      Physical Exam  Nursing note reviewed. Constitutional: He is oriented to person, place, and time. He appears well-developed. No distress.  HENT:  Head: Normocephalic and atraumatic.  Mouth/Throat: Oropharynx is clear and moist and mucous membranes are normal.  Eyes: Pupils are equal, round, and reactive to light. Conjunctivae are normal.  Cardiovascular: Normal rate and regular rhythm.  No murmur heard. Pulses:      Dorsalis pedis pulses are 2+ on the right side, and 2+ on the left side.  Respiratory: Effort normal and breath sounds normal. No respiratory distress.  GI: Soft. He exhibits no mass. There is no hepatomegaly. There is no tenderness.  Musculoskeletal: He exhibits edema (1+ pitting LE edema,bilateral.).  Lymphadenopathy:    He has no cervical adenopathy.  Neurological: He is alert and oriented to person, place, and time. He has normal strength. No cranial nerve deficit. Gait normal.  Skin: Skin is warm. No rash noted. No erythema.  Psychiatric: He has a normal mood and affect. Cognition and memory are normal.  Well groomed, good eye contact.    ASSESSMENT AND PLAN:   Mr.  Kent Osborne was seen today for 4-5 months follow-up.  No orders of the defined types were placed in this encounter.   Essential hypertension BP adequately controlled. No changes in current management. Continue monitoring BP at home. Continue low-salt diet. Follow-up in 6 months.  Type II diabetes mellitus with circulatory disorder, uncontrolled HgA1C in 03/2018 was 4.7 and he is also having a few episodes of hypoglycemia. Recommend discontinuing glipizide. He will continue on NovoLog 3 times daily before meals by sliding scale. No changes in [place] 50 units daily.   Regular exercise as tolerated and healthful diet, avoiding added sugar food intake is an important part of treatment and recommended. Annual eye exam, it is current.  F/U in 5-6 months   Diabetic peripheral neuropathy associated with type 2 diabetes mellitus (HCC) Stable. Adequate DM 2 controlled. Educated about foot care. Continue amitriptyline 25 mg at bedtime.   GOUT, UNSPECIFIED Recommend taking colchicine 0.6 mg 2 tablets upon acute onset of symptoms and as needed. For now I recommend continuing with low purines diet. He is on torsemide, which could aggravate problem. If episodes of acute gout become more frequent, we will consider allopurinol 100 mg daily. Follow-up in 6 months.      Betty G. Martinique, MD  [facility]. [place] office.

## 2018-05-28 NOTE — Patient Instructions (Addendum)
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  Please be sure medication list is accurate. If a new problem present, please set up appointment sooner than planned today.

## 2018-05-28 NOTE — Assessment & Plan Note (Addendum)
Stable. Adequate DM 2 controlled. Educated about foot care. Continue amitriptyline 25 mg at bedtime.

## 2018-05-28 NOTE — Assessment & Plan Note (Signed)
BP adequately controlled. No changes in current management. Continue monitoring BP at home. Continue low-salt diet. Follow-up in 6 months.

## 2018-05-28 NOTE — Assessment & Plan Note (Signed)
Recommend taking colchicine 0.6 mg 2 tablets upon acute onset of symptoms and as needed. For now I recommend continuing with low purines diet. He is on torsemide, which could aggravate problem. If episodes of acute gout become more frequent, we will consider allopurinol 100 mg daily. Follow-up in 6 months.

## 2018-05-28 NOTE — Telephone Encounter (Signed)
Rx sent to pharmacy for 45 pens as requested.

## 2018-05-28 NOTE — Assessment & Plan Note (Signed)
HgA1C in 03/2018 was 4.7 and he is also having a few episodes of hypoglycemia. Recommend discontinuing glipizide. He will continue on NovoLog 3 times daily before meals by sliding scale. No changes in [place] 50 units daily.   Regular exercise as tolerated and healthful diet, avoiding added sugar food intake is an important part of treatment and recommended. Annual eye exam, it is current.  F/U in 5-6 months

## 2018-05-28 NOTE — Telephone Encounter (Signed)
Copied from [place] [id number]. Topic: Quick Communication - See Telephone Encounter >> May 28, 2018 11:43 AM Alfredia Ferguson R wrote: Patient called in and stated he needs a quantity of 45 pens of insulin degludec (TRESIBA FLEXTOUCH) 100 UNIT/ML SOPN FlexTouch Pen to be sent in to equal 3 boxes

## 2018-05-29 DIAGNOSIS — E1129 Type 2 diabetes mellitus with other diabetic kidney complication: Secondary | ICD-10-CM | POA: Diagnosis not present

## 2018-05-29 DIAGNOSIS — D631 Anemia in chronic kidney disease: Secondary | ICD-10-CM | POA: Diagnosis not present

## 2018-05-29 DIAGNOSIS — D500 Iron deficiency anemia secondary to blood loss (chronic): Secondary | ICD-10-CM | POA: Diagnosis not present

## 2018-05-29 DIAGNOSIS — N2581 Secondary hyperparathyroidism of renal origin: Secondary | ICD-10-CM | POA: Diagnosis not present

## 2018-05-29 DIAGNOSIS — N186 End stage renal disease: Secondary | ICD-10-CM | POA: Diagnosis not present

## 2018-05-30 ENCOUNTER — Other Ambulatory Visit: Payer: Self-pay | Admitting: Family Medicine

## 2018-05-31 DIAGNOSIS — D631 Anemia in chronic kidney disease: Secondary | ICD-10-CM | POA: Diagnosis not present

## 2018-05-31 DIAGNOSIS — N186 End stage renal disease: Secondary | ICD-10-CM | POA: Diagnosis not present

## 2018-05-31 DIAGNOSIS — E1129 Type 2 diabetes mellitus with other diabetic kidney complication: Secondary | ICD-10-CM | POA: Diagnosis not present

## 2018-05-31 DIAGNOSIS — N2581 Secondary hyperparathyroidism of renal origin: Secondary | ICD-10-CM | POA: Diagnosis not present

## 2018-05-31 DIAGNOSIS — D500 Iron deficiency anemia secondary to blood loss (chronic): Secondary | ICD-10-CM | POA: Diagnosis not present

## 2018-06-01 ENCOUNTER — Encounter: Payer: Self-pay | Admitting: Family Medicine

## 2018-06-03 DIAGNOSIS — N2581 Secondary hyperparathyroidism of renal origin: Secondary | ICD-10-CM | POA: Diagnosis not present

## 2018-06-03 DIAGNOSIS — D500 Iron deficiency anemia secondary to blood loss (chronic): Secondary | ICD-10-CM | POA: Diagnosis not present

## 2018-06-03 DIAGNOSIS — N186 End stage renal disease: Secondary | ICD-10-CM | POA: Diagnosis not present

## 2018-06-03 DIAGNOSIS — E1129 Type 2 diabetes mellitus with other diabetic kidney complication: Secondary | ICD-10-CM | POA: Diagnosis not present

## 2018-06-03 DIAGNOSIS — D631 Anemia in chronic kidney disease: Secondary | ICD-10-CM | POA: Diagnosis not present

## 2018-06-04 ENCOUNTER — Other Ambulatory Visit: Payer: Self-pay | Admitting: Family Medicine

## 2018-06-05 DIAGNOSIS — D500 Iron deficiency anemia secondary to blood loss (chronic): Secondary | ICD-10-CM | POA: Diagnosis not present

## 2018-06-05 DIAGNOSIS — D631 Anemia in chronic kidney disease: Secondary | ICD-10-CM | POA: Diagnosis not present

## 2018-06-05 DIAGNOSIS — E1129 Type 2 diabetes mellitus with other diabetic kidney complication: Secondary | ICD-10-CM | POA: Diagnosis not present

## 2018-06-05 DIAGNOSIS — N2581 Secondary hyperparathyroidism of renal origin: Secondary | ICD-10-CM | POA: Diagnosis not present

## 2018-06-05 DIAGNOSIS — N186 End stage renal disease: Secondary | ICD-10-CM | POA: Diagnosis not present

## 2018-06-05 MED ORDER — GLUCOSE BLOOD VI STRP: ORAL_STRIP | 5 refills | Status: DC

## 2018-06-05 NOTE — Telephone Encounter (Signed)
Pt states he called the pharmacy and they are waiting for the doctors office to send a code for Medicare before they can fill this medication for the pt. Please advise. [facility] in [place].

## 2018-06-05 NOTE — Telephone Encounter (Signed)
rx sent with code

## 2018-06-05 NOTE — Addendum Note (Signed)
Addended by: Westley Hummer B on: 06/05/2018 02:40 PM   Modules accepted: Orders

## 2018-06-07 DIAGNOSIS — D500 Iron deficiency anemia secondary to blood loss (chronic): Secondary | ICD-10-CM | POA: Diagnosis not present

## 2018-06-07 DIAGNOSIS — N186 End stage renal disease: Secondary | ICD-10-CM | POA: Diagnosis not present

## 2018-06-07 DIAGNOSIS — N2581 Secondary hyperparathyroidism of renal origin: Secondary | ICD-10-CM | POA: Diagnosis not present

## 2018-06-07 DIAGNOSIS — E1129 Type 2 diabetes mellitus with other diabetic kidney complication: Secondary | ICD-10-CM | POA: Diagnosis not present

## 2018-06-07 DIAGNOSIS — D631 Anemia in chronic kidney disease: Secondary | ICD-10-CM | POA: Diagnosis not present

## 2018-06-10 DIAGNOSIS — D500 Iron deficiency anemia secondary to blood loss (chronic): Secondary | ICD-10-CM | POA: Diagnosis not present

## 2018-06-10 DIAGNOSIS — E1129 Type 2 diabetes mellitus with other diabetic kidney complication: Secondary | ICD-10-CM | POA: Diagnosis not present

## 2018-06-10 DIAGNOSIS — N2581 Secondary hyperparathyroidism of renal origin: Secondary | ICD-10-CM | POA: Diagnosis not present

## 2018-06-10 DIAGNOSIS — N186 End stage renal disease: Secondary | ICD-10-CM | POA: Diagnosis not present

## 2018-06-10 DIAGNOSIS — D631 Anemia in chronic kidney disease: Secondary | ICD-10-CM | POA: Diagnosis not present

## 2018-06-11 DIAGNOSIS — M79673 Pain in unspecified foot: Secondary | ICD-10-CM | POA: Diagnosis not present

## 2018-06-11 DIAGNOSIS — R7989 Other specified abnormal findings of blood chemistry: Secondary | ICD-10-CM | POA: Diagnosis not present

## 2018-06-11 DIAGNOSIS — E1140 Type 2 diabetes mellitus with diabetic neuropathy, unspecified: Secondary | ICD-10-CM | POA: Diagnosis not present

## 2018-06-11 DIAGNOSIS — M109 Gout, unspecified: Secondary | ICD-10-CM | POA: Diagnosis not present

## 2018-06-11 DIAGNOSIS — E559 Vitamin D deficiency, unspecified: Secondary | ICD-10-CM | POA: Diagnosis not present

## 2018-06-11 DIAGNOSIS — Z992 Dependence on renal dialysis: Secondary | ICD-10-CM | POA: Diagnosis not present

## 2018-06-11 DIAGNOSIS — M2010 Hallux valgus (acquired), unspecified foot: Secondary | ICD-10-CM | POA: Diagnosis not present

## 2018-06-11 DIAGNOSIS — B351 Tinea unguium: Secondary | ICD-10-CM | POA: Diagnosis not present

## 2018-06-11 DIAGNOSIS — L600 Ingrowing nail: Secondary | ICD-10-CM | POA: Diagnosis not present

## 2018-06-12 ENCOUNTER — Telehealth: Payer: Self-pay | Admitting: Family Medicine

## 2018-06-12 DIAGNOSIS — D500 Iron deficiency anemia secondary to blood loss (chronic): Secondary | ICD-10-CM | POA: Diagnosis not present

## 2018-06-12 DIAGNOSIS — N2581 Secondary hyperparathyroidism of renal origin: Secondary | ICD-10-CM | POA: Diagnosis not present

## 2018-06-12 DIAGNOSIS — E1129 Type 2 diabetes mellitus with other diabetic kidney complication: Secondary | ICD-10-CM | POA: Diagnosis not present

## 2018-06-12 DIAGNOSIS — N186 End stage renal disease: Secondary | ICD-10-CM | POA: Diagnosis not present

## 2018-06-12 DIAGNOSIS — D631 Anemia in chronic kidney disease: Secondary | ICD-10-CM | POA: Diagnosis not present

## 2018-06-12 NOTE — Telephone Encounter (Signed)
Copied from [place] [id number]. Topic: General - Other >> Jun 12, 2018 12:53 PM Lennox Solders wrote: Reason for CRM: pt is calling to let dr Martinique know he test his blood sugar 4 times a day

## 2018-06-12 NOTE — Telephone Encounter (Signed)
Spoke with patient and confirmed number of times he was checking blood sugars.

## 2018-06-12 NOTE — Telephone Encounter (Signed)
FYI

## 2018-06-14 DIAGNOSIS — D500 Iron deficiency anemia secondary to blood loss (chronic): Secondary | ICD-10-CM | POA: Diagnosis not present

## 2018-06-14 DIAGNOSIS — N2581 Secondary hyperparathyroidism of renal origin: Secondary | ICD-10-CM | POA: Diagnosis not present

## 2018-06-14 DIAGNOSIS — D631 Anemia in chronic kidney disease: Secondary | ICD-10-CM | POA: Diagnosis not present

## 2018-06-14 DIAGNOSIS — N186 End stage renal disease: Secondary | ICD-10-CM | POA: Diagnosis not present

## 2018-06-14 DIAGNOSIS — E1129 Type 2 diabetes mellitus with other diabetic kidney complication: Secondary | ICD-10-CM | POA: Diagnosis not present

## 2018-06-17 DIAGNOSIS — E1129 Type 2 diabetes mellitus with other diabetic kidney complication: Secondary | ICD-10-CM | POA: Diagnosis not present

## 2018-06-17 DIAGNOSIS — N186 End stage renal disease: Secondary | ICD-10-CM | POA: Diagnosis not present

## 2018-06-17 DIAGNOSIS — N2581 Secondary hyperparathyroidism of renal origin: Secondary | ICD-10-CM | POA: Diagnosis not present

## 2018-06-17 DIAGNOSIS — D631 Anemia in chronic kidney disease: Secondary | ICD-10-CM | POA: Diagnosis not present

## 2018-06-17 DIAGNOSIS — D500 Iron deficiency anemia secondary to blood loss (chronic): Secondary | ICD-10-CM | POA: Diagnosis not present

## 2018-06-19 DIAGNOSIS — N186 End stage renal disease: Secondary | ICD-10-CM | POA: Diagnosis not present

## 2018-06-19 DIAGNOSIS — N2581 Secondary hyperparathyroidism of renal origin: Secondary | ICD-10-CM | POA: Diagnosis not present

## 2018-06-19 DIAGNOSIS — D500 Iron deficiency anemia secondary to blood loss (chronic): Secondary | ICD-10-CM | POA: Diagnosis not present

## 2018-06-19 DIAGNOSIS — E1129 Type 2 diabetes mellitus with other diabetic kidney complication: Secondary | ICD-10-CM | POA: Diagnosis not present

## 2018-06-19 DIAGNOSIS — D631 Anemia in chronic kidney disease: Secondary | ICD-10-CM | POA: Diagnosis not present

## 2018-06-20 ENCOUNTER — Ambulatory Visit
Admission: RE | Admit: 2018-06-20 | Discharge: 2018-06-20 | Disposition: A | Discharge status: Home / Self Care | Payer: Medicare Other | Source: Ambulatory Visit | Attending: Urology | Admitting: Urology

## 2018-06-20 DIAGNOSIS — Z992 Dependence on renal dialysis: Secondary | ICD-10-CM | POA: Diagnosis not present

## 2018-06-20 DIAGNOSIS — N281 Cyst of kidney, acquired: Secondary | ICD-10-CM | POA: Diagnosis not present

## 2018-06-20 DIAGNOSIS — D3002 Benign neoplasm of left kidney: Secondary | ICD-10-CM

## 2018-06-20 DIAGNOSIS — N186 End stage renal disease: Secondary | ICD-10-CM | POA: Diagnosis not present

## 2018-06-21 DIAGNOSIS — D500 Iron deficiency anemia secondary to blood loss (chronic): Secondary | ICD-10-CM | POA: Diagnosis not present

## 2018-06-21 DIAGNOSIS — D631 Anemia in chronic kidney disease: Secondary | ICD-10-CM | POA: Diagnosis not present

## 2018-06-21 DIAGNOSIS — N186 End stage renal disease: Secondary | ICD-10-CM | POA: Diagnosis not present

## 2018-06-21 DIAGNOSIS — N2581 Secondary hyperparathyroidism of renal origin: Secondary | ICD-10-CM | POA: Diagnosis not present

## 2018-06-21 DIAGNOSIS — E8809 Other disorders of plasma-protein metabolism, not elsewhere classified: Secondary | ICD-10-CM | POA: Diagnosis not present

## 2018-06-24 DIAGNOSIS — D500 Iron deficiency anemia secondary to blood loss (chronic): Secondary | ICD-10-CM | POA: Diagnosis not present

## 2018-06-24 DIAGNOSIS — N186 End stage renal disease: Secondary | ICD-10-CM | POA: Diagnosis not present

## 2018-06-24 DIAGNOSIS — E8809 Other disorders of plasma-protein metabolism, not elsewhere classified: Secondary | ICD-10-CM | POA: Diagnosis not present

## 2018-06-24 DIAGNOSIS — N2581 Secondary hyperparathyroidism of renal origin: Secondary | ICD-10-CM | POA: Diagnosis not present

## 2018-06-24 DIAGNOSIS — D631 Anemia in chronic kidney disease: Secondary | ICD-10-CM | POA: Diagnosis not present

## 2018-06-26 DIAGNOSIS — N2581 Secondary hyperparathyroidism of renal origin: Secondary | ICD-10-CM | POA: Diagnosis not present

## 2018-06-26 DIAGNOSIS — N186 End stage renal disease: Secondary | ICD-10-CM | POA: Diagnosis not present

## 2018-06-26 DIAGNOSIS — E8809 Other disorders of plasma-protein metabolism, not elsewhere classified: Secondary | ICD-10-CM | POA: Diagnosis not present

## 2018-06-26 DIAGNOSIS — D500 Iron deficiency anemia secondary to blood loss (chronic): Secondary | ICD-10-CM | POA: Diagnosis not present

## 2018-06-26 DIAGNOSIS — D631 Anemia in chronic kidney disease: Secondary | ICD-10-CM | POA: Diagnosis not present

## 2018-06-28 DIAGNOSIS — D631 Anemia in chronic kidney disease: Secondary | ICD-10-CM | POA: Diagnosis not present

## 2018-06-28 DIAGNOSIS — N186 End stage renal disease: Secondary | ICD-10-CM | POA: Diagnosis not present

## 2018-06-28 DIAGNOSIS — N2581 Secondary hyperparathyroidism of renal origin: Secondary | ICD-10-CM | POA: Diagnosis not present

## 2018-06-28 DIAGNOSIS — D500 Iron deficiency anemia secondary to blood loss (chronic): Secondary | ICD-10-CM | POA: Diagnosis not present

## 2018-06-28 DIAGNOSIS — E8809 Other disorders of plasma-protein metabolism, not elsewhere classified: Secondary | ICD-10-CM | POA: Diagnosis not present

## 2018-07-01 DIAGNOSIS — N2581 Secondary hyperparathyroidism of renal origin: Secondary | ICD-10-CM | POA: Diagnosis not present

## 2018-07-01 DIAGNOSIS — E8809 Other disorders of plasma-protein metabolism, not elsewhere classified: Secondary | ICD-10-CM | POA: Diagnosis not present

## 2018-07-01 DIAGNOSIS — D500 Iron deficiency anemia secondary to blood loss (chronic): Secondary | ICD-10-CM | POA: Diagnosis not present

## 2018-07-01 DIAGNOSIS — D631 Anemia in chronic kidney disease: Secondary | ICD-10-CM | POA: Diagnosis not present

## 2018-07-01 DIAGNOSIS — N186 End stage renal disease: Secondary | ICD-10-CM | POA: Diagnosis not present

## 2018-07-03 DIAGNOSIS — N186 End stage renal disease: Secondary | ICD-10-CM | POA: Diagnosis not present

## 2018-07-03 DIAGNOSIS — D631 Anemia in chronic kidney disease: Secondary | ICD-10-CM | POA: Diagnosis not present

## 2018-07-03 DIAGNOSIS — N2581 Secondary hyperparathyroidism of renal origin: Secondary | ICD-10-CM | POA: Diagnosis not present

## 2018-07-03 DIAGNOSIS — E8809 Other disorders of plasma-protein metabolism, not elsewhere classified: Secondary | ICD-10-CM | POA: Diagnosis not present

## 2018-07-03 DIAGNOSIS — D500 Iron deficiency anemia secondary to blood loss (chronic): Secondary | ICD-10-CM | POA: Diagnosis not present

## 2018-07-05 DIAGNOSIS — D500 Iron deficiency anemia secondary to blood loss (chronic): Secondary | ICD-10-CM | POA: Diagnosis not present

## 2018-07-05 DIAGNOSIS — D631 Anemia in chronic kidney disease: Secondary | ICD-10-CM | POA: Diagnosis not present

## 2018-07-05 DIAGNOSIS — E8809 Other disorders of plasma-protein metabolism, not elsewhere classified: Secondary | ICD-10-CM | POA: Diagnosis not present

## 2018-07-05 DIAGNOSIS — N186 End stage renal disease: Secondary | ICD-10-CM | POA: Diagnosis not present

## 2018-07-05 DIAGNOSIS — N2581 Secondary hyperparathyroidism of renal origin: Secondary | ICD-10-CM | POA: Diagnosis not present

## 2018-07-08 DIAGNOSIS — E8809 Other disorders of plasma-protein metabolism, not elsewhere classified: Secondary | ICD-10-CM | POA: Diagnosis not present

## 2018-07-08 DIAGNOSIS — D500 Iron deficiency anemia secondary to blood loss (chronic): Secondary | ICD-10-CM | POA: Diagnosis not present

## 2018-07-08 DIAGNOSIS — N2581 Secondary hyperparathyroidism of renal origin: Secondary | ICD-10-CM | POA: Diagnosis not present

## 2018-07-08 DIAGNOSIS — D631 Anemia in chronic kidney disease: Secondary | ICD-10-CM | POA: Diagnosis not present

## 2018-07-08 DIAGNOSIS — N186 End stage renal disease: Secondary | ICD-10-CM | POA: Diagnosis not present

## 2018-07-10 ENCOUNTER — Other Ambulatory Visit: Payer: Self-pay | Admitting: *Deleted

## 2018-07-10 ENCOUNTER — Telehealth: Payer: Self-pay | Admitting: Family Medicine

## 2018-07-10 DIAGNOSIS — N186 End stage renal disease: Secondary | ICD-10-CM | POA: Diagnosis not present

## 2018-07-10 DIAGNOSIS — N2581 Secondary hyperparathyroidism of renal origin: Secondary | ICD-10-CM | POA: Diagnosis not present

## 2018-07-10 DIAGNOSIS — D500 Iron deficiency anemia secondary to blood loss (chronic): Secondary | ICD-10-CM | POA: Diagnosis not present

## 2018-07-10 DIAGNOSIS — D631 Anemia in chronic kidney disease: Secondary | ICD-10-CM | POA: Diagnosis not present

## 2018-07-10 DIAGNOSIS — E8809 Other disorders of plasma-protein metabolism, not elsewhere classified: Secondary | ICD-10-CM | POA: Diagnosis not present

## 2018-07-10 MED ORDER — GLUCOSE BLOOD VI STRP: ORAL_STRIP | 3 refills | Status: DC

## 2018-07-10 NOTE — Telephone Encounter (Signed)
Copied from [place] [phone]. Topic: Quick Communication - See Telephone Encounter >> Jul 10, 2018  9:27 AM Conception Chancy, NT wrote: CRM for notification. See Telephone encounter for: 07/10/18.  Patient is calling and states he is needing a refill on his test strips, he states it was originally for the one touch brand but he would like the [employer] brand. He states his insurance will only cover 3x a day worth of strips with 90 day supply. Patient states he is completely out.  [place], [place] [address] Phone: [phone] Fax: [phone]

## 2018-07-10 NOTE — Telephone Encounter (Signed)
Rx sent to pharmacy as requested.

## 2018-07-12 DIAGNOSIS — E8809 Other disorders of plasma-protein metabolism, not elsewhere classified: Secondary | ICD-10-CM | POA: Diagnosis not present

## 2018-07-12 DIAGNOSIS — D500 Iron deficiency anemia secondary to blood loss (chronic): Secondary | ICD-10-CM | POA: Diagnosis not present

## 2018-07-12 DIAGNOSIS — N2581 Secondary hyperparathyroidism of renal origin: Secondary | ICD-10-CM | POA: Diagnosis not present

## 2018-07-12 DIAGNOSIS — D631 Anemia in chronic kidney disease: Secondary | ICD-10-CM | POA: Diagnosis not present

## 2018-07-12 DIAGNOSIS — N186 End stage renal disease: Secondary | ICD-10-CM | POA: Diagnosis not present

## 2018-07-14 DIAGNOSIS — N2581 Secondary hyperparathyroidism of renal origin: Secondary | ICD-10-CM | POA: Diagnosis not present

## 2018-07-14 DIAGNOSIS — E8809 Other disorders of plasma-protein metabolism, not elsewhere classified: Secondary | ICD-10-CM | POA: Diagnosis not present

## 2018-07-14 DIAGNOSIS — D500 Iron deficiency anemia secondary to blood loss (chronic): Secondary | ICD-10-CM | POA: Diagnosis not present

## 2018-07-14 DIAGNOSIS — N186 End stage renal disease: Secondary | ICD-10-CM | POA: Diagnosis not present

## 2018-07-14 DIAGNOSIS — D631 Anemia in chronic kidney disease: Secondary | ICD-10-CM | POA: Diagnosis not present

## 2018-07-16 DIAGNOSIS — D631 Anemia in chronic kidney disease: Secondary | ICD-10-CM | POA: Diagnosis not present

## 2018-07-16 DIAGNOSIS — N2581 Secondary hyperparathyroidism of renal origin: Secondary | ICD-10-CM | POA: Diagnosis not present

## 2018-07-16 DIAGNOSIS — E8809 Other disorders of plasma-protein metabolism, not elsewhere classified: Secondary | ICD-10-CM | POA: Diagnosis not present

## 2018-07-16 DIAGNOSIS — D500 Iron deficiency anemia secondary to blood loss (chronic): Secondary | ICD-10-CM | POA: Diagnosis not present

## 2018-07-16 DIAGNOSIS — N186 End stage renal disease: Secondary | ICD-10-CM | POA: Diagnosis not present

## 2018-07-19 DIAGNOSIS — D631 Anemia in chronic kidney disease: Secondary | ICD-10-CM | POA: Diagnosis not present

## 2018-07-19 DIAGNOSIS — E8809 Other disorders of plasma-protein metabolism, not elsewhere classified: Secondary | ICD-10-CM | POA: Diagnosis not present

## 2018-07-19 DIAGNOSIS — N2581 Secondary hyperparathyroidism of renal origin: Secondary | ICD-10-CM | POA: Diagnosis not present

## 2018-07-19 DIAGNOSIS — N186 End stage renal disease: Secondary | ICD-10-CM | POA: Diagnosis not present

## 2018-07-19 DIAGNOSIS — D500 Iron deficiency anemia secondary to blood loss (chronic): Secondary | ICD-10-CM | POA: Diagnosis not present

## 2018-07-20 DIAGNOSIS — N186 End stage renal disease: Secondary | ICD-10-CM | POA: Diagnosis not present

## 2018-07-20 DIAGNOSIS — Z992 Dependence on renal dialysis: Secondary | ICD-10-CM | POA: Diagnosis not present

## 2018-07-22 DIAGNOSIS — N2581 Secondary hyperparathyroidism of renal origin: Secondary | ICD-10-CM | POA: Diagnosis not present

## 2018-07-22 DIAGNOSIS — D6959 Other secondary thrombocytopenia: Secondary | ICD-10-CM | POA: Diagnosis not present

## 2018-07-22 DIAGNOSIS — D500 Iron deficiency anemia secondary to blood loss (chronic): Secondary | ICD-10-CM | POA: Diagnosis not present

## 2018-07-22 DIAGNOSIS — D631 Anemia in chronic kidney disease: Secondary | ICD-10-CM | POA: Diagnosis not present

## 2018-07-22 DIAGNOSIS — N186 End stage renal disease: Secondary | ICD-10-CM | POA: Diagnosis not present

## 2018-07-23 DIAGNOSIS — E1140 Type 2 diabetes mellitus with diabetic neuropathy, unspecified: Secondary | ICD-10-CM | POA: Diagnosis not present

## 2018-07-23 DIAGNOSIS — L600 Ingrowing nail: Secondary | ICD-10-CM | POA: Diagnosis not present

## 2018-07-23 DIAGNOSIS — Z992 Dependence on renal dialysis: Secondary | ICD-10-CM | POA: Diagnosis not present

## 2018-07-23 DIAGNOSIS — M2010 Hallux valgus (acquired), unspecified foot: Secondary | ICD-10-CM | POA: Diagnosis not present

## 2018-07-23 DIAGNOSIS — R7989 Other specified abnormal findings of blood chemistry: Secondary | ICD-10-CM | POA: Diagnosis not present

## 2018-07-23 DIAGNOSIS — B351 Tinea unguium: Secondary | ICD-10-CM | POA: Diagnosis not present

## 2018-07-24 DIAGNOSIS — N2581 Secondary hyperparathyroidism of renal origin: Secondary | ICD-10-CM | POA: Diagnosis not present

## 2018-07-24 DIAGNOSIS — N186 End stage renal disease: Secondary | ICD-10-CM | POA: Diagnosis not present

## 2018-07-24 DIAGNOSIS — D631 Anemia in chronic kidney disease: Secondary | ICD-10-CM | POA: Diagnosis not present

## 2018-07-24 DIAGNOSIS — D6959 Other secondary thrombocytopenia: Secondary | ICD-10-CM | POA: Diagnosis not present

## 2018-07-24 DIAGNOSIS — D500 Iron deficiency anemia secondary to blood loss (chronic): Secondary | ICD-10-CM | POA: Diagnosis not present

## 2018-07-26 DIAGNOSIS — N186 End stage renal disease: Secondary | ICD-10-CM | POA: Diagnosis not present

## 2018-07-26 DIAGNOSIS — D500 Iron deficiency anemia secondary to blood loss (chronic): Secondary | ICD-10-CM | POA: Diagnosis not present

## 2018-07-26 DIAGNOSIS — D6959 Other secondary thrombocytopenia: Secondary | ICD-10-CM | POA: Diagnosis not present

## 2018-07-26 DIAGNOSIS — N2581 Secondary hyperparathyroidism of renal origin: Secondary | ICD-10-CM | POA: Diagnosis not present

## 2018-07-26 DIAGNOSIS — D631 Anemia in chronic kidney disease: Secondary | ICD-10-CM | POA: Diagnosis not present

## 2018-07-29 DIAGNOSIS — D6959 Other secondary thrombocytopenia: Secondary | ICD-10-CM | POA: Diagnosis not present

## 2018-07-29 DIAGNOSIS — N2581 Secondary hyperparathyroidism of renal origin: Secondary | ICD-10-CM | POA: Diagnosis not present

## 2018-07-29 DIAGNOSIS — D500 Iron deficiency anemia secondary to blood loss (chronic): Secondary | ICD-10-CM | POA: Diagnosis not present

## 2018-07-29 DIAGNOSIS — N186 End stage renal disease: Secondary | ICD-10-CM | POA: Diagnosis not present

## 2018-07-29 DIAGNOSIS — D631 Anemia in chronic kidney disease: Secondary | ICD-10-CM | POA: Diagnosis not present

## 2018-07-31 DIAGNOSIS — E1129 Type 2 diabetes mellitus with other diabetic kidney complication: Secondary | ICD-10-CM | POA: Diagnosis not present

## 2018-07-31 DIAGNOSIS — N186 End stage renal disease: Secondary | ICD-10-CM | POA: Diagnosis not present

## 2018-07-31 DIAGNOSIS — D500 Iron deficiency anemia secondary to blood loss (chronic): Secondary | ICD-10-CM | POA: Diagnosis not present

## 2018-07-31 DIAGNOSIS — N2581 Secondary hyperparathyroidism of renal origin: Secondary | ICD-10-CM | POA: Diagnosis not present

## 2018-07-31 DIAGNOSIS — D631 Anemia in chronic kidney disease: Secondary | ICD-10-CM | POA: Diagnosis not present

## 2018-07-31 DIAGNOSIS — D6959 Other secondary thrombocytopenia: Secondary | ICD-10-CM | POA: Diagnosis not present

## 2018-08-02 DIAGNOSIS — D6959 Other secondary thrombocytopenia: Secondary | ICD-10-CM | POA: Diagnosis not present

## 2018-08-02 DIAGNOSIS — D500 Iron deficiency anemia secondary to blood loss (chronic): Secondary | ICD-10-CM | POA: Diagnosis not present

## 2018-08-02 DIAGNOSIS — N186 End stage renal disease: Secondary | ICD-10-CM | POA: Diagnosis not present

## 2018-08-02 DIAGNOSIS — D631 Anemia in chronic kidney disease: Secondary | ICD-10-CM | POA: Diagnosis not present

## 2018-08-02 DIAGNOSIS — N2581 Secondary hyperparathyroidism of renal origin: Secondary | ICD-10-CM | POA: Diagnosis not present

## 2018-08-05 DIAGNOSIS — D631 Anemia in chronic kidney disease: Secondary | ICD-10-CM | POA: Diagnosis not present

## 2018-08-05 DIAGNOSIS — N2581 Secondary hyperparathyroidism of renal origin: Secondary | ICD-10-CM | POA: Diagnosis not present

## 2018-08-05 DIAGNOSIS — D6959 Other secondary thrombocytopenia: Secondary | ICD-10-CM | POA: Diagnosis not present

## 2018-08-05 DIAGNOSIS — D500 Iron deficiency anemia secondary to blood loss (chronic): Secondary | ICD-10-CM | POA: Diagnosis not present

## 2018-08-05 DIAGNOSIS — N186 End stage renal disease: Secondary | ICD-10-CM | POA: Diagnosis not present

## 2018-08-06 DIAGNOSIS — E1140 Type 2 diabetes mellitus with diabetic neuropathy, unspecified: Secondary | ICD-10-CM | POA: Diagnosis not present

## 2018-08-06 DIAGNOSIS — L600 Ingrowing nail: Secondary | ICD-10-CM | POA: Diagnosis not present

## 2018-08-06 DIAGNOSIS — B351 Tinea unguium: Secondary | ICD-10-CM | POA: Diagnosis not present

## 2018-08-06 DIAGNOSIS — M79673 Pain in unspecified foot: Secondary | ICD-10-CM | POA: Diagnosis not present

## 2018-08-07 DIAGNOSIS — D6959 Other secondary thrombocytopenia: Secondary | ICD-10-CM | POA: Diagnosis not present

## 2018-08-07 DIAGNOSIS — N186 End stage renal disease: Secondary | ICD-10-CM | POA: Diagnosis not present

## 2018-08-07 DIAGNOSIS — N2581 Secondary hyperparathyroidism of renal origin: Secondary | ICD-10-CM | POA: Diagnosis not present

## 2018-08-07 DIAGNOSIS — D631 Anemia in chronic kidney disease: Secondary | ICD-10-CM | POA: Diagnosis not present

## 2018-08-07 DIAGNOSIS — D500 Iron deficiency anemia secondary to blood loss (chronic): Secondary | ICD-10-CM | POA: Diagnosis not present

## 2018-08-09 DIAGNOSIS — N2581 Secondary hyperparathyroidism of renal origin: Secondary | ICD-10-CM | POA: Diagnosis not present

## 2018-08-09 DIAGNOSIS — D631 Anemia in chronic kidney disease: Secondary | ICD-10-CM | POA: Diagnosis not present

## 2018-08-09 DIAGNOSIS — D500 Iron deficiency anemia secondary to blood loss (chronic): Secondary | ICD-10-CM | POA: Diagnosis not present

## 2018-08-09 DIAGNOSIS — N186 End stage renal disease: Secondary | ICD-10-CM | POA: Diagnosis not present

## 2018-08-09 DIAGNOSIS — D6959 Other secondary thrombocytopenia: Secondary | ICD-10-CM | POA: Diagnosis not present

## 2018-08-11 DIAGNOSIS — D6959 Other secondary thrombocytopenia: Secondary | ICD-10-CM | POA: Diagnosis not present

## 2018-08-11 DIAGNOSIS — N2581 Secondary hyperparathyroidism of renal origin: Secondary | ICD-10-CM | POA: Diagnosis not present

## 2018-08-11 DIAGNOSIS — D500 Iron deficiency anemia secondary to blood loss (chronic): Secondary | ICD-10-CM | POA: Diagnosis not present

## 2018-08-11 DIAGNOSIS — D631 Anemia in chronic kidney disease: Secondary | ICD-10-CM | POA: Diagnosis not present

## 2018-08-11 DIAGNOSIS — N186 End stage renal disease: Secondary | ICD-10-CM | POA: Diagnosis not present

## 2018-08-13 DIAGNOSIS — N2581 Secondary hyperparathyroidism of renal origin: Secondary | ICD-10-CM | POA: Diagnosis not present

## 2018-08-13 DIAGNOSIS — N186 End stage renal disease: Secondary | ICD-10-CM | POA: Diagnosis not present

## 2018-08-13 DIAGNOSIS — D500 Iron deficiency anemia secondary to blood loss (chronic): Secondary | ICD-10-CM | POA: Diagnosis not present

## 2018-08-13 DIAGNOSIS — D6959 Other secondary thrombocytopenia: Secondary | ICD-10-CM | POA: Diagnosis not present

## 2018-08-13 DIAGNOSIS — D631 Anemia in chronic kidney disease: Secondary | ICD-10-CM | POA: Diagnosis not present

## 2018-08-16 DIAGNOSIS — N2581 Secondary hyperparathyroidism of renal origin: Secondary | ICD-10-CM | POA: Diagnosis not present

## 2018-08-16 DIAGNOSIS — N186 End stage renal disease: Secondary | ICD-10-CM | POA: Diagnosis not present

## 2018-08-16 DIAGNOSIS — D500 Iron deficiency anemia secondary to blood loss (chronic): Secondary | ICD-10-CM | POA: Diagnosis not present

## 2018-08-16 DIAGNOSIS — D6959 Other secondary thrombocytopenia: Secondary | ICD-10-CM | POA: Diagnosis not present

## 2018-08-16 DIAGNOSIS — D631 Anemia in chronic kidney disease: Secondary | ICD-10-CM | POA: Diagnosis not present

## 2018-08-18 DIAGNOSIS — N2581 Secondary hyperparathyroidism of renal origin: Secondary | ICD-10-CM | POA: Diagnosis not present

## 2018-08-18 DIAGNOSIS — D631 Anemia in chronic kidney disease: Secondary | ICD-10-CM | POA: Diagnosis not present

## 2018-08-18 DIAGNOSIS — D500 Iron deficiency anemia secondary to blood loss (chronic): Secondary | ICD-10-CM | POA: Diagnosis not present

## 2018-08-18 DIAGNOSIS — N186 End stage renal disease: Secondary | ICD-10-CM | POA: Diagnosis not present

## 2018-08-18 DIAGNOSIS — D6959 Other secondary thrombocytopenia: Secondary | ICD-10-CM | POA: Diagnosis not present

## 2018-08-20 DIAGNOSIS — D6959 Other secondary thrombocytopenia: Secondary | ICD-10-CM | POA: Diagnosis not present

## 2018-08-20 DIAGNOSIS — N186 End stage renal disease: Secondary | ICD-10-CM | POA: Diagnosis not present

## 2018-08-20 DIAGNOSIS — D631 Anemia in chronic kidney disease: Secondary | ICD-10-CM | POA: Diagnosis not present

## 2018-08-20 DIAGNOSIS — N2581 Secondary hyperparathyroidism of renal origin: Secondary | ICD-10-CM | POA: Diagnosis not present

## 2018-08-20 DIAGNOSIS — D500 Iron deficiency anemia secondary to blood loss (chronic): Secondary | ICD-10-CM | POA: Diagnosis not present

## 2018-08-20 DIAGNOSIS — Z992 Dependence on renal dialysis: Secondary | ICD-10-CM | POA: Diagnosis not present

## 2018-08-23 DIAGNOSIS — D500 Iron deficiency anemia secondary to blood loss (chronic): Secondary | ICD-10-CM | POA: Diagnosis not present

## 2018-08-23 DIAGNOSIS — E8809 Other disorders of plasma-protein metabolism, not elsewhere classified: Secondary | ICD-10-CM | POA: Diagnosis not present

## 2018-08-23 DIAGNOSIS — E1129 Type 2 diabetes mellitus with other diabetic kidney complication: Secondary | ICD-10-CM | POA: Diagnosis not present

## 2018-08-23 DIAGNOSIS — N2581 Secondary hyperparathyroidism of renal origin: Secondary | ICD-10-CM | POA: Diagnosis not present

## 2018-08-23 DIAGNOSIS — N186 End stage renal disease: Secondary | ICD-10-CM | POA: Diagnosis not present

## 2018-08-23 DIAGNOSIS — D631 Anemia in chronic kidney disease: Secondary | ICD-10-CM | POA: Diagnosis not present

## 2018-08-26 DIAGNOSIS — D500 Iron deficiency anemia secondary to blood loss (chronic): Secondary | ICD-10-CM | POA: Diagnosis not present

## 2018-08-26 DIAGNOSIS — N2581 Secondary hyperparathyroidism of renal origin: Secondary | ICD-10-CM | POA: Diagnosis not present

## 2018-08-26 DIAGNOSIS — E8809 Other disorders of plasma-protein metabolism, not elsewhere classified: Secondary | ICD-10-CM | POA: Diagnosis not present

## 2018-08-26 DIAGNOSIS — N186 End stage renal disease: Secondary | ICD-10-CM | POA: Diagnosis not present

## 2018-08-26 DIAGNOSIS — E1129 Type 2 diabetes mellitus with other diabetic kidney complication: Secondary | ICD-10-CM | POA: Diagnosis not present

## 2018-08-26 DIAGNOSIS — D631 Anemia in chronic kidney disease: Secondary | ICD-10-CM | POA: Diagnosis not present

## 2018-08-28 DIAGNOSIS — D500 Iron deficiency anemia secondary to blood loss (chronic): Secondary | ICD-10-CM | POA: Diagnosis not present

## 2018-08-28 DIAGNOSIS — E1129 Type 2 diabetes mellitus with other diabetic kidney complication: Secondary | ICD-10-CM | POA: Diagnosis not present

## 2018-08-28 DIAGNOSIS — N2581 Secondary hyperparathyroidism of renal origin: Secondary | ICD-10-CM | POA: Diagnosis not present

## 2018-08-28 DIAGNOSIS — Z13220 Encounter for screening for lipoid disorders: Secondary | ICD-10-CM | POA: Diagnosis not present

## 2018-08-28 DIAGNOSIS — E8809 Other disorders of plasma-protein metabolism, not elsewhere classified: Secondary | ICD-10-CM | POA: Diagnosis not present

## 2018-08-28 DIAGNOSIS — N186 End stage renal disease: Secondary | ICD-10-CM | POA: Diagnosis not present

## 2018-08-28 DIAGNOSIS — D631 Anemia in chronic kidney disease: Secondary | ICD-10-CM | POA: Diagnosis not present

## 2018-08-30 DIAGNOSIS — N186 End stage renal disease: Secondary | ICD-10-CM | POA: Diagnosis not present

## 2018-08-30 DIAGNOSIS — D631 Anemia in chronic kidney disease: Secondary | ICD-10-CM | POA: Diagnosis not present

## 2018-08-30 DIAGNOSIS — E8809 Other disorders of plasma-protein metabolism, not elsewhere classified: Secondary | ICD-10-CM | POA: Diagnosis not present

## 2018-08-30 DIAGNOSIS — D500 Iron deficiency anemia secondary to blood loss (chronic): Secondary | ICD-10-CM | POA: Diagnosis not present

## 2018-08-30 DIAGNOSIS — E1129 Type 2 diabetes mellitus with other diabetic kidney complication: Secondary | ICD-10-CM | POA: Diagnosis not present

## 2018-08-30 DIAGNOSIS — N2581 Secondary hyperparathyroidism of renal origin: Secondary | ICD-10-CM | POA: Diagnosis not present

## 2018-09-02 DIAGNOSIS — D631 Anemia in chronic kidney disease: Secondary | ICD-10-CM | POA: Diagnosis not present

## 2018-09-02 DIAGNOSIS — E1129 Type 2 diabetes mellitus with other diabetic kidney complication: Secondary | ICD-10-CM | POA: Diagnosis not present

## 2018-09-02 DIAGNOSIS — N186 End stage renal disease: Secondary | ICD-10-CM | POA: Diagnosis not present

## 2018-09-02 DIAGNOSIS — G8929 Other chronic pain: Secondary | ICD-10-CM | POA: Diagnosis not present

## 2018-09-02 DIAGNOSIS — G4700 Insomnia, unspecified: Secondary | ICD-10-CM | POA: Diagnosis not present

## 2018-09-02 DIAGNOSIS — E8809 Other disorders of plasma-protein metabolism, not elsewhere classified: Secondary | ICD-10-CM | POA: Diagnosis not present

## 2018-09-02 DIAGNOSIS — N2581 Secondary hyperparathyroidism of renal origin: Secondary | ICD-10-CM | POA: Diagnosis not present

## 2018-09-02 DIAGNOSIS — D500 Iron deficiency anemia secondary to blood loss (chronic): Secondary | ICD-10-CM | POA: Diagnosis not present

## 2018-09-02 DIAGNOSIS — M47896 Other spondylosis, lumbar region: Secondary | ICD-10-CM | POA: Diagnosis not present

## 2018-09-02 DIAGNOSIS — Z79899 Other long term (current) drug therapy: Secondary | ICD-10-CM | POA: Diagnosis not present

## 2018-09-04 DIAGNOSIS — N186 End stage renal disease: Secondary | ICD-10-CM | POA: Diagnosis not present

## 2018-09-04 DIAGNOSIS — E8809 Other disorders of plasma-protein metabolism, not elsewhere classified: Secondary | ICD-10-CM | POA: Diagnosis not present

## 2018-09-04 DIAGNOSIS — N2581 Secondary hyperparathyroidism of renal origin: Secondary | ICD-10-CM | POA: Diagnosis not present

## 2018-09-04 DIAGNOSIS — D500 Iron deficiency anemia secondary to blood loss (chronic): Secondary | ICD-10-CM | POA: Diagnosis not present

## 2018-09-04 DIAGNOSIS — D631 Anemia in chronic kidney disease: Secondary | ICD-10-CM | POA: Diagnosis not present

## 2018-09-04 DIAGNOSIS — E1129 Type 2 diabetes mellitus with other diabetic kidney complication: Secondary | ICD-10-CM | POA: Diagnosis not present

## 2018-09-06 DIAGNOSIS — E1129 Type 2 diabetes mellitus with other diabetic kidney complication: Secondary | ICD-10-CM | POA: Diagnosis not present

## 2018-09-06 DIAGNOSIS — E8809 Other disorders of plasma-protein metabolism, not elsewhere classified: Secondary | ICD-10-CM | POA: Diagnosis not present

## 2018-09-06 DIAGNOSIS — D631 Anemia in chronic kidney disease: Secondary | ICD-10-CM | POA: Diagnosis not present

## 2018-09-06 DIAGNOSIS — N186 End stage renal disease: Secondary | ICD-10-CM | POA: Diagnosis not present

## 2018-09-06 DIAGNOSIS — N2581 Secondary hyperparathyroidism of renal origin: Secondary | ICD-10-CM | POA: Diagnosis not present

## 2018-09-06 DIAGNOSIS — D500 Iron deficiency anemia secondary to blood loss (chronic): Secondary | ICD-10-CM | POA: Diagnosis not present

## 2018-09-09 DIAGNOSIS — D631 Anemia in chronic kidney disease: Secondary | ICD-10-CM | POA: Diagnosis not present

## 2018-09-09 DIAGNOSIS — E1129 Type 2 diabetes mellitus with other diabetic kidney complication: Secondary | ICD-10-CM | POA: Diagnosis not present

## 2018-09-09 DIAGNOSIS — E8809 Other disorders of plasma-protein metabolism, not elsewhere classified: Secondary | ICD-10-CM | POA: Diagnosis not present

## 2018-09-09 DIAGNOSIS — N186 End stage renal disease: Secondary | ICD-10-CM | POA: Diagnosis not present

## 2018-09-09 DIAGNOSIS — N2581 Secondary hyperparathyroidism of renal origin: Secondary | ICD-10-CM | POA: Diagnosis not present

## 2018-09-09 DIAGNOSIS — D500 Iron deficiency anemia secondary to blood loss (chronic): Secondary | ICD-10-CM | POA: Diagnosis not present

## 2018-09-11 DIAGNOSIS — E8809 Other disorders of plasma-protein metabolism, not elsewhere classified: Secondary | ICD-10-CM | POA: Diagnosis not present

## 2018-09-11 DIAGNOSIS — N2581 Secondary hyperparathyroidism of renal origin: Secondary | ICD-10-CM | POA: Diagnosis not present

## 2018-09-11 DIAGNOSIS — D500 Iron deficiency anemia secondary to blood loss (chronic): Secondary | ICD-10-CM | POA: Diagnosis not present

## 2018-09-11 DIAGNOSIS — E1129 Type 2 diabetes mellitus with other diabetic kidney complication: Secondary | ICD-10-CM | POA: Diagnosis not present

## 2018-09-11 DIAGNOSIS — D631 Anemia in chronic kidney disease: Secondary | ICD-10-CM | POA: Diagnosis not present

## 2018-09-11 DIAGNOSIS — N186 End stage renal disease: Secondary | ICD-10-CM | POA: Diagnosis not present

## 2018-09-13 DIAGNOSIS — E1129 Type 2 diabetes mellitus with other diabetic kidney complication: Secondary | ICD-10-CM | POA: Diagnosis not present

## 2018-09-13 DIAGNOSIS — N2581 Secondary hyperparathyroidism of renal origin: Secondary | ICD-10-CM | POA: Diagnosis not present

## 2018-09-13 DIAGNOSIS — D500 Iron deficiency anemia secondary to blood loss (chronic): Secondary | ICD-10-CM | POA: Diagnosis not present

## 2018-09-13 DIAGNOSIS — D631 Anemia in chronic kidney disease: Secondary | ICD-10-CM | POA: Diagnosis not present

## 2018-09-13 DIAGNOSIS — E8809 Other disorders of plasma-protein metabolism, not elsewhere classified: Secondary | ICD-10-CM | POA: Diagnosis not present

## 2018-09-13 DIAGNOSIS — N186 End stage renal disease: Secondary | ICD-10-CM | POA: Diagnosis not present

## 2018-09-16 DIAGNOSIS — E1129 Type 2 diabetes mellitus with other diabetic kidney complication: Secondary | ICD-10-CM | POA: Diagnosis not present

## 2018-09-16 DIAGNOSIS — E8809 Other disorders of plasma-protein metabolism, not elsewhere classified: Secondary | ICD-10-CM | POA: Diagnosis not present

## 2018-09-16 DIAGNOSIS — D500 Iron deficiency anemia secondary to blood loss (chronic): Secondary | ICD-10-CM | POA: Diagnosis not present

## 2018-09-16 DIAGNOSIS — D631 Anemia in chronic kidney disease: Secondary | ICD-10-CM | POA: Diagnosis not present

## 2018-09-16 DIAGNOSIS — N2581 Secondary hyperparathyroidism of renal origin: Secondary | ICD-10-CM | POA: Diagnosis not present

## 2018-09-16 DIAGNOSIS — N186 End stage renal disease: Secondary | ICD-10-CM | POA: Diagnosis not present

## 2018-09-17 ENCOUNTER — Other Ambulatory Visit: Payer: Self-pay | Admitting: Family Medicine

## 2018-09-17 DIAGNOSIS — I10 Essential (primary) hypertension: Secondary | ICD-10-CM

## 2018-09-18 DIAGNOSIS — D500 Iron deficiency anemia secondary to blood loss (chronic): Secondary | ICD-10-CM | POA: Diagnosis not present

## 2018-09-18 DIAGNOSIS — E1129 Type 2 diabetes mellitus with other diabetic kidney complication: Secondary | ICD-10-CM | POA: Diagnosis not present

## 2018-09-18 DIAGNOSIS — N186 End stage renal disease: Secondary | ICD-10-CM | POA: Diagnosis not present

## 2018-09-18 DIAGNOSIS — N2581 Secondary hyperparathyroidism of renal origin: Secondary | ICD-10-CM | POA: Diagnosis not present

## 2018-09-18 DIAGNOSIS — E8809 Other disorders of plasma-protein metabolism, not elsewhere classified: Secondary | ICD-10-CM | POA: Diagnosis not present

## 2018-09-18 DIAGNOSIS — D631 Anemia in chronic kidney disease: Secondary | ICD-10-CM | POA: Diagnosis not present

## 2018-09-20 DIAGNOSIS — Z992 Dependence on renal dialysis: Secondary | ICD-10-CM | POA: Diagnosis not present

## 2018-09-20 DIAGNOSIS — N186 End stage renal disease: Secondary | ICD-10-CM | POA: Diagnosis not present

## 2018-09-20 DIAGNOSIS — E1129 Type 2 diabetes mellitus with other diabetic kidney complication: Secondary | ICD-10-CM | POA: Diagnosis not present

## 2018-09-20 DIAGNOSIS — E8809 Other disorders of plasma-protein metabolism, not elsewhere classified: Secondary | ICD-10-CM | POA: Diagnosis not present

## 2018-09-20 DIAGNOSIS — D500 Iron deficiency anemia secondary to blood loss (chronic): Secondary | ICD-10-CM | POA: Diagnosis not present

## 2018-09-20 DIAGNOSIS — D631 Anemia in chronic kidney disease: Secondary | ICD-10-CM | POA: Diagnosis not present

## 2018-09-20 DIAGNOSIS — N2581 Secondary hyperparathyroidism of renal origin: Secondary | ICD-10-CM | POA: Diagnosis not present

## 2018-09-23 DIAGNOSIS — E1129 Type 2 diabetes mellitus with other diabetic kidney complication: Secondary | ICD-10-CM | POA: Diagnosis not present

## 2018-09-23 DIAGNOSIS — N2581 Secondary hyperparathyroidism of renal origin: Secondary | ICD-10-CM | POA: Diagnosis not present

## 2018-09-23 DIAGNOSIS — N186 End stage renal disease: Secondary | ICD-10-CM | POA: Diagnosis not present

## 2018-09-23 DIAGNOSIS — E8809 Other disorders of plasma-protein metabolism, not elsewhere classified: Secondary | ICD-10-CM | POA: Diagnosis not present

## 2018-09-23 DIAGNOSIS — Z23 Encounter for immunization: Secondary | ICD-10-CM | POA: Diagnosis not present

## 2018-09-23 DIAGNOSIS — D500 Iron deficiency anemia secondary to blood loss (chronic): Secondary | ICD-10-CM | POA: Diagnosis not present

## 2018-09-23 DIAGNOSIS — D631 Anemia in chronic kidney disease: Secondary | ICD-10-CM | POA: Diagnosis not present

## 2018-09-25 DIAGNOSIS — N186 End stage renal disease: Secondary | ICD-10-CM | POA: Diagnosis not present

## 2018-09-25 DIAGNOSIS — D500 Iron deficiency anemia secondary to blood loss (chronic): Secondary | ICD-10-CM | POA: Diagnosis not present

## 2018-09-25 DIAGNOSIS — D631 Anemia in chronic kidney disease: Secondary | ICD-10-CM | POA: Diagnosis not present

## 2018-09-25 DIAGNOSIS — E1129 Type 2 diabetes mellitus with other diabetic kidney complication: Secondary | ICD-10-CM | POA: Diagnosis not present

## 2018-09-25 DIAGNOSIS — N2581 Secondary hyperparathyroidism of renal origin: Secondary | ICD-10-CM | POA: Diagnosis not present

## 2018-09-27 DIAGNOSIS — N186 End stage renal disease: Secondary | ICD-10-CM | POA: Diagnosis not present

## 2018-09-27 DIAGNOSIS — E1129 Type 2 diabetes mellitus with other diabetic kidney complication: Secondary | ICD-10-CM | POA: Diagnosis not present

## 2018-09-27 DIAGNOSIS — D631 Anemia in chronic kidney disease: Secondary | ICD-10-CM | POA: Diagnosis not present

## 2018-09-27 DIAGNOSIS — D500 Iron deficiency anemia secondary to blood loss (chronic): Secondary | ICD-10-CM | POA: Diagnosis not present

## 2018-09-27 DIAGNOSIS — N2581 Secondary hyperparathyroidism of renal origin: Secondary | ICD-10-CM | POA: Diagnosis not present

## 2018-09-30 DIAGNOSIS — E1129 Type 2 diabetes mellitus with other diabetic kidney complication: Secondary | ICD-10-CM | POA: Diagnosis not present

## 2018-09-30 DIAGNOSIS — N186 End stage renal disease: Secondary | ICD-10-CM | POA: Diagnosis not present

## 2018-09-30 DIAGNOSIS — D631 Anemia in chronic kidney disease: Secondary | ICD-10-CM | POA: Diagnosis not present

## 2018-09-30 DIAGNOSIS — D500 Iron deficiency anemia secondary to blood loss (chronic): Secondary | ICD-10-CM | POA: Diagnosis not present

## 2018-09-30 DIAGNOSIS — N2581 Secondary hyperparathyroidism of renal origin: Secondary | ICD-10-CM | POA: Diagnosis not present

## 2018-10-02 DIAGNOSIS — D500 Iron deficiency anemia secondary to blood loss (chronic): Secondary | ICD-10-CM | POA: Diagnosis not present

## 2018-10-02 DIAGNOSIS — N186 End stage renal disease: Secondary | ICD-10-CM | POA: Diagnosis not present

## 2018-10-02 DIAGNOSIS — E1129 Type 2 diabetes mellitus with other diabetic kidney complication: Secondary | ICD-10-CM | POA: Diagnosis not present

## 2018-10-02 DIAGNOSIS — N2581 Secondary hyperparathyroidism of renal origin: Secondary | ICD-10-CM | POA: Diagnosis not present

## 2018-10-02 DIAGNOSIS — D631 Anemia in chronic kidney disease: Secondary | ICD-10-CM | POA: Diagnosis not present

## 2018-10-04 DIAGNOSIS — D500 Iron deficiency anemia secondary to blood loss (chronic): Secondary | ICD-10-CM | POA: Diagnosis not present

## 2018-10-04 DIAGNOSIS — N186 End stage renal disease: Secondary | ICD-10-CM | POA: Diagnosis not present

## 2018-10-04 DIAGNOSIS — N2581 Secondary hyperparathyroidism of renal origin: Secondary | ICD-10-CM | POA: Diagnosis not present

## 2018-10-04 DIAGNOSIS — E1129 Type 2 diabetes mellitus with other diabetic kidney complication: Secondary | ICD-10-CM | POA: Diagnosis not present

## 2018-10-04 DIAGNOSIS — D631 Anemia in chronic kidney disease: Secondary | ICD-10-CM | POA: Diagnosis not present

## 2018-10-07 DIAGNOSIS — E1129 Type 2 diabetes mellitus with other diabetic kidney complication: Secondary | ICD-10-CM | POA: Diagnosis not present

## 2018-10-07 DIAGNOSIS — N2581 Secondary hyperparathyroidism of renal origin: Secondary | ICD-10-CM | POA: Diagnosis not present

## 2018-10-07 DIAGNOSIS — N186 End stage renal disease: Secondary | ICD-10-CM | POA: Diagnosis not present

## 2018-10-07 DIAGNOSIS — D631 Anemia in chronic kidney disease: Secondary | ICD-10-CM | POA: Diagnosis not present

## 2018-10-07 DIAGNOSIS — D500 Iron deficiency anemia secondary to blood loss (chronic): Secondary | ICD-10-CM | POA: Diagnosis not present

## 2018-10-09 DIAGNOSIS — D500 Iron deficiency anemia secondary to blood loss (chronic): Secondary | ICD-10-CM | POA: Diagnosis not present

## 2018-10-09 DIAGNOSIS — N186 End stage renal disease: Secondary | ICD-10-CM | POA: Diagnosis not present

## 2018-10-09 DIAGNOSIS — N2581 Secondary hyperparathyroidism of renal origin: Secondary | ICD-10-CM | POA: Diagnosis not present

## 2018-10-09 DIAGNOSIS — E1129 Type 2 diabetes mellitus with other diabetic kidney complication: Secondary | ICD-10-CM | POA: Diagnosis not present

## 2018-10-09 DIAGNOSIS — D631 Anemia in chronic kidney disease: Secondary | ICD-10-CM | POA: Diagnosis not present

## 2018-10-11 DIAGNOSIS — D500 Iron deficiency anemia secondary to blood loss (chronic): Secondary | ICD-10-CM | POA: Diagnosis not present

## 2018-10-11 DIAGNOSIS — N186 End stage renal disease: Secondary | ICD-10-CM | POA: Diagnosis not present

## 2018-10-11 DIAGNOSIS — D631 Anemia in chronic kidney disease: Secondary | ICD-10-CM | POA: Diagnosis not present

## 2018-10-11 DIAGNOSIS — E1129 Type 2 diabetes mellitus with other diabetic kidney complication: Secondary | ICD-10-CM | POA: Diagnosis not present

## 2018-10-11 DIAGNOSIS — N2581 Secondary hyperparathyroidism of renal origin: Secondary | ICD-10-CM | POA: Diagnosis not present

## 2018-10-14 DIAGNOSIS — N186 End stage renal disease: Secondary | ICD-10-CM | POA: Diagnosis not present

## 2018-10-14 DIAGNOSIS — N2581 Secondary hyperparathyroidism of renal origin: Secondary | ICD-10-CM | POA: Diagnosis not present

## 2018-10-14 DIAGNOSIS — E1129 Type 2 diabetes mellitus with other diabetic kidney complication: Secondary | ICD-10-CM | POA: Diagnosis not present

## 2018-10-14 DIAGNOSIS — D500 Iron deficiency anemia secondary to blood loss (chronic): Secondary | ICD-10-CM | POA: Diagnosis not present

## 2018-10-14 DIAGNOSIS — D631 Anemia in chronic kidney disease: Secondary | ICD-10-CM | POA: Diagnosis not present

## 2018-10-16 DIAGNOSIS — N186 End stage renal disease: Secondary | ICD-10-CM | POA: Diagnosis not present

## 2018-10-16 DIAGNOSIS — N2581 Secondary hyperparathyroidism of renal origin: Secondary | ICD-10-CM | POA: Diagnosis not present

## 2018-10-16 DIAGNOSIS — D500 Iron deficiency anemia secondary to blood loss (chronic): Secondary | ICD-10-CM | POA: Diagnosis not present

## 2018-10-16 DIAGNOSIS — E1129 Type 2 diabetes mellitus with other diabetic kidney complication: Secondary | ICD-10-CM | POA: Diagnosis not present

## 2018-10-16 DIAGNOSIS — D631 Anemia in chronic kidney disease: Secondary | ICD-10-CM | POA: Diagnosis not present

## 2018-10-18 DIAGNOSIS — D631 Anemia in chronic kidney disease: Secondary | ICD-10-CM | POA: Diagnosis not present

## 2018-10-18 DIAGNOSIS — N186 End stage renal disease: Secondary | ICD-10-CM | POA: Diagnosis not present

## 2018-10-18 DIAGNOSIS — D500 Iron deficiency anemia secondary to blood loss (chronic): Secondary | ICD-10-CM | POA: Diagnosis not present

## 2018-10-18 DIAGNOSIS — N2581 Secondary hyperparathyroidism of renal origin: Secondary | ICD-10-CM | POA: Diagnosis not present

## 2018-10-18 DIAGNOSIS — E1129 Type 2 diabetes mellitus with other diabetic kidney complication: Secondary | ICD-10-CM | POA: Diagnosis not present

## 2018-10-19 DIAGNOSIS — N186 End stage renal disease: Secondary | ICD-10-CM | POA: Diagnosis not present

## 2018-10-19 DIAGNOSIS — Z992 Dependence on renal dialysis: Secondary | ICD-10-CM | POA: Diagnosis not present

## 2018-10-21 DIAGNOSIS — D6959 Other secondary thrombocytopenia: Secondary | ICD-10-CM | POA: Diagnosis not present

## 2018-10-21 DIAGNOSIS — D500 Iron deficiency anemia secondary to blood loss (chronic): Secondary | ICD-10-CM | POA: Diagnosis not present

## 2018-10-21 DIAGNOSIS — N2581 Secondary hyperparathyroidism of renal origin: Secondary | ICD-10-CM | POA: Diagnosis not present

## 2018-10-21 DIAGNOSIS — D631 Anemia in chronic kidney disease: Secondary | ICD-10-CM | POA: Diagnosis not present

## 2018-10-21 DIAGNOSIS — N186 End stage renal disease: Secondary | ICD-10-CM | POA: Diagnosis not present

## 2018-10-23 DIAGNOSIS — D6959 Other secondary thrombocytopenia: Secondary | ICD-10-CM | POA: Diagnosis not present

## 2018-10-23 DIAGNOSIS — D631 Anemia in chronic kidney disease: Secondary | ICD-10-CM | POA: Diagnosis not present

## 2018-10-23 DIAGNOSIS — N2581 Secondary hyperparathyroidism of renal origin: Secondary | ICD-10-CM | POA: Diagnosis not present

## 2018-10-23 DIAGNOSIS — D500 Iron deficiency anemia secondary to blood loss (chronic): Secondary | ICD-10-CM | POA: Diagnosis not present

## 2018-10-23 DIAGNOSIS — N186 End stage renal disease: Secondary | ICD-10-CM | POA: Diagnosis not present

## 2018-10-25 DIAGNOSIS — N186 End stage renal disease: Secondary | ICD-10-CM | POA: Diagnosis not present

## 2018-10-25 DIAGNOSIS — D631 Anemia in chronic kidney disease: Secondary | ICD-10-CM | POA: Diagnosis not present

## 2018-10-25 DIAGNOSIS — D6959 Other secondary thrombocytopenia: Secondary | ICD-10-CM | POA: Diagnosis not present

## 2018-10-25 DIAGNOSIS — D500 Iron deficiency anemia secondary to blood loss (chronic): Secondary | ICD-10-CM | POA: Diagnosis not present

## 2018-10-25 DIAGNOSIS — N2581 Secondary hyperparathyroidism of renal origin: Secondary | ICD-10-CM | POA: Diagnosis not present

## 2018-10-28 DIAGNOSIS — D500 Iron deficiency anemia secondary to blood loss (chronic): Secondary | ICD-10-CM | POA: Diagnosis not present

## 2018-10-28 DIAGNOSIS — D631 Anemia in chronic kidney disease: Secondary | ICD-10-CM | POA: Diagnosis not present

## 2018-10-28 DIAGNOSIS — D6959 Other secondary thrombocytopenia: Secondary | ICD-10-CM | POA: Diagnosis not present

## 2018-10-28 DIAGNOSIS — N186 End stage renal disease: Secondary | ICD-10-CM | POA: Diagnosis not present

## 2018-10-28 DIAGNOSIS — N2581 Secondary hyperparathyroidism of renal origin: Secondary | ICD-10-CM | POA: Diagnosis not present

## 2018-10-30 DIAGNOSIS — D6959 Other secondary thrombocytopenia: Secondary | ICD-10-CM | POA: Diagnosis not present

## 2018-10-30 DIAGNOSIS — D500 Iron deficiency anemia secondary to blood loss (chronic): Secondary | ICD-10-CM | POA: Diagnosis not present

## 2018-10-30 DIAGNOSIS — N186 End stage renal disease: Secondary | ICD-10-CM | POA: Diagnosis not present

## 2018-10-30 DIAGNOSIS — D631 Anemia in chronic kidney disease: Secondary | ICD-10-CM | POA: Diagnosis not present

## 2018-10-30 DIAGNOSIS — E1129 Type 2 diabetes mellitus with other diabetic kidney complication: Secondary | ICD-10-CM | POA: Diagnosis not present

## 2018-10-30 DIAGNOSIS — N2581 Secondary hyperparathyroidism of renal origin: Secondary | ICD-10-CM | POA: Diagnosis not present

## 2018-10-31 DIAGNOSIS — I10 Essential (primary) hypertension: Secondary | ICD-10-CM | POA: Diagnosis not present

## 2018-10-31 DIAGNOSIS — I872 Venous insufficiency (chronic) (peripheral): Secondary | ICD-10-CM | POA: Diagnosis not present

## 2018-10-31 DIAGNOSIS — I2510 Atherosclerotic heart disease of native coronary artery without angina pectoris: Secondary | ICD-10-CM | POA: Diagnosis not present

## 2018-10-31 DIAGNOSIS — E785 Hyperlipidemia, unspecified: Secondary | ICD-10-CM | POA: Diagnosis not present

## 2018-10-31 DIAGNOSIS — N186 End stage renal disease: Secondary | ICD-10-CM | POA: Diagnosis not present

## 2018-11-01 DIAGNOSIS — D500 Iron deficiency anemia secondary to blood loss (chronic): Secondary | ICD-10-CM | POA: Diagnosis not present

## 2018-11-01 DIAGNOSIS — D6959 Other secondary thrombocytopenia: Secondary | ICD-10-CM | POA: Diagnosis not present

## 2018-11-01 DIAGNOSIS — N186 End stage renal disease: Secondary | ICD-10-CM | POA: Diagnosis not present

## 2018-11-01 DIAGNOSIS — R000 Tachycardia, unspecified: Secondary | ICD-10-CM | POA: Diagnosis not present

## 2018-11-01 DIAGNOSIS — N2581 Secondary hyperparathyroidism of renal origin: Secondary | ICD-10-CM | POA: Diagnosis not present

## 2018-11-01 DIAGNOSIS — D631 Anemia in chronic kidney disease: Secondary | ICD-10-CM | POA: Diagnosis not present

## 2018-11-04 DIAGNOSIS — N186 End stage renal disease: Secondary | ICD-10-CM | POA: Diagnosis not present

## 2018-11-04 DIAGNOSIS — D500 Iron deficiency anemia secondary to blood loss (chronic): Secondary | ICD-10-CM | POA: Diagnosis not present

## 2018-11-04 DIAGNOSIS — D6959 Other secondary thrombocytopenia: Secondary | ICD-10-CM | POA: Diagnosis not present

## 2018-11-04 DIAGNOSIS — N2581 Secondary hyperparathyroidism of renal origin: Secondary | ICD-10-CM | POA: Diagnosis not present

## 2018-11-04 DIAGNOSIS — D631 Anemia in chronic kidney disease: Secondary | ICD-10-CM | POA: Diagnosis not present

## 2018-11-05 DIAGNOSIS — E1140 Type 2 diabetes mellitus with diabetic neuropathy, unspecified: Secondary | ICD-10-CM | POA: Diagnosis not present

## 2018-11-05 DIAGNOSIS — Z992 Dependence on renal dialysis: Secondary | ICD-10-CM | POA: Diagnosis not present

## 2018-11-05 DIAGNOSIS — L600 Ingrowing nail: Secondary | ICD-10-CM | POA: Diagnosis not present

## 2018-11-05 DIAGNOSIS — M2010 Hallux valgus (acquired), unspecified foot: Secondary | ICD-10-CM | POA: Diagnosis not present

## 2018-11-05 DIAGNOSIS — M79673 Pain in unspecified foot: Secondary | ICD-10-CM | POA: Diagnosis not present

## 2018-11-06 DIAGNOSIS — N186 End stage renal disease: Secondary | ICD-10-CM | POA: Diagnosis not present

## 2018-11-06 DIAGNOSIS — D500 Iron deficiency anemia secondary to blood loss (chronic): Secondary | ICD-10-CM | POA: Diagnosis not present

## 2018-11-06 DIAGNOSIS — D6959 Other secondary thrombocytopenia: Secondary | ICD-10-CM | POA: Diagnosis not present

## 2018-11-06 DIAGNOSIS — D631 Anemia in chronic kidney disease: Secondary | ICD-10-CM | POA: Diagnosis not present

## 2018-11-06 DIAGNOSIS — N2581 Secondary hyperparathyroidism of renal origin: Secondary | ICD-10-CM | POA: Diagnosis not present

## 2018-11-08 DIAGNOSIS — D631 Anemia in chronic kidney disease: Secondary | ICD-10-CM | POA: Diagnosis not present

## 2018-11-08 DIAGNOSIS — D500 Iron deficiency anemia secondary to blood loss (chronic): Secondary | ICD-10-CM | POA: Diagnosis not present

## 2018-11-08 DIAGNOSIS — N186 End stage renal disease: Secondary | ICD-10-CM | POA: Diagnosis not present

## 2018-11-08 DIAGNOSIS — N2581 Secondary hyperparathyroidism of renal origin: Secondary | ICD-10-CM | POA: Diagnosis not present

## 2018-11-08 DIAGNOSIS — D6959 Other secondary thrombocytopenia: Secondary | ICD-10-CM | POA: Diagnosis not present

## 2018-11-11 DIAGNOSIS — M47896 Other spondylosis, lumbar region: Secondary | ICD-10-CM | POA: Diagnosis not present

## 2018-11-11 DIAGNOSIS — D500 Iron deficiency anemia secondary to blood loss (chronic): Secondary | ICD-10-CM | POA: Diagnosis not present

## 2018-11-11 DIAGNOSIS — N2581 Secondary hyperparathyroidism of renal origin: Secondary | ICD-10-CM | POA: Diagnosis not present

## 2018-11-11 DIAGNOSIS — G4700 Insomnia, unspecified: Secondary | ICD-10-CM | POA: Diagnosis not present

## 2018-11-11 DIAGNOSIS — N186 End stage renal disease: Secondary | ICD-10-CM | POA: Diagnosis not present

## 2018-11-11 DIAGNOSIS — Z79899 Other long term (current) drug therapy: Secondary | ICD-10-CM | POA: Diagnosis not present

## 2018-11-11 DIAGNOSIS — G8929 Other chronic pain: Secondary | ICD-10-CM | POA: Diagnosis not present

## 2018-11-11 DIAGNOSIS — D6959 Other secondary thrombocytopenia: Secondary | ICD-10-CM | POA: Diagnosis not present

## 2018-11-11 DIAGNOSIS — D631 Anemia in chronic kidney disease: Secondary | ICD-10-CM | POA: Diagnosis not present

## 2018-11-13 DIAGNOSIS — D6959 Other secondary thrombocytopenia: Secondary | ICD-10-CM | POA: Diagnosis not present

## 2018-11-13 DIAGNOSIS — D631 Anemia in chronic kidney disease: Secondary | ICD-10-CM | POA: Diagnosis not present

## 2018-11-13 DIAGNOSIS — N186 End stage renal disease: Secondary | ICD-10-CM | POA: Diagnosis not present

## 2018-11-13 DIAGNOSIS — D500 Iron deficiency anemia secondary to blood loss (chronic): Secondary | ICD-10-CM | POA: Diagnosis not present

## 2018-11-13 DIAGNOSIS — N2581 Secondary hyperparathyroidism of renal origin: Secondary | ICD-10-CM | POA: Diagnosis not present

## 2018-11-15 DIAGNOSIS — N186 End stage renal disease: Secondary | ICD-10-CM | POA: Diagnosis not present

## 2018-11-15 DIAGNOSIS — D500 Iron deficiency anemia secondary to blood loss (chronic): Secondary | ICD-10-CM | POA: Diagnosis not present

## 2018-11-15 DIAGNOSIS — D631 Anemia in chronic kidney disease: Secondary | ICD-10-CM | POA: Diagnosis not present

## 2018-11-15 DIAGNOSIS — N2581 Secondary hyperparathyroidism of renal origin: Secondary | ICD-10-CM | POA: Diagnosis not present

## 2018-11-15 DIAGNOSIS — D6959 Other secondary thrombocytopenia: Secondary | ICD-10-CM | POA: Diagnosis not present

## 2018-11-18 DIAGNOSIS — D6959 Other secondary thrombocytopenia: Secondary | ICD-10-CM | POA: Diagnosis not present

## 2018-11-18 DIAGNOSIS — D500 Iron deficiency anemia secondary to blood loss (chronic): Secondary | ICD-10-CM | POA: Diagnosis not present

## 2018-11-18 DIAGNOSIS — N186 End stage renal disease: Secondary | ICD-10-CM | POA: Diagnosis not present

## 2018-11-18 DIAGNOSIS — D631 Anemia in chronic kidney disease: Secondary | ICD-10-CM | POA: Diagnosis not present

## 2018-11-18 DIAGNOSIS — N2581 Secondary hyperparathyroidism of renal origin: Secondary | ICD-10-CM | POA: Diagnosis not present

## 2018-11-19 DIAGNOSIS — R000 Tachycardia, unspecified: Secondary | ICD-10-CM | POA: Diagnosis not present

## 2018-11-19 DIAGNOSIS — N186 End stage renal disease: Secondary | ICD-10-CM | POA: Diagnosis not present

## 2018-11-19 DIAGNOSIS — I4891 Unspecified atrial fibrillation: Secondary | ICD-10-CM | POA: Diagnosis not present

## 2018-11-19 DIAGNOSIS — Z992 Dependence on renal dialysis: Secondary | ICD-10-CM | POA: Diagnosis not present

## 2018-11-20 DIAGNOSIS — D500 Iron deficiency anemia secondary to blood loss (chronic): Secondary | ICD-10-CM | POA: Diagnosis not present

## 2018-11-20 DIAGNOSIS — N2581 Secondary hyperparathyroidism of renal origin: Secondary | ICD-10-CM | POA: Diagnosis not present

## 2018-11-20 DIAGNOSIS — E8809 Other disorders of plasma-protein metabolism, not elsewhere classified: Secondary | ICD-10-CM | POA: Diagnosis not present

## 2018-11-20 DIAGNOSIS — D631 Anemia in chronic kidney disease: Secondary | ICD-10-CM | POA: Diagnosis not present

## 2018-11-20 DIAGNOSIS — N186 End stage renal disease: Secondary | ICD-10-CM | POA: Diagnosis not present

## 2018-11-22 ENCOUNTER — Telehealth: Payer: Self-pay

## 2018-11-22 DIAGNOSIS — N2581 Secondary hyperparathyroidism of renal origin: Secondary | ICD-10-CM | POA: Diagnosis not present

## 2018-11-22 DIAGNOSIS — D500 Iron deficiency anemia secondary to blood loss (chronic): Secondary | ICD-10-CM | POA: Diagnosis not present

## 2018-11-22 DIAGNOSIS — E8809 Other disorders of plasma-protein metabolism, not elsewhere classified: Secondary | ICD-10-CM | POA: Diagnosis not present

## 2018-11-22 DIAGNOSIS — D631 Anemia in chronic kidney disease: Secondary | ICD-10-CM | POA: Diagnosis not present

## 2018-11-22 DIAGNOSIS — N186 End stage renal disease: Secondary | ICD-10-CM | POA: Diagnosis not present

## 2018-11-22 NOTE — Telephone Encounter (Signed)
Webex scheduled.

## 2018-11-22 NOTE — Telephone Encounter (Signed)
Resent email invite to patient. Patient stated that he is not tech savvy and said he would try to do webex visit, but if not, could he just do facetime.

## 2018-11-22 NOTE — Telephone Encounter (Signed)
Face time and Xoom cannot be done at this time. Webex is preferable for me. If NOT at all possible tele visit is the other option.  Thanks, BJ

## 2018-11-22 NOTE — Telephone Encounter (Signed)
Author phoned pt. to assess interest in scheduling virtual awv. Pt. Stated he would, but he is already seeing Dr. Martinique 4/7 virtually. Pt .agreed to do in-office awv 8/19 at 4PM with HC. Pt. wanted Dr. Martinique to know that for his 4/7 visit, he would much rather do facetime, and stated he did not receive webex invite. Routed to Dr. Martinique and [place].

## 2018-11-25 DIAGNOSIS — N186 End stage renal disease: Secondary | ICD-10-CM | POA: Diagnosis not present

## 2018-11-25 DIAGNOSIS — D500 Iron deficiency anemia secondary to blood loss (chronic): Secondary | ICD-10-CM | POA: Diagnosis not present

## 2018-11-25 DIAGNOSIS — D631 Anemia in chronic kidney disease: Secondary | ICD-10-CM | POA: Diagnosis not present

## 2018-11-25 DIAGNOSIS — E8809 Other disorders of plasma-protein metabolism, not elsewhere classified: Secondary | ICD-10-CM | POA: Diagnosis not present

## 2018-11-25 DIAGNOSIS — N2581 Secondary hyperparathyroidism of renal origin: Secondary | ICD-10-CM | POA: Diagnosis not present

## 2018-11-26 ENCOUNTER — Other Ambulatory Visit: Payer: Self-pay

## 2018-11-26 ENCOUNTER — Ambulatory Visit (INDEPENDENT_AMBULATORY_CARE_PROVIDER_SITE_OTHER): Payer: Medicare Other | Admitting: Family Medicine

## 2018-11-26 ENCOUNTER — Encounter: Payer: Self-pay | Admitting: Family Medicine

## 2018-11-26 VITALS — BP 114/45 | HR 65 | Resp 16

## 2018-11-26 DIAGNOSIS — I10 Essential (primary) hypertension: Secondary | ICD-10-CM | POA: Diagnosis not present

## 2018-11-26 DIAGNOSIS — N184 Chronic kidney disease, stage 4 (severe): Secondary | ICD-10-CM

## 2018-11-26 DIAGNOSIS — E785 Hyperlipidemia, unspecified: Secondary | ICD-10-CM | POA: Diagnosis not present

## 2018-11-26 DIAGNOSIS — N186 End stage renal disease: Secondary | ICD-10-CM | POA: Diagnosis not present

## 2018-11-26 DIAGNOSIS — Z992 Dependence on renal dialysis: Secondary | ICD-10-CM

## 2018-11-26 DIAGNOSIS — E1142 Type 2 diabetes mellitus with diabetic polyneuropathy: Secondary | ICD-10-CM | POA: Diagnosis not present

## 2018-11-26 DIAGNOSIS — E1122 Type 2 diabetes mellitus with diabetic chronic kidney disease: Secondary | ICD-10-CM | POA: Diagnosis not present

## 2018-11-26 NOTE — Assessment & Plan Note (Signed)
Based on reported BS's, problem seems to be adequately controlled. Caution with hypoglycemia. No changes in current management. Continue following dietary recommendations. We will plan on checking A1c and fructosamine next visit.

## 2018-11-26 NOTE — Assessment & Plan Note (Signed)
BP today mildly low. For now no changes in current management given the fact he is not having symptoms that indicate hypotension. Continue low-salt diet. He has BP check every 3 times per week before and after dialysis.

## 2018-11-26 NOTE — Progress Notes (Signed)
Virtual Visit via Video Note   I connected with@on  11/26/18 at  8:30 AM EDT by a video enabled telemedicine application and verified that I am speaking with the correct person using two identifiers.  Location patient: home Location provider:work or home office Persons participating in the virtual visit: patient, provider  I discussed the limitations of evaluation and management by telemedicine and the availability of in person appointments. The patient expressed understanding and agreed to proceed.   HPI: Kent Osborne is being seen today for chronic disease management.  Hyperlipidemia, currently he is on simvastatin 40 mg daily. He is following low-fat diet as well. + CAD. He is tolerating medication well, no side effects reported.  Lab Results  Component Value Date   CHOL 98 08/10/2017   HDL 25.50 (L) 08/10/2017   LDLCALC 33 08/10/2017   LDLDIRECT 80.7 05/05/2010   TRIG 196.0 (H) 08/10/2017   CHOLHDL 4 08/10/2017   Hypertension: Currently he is on Coreg 6.25 mg twice daily, hydralazine 50 mg 3 times daily, Imdur 60 mg daily. He denies headache, visual changes, chest pain, worsening dyspnea, diaphoresis, dizziness, or worsening edema.  End-stage renal disease, he has dialysis 3 times per week on blood work every Wednesday. He has not noted gross hematuria, which he had it a few months ago and followed with urologist.  Lab Results  Component Value Date   CREATININE 3.89 (H) 04/23/2018   BUN 34 (H) 04/23/2018   NA 143 04/23/2018   K 3.5 04/23/2018   CL 96 (L) 04/23/2018   CO2 33 (H) 04/23/2018    DM II: Currently he is on Tresiba 15 units and NovoLog sliding scale. BS between 100-130, he denies hypoglycemias. Denies abdominal pain, nausea,vomiting, polydipsia,polyuria, or polyphagia.   Lab Results  Component Value Date   HGBA1C 4.7 (L) 04/18/2018   He is currently on amitriptyline 25 mg to help him sleep and to treat peripheral neuropathy. + Feet numbness and  burning, stable. He is tolerating medication well.  He is staying at home, his son gets groceries and everything he needs, so he does not have to leave the house.  COVID-19 screening questions: Denies new fever,cough,sore throat,or possible exposure to COVID-19. Denies changes in small or taste.   ROS: See pertinent positives and negatives per HPI.  Past Medical History:  Diagnosis Date  . Acute renal failure superimposed on chronic kidney disease ([place])    [name] 11/01/2016  . Anemia   . Arthritis    "back, knees" (11/01/2016)  . Asthma   . CAD (coronary artery disease) 12/31/07   [facility] stents- placed in the circumflex and LAD  . Chronic bronchitis ([place])   . Hemodialysis status ([place])   . History of blood transfusion 10/30/2016   "related to anemia"  . History of gout   . History of kidney stones   . Hyperlipidemia   . Hypertension   . NSTEMI (non-ST elevated myocardial infarction) ([place]) 10/30/2016  . OA (osteoarthritis)   . Obese   . On home oxygen therapy    "2L w/CPAP" (11/01/2016)  . OSA on CPAP   . Psoriatic arthritis ([place])   . Type II diabetes mellitus ([place])     Past Surgical History:  Procedure Laterality Date  . AV FISTULA PLACEMENT    . CORONARY ANGIOPLASTY WITH STENT PLACEMENT  2009   "3 stents"  . CYSTOSCOPY WITH URETEROSCOPY AND STENT PLACEMENT Bilateral 04/23/2018   Procedure: CYSTOSCOPY WITH BLADDER  BIOPSY BILATERAL RETROGRADE PYELOGRAM LEFT URETEROSCOPY WITH  BIOPSY AND LEFT URETERAL  STENT PLACEMENT;  Surgeon: Festus Aloe, MD;  Location: [facility];  Service: Urology;  Laterality: Bilateral;    Family History  Problem Relation Age of Onset  . Diabetes Other   . Hyperlipidemia Other   . Hypertension Other     Social History   Socioeconomic History  . Marital status: Divorced    Spouse name: Not on file  . Number of children: Not on file  . Years of education: Not on file  . Highest education level: Not on file  Occupational History   . Not on file  Social Needs  . Financial resource strain: Not on file  . Food insecurity:    Worry: Not on file    Inability: Not on file  . Transportation needs:    Medical: Not on file    Non-medical: Not on file  Tobacco Use  . Smoking status: Never Smoker  . Smokeless tobacco: Never Used  Substance and Sexual Activity  . Alcohol use: Not Currently    Comment: 11/01/2016 "nothing in years"  . Drug use: No  . Sexual activity: Yes  Lifestyle  . Physical activity:    Days per week: Not on file    Minutes per session: Not on file  . Stress: Not on file  Relationships  . Social connections:    Talks on phone: Not on file    Gets together: Not on file    Attends religious service: Not on file    Active member of club or organization: Not on file    Attends meetings of clubs or organizations: Not on file    Relationship status: Not on file  . Intimate partner violence:    Fear of current or ex partner: Not on file    Emotionally abused: Not on file    Physically abused: Not on file    Forced sexual activity: Not on file  Other Topics Concern  . Not on file  Social History Narrative  . Not on file      Current Outpatient Medications:  .  amitriptyline (ELAVIL) 25 MG tablet, Take 25 mg by mouth at bedtime. , Disp: , Rfl:  .  aspirin EC 81 MG tablet, Take 81 mg by mouth daily., Disp: , Rfl:  .  AURYXIA 1 GM 210 MG(Fe) tablet, , Disp: , Rfl:  .  calcium acetate (PHOSLO) 667 MG capsule, Take 1 capsule (667 mg total) by mouth 3 (three) times daily with meals., Disp: 90 capsule, Rfl: 0 .  carvedilol (COREG) 6.25 MG tablet, TAKE 1 TABLET BY MOUTH TWICE DAILY WITH  MEALS (Patient taking differently: Take 6.25 mg by mouth 2 (two) times daily with a meal. ), Disp: 180 tablet, Rfl: 3 .  Cholecalciferol (VITAMIN D) 2000 units tablet, Take 2,000 Units by mouth daily., Disp: , Rfl:  .  colchicine 0.6 MG tablet, 2 tabs as soon as gout symptoms start., Disp: 30 tablet, Rfl: 2 .   doxycycline (VIBRAMYCIN) 100 MG capsule, Take 100 mg by mouth 2 (two) times daily., Disp: , Rfl: 0 .  glucose blood test strip, Use to test glucose 3 times daily. Dx: E 11.9, Disp: 300 each, Rfl: 3 .  hydrALAZINE (APRESOLINE) 50 MG tablet, Take 1 tablet (50 mg total) by mouth 3 (three) times daily., Disp: 90 tablet, Rfl: 2 .  insulin aspart (NOVOLOG FLEXPEN) 100 UNIT/ML FlexPen, Inject 2-25 Units into the skin 3 (three) times daily with meals. Per sliding scale, Disp: 15  mL, Rfl: 3 .  insulin degludec (TRESIBA FLEXTOUCH) 100 UNIT/ML SOPN FlexTouch Pen, Inject 0.5 mLs (50 Units total) into the skin daily., Disp: 45 pen, Rfl: 3 .  isosorbide mononitrate (IMDUR) 60 MG 24 hr tablet, TAKE 1 TABLET BY MOUTH ONCE DAILY, Disp: 90 tablet, Rfl: 0 .  lidocaine-prilocaine (EMLA) cream, APPLY SMALL AMOUNT TO ACCESS SITE (AVF) 1 TO 2 HOURS BEFORE DIALYSIS. COVER WITH OCCLUSIVE DRESSING (SARAN WRAP), Disp: , Rfl: 12 .  montelukast (SINGULAIR) 10 MG tablet, Take 10 mg by mouth at bedtime. , Disp: , Rfl:  .  nitroGLYCERIN (NITROSTAT) 0.4 MG SL tablet, Place 1 tablet (0.4 mg total) under the tongue every 5 (five) minutes as needed for chest pain., Disp: 30 tablet, Rfl: 12 .  Oxycodone HCl 10 MG TABS, Take 10 mg by mouth 4 (four) times daily as needed (for pain). , Disp: , Rfl:  .  simvastatin (ZOCOR) 40 MG tablet, TAKE 1 TABLET BY MOUTH EVERY DAY AT BEDTIME (Patient taking differently: Take 40 mg by mouth at bedtime. ), Disp: 90 tablet, Rfl: 3 .  tamsulosin (FLOMAX) 0.4 MG CAPS capsule, Take 1 capsule (0.4 mg total) by mouth daily., Disp: 30 capsule, Rfl: 0 .  torsemide (DEMADEX) 20 MG tablet, Take 1 tablet (20 mg total) by mouth daily. Please discuss with your nephrologist Dr Marlowe Sax regarding further dosing adjustment of this meds. Please discuss with Dr Marlowe Sax regarding timing of starting on dialysis., Disp: 10 tablet, Rfl: 0  EXAM:  VITALS per patient if applicable:BP (!) 545/62   Pulse 65   Resp 16   GENERAL:  alert, oriented, appears well and in no acute distress  HEENT: atraumatic, conjunttiva clear, no obvious abnormalities on inspection of face.  NECK: normal movements of the head and neck  LUNGS: on inspection no signs of respiratory distress, breathing rate appears normal, no obvious gross SOB, gasping or wheezing  CV: no obvious cyanosis  MS: moves all visible extremities without noticeable abnormality  PSYCH/NEURO: pleasant and cooperative, no obvious depression or anxiety, speech and thought processing grossly intact  ASSESSMENT AND PLAN:  Discussed the following assessment and plan:  Type 2 diabetes mellitus with chronic kidney disease on chronic dialysis, without long-term current use of insulin (HCC)  Essential hypertension  Diabetic peripheral neuropathy associated with type 2 diabetes mellitus ([place])  Chronic kidney disease (CKD), stage IV (severe) (HCC)  Hyperlipidemia, unspecified hyperlipidemia type  Hyperlipemia LDL goal < 70. No changes in simvastatin 40 mg daily. Continue low-fat diet. We will plan on checking lipid panel next visit.  Diabetic peripheral neuropathy associated with type 2 diabetes mellitus (HCC) Problem is stable. Adequate foot care. No changes in amitriptyline 25 mg.  Essential hypertension BP today mildly low. For now no changes in current management given the fact he is not having symptoms that indicate hypotension. Continue low-salt diet. He has BP check every 3 times per week before and after dialysis.  Type II diabetes mellitus with renal manifestations ([place]) Based on reported BS's, problem seems to be adequately controlled. Caution with hypoglycemia. No changes in current management. Continue following dietary recommendations. We will plan on checking A1c and fructosamine next visit.     I discussed the assessment and treatment plan with the patient. The patient was provided an opportunity to ask questions and all were  answered. The patient agreed with the plan and demonstrated an understanding of the instructions.     Return in about 6 months (around 05/28/2019) for DM II and  HTN.    Betty Martinique, MD

## 2018-11-26 NOTE — Assessment & Plan Note (Signed)
Problem is stable. Adequate foot care. No changes in amitriptyline 25 mg.

## 2018-11-26 NOTE — Assessment & Plan Note (Signed)
LDL goal < 70. No changes in simvastatin 40 mg daily. Continue low-fat diet. We will plan on checking lipid panel next visit.

## 2018-11-27 DIAGNOSIS — D500 Iron deficiency anemia secondary to blood loss (chronic): Secondary | ICD-10-CM | POA: Diagnosis not present

## 2018-11-27 DIAGNOSIS — N186 End stage renal disease: Secondary | ICD-10-CM | POA: Diagnosis not present

## 2018-11-27 DIAGNOSIS — D631 Anemia in chronic kidney disease: Secondary | ICD-10-CM | POA: Diagnosis not present

## 2018-11-27 DIAGNOSIS — E8809 Other disorders of plasma-protein metabolism, not elsewhere classified: Secondary | ICD-10-CM | POA: Diagnosis not present

## 2018-11-27 DIAGNOSIS — N2581 Secondary hyperparathyroidism of renal origin: Secondary | ICD-10-CM | POA: Diagnosis not present

## 2018-11-29 DIAGNOSIS — N2581 Secondary hyperparathyroidism of renal origin: Secondary | ICD-10-CM | POA: Diagnosis not present

## 2018-11-29 DIAGNOSIS — D500 Iron deficiency anemia secondary to blood loss (chronic): Secondary | ICD-10-CM | POA: Diagnosis not present

## 2018-11-29 DIAGNOSIS — N186 End stage renal disease: Secondary | ICD-10-CM | POA: Diagnosis not present

## 2018-11-29 DIAGNOSIS — D631 Anemia in chronic kidney disease: Secondary | ICD-10-CM | POA: Diagnosis not present

## 2018-11-29 DIAGNOSIS — E8809 Other disorders of plasma-protein metabolism, not elsewhere classified: Secondary | ICD-10-CM | POA: Diagnosis not present

## 2018-12-02 DIAGNOSIS — D631 Anemia in chronic kidney disease: Secondary | ICD-10-CM | POA: Diagnosis not present

## 2018-12-02 DIAGNOSIS — E8809 Other disorders of plasma-protein metabolism, not elsewhere classified: Secondary | ICD-10-CM | POA: Diagnosis not present

## 2018-12-02 DIAGNOSIS — N2581 Secondary hyperparathyroidism of renal origin: Secondary | ICD-10-CM | POA: Diagnosis not present

## 2018-12-02 DIAGNOSIS — N186 End stage renal disease: Secondary | ICD-10-CM | POA: Diagnosis not present

## 2018-12-02 DIAGNOSIS — D500 Iron deficiency anemia secondary to blood loss (chronic): Secondary | ICD-10-CM | POA: Diagnosis not present

## 2018-12-04 DIAGNOSIS — N186 End stage renal disease: Secondary | ICD-10-CM | POA: Diagnosis not present

## 2018-12-04 DIAGNOSIS — N2581 Secondary hyperparathyroidism of renal origin: Secondary | ICD-10-CM | POA: Diagnosis not present

## 2018-12-04 DIAGNOSIS — D500 Iron deficiency anemia secondary to blood loss (chronic): Secondary | ICD-10-CM | POA: Diagnosis not present

## 2018-12-04 DIAGNOSIS — D631 Anemia in chronic kidney disease: Secondary | ICD-10-CM | POA: Diagnosis not present

## 2018-12-04 DIAGNOSIS — E8809 Other disorders of plasma-protein metabolism, not elsewhere classified: Secondary | ICD-10-CM | POA: Diagnosis not present

## 2018-12-06 ENCOUNTER — Other Ambulatory Visit: Payer: Self-pay | Admitting: Family Medicine

## 2018-12-06 DIAGNOSIS — D500 Iron deficiency anemia secondary to blood loss (chronic): Secondary | ICD-10-CM | POA: Diagnosis not present

## 2018-12-06 DIAGNOSIS — E8809 Other disorders of plasma-protein metabolism, not elsewhere classified: Secondary | ICD-10-CM | POA: Diagnosis not present

## 2018-12-06 DIAGNOSIS — N2581 Secondary hyperparathyroidism of renal origin: Secondary | ICD-10-CM | POA: Diagnosis not present

## 2018-12-06 DIAGNOSIS — D631 Anemia in chronic kidney disease: Secondary | ICD-10-CM | POA: Diagnosis not present

## 2018-12-06 DIAGNOSIS — N186 End stage renal disease: Secondary | ICD-10-CM | POA: Diagnosis not present

## 2018-12-09 DIAGNOSIS — D631 Anemia in chronic kidney disease: Secondary | ICD-10-CM | POA: Diagnosis not present

## 2018-12-09 DIAGNOSIS — N186 End stage renal disease: Secondary | ICD-10-CM | POA: Diagnosis not present

## 2018-12-09 DIAGNOSIS — E8809 Other disorders of plasma-protein metabolism, not elsewhere classified: Secondary | ICD-10-CM | POA: Diagnosis not present

## 2018-12-09 DIAGNOSIS — D500 Iron deficiency anemia secondary to blood loss (chronic): Secondary | ICD-10-CM | POA: Diagnosis not present

## 2018-12-09 DIAGNOSIS — N2581 Secondary hyperparathyroidism of renal origin: Secondary | ICD-10-CM | POA: Diagnosis not present

## 2018-12-11 DIAGNOSIS — N186 End stage renal disease: Secondary | ICD-10-CM | POA: Diagnosis not present

## 2018-12-11 DIAGNOSIS — E8809 Other disorders of plasma-protein metabolism, not elsewhere classified: Secondary | ICD-10-CM | POA: Diagnosis not present

## 2018-12-11 DIAGNOSIS — D631 Anemia in chronic kidney disease: Secondary | ICD-10-CM | POA: Diagnosis not present

## 2018-12-11 DIAGNOSIS — N2581 Secondary hyperparathyroidism of renal origin: Secondary | ICD-10-CM | POA: Diagnosis not present

## 2018-12-11 DIAGNOSIS — D500 Iron deficiency anemia secondary to blood loss (chronic): Secondary | ICD-10-CM | POA: Diagnosis not present

## 2018-12-13 DIAGNOSIS — N2581 Secondary hyperparathyroidism of renal origin: Secondary | ICD-10-CM | POA: Diagnosis not present

## 2018-12-13 DIAGNOSIS — E8809 Other disorders of plasma-protein metabolism, not elsewhere classified: Secondary | ICD-10-CM | POA: Diagnosis not present

## 2018-12-13 DIAGNOSIS — D500 Iron deficiency anemia secondary to blood loss (chronic): Secondary | ICD-10-CM | POA: Diagnosis not present

## 2018-12-13 DIAGNOSIS — D631 Anemia in chronic kidney disease: Secondary | ICD-10-CM | POA: Diagnosis not present

## 2018-12-13 DIAGNOSIS — N186 End stage renal disease: Secondary | ICD-10-CM | POA: Diagnosis not present

## 2018-12-16 DIAGNOSIS — N186 End stage renal disease: Secondary | ICD-10-CM | POA: Diagnosis not present

## 2018-12-16 DIAGNOSIS — E8809 Other disorders of plasma-protein metabolism, not elsewhere classified: Secondary | ICD-10-CM | POA: Diagnosis not present

## 2018-12-16 DIAGNOSIS — D500 Iron deficiency anemia secondary to blood loss (chronic): Secondary | ICD-10-CM | POA: Diagnosis not present

## 2018-12-16 DIAGNOSIS — D631 Anemia in chronic kidney disease: Secondary | ICD-10-CM | POA: Diagnosis not present

## 2018-12-16 DIAGNOSIS — N2581 Secondary hyperparathyroidism of renal origin: Secondary | ICD-10-CM | POA: Diagnosis not present

## 2018-12-17 ENCOUNTER — Other Ambulatory Visit: Payer: Self-pay | Admitting: Family Medicine

## 2018-12-17 DIAGNOSIS — I10 Essential (primary) hypertension: Secondary | ICD-10-CM

## 2018-12-18 DIAGNOSIS — D500 Iron deficiency anemia secondary to blood loss (chronic): Secondary | ICD-10-CM | POA: Diagnosis not present

## 2018-12-18 DIAGNOSIS — N186 End stage renal disease: Secondary | ICD-10-CM | POA: Diagnosis not present

## 2018-12-18 DIAGNOSIS — N2581 Secondary hyperparathyroidism of renal origin: Secondary | ICD-10-CM | POA: Diagnosis not present

## 2018-12-18 DIAGNOSIS — E8809 Other disorders of plasma-protein metabolism, not elsewhere classified: Secondary | ICD-10-CM | POA: Diagnosis not present

## 2018-12-18 DIAGNOSIS — D631 Anemia in chronic kidney disease: Secondary | ICD-10-CM | POA: Diagnosis not present

## 2018-12-19 DIAGNOSIS — Z992 Dependence on renal dialysis: Secondary | ICD-10-CM | POA: Diagnosis not present

## 2018-12-19 DIAGNOSIS — N186 End stage renal disease: Secondary | ICD-10-CM | POA: Diagnosis not present

## 2018-12-20 DIAGNOSIS — E8809 Other disorders of plasma-protein metabolism, not elsewhere classified: Secondary | ICD-10-CM | POA: Diagnosis not present

## 2018-12-20 DIAGNOSIS — N186 End stage renal disease: Secondary | ICD-10-CM | POA: Diagnosis not present

## 2018-12-20 DIAGNOSIS — D500 Iron deficiency anemia secondary to blood loss (chronic): Secondary | ICD-10-CM | POA: Diagnosis not present

## 2018-12-20 DIAGNOSIS — N2581 Secondary hyperparathyroidism of renal origin: Secondary | ICD-10-CM | POA: Diagnosis not present

## 2018-12-20 DIAGNOSIS — D631 Anemia in chronic kidney disease: Secondary | ICD-10-CM | POA: Diagnosis not present

## 2018-12-23 DIAGNOSIS — N2581 Secondary hyperparathyroidism of renal origin: Secondary | ICD-10-CM | POA: Diagnosis not present

## 2018-12-23 DIAGNOSIS — D631 Anemia in chronic kidney disease: Secondary | ICD-10-CM | POA: Diagnosis not present

## 2018-12-23 DIAGNOSIS — N186 End stage renal disease: Secondary | ICD-10-CM | POA: Diagnosis not present

## 2018-12-23 DIAGNOSIS — E8809 Other disorders of plasma-protein metabolism, not elsewhere classified: Secondary | ICD-10-CM | POA: Diagnosis not present

## 2018-12-23 DIAGNOSIS — D500 Iron deficiency anemia secondary to blood loss (chronic): Secondary | ICD-10-CM | POA: Diagnosis not present

## 2018-12-25 DIAGNOSIS — E8809 Other disorders of plasma-protein metabolism, not elsewhere classified: Secondary | ICD-10-CM | POA: Diagnosis not present

## 2018-12-25 DIAGNOSIS — N186 End stage renal disease: Secondary | ICD-10-CM | POA: Diagnosis not present

## 2018-12-25 DIAGNOSIS — D500 Iron deficiency anemia secondary to blood loss (chronic): Secondary | ICD-10-CM | POA: Diagnosis not present

## 2018-12-25 DIAGNOSIS — D631 Anemia in chronic kidney disease: Secondary | ICD-10-CM | POA: Diagnosis not present

## 2018-12-25 DIAGNOSIS — N2581 Secondary hyperparathyroidism of renal origin: Secondary | ICD-10-CM | POA: Diagnosis not present

## 2018-12-27 DIAGNOSIS — N2581 Secondary hyperparathyroidism of renal origin: Secondary | ICD-10-CM | POA: Diagnosis not present

## 2018-12-27 DIAGNOSIS — D500 Iron deficiency anemia secondary to blood loss (chronic): Secondary | ICD-10-CM | POA: Diagnosis not present

## 2018-12-27 DIAGNOSIS — N186 End stage renal disease: Secondary | ICD-10-CM | POA: Diagnosis not present

## 2018-12-27 DIAGNOSIS — D631 Anemia in chronic kidney disease: Secondary | ICD-10-CM | POA: Diagnosis not present

## 2018-12-27 DIAGNOSIS — E8809 Other disorders of plasma-protein metabolism, not elsewhere classified: Secondary | ICD-10-CM | POA: Diagnosis not present

## 2018-12-30 DIAGNOSIS — E8809 Other disorders of plasma-protein metabolism, not elsewhere classified: Secondary | ICD-10-CM | POA: Diagnosis not present

## 2018-12-30 DIAGNOSIS — N2581 Secondary hyperparathyroidism of renal origin: Secondary | ICD-10-CM | POA: Diagnosis not present

## 2018-12-30 DIAGNOSIS — N186 End stage renal disease: Secondary | ICD-10-CM | POA: Diagnosis not present

## 2018-12-30 DIAGNOSIS — D631 Anemia in chronic kidney disease: Secondary | ICD-10-CM | POA: Diagnosis not present

## 2018-12-30 DIAGNOSIS — D500 Iron deficiency anemia secondary to blood loss (chronic): Secondary | ICD-10-CM | POA: Diagnosis not present

## 2019-01-01 DIAGNOSIS — E8809 Other disorders of plasma-protein metabolism, not elsewhere classified: Secondary | ICD-10-CM | POA: Diagnosis not present

## 2019-01-01 DIAGNOSIS — D631 Anemia in chronic kidney disease: Secondary | ICD-10-CM | POA: Diagnosis not present

## 2019-01-01 DIAGNOSIS — N186 End stage renal disease: Secondary | ICD-10-CM | POA: Diagnosis not present

## 2019-01-01 DIAGNOSIS — N2581 Secondary hyperparathyroidism of renal origin: Secondary | ICD-10-CM | POA: Diagnosis not present

## 2019-01-01 DIAGNOSIS — D500 Iron deficiency anemia secondary to blood loss (chronic): Secondary | ICD-10-CM | POA: Diagnosis not present

## 2019-01-03 DIAGNOSIS — D500 Iron deficiency anemia secondary to blood loss (chronic): Secondary | ICD-10-CM | POA: Diagnosis not present

## 2019-01-03 DIAGNOSIS — E8809 Other disorders of plasma-protein metabolism, not elsewhere classified: Secondary | ICD-10-CM | POA: Diagnosis not present

## 2019-01-03 DIAGNOSIS — N186 End stage renal disease: Secondary | ICD-10-CM | POA: Diagnosis not present

## 2019-01-03 DIAGNOSIS — N2581 Secondary hyperparathyroidism of renal origin: Secondary | ICD-10-CM | POA: Diagnosis not present

## 2019-01-03 DIAGNOSIS — D631 Anemia in chronic kidney disease: Secondary | ICD-10-CM | POA: Diagnosis not present

## 2019-01-06 DIAGNOSIS — N186 End stage renal disease: Secondary | ICD-10-CM | POA: Diagnosis not present

## 2019-01-06 DIAGNOSIS — D631 Anemia in chronic kidney disease: Secondary | ICD-10-CM | POA: Diagnosis not present

## 2019-01-06 DIAGNOSIS — N2581 Secondary hyperparathyroidism of renal origin: Secondary | ICD-10-CM | POA: Diagnosis not present

## 2019-01-06 DIAGNOSIS — D500 Iron deficiency anemia secondary to blood loss (chronic): Secondary | ICD-10-CM | POA: Diagnosis not present

## 2019-01-06 DIAGNOSIS — E8809 Other disorders of plasma-protein metabolism, not elsewhere classified: Secondary | ICD-10-CM | POA: Diagnosis not present

## 2019-01-08 DIAGNOSIS — D631 Anemia in chronic kidney disease: Secondary | ICD-10-CM | POA: Diagnosis not present

## 2019-01-08 DIAGNOSIS — E8809 Other disorders of plasma-protein metabolism, not elsewhere classified: Secondary | ICD-10-CM | POA: Diagnosis not present

## 2019-01-08 DIAGNOSIS — D500 Iron deficiency anemia secondary to blood loss (chronic): Secondary | ICD-10-CM | POA: Diagnosis not present

## 2019-01-08 DIAGNOSIS — N186 End stage renal disease: Secondary | ICD-10-CM | POA: Diagnosis not present

## 2019-01-08 DIAGNOSIS — N2581 Secondary hyperparathyroidism of renal origin: Secondary | ICD-10-CM | POA: Diagnosis not present

## 2019-01-10 DIAGNOSIS — N186 End stage renal disease: Secondary | ICD-10-CM | POA: Diagnosis not present

## 2019-01-10 DIAGNOSIS — D631 Anemia in chronic kidney disease: Secondary | ICD-10-CM | POA: Diagnosis not present

## 2019-01-10 DIAGNOSIS — D500 Iron deficiency anemia secondary to blood loss (chronic): Secondary | ICD-10-CM | POA: Diagnosis not present

## 2019-01-10 DIAGNOSIS — N2581 Secondary hyperparathyroidism of renal origin: Secondary | ICD-10-CM | POA: Diagnosis not present

## 2019-01-10 DIAGNOSIS — E8809 Other disorders of plasma-protein metabolism, not elsewhere classified: Secondary | ICD-10-CM | POA: Diagnosis not present

## 2019-01-13 DIAGNOSIS — E8809 Other disorders of plasma-protein metabolism, not elsewhere classified: Secondary | ICD-10-CM | POA: Diagnosis not present

## 2019-01-13 DIAGNOSIS — N2581 Secondary hyperparathyroidism of renal origin: Secondary | ICD-10-CM | POA: Diagnosis not present

## 2019-01-13 DIAGNOSIS — D631 Anemia in chronic kidney disease: Secondary | ICD-10-CM | POA: Diagnosis not present

## 2019-01-13 DIAGNOSIS — N186 End stage renal disease: Secondary | ICD-10-CM | POA: Diagnosis not present

## 2019-01-13 DIAGNOSIS — D500 Iron deficiency anemia secondary to blood loss (chronic): Secondary | ICD-10-CM | POA: Diagnosis not present

## 2019-01-14 DIAGNOSIS — R310 Gross hematuria: Secondary | ICD-10-CM | POA: Diagnosis not present

## 2019-01-14 DIAGNOSIS — D3002 Benign neoplasm of left kidney: Secondary | ICD-10-CM | POA: Diagnosis not present

## 2019-01-15 DIAGNOSIS — D500 Iron deficiency anemia secondary to blood loss (chronic): Secondary | ICD-10-CM | POA: Diagnosis not present

## 2019-01-15 DIAGNOSIS — N186 End stage renal disease: Secondary | ICD-10-CM | POA: Diagnosis not present

## 2019-01-15 DIAGNOSIS — D631 Anemia in chronic kidney disease: Secondary | ICD-10-CM | POA: Diagnosis not present

## 2019-01-15 DIAGNOSIS — E8809 Other disorders of plasma-protein metabolism, not elsewhere classified: Secondary | ICD-10-CM | POA: Diagnosis not present

## 2019-01-15 DIAGNOSIS — N2581 Secondary hyperparathyroidism of renal origin: Secondary | ICD-10-CM | POA: Diagnosis not present

## 2019-01-17 DIAGNOSIS — D631 Anemia in chronic kidney disease: Secondary | ICD-10-CM | POA: Diagnosis not present

## 2019-01-17 DIAGNOSIS — E8809 Other disorders of plasma-protein metabolism, not elsewhere classified: Secondary | ICD-10-CM | POA: Diagnosis not present

## 2019-01-17 DIAGNOSIS — D500 Iron deficiency anemia secondary to blood loss (chronic): Secondary | ICD-10-CM | POA: Diagnosis not present

## 2019-01-17 DIAGNOSIS — N2581 Secondary hyperparathyroidism of renal origin: Secondary | ICD-10-CM | POA: Diagnosis not present

## 2019-01-17 DIAGNOSIS — N186 End stage renal disease: Secondary | ICD-10-CM | POA: Diagnosis not present

## 2019-01-19 DIAGNOSIS — N186 End stage renal disease: Secondary | ICD-10-CM | POA: Diagnosis not present

## 2019-01-19 DIAGNOSIS — Z992 Dependence on renal dialysis: Secondary | ICD-10-CM | POA: Diagnosis not present

## 2019-01-20 ENCOUNTER — Other Ambulatory Visit: Payer: Self-pay | Admitting: Urology

## 2019-01-20 DIAGNOSIS — D500 Iron deficiency anemia secondary to blood loss (chronic): Secondary | ICD-10-CM | POA: Diagnosis not present

## 2019-01-20 DIAGNOSIS — N186 End stage renal disease: Secondary | ICD-10-CM | POA: Diagnosis not present

## 2019-01-20 DIAGNOSIS — N2581 Secondary hyperparathyroidism of renal origin: Secondary | ICD-10-CM | POA: Diagnosis not present

## 2019-01-20 DIAGNOSIS — D6959 Other secondary thrombocytopenia: Secondary | ICD-10-CM | POA: Diagnosis not present

## 2019-01-20 DIAGNOSIS — D3002 Benign neoplasm of left kidney: Secondary | ICD-10-CM

## 2019-01-20 DIAGNOSIS — E8809 Other disorders of plasma-protein metabolism, not elsewhere classified: Secondary | ICD-10-CM | POA: Diagnosis not present

## 2019-01-20 DIAGNOSIS — D631 Anemia in chronic kidney disease: Secondary | ICD-10-CM | POA: Diagnosis not present

## 2019-01-22 DIAGNOSIS — D631 Anemia in chronic kidney disease: Secondary | ICD-10-CM | POA: Diagnosis not present

## 2019-01-22 DIAGNOSIS — N186 End stage renal disease: Secondary | ICD-10-CM | POA: Diagnosis not present

## 2019-01-22 DIAGNOSIS — E8809 Other disorders of plasma-protein metabolism, not elsewhere classified: Secondary | ICD-10-CM | POA: Diagnosis not present

## 2019-01-22 DIAGNOSIS — N2581 Secondary hyperparathyroidism of renal origin: Secondary | ICD-10-CM | POA: Diagnosis not present

## 2019-01-22 DIAGNOSIS — D500 Iron deficiency anemia secondary to blood loss (chronic): Secondary | ICD-10-CM | POA: Diagnosis not present

## 2019-01-24 DIAGNOSIS — D500 Iron deficiency anemia secondary to blood loss (chronic): Secondary | ICD-10-CM | POA: Diagnosis not present

## 2019-01-24 DIAGNOSIS — N2581 Secondary hyperparathyroidism of renal origin: Secondary | ICD-10-CM | POA: Diagnosis not present

## 2019-01-24 DIAGNOSIS — N186 End stage renal disease: Secondary | ICD-10-CM | POA: Diagnosis not present

## 2019-01-24 DIAGNOSIS — D631 Anemia in chronic kidney disease: Secondary | ICD-10-CM | POA: Diagnosis not present

## 2019-01-24 DIAGNOSIS — E8809 Other disorders of plasma-protein metabolism, not elsewhere classified: Secondary | ICD-10-CM | POA: Diagnosis not present

## 2019-01-27 DIAGNOSIS — E8809 Other disorders of plasma-protein metabolism, not elsewhere classified: Secondary | ICD-10-CM | POA: Diagnosis not present

## 2019-01-27 DIAGNOSIS — N2581 Secondary hyperparathyroidism of renal origin: Secondary | ICD-10-CM | POA: Diagnosis not present

## 2019-01-27 DIAGNOSIS — N186 End stage renal disease: Secondary | ICD-10-CM | POA: Diagnosis not present

## 2019-01-27 DIAGNOSIS — D500 Iron deficiency anemia secondary to blood loss (chronic): Secondary | ICD-10-CM | POA: Diagnosis not present

## 2019-01-27 DIAGNOSIS — D631 Anemia in chronic kidney disease: Secondary | ICD-10-CM | POA: Diagnosis not present

## 2019-01-29 DIAGNOSIS — N2581 Secondary hyperparathyroidism of renal origin: Secondary | ICD-10-CM | POA: Diagnosis not present

## 2019-01-29 DIAGNOSIS — E1129 Type 2 diabetes mellitus with other diabetic kidney complication: Secondary | ICD-10-CM | POA: Diagnosis not present

## 2019-01-29 DIAGNOSIS — E8809 Other disorders of plasma-protein metabolism, not elsewhere classified: Secondary | ICD-10-CM | POA: Diagnosis not present

## 2019-01-29 DIAGNOSIS — D500 Iron deficiency anemia secondary to blood loss (chronic): Secondary | ICD-10-CM | POA: Diagnosis not present

## 2019-01-29 DIAGNOSIS — D631 Anemia in chronic kidney disease: Secondary | ICD-10-CM | POA: Diagnosis not present

## 2019-01-29 DIAGNOSIS — N186 End stage renal disease: Secondary | ICD-10-CM | POA: Diagnosis not present

## 2019-01-30 DIAGNOSIS — E113293 Type 2 diabetes mellitus with mild nonproliferative diabetic retinopathy without macular edema, bilateral: Secondary | ICD-10-CM | POA: Diagnosis not present

## 2019-01-31 DIAGNOSIS — N186 End stage renal disease: Secondary | ICD-10-CM | POA: Diagnosis not present

## 2019-01-31 DIAGNOSIS — N2581 Secondary hyperparathyroidism of renal origin: Secondary | ICD-10-CM | POA: Diagnosis not present

## 2019-01-31 DIAGNOSIS — D500 Iron deficiency anemia secondary to blood loss (chronic): Secondary | ICD-10-CM | POA: Diagnosis not present

## 2019-01-31 DIAGNOSIS — E8809 Other disorders of plasma-protein metabolism, not elsewhere classified: Secondary | ICD-10-CM | POA: Diagnosis not present

## 2019-01-31 DIAGNOSIS — D631 Anemia in chronic kidney disease: Secondary | ICD-10-CM | POA: Diagnosis not present

## 2019-02-03 DIAGNOSIS — N2581 Secondary hyperparathyroidism of renal origin: Secondary | ICD-10-CM | POA: Diagnosis not present

## 2019-02-03 DIAGNOSIS — E8809 Other disorders of plasma-protein metabolism, not elsewhere classified: Secondary | ICD-10-CM | POA: Diagnosis not present

## 2019-02-03 DIAGNOSIS — D631 Anemia in chronic kidney disease: Secondary | ICD-10-CM | POA: Diagnosis not present

## 2019-02-03 DIAGNOSIS — N186 End stage renal disease: Secondary | ICD-10-CM | POA: Diagnosis not present

## 2019-02-03 DIAGNOSIS — D500 Iron deficiency anemia secondary to blood loss (chronic): Secondary | ICD-10-CM | POA: Diagnosis not present

## 2019-02-05 DIAGNOSIS — D500 Iron deficiency anemia secondary to blood loss (chronic): Secondary | ICD-10-CM | POA: Diagnosis not present

## 2019-02-05 DIAGNOSIS — N186 End stage renal disease: Secondary | ICD-10-CM | POA: Diagnosis not present

## 2019-02-05 DIAGNOSIS — N2581 Secondary hyperparathyroidism of renal origin: Secondary | ICD-10-CM | POA: Diagnosis not present

## 2019-02-05 DIAGNOSIS — D631 Anemia in chronic kidney disease: Secondary | ICD-10-CM | POA: Diagnosis not present

## 2019-02-05 DIAGNOSIS — E8809 Other disorders of plasma-protein metabolism, not elsewhere classified: Secondary | ICD-10-CM | POA: Diagnosis not present

## 2019-02-06 DIAGNOSIS — M79673 Pain in unspecified foot: Secondary | ICD-10-CM | POA: Diagnosis not present

## 2019-02-06 DIAGNOSIS — M2010 Hallux valgus (acquired), unspecified foot: Secondary | ICD-10-CM | POA: Diagnosis not present

## 2019-02-06 DIAGNOSIS — E1140 Type 2 diabetes mellitus with diabetic neuropathy, unspecified: Secondary | ICD-10-CM | POA: Diagnosis not present

## 2019-02-06 DIAGNOSIS — Z992 Dependence on renal dialysis: Secondary | ICD-10-CM | POA: Diagnosis not present

## 2019-02-07 DIAGNOSIS — E8809 Other disorders of plasma-protein metabolism, not elsewhere classified: Secondary | ICD-10-CM | POA: Diagnosis not present

## 2019-02-07 DIAGNOSIS — N186 End stage renal disease: Secondary | ICD-10-CM | POA: Diagnosis not present

## 2019-02-07 DIAGNOSIS — D500 Iron deficiency anemia secondary to blood loss (chronic): Secondary | ICD-10-CM | POA: Diagnosis not present

## 2019-02-07 DIAGNOSIS — N2581 Secondary hyperparathyroidism of renal origin: Secondary | ICD-10-CM | POA: Diagnosis not present

## 2019-02-07 DIAGNOSIS — D631 Anemia in chronic kidney disease: Secondary | ICD-10-CM | POA: Diagnosis not present

## 2019-02-10 DIAGNOSIS — G8929 Other chronic pain: Secondary | ICD-10-CM | POA: Diagnosis not present

## 2019-02-10 DIAGNOSIS — N2581 Secondary hyperparathyroidism of renal origin: Secondary | ICD-10-CM | POA: Diagnosis not present

## 2019-02-10 DIAGNOSIS — G4700 Insomnia, unspecified: Secondary | ICD-10-CM | POA: Diagnosis not present

## 2019-02-10 DIAGNOSIS — D500 Iron deficiency anemia secondary to blood loss (chronic): Secondary | ICD-10-CM | POA: Diagnosis not present

## 2019-02-10 DIAGNOSIS — E8809 Other disorders of plasma-protein metabolism, not elsewhere classified: Secondary | ICD-10-CM | POA: Diagnosis not present

## 2019-02-10 DIAGNOSIS — D631 Anemia in chronic kidney disease: Secondary | ICD-10-CM | POA: Diagnosis not present

## 2019-02-10 DIAGNOSIS — Z79899 Other long term (current) drug therapy: Secondary | ICD-10-CM | POA: Diagnosis not present

## 2019-02-10 DIAGNOSIS — N186 End stage renal disease: Secondary | ICD-10-CM | POA: Diagnosis not present

## 2019-02-10 DIAGNOSIS — M47896 Other spondylosis, lumbar region: Secondary | ICD-10-CM | POA: Diagnosis not present

## 2019-02-12 DIAGNOSIS — D631 Anemia in chronic kidney disease: Secondary | ICD-10-CM | POA: Diagnosis not present

## 2019-02-12 DIAGNOSIS — D500 Iron deficiency anemia secondary to blood loss (chronic): Secondary | ICD-10-CM | POA: Diagnosis not present

## 2019-02-12 DIAGNOSIS — E8809 Other disorders of plasma-protein metabolism, not elsewhere classified: Secondary | ICD-10-CM | POA: Diagnosis not present

## 2019-02-12 DIAGNOSIS — N186 End stage renal disease: Secondary | ICD-10-CM | POA: Diagnosis not present

## 2019-02-12 DIAGNOSIS — N2581 Secondary hyperparathyroidism of renal origin: Secondary | ICD-10-CM | POA: Diagnosis not present

## 2019-02-13 ENCOUNTER — Other Ambulatory Visit: Payer: Self-pay

## 2019-02-13 ENCOUNTER — Ambulatory Visit
Admission: RE | Admit: 2019-02-13 | Discharge: 2019-02-13 | Disposition: A | Discharge status: Home / Self Care | Payer: Medicare Other | Source: Ambulatory Visit | Attending: Urology | Admitting: Urology

## 2019-02-13 DIAGNOSIS — N289 Disorder of kidney and ureter, unspecified: Secondary | ICD-10-CM | POA: Diagnosis not present

## 2019-02-13 DIAGNOSIS — D3002 Benign neoplasm of left kidney: Secondary | ICD-10-CM

## 2019-02-14 DIAGNOSIS — D500 Iron deficiency anemia secondary to blood loss (chronic): Secondary | ICD-10-CM | POA: Diagnosis not present

## 2019-02-14 DIAGNOSIS — N2581 Secondary hyperparathyroidism of renal origin: Secondary | ICD-10-CM | POA: Diagnosis not present

## 2019-02-14 DIAGNOSIS — E8809 Other disorders of plasma-protein metabolism, not elsewhere classified: Secondary | ICD-10-CM | POA: Diagnosis not present

## 2019-02-14 DIAGNOSIS — N186 End stage renal disease: Secondary | ICD-10-CM | POA: Diagnosis not present

## 2019-02-14 DIAGNOSIS — D631 Anemia in chronic kidney disease: Secondary | ICD-10-CM | POA: Diagnosis not present

## 2019-02-17 DIAGNOSIS — D631 Anemia in chronic kidney disease: Secondary | ICD-10-CM | POA: Diagnosis not present

## 2019-02-17 DIAGNOSIS — N186 End stage renal disease: Secondary | ICD-10-CM | POA: Diagnosis not present

## 2019-02-17 DIAGNOSIS — D500 Iron deficiency anemia secondary to blood loss (chronic): Secondary | ICD-10-CM | POA: Diagnosis not present

## 2019-02-17 DIAGNOSIS — N2581 Secondary hyperparathyroidism of renal origin: Secondary | ICD-10-CM | POA: Diagnosis not present

## 2019-02-17 DIAGNOSIS — E8809 Other disorders of plasma-protein metabolism, not elsewhere classified: Secondary | ICD-10-CM | POA: Diagnosis not present

## 2019-02-18 DIAGNOSIS — Z992 Dependence on renal dialysis: Secondary | ICD-10-CM | POA: Diagnosis not present

## 2019-02-18 DIAGNOSIS — N186 End stage renal disease: Secondary | ICD-10-CM | POA: Diagnosis not present

## 2019-02-19 DIAGNOSIS — E1129 Type 2 diabetes mellitus with other diabetic kidney complication: Secondary | ICD-10-CM | POA: Diagnosis not present

## 2019-02-19 DIAGNOSIS — D500 Iron deficiency anemia secondary to blood loss (chronic): Secondary | ICD-10-CM | POA: Diagnosis not present

## 2019-02-19 DIAGNOSIS — N2581 Secondary hyperparathyroidism of renal origin: Secondary | ICD-10-CM | POA: Diagnosis not present

## 2019-02-19 DIAGNOSIS — N186 End stage renal disease: Secondary | ICD-10-CM | POA: Diagnosis not present

## 2019-02-19 DIAGNOSIS — E8809 Other disorders of plasma-protein metabolism, not elsewhere classified: Secondary | ICD-10-CM | POA: Diagnosis not present

## 2019-02-19 DIAGNOSIS — D631 Anemia in chronic kidney disease: Secondary | ICD-10-CM | POA: Diagnosis not present

## 2019-02-20 ENCOUNTER — Other Ambulatory Visit: Payer: Self-pay | Admitting: Family Medicine

## 2019-02-21 DIAGNOSIS — N186 End stage renal disease: Secondary | ICD-10-CM | POA: Diagnosis not present

## 2019-02-21 DIAGNOSIS — E1129 Type 2 diabetes mellitus with other diabetic kidney complication: Secondary | ICD-10-CM | POA: Diagnosis not present

## 2019-02-21 DIAGNOSIS — N2581 Secondary hyperparathyroidism of renal origin: Secondary | ICD-10-CM | POA: Diagnosis not present

## 2019-02-21 DIAGNOSIS — E8809 Other disorders of plasma-protein metabolism, not elsewhere classified: Secondary | ICD-10-CM | POA: Diagnosis not present

## 2019-02-21 DIAGNOSIS — D631 Anemia in chronic kidney disease: Secondary | ICD-10-CM | POA: Diagnosis not present

## 2019-02-24 DIAGNOSIS — E8809 Other disorders of plasma-protein metabolism, not elsewhere classified: Secondary | ICD-10-CM | POA: Diagnosis not present

## 2019-02-24 DIAGNOSIS — N2581 Secondary hyperparathyroidism of renal origin: Secondary | ICD-10-CM | POA: Diagnosis not present

## 2019-02-24 DIAGNOSIS — D631 Anemia in chronic kidney disease: Secondary | ICD-10-CM | POA: Diagnosis not present

## 2019-02-24 DIAGNOSIS — E1129 Type 2 diabetes mellitus with other diabetic kidney complication: Secondary | ICD-10-CM | POA: Diagnosis not present

## 2019-02-24 DIAGNOSIS — N186 End stage renal disease: Secondary | ICD-10-CM | POA: Diagnosis not present

## 2019-02-26 DIAGNOSIS — E1129 Type 2 diabetes mellitus with other diabetic kidney complication: Secondary | ICD-10-CM | POA: Diagnosis not present

## 2019-02-26 DIAGNOSIS — E8809 Other disorders of plasma-protein metabolism, not elsewhere classified: Secondary | ICD-10-CM | POA: Diagnosis not present

## 2019-02-26 DIAGNOSIS — N2581 Secondary hyperparathyroidism of renal origin: Secondary | ICD-10-CM | POA: Diagnosis not present

## 2019-02-26 DIAGNOSIS — N186 End stage renal disease: Secondary | ICD-10-CM | POA: Diagnosis not present

## 2019-02-26 DIAGNOSIS — D631 Anemia in chronic kidney disease: Secondary | ICD-10-CM | POA: Diagnosis not present

## 2019-02-28 ENCOUNTER — Other Ambulatory Visit: Payer: Self-pay | Admitting: Family Medicine

## 2019-02-28 ENCOUNTER — Telehealth: Payer: Self-pay | Admitting: *Deleted

## 2019-02-28 DIAGNOSIS — N186 End stage renal disease: Secondary | ICD-10-CM | POA: Diagnosis not present

## 2019-02-28 DIAGNOSIS — D631 Anemia in chronic kidney disease: Secondary | ICD-10-CM | POA: Diagnosis not present

## 2019-02-28 DIAGNOSIS — E8809 Other disorders of plasma-protein metabolism, not elsewhere classified: Secondary | ICD-10-CM | POA: Diagnosis not present

## 2019-02-28 DIAGNOSIS — N2581 Secondary hyperparathyroidism of renal origin: Secondary | ICD-10-CM | POA: Diagnosis not present

## 2019-02-28 DIAGNOSIS — E1129 Type 2 diabetes mellitus with other diabetic kidney complication: Secondary | ICD-10-CM | POA: Diagnosis not present

## 2019-02-28 NOTE — Telephone Encounter (Signed)
It seems like Novolog has been sent to his pharmacy already,02/24/19. Thanks, BJ

## 2019-02-28 NOTE — Telephone Encounter (Signed)
Insulin ASPA Flexpen Inj not on formulary for insurance, says to try Novolog U 100 or ASPART U 100

## 2019-02-28 NOTE — Telephone Encounter (Signed)
Noted. Nothing further needed at this time.  

## 2019-03-03 DIAGNOSIS — E1129 Type 2 diabetes mellitus with other diabetic kidney complication: Secondary | ICD-10-CM | POA: Diagnosis not present

## 2019-03-03 DIAGNOSIS — D631 Anemia in chronic kidney disease: Secondary | ICD-10-CM | POA: Diagnosis not present

## 2019-03-03 DIAGNOSIS — E8809 Other disorders of plasma-protein metabolism, not elsewhere classified: Secondary | ICD-10-CM | POA: Diagnosis not present

## 2019-03-03 DIAGNOSIS — N2581 Secondary hyperparathyroidism of renal origin: Secondary | ICD-10-CM | POA: Diagnosis not present

## 2019-03-03 DIAGNOSIS — N186 End stage renal disease: Secondary | ICD-10-CM | POA: Diagnosis not present

## 2019-03-04 ENCOUNTER — Other Ambulatory Visit: Payer: Self-pay | Admitting: Family Medicine

## 2019-03-04 DIAGNOSIS — I10 Essential (primary) hypertension: Secondary | ICD-10-CM

## 2019-03-05 DIAGNOSIS — N2581 Secondary hyperparathyroidism of renal origin: Secondary | ICD-10-CM | POA: Diagnosis not present

## 2019-03-05 DIAGNOSIS — E1129 Type 2 diabetes mellitus with other diabetic kidney complication: Secondary | ICD-10-CM | POA: Diagnosis not present

## 2019-03-05 DIAGNOSIS — E8809 Other disorders of plasma-protein metabolism, not elsewhere classified: Secondary | ICD-10-CM | POA: Diagnosis not present

## 2019-03-05 DIAGNOSIS — N186 End stage renal disease: Secondary | ICD-10-CM | POA: Diagnosis not present

## 2019-03-05 DIAGNOSIS — D631 Anemia in chronic kidney disease: Secondary | ICD-10-CM | POA: Diagnosis not present

## 2019-03-07 DIAGNOSIS — D631 Anemia in chronic kidney disease: Secondary | ICD-10-CM | POA: Diagnosis not present

## 2019-03-07 DIAGNOSIS — N186 End stage renal disease: Secondary | ICD-10-CM | POA: Diagnosis not present

## 2019-03-07 DIAGNOSIS — E1129 Type 2 diabetes mellitus with other diabetic kidney complication: Secondary | ICD-10-CM | POA: Diagnosis not present

## 2019-03-07 DIAGNOSIS — E8809 Other disorders of plasma-protein metabolism, not elsewhere classified: Secondary | ICD-10-CM | POA: Diagnosis not present

## 2019-03-07 DIAGNOSIS — N2581 Secondary hyperparathyroidism of renal origin: Secondary | ICD-10-CM | POA: Diagnosis not present

## 2019-03-10 DIAGNOSIS — E1129 Type 2 diabetes mellitus with other diabetic kidney complication: Secondary | ICD-10-CM | POA: Diagnosis not present

## 2019-03-10 DIAGNOSIS — N186 End stage renal disease: Secondary | ICD-10-CM | POA: Diagnosis not present

## 2019-03-10 DIAGNOSIS — E8809 Other disorders of plasma-protein metabolism, not elsewhere classified: Secondary | ICD-10-CM | POA: Diagnosis not present

## 2019-03-10 DIAGNOSIS — D631 Anemia in chronic kidney disease: Secondary | ICD-10-CM | POA: Diagnosis not present

## 2019-03-10 DIAGNOSIS — N2581 Secondary hyperparathyroidism of renal origin: Secondary | ICD-10-CM | POA: Diagnosis not present

## 2019-03-12 DIAGNOSIS — N2581 Secondary hyperparathyroidism of renal origin: Secondary | ICD-10-CM | POA: Diagnosis not present

## 2019-03-12 DIAGNOSIS — E8809 Other disorders of plasma-protein metabolism, not elsewhere classified: Secondary | ICD-10-CM | POA: Diagnosis not present

## 2019-03-12 DIAGNOSIS — D631 Anemia in chronic kidney disease: Secondary | ICD-10-CM | POA: Diagnosis not present

## 2019-03-12 DIAGNOSIS — E1129 Type 2 diabetes mellitus with other diabetic kidney complication: Secondary | ICD-10-CM | POA: Diagnosis not present

## 2019-03-12 DIAGNOSIS — N186 End stage renal disease: Secondary | ICD-10-CM | POA: Diagnosis not present

## 2019-03-14 DIAGNOSIS — E1129 Type 2 diabetes mellitus with other diabetic kidney complication: Secondary | ICD-10-CM | POA: Diagnosis not present

## 2019-03-14 DIAGNOSIS — D631 Anemia in chronic kidney disease: Secondary | ICD-10-CM | POA: Diagnosis not present

## 2019-03-14 DIAGNOSIS — N186 End stage renal disease: Secondary | ICD-10-CM | POA: Diagnosis not present

## 2019-03-14 DIAGNOSIS — N2581 Secondary hyperparathyroidism of renal origin: Secondary | ICD-10-CM | POA: Diagnosis not present

## 2019-03-14 DIAGNOSIS — E8809 Other disorders of plasma-protein metabolism, not elsewhere classified: Secondary | ICD-10-CM | POA: Diagnosis not present

## 2019-03-17 DIAGNOSIS — N186 End stage renal disease: Secondary | ICD-10-CM | POA: Diagnosis not present

## 2019-03-17 DIAGNOSIS — D631 Anemia in chronic kidney disease: Secondary | ICD-10-CM | POA: Diagnosis not present

## 2019-03-17 DIAGNOSIS — E8809 Other disorders of plasma-protein metabolism, not elsewhere classified: Secondary | ICD-10-CM | POA: Diagnosis not present

## 2019-03-17 DIAGNOSIS — N2581 Secondary hyperparathyroidism of renal origin: Secondary | ICD-10-CM | POA: Diagnosis not present

## 2019-03-17 DIAGNOSIS — E1129 Type 2 diabetes mellitus with other diabetic kidney complication: Secondary | ICD-10-CM | POA: Diagnosis not present

## 2019-03-18 ENCOUNTER — Other Ambulatory Visit: Payer: Self-pay | Admitting: Family Medicine

## 2019-03-18 DIAGNOSIS — E785 Hyperlipidemia, unspecified: Secondary | ICD-10-CM

## 2019-03-18 DIAGNOSIS — I10 Essential (primary) hypertension: Secondary | ICD-10-CM

## 2019-03-18 DIAGNOSIS — D3002 Benign neoplasm of left kidney: Secondary | ICD-10-CM | POA: Diagnosis not present

## 2019-03-19 DIAGNOSIS — D631 Anemia in chronic kidney disease: Secondary | ICD-10-CM | POA: Diagnosis not present

## 2019-03-19 DIAGNOSIS — N186 End stage renal disease: Secondary | ICD-10-CM | POA: Diagnosis not present

## 2019-03-19 DIAGNOSIS — E8809 Other disorders of plasma-protein metabolism, not elsewhere classified: Secondary | ICD-10-CM | POA: Diagnosis not present

## 2019-03-19 DIAGNOSIS — E1129 Type 2 diabetes mellitus with other diabetic kidney complication: Secondary | ICD-10-CM | POA: Diagnosis not present

## 2019-03-19 DIAGNOSIS — N2581 Secondary hyperparathyroidism of renal origin: Secondary | ICD-10-CM | POA: Diagnosis not present

## 2019-03-21 DIAGNOSIS — Z992 Dependence on renal dialysis: Secondary | ICD-10-CM | POA: Diagnosis not present

## 2019-03-21 DIAGNOSIS — D631 Anemia in chronic kidney disease: Secondary | ICD-10-CM | POA: Diagnosis not present

## 2019-03-21 DIAGNOSIS — E8809 Other disorders of plasma-protein metabolism, not elsewhere classified: Secondary | ICD-10-CM | POA: Diagnosis not present

## 2019-03-21 DIAGNOSIS — N186 End stage renal disease: Secondary | ICD-10-CM | POA: Diagnosis not present

## 2019-03-21 DIAGNOSIS — N2581 Secondary hyperparathyroidism of renal origin: Secondary | ICD-10-CM | POA: Diagnosis not present

## 2019-03-21 DIAGNOSIS — E1129 Type 2 diabetes mellitus with other diabetic kidney complication: Secondary | ICD-10-CM | POA: Diagnosis not present

## 2019-03-24 DIAGNOSIS — Z992 Dependence on renal dialysis: Secondary | ICD-10-CM | POA: Diagnosis not present

## 2019-03-24 DIAGNOSIS — N186 End stage renal disease: Secondary | ICD-10-CM | POA: Diagnosis not present

## 2019-03-24 DIAGNOSIS — N2581 Secondary hyperparathyroidism of renal origin: Secondary | ICD-10-CM | POA: Diagnosis not present

## 2019-03-24 DIAGNOSIS — Z23 Encounter for immunization: Secondary | ICD-10-CM | POA: Diagnosis not present

## 2019-03-24 DIAGNOSIS — D500 Iron deficiency anemia secondary to blood loss (chronic): Secondary | ICD-10-CM | POA: Diagnosis not present

## 2019-03-24 DIAGNOSIS — E8809 Other disorders of plasma-protein metabolism, not elsewhere classified: Secondary | ICD-10-CM | POA: Diagnosis not present

## 2019-03-24 DIAGNOSIS — D631 Anemia in chronic kidney disease: Secondary | ICD-10-CM | POA: Diagnosis not present

## 2019-03-26 DIAGNOSIS — N2581 Secondary hyperparathyroidism of renal origin: Secondary | ICD-10-CM | POA: Diagnosis not present

## 2019-03-26 DIAGNOSIS — D631 Anemia in chronic kidney disease: Secondary | ICD-10-CM | POA: Diagnosis not present

## 2019-03-26 DIAGNOSIS — D500 Iron deficiency anemia secondary to blood loss (chronic): Secondary | ICD-10-CM | POA: Diagnosis not present

## 2019-03-26 DIAGNOSIS — Z992 Dependence on renal dialysis: Secondary | ICD-10-CM | POA: Diagnosis not present

## 2019-03-26 DIAGNOSIS — N186 End stage renal disease: Secondary | ICD-10-CM | POA: Diagnosis not present

## 2019-03-28 DIAGNOSIS — D500 Iron deficiency anemia secondary to blood loss (chronic): Secondary | ICD-10-CM | POA: Diagnosis not present

## 2019-03-28 DIAGNOSIS — N186 End stage renal disease: Secondary | ICD-10-CM | POA: Diagnosis not present

## 2019-03-28 DIAGNOSIS — N2581 Secondary hyperparathyroidism of renal origin: Secondary | ICD-10-CM | POA: Diagnosis not present

## 2019-03-28 DIAGNOSIS — Z992 Dependence on renal dialysis: Secondary | ICD-10-CM | POA: Diagnosis not present

## 2019-03-28 DIAGNOSIS — D631 Anemia in chronic kidney disease: Secondary | ICD-10-CM | POA: Diagnosis not present

## 2019-03-31 DIAGNOSIS — N2581 Secondary hyperparathyroidism of renal origin: Secondary | ICD-10-CM | POA: Diagnosis not present

## 2019-03-31 DIAGNOSIS — D631 Anemia in chronic kidney disease: Secondary | ICD-10-CM | POA: Diagnosis not present

## 2019-03-31 DIAGNOSIS — N186 End stage renal disease: Secondary | ICD-10-CM | POA: Diagnosis not present

## 2019-03-31 DIAGNOSIS — D500 Iron deficiency anemia secondary to blood loss (chronic): Secondary | ICD-10-CM | POA: Diagnosis not present

## 2019-03-31 DIAGNOSIS — Z992 Dependence on renal dialysis: Secondary | ICD-10-CM | POA: Diagnosis not present

## 2019-04-02 DIAGNOSIS — D500 Iron deficiency anemia secondary to blood loss (chronic): Secondary | ICD-10-CM | POA: Diagnosis not present

## 2019-04-02 DIAGNOSIS — N2581 Secondary hyperparathyroidism of renal origin: Secondary | ICD-10-CM | POA: Diagnosis not present

## 2019-04-02 DIAGNOSIS — Z992 Dependence on renal dialysis: Secondary | ICD-10-CM | POA: Diagnosis not present

## 2019-04-02 DIAGNOSIS — D631 Anemia in chronic kidney disease: Secondary | ICD-10-CM | POA: Diagnosis not present

## 2019-04-02 DIAGNOSIS — N186 End stage renal disease: Secondary | ICD-10-CM | POA: Diagnosis not present

## 2019-04-04 DIAGNOSIS — D631 Anemia in chronic kidney disease: Secondary | ICD-10-CM | POA: Diagnosis not present

## 2019-04-04 DIAGNOSIS — N2581 Secondary hyperparathyroidism of renal origin: Secondary | ICD-10-CM | POA: Diagnosis not present

## 2019-04-04 DIAGNOSIS — Z992 Dependence on renal dialysis: Secondary | ICD-10-CM | POA: Diagnosis not present

## 2019-04-04 DIAGNOSIS — D500 Iron deficiency anemia secondary to blood loss (chronic): Secondary | ICD-10-CM | POA: Diagnosis not present

## 2019-04-04 DIAGNOSIS — N186 End stage renal disease: Secondary | ICD-10-CM | POA: Diagnosis not present

## 2019-04-07 DIAGNOSIS — N2581 Secondary hyperparathyroidism of renal origin: Secondary | ICD-10-CM | POA: Diagnosis not present

## 2019-04-07 DIAGNOSIS — D500 Iron deficiency anemia secondary to blood loss (chronic): Secondary | ICD-10-CM | POA: Diagnosis not present

## 2019-04-07 DIAGNOSIS — N186 End stage renal disease: Secondary | ICD-10-CM | POA: Diagnosis not present

## 2019-04-07 DIAGNOSIS — D631 Anemia in chronic kidney disease: Secondary | ICD-10-CM | POA: Diagnosis not present

## 2019-04-07 DIAGNOSIS — Z992 Dependence on renal dialysis: Secondary | ICD-10-CM | POA: Diagnosis not present

## 2019-04-08 ENCOUNTER — Ambulatory Visit: Payer: Medicare Other

## 2019-04-09 DIAGNOSIS — D500 Iron deficiency anemia secondary to blood loss (chronic): Secondary | ICD-10-CM | POA: Diagnosis not present

## 2019-04-09 DIAGNOSIS — N2581 Secondary hyperparathyroidism of renal origin: Secondary | ICD-10-CM | POA: Diagnosis not present

## 2019-04-09 DIAGNOSIS — D631 Anemia in chronic kidney disease: Secondary | ICD-10-CM | POA: Diagnosis not present

## 2019-04-09 DIAGNOSIS — N186 End stage renal disease: Secondary | ICD-10-CM | POA: Diagnosis not present

## 2019-04-09 DIAGNOSIS — Z992 Dependence on renal dialysis: Secondary | ICD-10-CM | POA: Diagnosis not present

## 2019-04-11 DIAGNOSIS — N2581 Secondary hyperparathyroidism of renal origin: Secondary | ICD-10-CM | POA: Diagnosis not present

## 2019-04-11 DIAGNOSIS — D631 Anemia in chronic kidney disease: Secondary | ICD-10-CM | POA: Diagnosis not present

## 2019-04-11 DIAGNOSIS — N186 End stage renal disease: Secondary | ICD-10-CM | POA: Diagnosis not present

## 2019-04-11 DIAGNOSIS — D500 Iron deficiency anemia secondary to blood loss (chronic): Secondary | ICD-10-CM | POA: Diagnosis not present

## 2019-04-11 DIAGNOSIS — Z992 Dependence on renal dialysis: Secondary | ICD-10-CM | POA: Diagnosis not present

## 2019-04-14 DIAGNOSIS — Z992 Dependence on renal dialysis: Secondary | ICD-10-CM | POA: Diagnosis not present

## 2019-04-14 DIAGNOSIS — D500 Iron deficiency anemia secondary to blood loss (chronic): Secondary | ICD-10-CM | POA: Diagnosis not present

## 2019-04-14 DIAGNOSIS — D631 Anemia in chronic kidney disease: Secondary | ICD-10-CM | POA: Diagnosis not present

## 2019-04-14 DIAGNOSIS — N186 End stage renal disease: Secondary | ICD-10-CM | POA: Diagnosis not present

## 2019-04-14 DIAGNOSIS — N2581 Secondary hyperparathyroidism of renal origin: Secondary | ICD-10-CM | POA: Diagnosis not present

## 2019-04-16 DIAGNOSIS — D500 Iron deficiency anemia secondary to blood loss (chronic): Secondary | ICD-10-CM | POA: Diagnosis not present

## 2019-04-16 DIAGNOSIS — D631 Anemia in chronic kidney disease: Secondary | ICD-10-CM | POA: Diagnosis not present

## 2019-04-16 DIAGNOSIS — N2581 Secondary hyperparathyroidism of renal origin: Secondary | ICD-10-CM | POA: Diagnosis not present

## 2019-04-16 DIAGNOSIS — Z992 Dependence on renal dialysis: Secondary | ICD-10-CM | POA: Diagnosis not present

## 2019-04-16 DIAGNOSIS — N186 End stage renal disease: Secondary | ICD-10-CM | POA: Diagnosis not present

## 2019-04-18 DIAGNOSIS — D500 Iron deficiency anemia secondary to blood loss (chronic): Secondary | ICD-10-CM | POA: Diagnosis not present

## 2019-04-18 DIAGNOSIS — N186 End stage renal disease: Secondary | ICD-10-CM | POA: Diagnosis not present

## 2019-04-18 DIAGNOSIS — D631 Anemia in chronic kidney disease: Secondary | ICD-10-CM | POA: Diagnosis not present

## 2019-04-18 DIAGNOSIS — Z992 Dependence on renal dialysis: Secondary | ICD-10-CM | POA: Diagnosis not present

## 2019-04-18 DIAGNOSIS — N2581 Secondary hyperparathyroidism of renal origin: Secondary | ICD-10-CM | POA: Diagnosis not present

## 2019-04-21 DIAGNOSIS — N2581 Secondary hyperparathyroidism of renal origin: Secondary | ICD-10-CM | POA: Diagnosis not present

## 2019-04-21 DIAGNOSIS — Z992 Dependence on renal dialysis: Secondary | ICD-10-CM | POA: Diagnosis not present

## 2019-04-21 DIAGNOSIS — N186 End stage renal disease: Secondary | ICD-10-CM | POA: Diagnosis not present

## 2019-04-21 DIAGNOSIS — D500 Iron deficiency anemia secondary to blood loss (chronic): Secondary | ICD-10-CM | POA: Diagnosis not present

## 2019-04-21 DIAGNOSIS — D631 Anemia in chronic kidney disease: Secondary | ICD-10-CM | POA: Diagnosis not present

## 2019-04-23 DIAGNOSIS — D500 Iron deficiency anemia secondary to blood loss (chronic): Secondary | ICD-10-CM | POA: Diagnosis not present

## 2019-04-23 DIAGNOSIS — N186 End stage renal disease: Secondary | ICD-10-CM | POA: Diagnosis not present

## 2019-04-23 DIAGNOSIS — Z23 Encounter for immunization: Secondary | ICD-10-CM | POA: Diagnosis not present

## 2019-04-23 DIAGNOSIS — N2581 Secondary hyperparathyroidism of renal origin: Secondary | ICD-10-CM | POA: Diagnosis not present

## 2019-04-23 DIAGNOSIS — Z992 Dependence on renal dialysis: Secondary | ICD-10-CM | POA: Diagnosis not present

## 2019-04-23 DIAGNOSIS — D631 Anemia in chronic kidney disease: Secondary | ICD-10-CM | POA: Diagnosis not present

## 2019-04-23 DIAGNOSIS — D6959 Other secondary thrombocytopenia: Secondary | ICD-10-CM | POA: Diagnosis not present

## 2019-04-25 DIAGNOSIS — D6959 Other secondary thrombocytopenia: Secondary | ICD-10-CM | POA: Diagnosis not present

## 2019-04-25 DIAGNOSIS — D500 Iron deficiency anemia secondary to blood loss (chronic): Secondary | ICD-10-CM | POA: Diagnosis not present

## 2019-04-25 DIAGNOSIS — N186 End stage renal disease: Secondary | ICD-10-CM | POA: Diagnosis not present

## 2019-04-25 DIAGNOSIS — D631 Anemia in chronic kidney disease: Secondary | ICD-10-CM | POA: Diagnosis not present

## 2019-04-25 DIAGNOSIS — N2581 Secondary hyperparathyroidism of renal origin: Secondary | ICD-10-CM | POA: Diagnosis not present

## 2019-04-25 DIAGNOSIS — Z992 Dependence on renal dialysis: Secondary | ICD-10-CM | POA: Diagnosis not present

## 2019-04-28 DIAGNOSIS — N186 End stage renal disease: Secondary | ICD-10-CM | POA: Diagnosis not present

## 2019-04-28 DIAGNOSIS — D6959 Other secondary thrombocytopenia: Secondary | ICD-10-CM | POA: Diagnosis not present

## 2019-04-28 DIAGNOSIS — N2581 Secondary hyperparathyroidism of renal origin: Secondary | ICD-10-CM | POA: Diagnosis not present

## 2019-04-28 DIAGNOSIS — D500 Iron deficiency anemia secondary to blood loss (chronic): Secondary | ICD-10-CM | POA: Diagnosis not present

## 2019-04-28 DIAGNOSIS — D631 Anemia in chronic kidney disease: Secondary | ICD-10-CM | POA: Diagnosis not present

## 2019-04-28 DIAGNOSIS — Z992 Dependence on renal dialysis: Secondary | ICD-10-CM | POA: Diagnosis not present

## 2019-04-29 DIAGNOSIS — G4733 Obstructive sleep apnea (adult) (pediatric): Secondary | ICD-10-CM | POA: Diagnosis not present

## 2019-04-30 DIAGNOSIS — E1129 Type 2 diabetes mellitus with other diabetic kidney complication: Secondary | ICD-10-CM | POA: Diagnosis not present

## 2019-04-30 DIAGNOSIS — N2581 Secondary hyperparathyroidism of renal origin: Secondary | ICD-10-CM | POA: Diagnosis not present

## 2019-04-30 DIAGNOSIS — D631 Anemia in chronic kidney disease: Secondary | ICD-10-CM | POA: Diagnosis not present

## 2019-04-30 DIAGNOSIS — D6959 Other secondary thrombocytopenia: Secondary | ICD-10-CM | POA: Diagnosis not present

## 2019-04-30 DIAGNOSIS — N186 End stage renal disease: Secondary | ICD-10-CM | POA: Diagnosis not present

## 2019-04-30 DIAGNOSIS — D500 Iron deficiency anemia secondary to blood loss (chronic): Secondary | ICD-10-CM | POA: Diagnosis not present

## 2019-04-30 DIAGNOSIS — Z992 Dependence on renal dialysis: Secondary | ICD-10-CM | POA: Diagnosis not present

## 2019-05-01 DIAGNOSIS — I10 Essential (primary) hypertension: Secondary | ICD-10-CM | POA: Diagnosis not present

## 2019-05-01 DIAGNOSIS — N186 End stage renal disease: Secondary | ICD-10-CM | POA: Diagnosis not present

## 2019-05-01 DIAGNOSIS — E785 Hyperlipidemia, unspecified: Secondary | ICD-10-CM | POA: Diagnosis not present

## 2019-05-01 DIAGNOSIS — I872 Venous insufficiency (chronic) (peripheral): Secondary | ICD-10-CM | POA: Diagnosis not present

## 2019-05-01 DIAGNOSIS — I2510 Atherosclerotic heart disease of native coronary artery without angina pectoris: Secondary | ICD-10-CM | POA: Diagnosis not present

## 2019-05-02 DIAGNOSIS — D631 Anemia in chronic kidney disease: Secondary | ICD-10-CM | POA: Diagnosis not present

## 2019-05-02 DIAGNOSIS — N186 End stage renal disease: Secondary | ICD-10-CM | POA: Diagnosis not present

## 2019-05-02 DIAGNOSIS — N2581 Secondary hyperparathyroidism of renal origin: Secondary | ICD-10-CM | POA: Diagnosis not present

## 2019-05-02 DIAGNOSIS — D6959 Other secondary thrombocytopenia: Secondary | ICD-10-CM | POA: Diagnosis not present

## 2019-05-02 DIAGNOSIS — Z992 Dependence on renal dialysis: Secondary | ICD-10-CM | POA: Diagnosis not present

## 2019-05-02 DIAGNOSIS — D500 Iron deficiency anemia secondary to blood loss (chronic): Secondary | ICD-10-CM | POA: Diagnosis not present

## 2019-05-05 DIAGNOSIS — N186 End stage renal disease: Secondary | ICD-10-CM | POA: Diagnosis not present

## 2019-05-05 DIAGNOSIS — D6959 Other secondary thrombocytopenia: Secondary | ICD-10-CM | POA: Diagnosis not present

## 2019-05-05 DIAGNOSIS — N2581 Secondary hyperparathyroidism of renal origin: Secondary | ICD-10-CM | POA: Diagnosis not present

## 2019-05-05 DIAGNOSIS — D631 Anemia in chronic kidney disease: Secondary | ICD-10-CM | POA: Diagnosis not present

## 2019-05-05 DIAGNOSIS — D500 Iron deficiency anemia secondary to blood loss (chronic): Secondary | ICD-10-CM | POA: Diagnosis not present

## 2019-05-05 DIAGNOSIS — Z992 Dependence on renal dialysis: Secondary | ICD-10-CM | POA: Diagnosis not present

## 2019-05-06 ENCOUNTER — Other Ambulatory Visit: Payer: Self-pay | Admitting: Family Medicine

## 2019-05-07 DIAGNOSIS — D631 Anemia in chronic kidney disease: Secondary | ICD-10-CM | POA: Diagnosis not present

## 2019-05-07 DIAGNOSIS — D6959 Other secondary thrombocytopenia: Secondary | ICD-10-CM | POA: Diagnosis not present

## 2019-05-07 DIAGNOSIS — D500 Iron deficiency anemia secondary to blood loss (chronic): Secondary | ICD-10-CM | POA: Diagnosis not present

## 2019-05-07 DIAGNOSIS — Z992 Dependence on renal dialysis: Secondary | ICD-10-CM | POA: Diagnosis not present

## 2019-05-07 DIAGNOSIS — N2581 Secondary hyperparathyroidism of renal origin: Secondary | ICD-10-CM | POA: Diagnosis not present

## 2019-05-07 DIAGNOSIS — N186 End stage renal disease: Secondary | ICD-10-CM | POA: Diagnosis not present

## 2019-05-09 DIAGNOSIS — D500 Iron deficiency anemia secondary to blood loss (chronic): Secondary | ICD-10-CM | POA: Diagnosis not present

## 2019-05-09 DIAGNOSIS — D6959 Other secondary thrombocytopenia: Secondary | ICD-10-CM | POA: Diagnosis not present

## 2019-05-09 DIAGNOSIS — N2581 Secondary hyperparathyroidism of renal origin: Secondary | ICD-10-CM | POA: Diagnosis not present

## 2019-05-09 DIAGNOSIS — Z992 Dependence on renal dialysis: Secondary | ICD-10-CM | POA: Diagnosis not present

## 2019-05-09 DIAGNOSIS — N186 End stage renal disease: Secondary | ICD-10-CM | POA: Diagnosis not present

## 2019-05-09 DIAGNOSIS — D631 Anemia in chronic kidney disease: Secondary | ICD-10-CM | POA: Diagnosis not present

## 2019-05-12 DIAGNOSIS — D6959 Other secondary thrombocytopenia: Secondary | ICD-10-CM | POA: Diagnosis not present

## 2019-05-12 DIAGNOSIS — N186 End stage renal disease: Secondary | ICD-10-CM | POA: Diagnosis not present

## 2019-05-12 DIAGNOSIS — N2581 Secondary hyperparathyroidism of renal origin: Secondary | ICD-10-CM | POA: Diagnosis not present

## 2019-05-12 DIAGNOSIS — Z992 Dependence on renal dialysis: Secondary | ICD-10-CM | POA: Diagnosis not present

## 2019-05-12 DIAGNOSIS — D631 Anemia in chronic kidney disease: Secondary | ICD-10-CM | POA: Diagnosis not present

## 2019-05-12 DIAGNOSIS — D500 Iron deficiency anemia secondary to blood loss (chronic): Secondary | ICD-10-CM | POA: Diagnosis not present

## 2019-05-13 DIAGNOSIS — E1140 Type 2 diabetes mellitus with diabetic neuropathy, unspecified: Secondary | ICD-10-CM | POA: Diagnosis not present

## 2019-05-13 DIAGNOSIS — Z992 Dependence on renal dialysis: Secondary | ICD-10-CM | POA: Diagnosis not present

## 2019-05-13 DIAGNOSIS — M79673 Pain in unspecified foot: Secondary | ICD-10-CM | POA: Diagnosis not present

## 2019-05-13 DIAGNOSIS — B351 Tinea unguium: Secondary | ICD-10-CM | POA: Diagnosis not present

## 2019-05-14 DIAGNOSIS — D500 Iron deficiency anemia secondary to blood loss (chronic): Secondary | ICD-10-CM | POA: Diagnosis not present

## 2019-05-14 DIAGNOSIS — D6959 Other secondary thrombocytopenia: Secondary | ICD-10-CM | POA: Diagnosis not present

## 2019-05-14 DIAGNOSIS — Z992 Dependence on renal dialysis: Secondary | ICD-10-CM | POA: Diagnosis not present

## 2019-05-14 DIAGNOSIS — N2581 Secondary hyperparathyroidism of renal origin: Secondary | ICD-10-CM | POA: Diagnosis not present

## 2019-05-14 DIAGNOSIS — N186 End stage renal disease: Secondary | ICD-10-CM | POA: Diagnosis not present

## 2019-05-14 DIAGNOSIS — D631 Anemia in chronic kidney disease: Secondary | ICD-10-CM | POA: Diagnosis not present

## 2019-05-16 DIAGNOSIS — N2581 Secondary hyperparathyroidism of renal origin: Secondary | ICD-10-CM | POA: Diagnosis not present

## 2019-05-16 DIAGNOSIS — N186 End stage renal disease: Secondary | ICD-10-CM | POA: Diagnosis not present

## 2019-05-16 DIAGNOSIS — D500 Iron deficiency anemia secondary to blood loss (chronic): Secondary | ICD-10-CM | POA: Diagnosis not present

## 2019-05-16 DIAGNOSIS — D6959 Other secondary thrombocytopenia: Secondary | ICD-10-CM | POA: Diagnosis not present

## 2019-05-16 DIAGNOSIS — D631 Anemia in chronic kidney disease: Secondary | ICD-10-CM | POA: Diagnosis not present

## 2019-05-16 DIAGNOSIS — Z992 Dependence on renal dialysis: Secondary | ICD-10-CM | POA: Diagnosis not present

## 2019-05-19 DIAGNOSIS — M47896 Other spondylosis, lumbar region: Secondary | ICD-10-CM | POA: Diagnosis not present

## 2019-05-19 DIAGNOSIS — G8929 Other chronic pain: Secondary | ICD-10-CM | POA: Diagnosis not present

## 2019-05-19 DIAGNOSIS — Z79899 Other long term (current) drug therapy: Secondary | ICD-10-CM | POA: Diagnosis not present

## 2019-05-19 DIAGNOSIS — N2581 Secondary hyperparathyroidism of renal origin: Secondary | ICD-10-CM | POA: Diagnosis not present

## 2019-05-19 DIAGNOSIS — D500 Iron deficiency anemia secondary to blood loss (chronic): Secondary | ICD-10-CM | POA: Diagnosis not present

## 2019-05-19 DIAGNOSIS — G4700 Insomnia, unspecified: Secondary | ICD-10-CM | POA: Diagnosis not present

## 2019-05-19 DIAGNOSIS — D631 Anemia in chronic kidney disease: Secondary | ICD-10-CM | POA: Diagnosis not present

## 2019-05-19 DIAGNOSIS — Z992 Dependence on renal dialysis: Secondary | ICD-10-CM | POA: Diagnosis not present

## 2019-05-19 DIAGNOSIS — D6959 Other secondary thrombocytopenia: Secondary | ICD-10-CM | POA: Diagnosis not present

## 2019-05-19 DIAGNOSIS — N186 End stage renal disease: Secondary | ICD-10-CM | POA: Diagnosis not present

## 2019-05-20 ENCOUNTER — Other Ambulatory Visit: Payer: Self-pay | Admitting: Family Medicine

## 2019-05-20 DIAGNOSIS — I10 Essential (primary) hypertension: Secondary | ICD-10-CM

## 2019-05-21 DIAGNOSIS — N186 End stage renal disease: Secondary | ICD-10-CM | POA: Diagnosis not present

## 2019-05-21 DIAGNOSIS — D500 Iron deficiency anemia secondary to blood loss (chronic): Secondary | ICD-10-CM | POA: Diagnosis not present

## 2019-05-21 DIAGNOSIS — D631 Anemia in chronic kidney disease: Secondary | ICD-10-CM | POA: Diagnosis not present

## 2019-05-21 DIAGNOSIS — Z992 Dependence on renal dialysis: Secondary | ICD-10-CM | POA: Diagnosis not present

## 2019-05-21 DIAGNOSIS — N2581 Secondary hyperparathyroidism of renal origin: Secondary | ICD-10-CM | POA: Diagnosis not present

## 2019-05-21 DIAGNOSIS — D6959 Other secondary thrombocytopenia: Secondary | ICD-10-CM | POA: Diagnosis not present

## 2019-05-23 DIAGNOSIS — N186 End stage renal disease: Secondary | ICD-10-CM | POA: Diagnosis not present

## 2019-05-23 DIAGNOSIS — D500 Iron deficiency anemia secondary to blood loss (chronic): Secondary | ICD-10-CM | POA: Diagnosis not present

## 2019-05-23 DIAGNOSIS — N2581 Secondary hyperparathyroidism of renal origin: Secondary | ICD-10-CM | POA: Diagnosis not present

## 2019-05-23 DIAGNOSIS — D631 Anemia in chronic kidney disease: Secondary | ICD-10-CM | POA: Diagnosis not present

## 2019-05-23 DIAGNOSIS — E8809 Other disorders of plasma-protein metabolism, not elsewhere classified: Secondary | ICD-10-CM | POA: Diagnosis not present

## 2019-05-23 DIAGNOSIS — Z992 Dependence on renal dialysis: Secondary | ICD-10-CM | POA: Diagnosis not present

## 2019-05-26 DIAGNOSIS — N2581 Secondary hyperparathyroidism of renal origin: Secondary | ICD-10-CM | POA: Diagnosis not present

## 2019-05-26 DIAGNOSIS — D500 Iron deficiency anemia secondary to blood loss (chronic): Secondary | ICD-10-CM | POA: Diagnosis not present

## 2019-05-26 DIAGNOSIS — N186 End stage renal disease: Secondary | ICD-10-CM | POA: Diagnosis not present

## 2019-05-26 DIAGNOSIS — Z992 Dependence on renal dialysis: Secondary | ICD-10-CM | POA: Diagnosis not present

## 2019-05-26 DIAGNOSIS — D631 Anemia in chronic kidney disease: Secondary | ICD-10-CM | POA: Diagnosis not present

## 2019-05-28 DIAGNOSIS — Z992 Dependence on renal dialysis: Secondary | ICD-10-CM | POA: Diagnosis not present

## 2019-05-28 DIAGNOSIS — N2581 Secondary hyperparathyroidism of renal origin: Secondary | ICD-10-CM | POA: Diagnosis not present

## 2019-05-28 DIAGNOSIS — D631 Anemia in chronic kidney disease: Secondary | ICD-10-CM | POA: Diagnosis not present

## 2019-05-28 DIAGNOSIS — N186 End stage renal disease: Secondary | ICD-10-CM | POA: Diagnosis not present

## 2019-05-28 DIAGNOSIS — D500 Iron deficiency anemia secondary to blood loss (chronic): Secondary | ICD-10-CM | POA: Diagnosis not present

## 2019-05-30 DIAGNOSIS — N186 End stage renal disease: Secondary | ICD-10-CM | POA: Diagnosis not present

## 2019-05-30 DIAGNOSIS — D631 Anemia in chronic kidney disease: Secondary | ICD-10-CM | POA: Diagnosis not present

## 2019-05-30 DIAGNOSIS — N2581 Secondary hyperparathyroidism of renal origin: Secondary | ICD-10-CM | POA: Diagnosis not present

## 2019-05-30 DIAGNOSIS — D500 Iron deficiency anemia secondary to blood loss (chronic): Secondary | ICD-10-CM | POA: Diagnosis not present

## 2019-05-30 DIAGNOSIS — Z992 Dependence on renal dialysis: Secondary | ICD-10-CM | POA: Diagnosis not present

## 2019-06-02 DIAGNOSIS — D631 Anemia in chronic kidney disease: Secondary | ICD-10-CM | POA: Diagnosis not present

## 2019-06-02 DIAGNOSIS — N186 End stage renal disease: Secondary | ICD-10-CM | POA: Diagnosis not present

## 2019-06-02 DIAGNOSIS — D500 Iron deficiency anemia secondary to blood loss (chronic): Secondary | ICD-10-CM | POA: Diagnosis not present

## 2019-06-02 DIAGNOSIS — Z992 Dependence on renal dialysis: Secondary | ICD-10-CM | POA: Diagnosis not present

## 2019-06-02 DIAGNOSIS — N2581 Secondary hyperparathyroidism of renal origin: Secondary | ICD-10-CM | POA: Diagnosis not present

## 2019-06-04 DIAGNOSIS — D500 Iron deficiency anemia secondary to blood loss (chronic): Secondary | ICD-10-CM | POA: Diagnosis not present

## 2019-06-04 DIAGNOSIS — D631 Anemia in chronic kidney disease: Secondary | ICD-10-CM | POA: Diagnosis not present

## 2019-06-04 DIAGNOSIS — Z992 Dependence on renal dialysis: Secondary | ICD-10-CM | POA: Diagnosis not present

## 2019-06-04 DIAGNOSIS — N186 End stage renal disease: Secondary | ICD-10-CM | POA: Diagnosis not present

## 2019-06-04 DIAGNOSIS — N2581 Secondary hyperparathyroidism of renal origin: Secondary | ICD-10-CM | POA: Diagnosis not present

## 2019-06-06 DIAGNOSIS — D631 Anemia in chronic kidney disease: Secondary | ICD-10-CM | POA: Diagnosis not present

## 2019-06-06 DIAGNOSIS — N2581 Secondary hyperparathyroidism of renal origin: Secondary | ICD-10-CM | POA: Diagnosis not present

## 2019-06-06 DIAGNOSIS — D500 Iron deficiency anemia secondary to blood loss (chronic): Secondary | ICD-10-CM | POA: Diagnosis not present

## 2019-06-06 DIAGNOSIS — N186 End stage renal disease: Secondary | ICD-10-CM | POA: Diagnosis not present

## 2019-06-06 DIAGNOSIS — Z992 Dependence on renal dialysis: Secondary | ICD-10-CM | POA: Diagnosis not present

## 2019-06-09 DIAGNOSIS — N186 End stage renal disease: Secondary | ICD-10-CM | POA: Diagnosis not present

## 2019-06-09 DIAGNOSIS — D500 Iron deficiency anemia secondary to blood loss (chronic): Secondary | ICD-10-CM | POA: Diagnosis not present

## 2019-06-09 DIAGNOSIS — D631 Anemia in chronic kidney disease: Secondary | ICD-10-CM | POA: Diagnosis not present

## 2019-06-09 DIAGNOSIS — N2581 Secondary hyperparathyroidism of renal origin: Secondary | ICD-10-CM | POA: Diagnosis not present

## 2019-06-09 DIAGNOSIS — Z992 Dependence on renal dialysis: Secondary | ICD-10-CM | POA: Diagnosis not present

## 2019-06-11 DIAGNOSIS — Z992 Dependence on renal dialysis: Secondary | ICD-10-CM | POA: Diagnosis not present

## 2019-06-11 DIAGNOSIS — N186 End stage renal disease: Secondary | ICD-10-CM | POA: Diagnosis not present

## 2019-06-11 DIAGNOSIS — D500 Iron deficiency anemia secondary to blood loss (chronic): Secondary | ICD-10-CM | POA: Diagnosis not present

## 2019-06-11 DIAGNOSIS — D631 Anemia in chronic kidney disease: Secondary | ICD-10-CM | POA: Diagnosis not present

## 2019-06-11 DIAGNOSIS — N2581 Secondary hyperparathyroidism of renal origin: Secondary | ICD-10-CM | POA: Diagnosis not present

## 2019-06-13 DIAGNOSIS — D500 Iron deficiency anemia secondary to blood loss (chronic): Secondary | ICD-10-CM | POA: Diagnosis not present

## 2019-06-13 DIAGNOSIS — N2581 Secondary hyperparathyroidism of renal origin: Secondary | ICD-10-CM | POA: Diagnosis not present

## 2019-06-13 DIAGNOSIS — D631 Anemia in chronic kidney disease: Secondary | ICD-10-CM | POA: Diagnosis not present

## 2019-06-13 DIAGNOSIS — Z992 Dependence on renal dialysis: Secondary | ICD-10-CM | POA: Diagnosis not present

## 2019-06-13 DIAGNOSIS — N186 End stage renal disease: Secondary | ICD-10-CM | POA: Diagnosis not present

## 2019-06-16 DIAGNOSIS — D631 Anemia in chronic kidney disease: Secondary | ICD-10-CM | POA: Diagnosis not present

## 2019-06-16 DIAGNOSIS — N2581 Secondary hyperparathyroidism of renal origin: Secondary | ICD-10-CM | POA: Diagnosis not present

## 2019-06-16 DIAGNOSIS — D500 Iron deficiency anemia secondary to blood loss (chronic): Secondary | ICD-10-CM | POA: Diagnosis not present

## 2019-06-16 DIAGNOSIS — Z992 Dependence on renal dialysis: Secondary | ICD-10-CM | POA: Diagnosis not present

## 2019-06-16 DIAGNOSIS — N186 End stage renal disease: Secondary | ICD-10-CM | POA: Diagnosis not present

## 2019-06-17 ENCOUNTER — Other Ambulatory Visit: Payer: Self-pay | Admitting: Family Medicine

## 2019-06-17 DIAGNOSIS — I10 Essential (primary) hypertension: Secondary | ICD-10-CM

## 2019-06-18 DIAGNOSIS — N2581 Secondary hyperparathyroidism of renal origin: Secondary | ICD-10-CM | POA: Diagnosis not present

## 2019-06-18 DIAGNOSIS — N186 End stage renal disease: Secondary | ICD-10-CM | POA: Diagnosis not present

## 2019-06-18 DIAGNOSIS — D631 Anemia in chronic kidney disease: Secondary | ICD-10-CM | POA: Diagnosis not present

## 2019-06-18 DIAGNOSIS — Z992 Dependence on renal dialysis: Secondary | ICD-10-CM | POA: Diagnosis not present

## 2019-06-18 DIAGNOSIS — D500 Iron deficiency anemia secondary to blood loss (chronic): Secondary | ICD-10-CM | POA: Diagnosis not present

## 2019-06-20 DIAGNOSIS — N2581 Secondary hyperparathyroidism of renal origin: Secondary | ICD-10-CM | POA: Diagnosis not present

## 2019-06-20 DIAGNOSIS — D500 Iron deficiency anemia secondary to blood loss (chronic): Secondary | ICD-10-CM | POA: Diagnosis not present

## 2019-06-20 DIAGNOSIS — N186 End stage renal disease: Secondary | ICD-10-CM | POA: Diagnosis not present

## 2019-06-20 DIAGNOSIS — D631 Anemia in chronic kidney disease: Secondary | ICD-10-CM | POA: Diagnosis not present

## 2019-06-20 DIAGNOSIS — Z992 Dependence on renal dialysis: Secondary | ICD-10-CM | POA: Diagnosis not present

## 2019-06-21 DIAGNOSIS — Z992 Dependence on renal dialysis: Secondary | ICD-10-CM | POA: Diagnosis not present

## 2019-06-21 DIAGNOSIS — N186 End stage renal disease: Secondary | ICD-10-CM | POA: Diagnosis not present

## 2019-06-23 DIAGNOSIS — N2581 Secondary hyperparathyroidism of renal origin: Secondary | ICD-10-CM | POA: Diagnosis not present

## 2019-06-23 DIAGNOSIS — D500 Iron deficiency anemia secondary to blood loss (chronic): Secondary | ICD-10-CM | POA: Diagnosis not present

## 2019-06-23 DIAGNOSIS — D631 Anemia in chronic kidney disease: Secondary | ICD-10-CM | POA: Diagnosis not present

## 2019-06-23 DIAGNOSIS — E8809 Other disorders of plasma-protein metabolism, not elsewhere classified: Secondary | ICD-10-CM | POA: Diagnosis not present

## 2019-06-23 DIAGNOSIS — N186 End stage renal disease: Secondary | ICD-10-CM | POA: Diagnosis not present

## 2019-06-23 DIAGNOSIS — Z992 Dependence on renal dialysis: Secondary | ICD-10-CM | POA: Diagnosis not present

## 2019-06-25 DIAGNOSIS — Z992 Dependence on renal dialysis: Secondary | ICD-10-CM | POA: Diagnosis not present

## 2019-06-25 DIAGNOSIS — D500 Iron deficiency anemia secondary to blood loss (chronic): Secondary | ICD-10-CM | POA: Diagnosis not present

## 2019-06-25 DIAGNOSIS — D631 Anemia in chronic kidney disease: Secondary | ICD-10-CM | POA: Diagnosis not present

## 2019-06-25 DIAGNOSIS — N2581 Secondary hyperparathyroidism of renal origin: Secondary | ICD-10-CM | POA: Diagnosis not present

## 2019-06-25 DIAGNOSIS — N186 End stage renal disease: Secondary | ICD-10-CM | POA: Diagnosis not present

## 2019-06-27 DIAGNOSIS — D631 Anemia in chronic kidney disease: Secondary | ICD-10-CM | POA: Diagnosis not present

## 2019-06-27 DIAGNOSIS — D500 Iron deficiency anemia secondary to blood loss (chronic): Secondary | ICD-10-CM | POA: Diagnosis not present

## 2019-06-27 DIAGNOSIS — N2581 Secondary hyperparathyroidism of renal origin: Secondary | ICD-10-CM | POA: Diagnosis not present

## 2019-06-27 DIAGNOSIS — N186 End stage renal disease: Secondary | ICD-10-CM | POA: Diagnosis not present

## 2019-06-27 DIAGNOSIS — Z992 Dependence on renal dialysis: Secondary | ICD-10-CM | POA: Diagnosis not present

## 2019-06-30 DIAGNOSIS — D500 Iron deficiency anemia secondary to blood loss (chronic): Secondary | ICD-10-CM | POA: Diagnosis not present

## 2019-06-30 DIAGNOSIS — N186 End stage renal disease: Secondary | ICD-10-CM | POA: Diagnosis not present

## 2019-06-30 DIAGNOSIS — Z992 Dependence on renal dialysis: Secondary | ICD-10-CM | POA: Diagnosis not present

## 2019-06-30 DIAGNOSIS — N2581 Secondary hyperparathyroidism of renal origin: Secondary | ICD-10-CM | POA: Diagnosis not present

## 2019-06-30 DIAGNOSIS — D631 Anemia in chronic kidney disease: Secondary | ICD-10-CM | POA: Diagnosis not present

## 2019-07-02 DIAGNOSIS — D500 Iron deficiency anemia secondary to blood loss (chronic): Secondary | ICD-10-CM | POA: Diagnosis not present

## 2019-07-02 DIAGNOSIS — Z992 Dependence on renal dialysis: Secondary | ICD-10-CM | POA: Diagnosis not present

## 2019-07-02 DIAGNOSIS — N186 End stage renal disease: Secondary | ICD-10-CM | POA: Diagnosis not present

## 2019-07-02 DIAGNOSIS — D631 Anemia in chronic kidney disease: Secondary | ICD-10-CM | POA: Diagnosis not present

## 2019-07-02 DIAGNOSIS — N2581 Secondary hyperparathyroidism of renal origin: Secondary | ICD-10-CM | POA: Diagnosis not present

## 2019-07-04 DIAGNOSIS — D631 Anemia in chronic kidney disease: Secondary | ICD-10-CM | POA: Diagnosis not present

## 2019-07-04 DIAGNOSIS — N2581 Secondary hyperparathyroidism of renal origin: Secondary | ICD-10-CM | POA: Diagnosis not present

## 2019-07-04 DIAGNOSIS — N186 End stage renal disease: Secondary | ICD-10-CM | POA: Diagnosis not present

## 2019-07-04 DIAGNOSIS — D500 Iron deficiency anemia secondary to blood loss (chronic): Secondary | ICD-10-CM | POA: Diagnosis not present

## 2019-07-04 DIAGNOSIS — Z992 Dependence on renal dialysis: Secondary | ICD-10-CM | POA: Diagnosis not present

## 2019-07-07 DIAGNOSIS — N2581 Secondary hyperparathyroidism of renal origin: Secondary | ICD-10-CM | POA: Diagnosis not present

## 2019-07-07 DIAGNOSIS — N186 End stage renal disease: Secondary | ICD-10-CM | POA: Diagnosis not present

## 2019-07-07 DIAGNOSIS — Z992 Dependence on renal dialysis: Secondary | ICD-10-CM | POA: Diagnosis not present

## 2019-07-07 DIAGNOSIS — D500 Iron deficiency anemia secondary to blood loss (chronic): Secondary | ICD-10-CM | POA: Diagnosis not present

## 2019-07-07 DIAGNOSIS — D631 Anemia in chronic kidney disease: Secondary | ICD-10-CM | POA: Diagnosis not present

## 2019-07-09 DIAGNOSIS — D631 Anemia in chronic kidney disease: Secondary | ICD-10-CM | POA: Diagnosis not present

## 2019-07-09 DIAGNOSIS — N186 End stage renal disease: Secondary | ICD-10-CM | POA: Diagnosis not present

## 2019-07-09 DIAGNOSIS — Z992 Dependence on renal dialysis: Secondary | ICD-10-CM | POA: Diagnosis not present

## 2019-07-09 DIAGNOSIS — D500 Iron deficiency anemia secondary to blood loss (chronic): Secondary | ICD-10-CM | POA: Diagnosis not present

## 2019-07-09 DIAGNOSIS — N2581 Secondary hyperparathyroidism of renal origin: Secondary | ICD-10-CM | POA: Diagnosis not present

## 2019-07-11 DIAGNOSIS — D631 Anemia in chronic kidney disease: Secondary | ICD-10-CM | POA: Diagnosis not present

## 2019-07-11 DIAGNOSIS — N186 End stage renal disease: Secondary | ICD-10-CM | POA: Diagnosis not present

## 2019-07-11 DIAGNOSIS — D500 Iron deficiency anemia secondary to blood loss (chronic): Secondary | ICD-10-CM | POA: Diagnosis not present

## 2019-07-11 DIAGNOSIS — N2581 Secondary hyperparathyroidism of renal origin: Secondary | ICD-10-CM | POA: Diagnosis not present

## 2019-07-11 DIAGNOSIS — Z992 Dependence on renal dialysis: Secondary | ICD-10-CM | POA: Diagnosis not present

## 2019-07-13 DIAGNOSIS — N186 End stage renal disease: Secondary | ICD-10-CM | POA: Diagnosis not present

## 2019-07-13 DIAGNOSIS — D631 Anemia in chronic kidney disease: Secondary | ICD-10-CM | POA: Diagnosis not present

## 2019-07-13 DIAGNOSIS — D500 Iron deficiency anemia secondary to blood loss (chronic): Secondary | ICD-10-CM | POA: Diagnosis not present

## 2019-07-13 DIAGNOSIS — Z992 Dependence on renal dialysis: Secondary | ICD-10-CM | POA: Diagnosis not present

## 2019-07-13 DIAGNOSIS — N2581 Secondary hyperparathyroidism of renal origin: Secondary | ICD-10-CM | POA: Diagnosis not present

## 2019-07-14 ENCOUNTER — Other Ambulatory Visit: Payer: Self-pay | Admitting: Family Medicine

## 2019-07-15 ENCOUNTER — Ambulatory Visit: Payer: Medicare Other

## 2019-07-15 DIAGNOSIS — Z992 Dependence on renal dialysis: Secondary | ICD-10-CM | POA: Diagnosis not present

## 2019-07-15 DIAGNOSIS — N186 End stage renal disease: Secondary | ICD-10-CM | POA: Diagnosis not present

## 2019-07-15 DIAGNOSIS — N2581 Secondary hyperparathyroidism of renal origin: Secondary | ICD-10-CM | POA: Diagnosis not present

## 2019-07-15 DIAGNOSIS — D500 Iron deficiency anemia secondary to blood loss (chronic): Secondary | ICD-10-CM | POA: Diagnosis not present

## 2019-07-15 DIAGNOSIS — D631 Anemia in chronic kidney disease: Secondary | ICD-10-CM | POA: Diagnosis not present

## 2019-07-18 DIAGNOSIS — N2581 Secondary hyperparathyroidism of renal origin: Secondary | ICD-10-CM | POA: Diagnosis not present

## 2019-07-18 DIAGNOSIS — D500 Iron deficiency anemia secondary to blood loss (chronic): Secondary | ICD-10-CM | POA: Diagnosis not present

## 2019-07-18 DIAGNOSIS — D631 Anemia in chronic kidney disease: Secondary | ICD-10-CM | POA: Diagnosis not present

## 2019-07-18 DIAGNOSIS — Z992 Dependence on renal dialysis: Secondary | ICD-10-CM | POA: Diagnosis not present

## 2019-07-18 DIAGNOSIS — N186 End stage renal disease: Secondary | ICD-10-CM | POA: Diagnosis not present

## 2019-07-20 DIAGNOSIS — Z20828 Contact with and (suspected) exposure to other viral communicable diseases: Secondary | ICD-10-CM | POA: Diagnosis not present

## 2019-07-20 DIAGNOSIS — J019 Acute sinusitis, unspecified: Secondary | ICD-10-CM | POA: Diagnosis not present

## 2019-07-21 DIAGNOSIS — D631 Anemia in chronic kidney disease: Secondary | ICD-10-CM | POA: Diagnosis not present

## 2019-07-21 DIAGNOSIS — D500 Iron deficiency anemia secondary to blood loss (chronic): Secondary | ICD-10-CM | POA: Diagnosis not present

## 2019-07-21 DIAGNOSIS — N186 End stage renal disease: Secondary | ICD-10-CM | POA: Diagnosis not present

## 2019-07-21 DIAGNOSIS — N2581 Secondary hyperparathyroidism of renal origin: Secondary | ICD-10-CM | POA: Diagnosis not present

## 2019-07-21 DIAGNOSIS — Z992 Dependence on renal dialysis: Secondary | ICD-10-CM | POA: Diagnosis not present

## 2019-07-24 ENCOUNTER — Telehealth: Payer: Self-pay

## 2019-07-24 ENCOUNTER — Ambulatory Visit: Payer: Medicare Other

## 2019-07-30 DIAGNOSIS — E1129 Type 2 diabetes mellitus with other diabetic kidney complication: Secondary | ICD-10-CM | POA: Diagnosis not present

## 2019-07-31 NOTE — Telephone Encounter (Signed)
Left message for patient to call back to reschedule AWV.

## 2019-08-06 ENCOUNTER — Ambulatory Visit (INDEPENDENT_AMBULATORY_CARE_PROVIDER_SITE_OTHER): Payer: Medicare Other

## 2019-08-06 VITALS — Ht 68.0 in | Wt 286.2 lb

## 2019-08-06 DIAGNOSIS — Z0000 Encounter for general adult medical examination without abnormal findings: Secondary | ICD-10-CM | POA: Diagnosis not present

## 2019-08-06 NOTE — Progress Notes (Signed)
This visit is being conducted via phone call due to the COVID-19 pandemic. This patient has given me verbal consent via phone to conduct this visit, patient states they are participating from their home address. Some vital signs may be absent or patient reported.   Patient identification: identified by name, DOB, and current address.  Location provider: [facility], Office Persons participating in the virtual visit: [name] and [name], LPN.    Subjective:   Kent Osborne is a 68 y.o. male who presents for an Initial Medicare Annual Wellness Visit.  [name] goes to dialysis 3 x weekly. He goes out for most of his meals or his daughter brings him food. He has chronic pain issues in his back, knees, and neuropathy in feet and this makes it difficult for him to exercise. He states he wears a full face mask with CPAP + oxygen at bedtime (2.5LPM). [name] reports blood sugar this morning was 129.  Review of Systems   Cardiac Risk Factors include: obesity (BMI >30kg/m2);male gender;hypertension;advanced age (>73men, >13 women);diabetes mellitus;dyslipidemia;sedentary lifestyle    Objective:    Today's Vitals   08/06/19 1415  Weight: 286 lb 2.5 oz (129.8 kg)  Height: 5\' 8"  (1.727 m)   Body mass index is 43.51 kg/m.  Advanced Directives 08/06/2019 04/23/2018 04/18/2018 03/04/2018 11/01/2016 11/01/2016 10/30/2016  Does Patient Have a Medical Advance Directive? No No No No No No No  Would patient like information on creating a medical advance directive? No - Patient declined No - Patient declined No - Patient declined Yes (Inpatient - patient defers creating a medical advance directive at this time) No - Patient declined - No - Patient declined    Current Medications (verified) Outpatient Encounter Medications as of 08/06/2019  Medication Sig  . amitriptyline (ELAVIL) 25 MG tablet Take 25 mg by mouth at bedtime.   Marland Kitchen aspirin EC 81 MG tablet Take 81 mg by  mouth daily.  [name] 1 GM 210 MG(Fe) tablet   . carvedilol (COREG) 6.25 MG tablet TAKE 1 TABLET BY MOUTH TWICE DAILY WITH  A  MEAL  . Cholecalciferol (VITAMIN D) 2000 units tablet Take 2,000 Units by mouth daily.  . colchicine 0.6 MG tablet 2 tabs as soon as gout symptoms start.  . hydrALAZINE (APRESOLINE) 50 MG tablet Take 1 tablet (50 mg total) by mouth 3 (three) times daily.  . isosorbide mononitrate (IMDUR) 60 MG 24 hr tablet Take 1 tablet by mouth once daily  . lidocaine-prilocaine (EMLA) cream APPLY SMALL AMOUNT TO ACCESS SITE (AVF) 1 TO 2 HOURS BEFORE DIALYSIS. COVER WITH OCCLUSIVE DRESSING (SARAN WRAP)  . montelukast (SINGULAIR) 10 MG tablet Take 10 mg by mouth at bedtime.   . nitroGLYCERIN (NITROSTAT) 0.4 MG SL tablet Place 1 tablet (0.4 mg total) under the tongue every 5 (five) minutes as needed for chest pain.  Marland Kitchen NOVOLOG FLEXPEN 100 UNIT/ML FlexPen INJECT 2 TO 25 UNITS SUBCUTANEOUSLY 3 TIMES A DAY WITH MEALS PER SLIDING SCALE  . Oxycodone HCl 10 MG TABS Take 10 mg by mouth 4 (four) times daily as needed (for pain).   . simvastatin (ZOCOR) 40 MG tablet Take 1 tablet (40 mg total) by mouth at bedtime.  . tamsulosin (FLOMAX) 0.4 MG CAPS capsule Take 1 capsule (0.4 mg total) by mouth daily.  . TRESIBA FLEXTOUCH 100 UNIT/ML SOPN FlexTouch Pen INJECT 50 UNITS SUB-Q ONCE DAILY  . calcium acetate (PHOSLO) 667 MG capsule Take 1 capsule (667 mg total) by mouth  3 (three) times daily with meals. (Patient not taking: Reported on 08/06/2019)  . doxycycline (VIBRAMYCIN) 100 MG capsule Take 100 mg by mouth 2 (two) times daily.  Marland Kitchen glucose blood test strip Use to test glucose 3 times daily. Dx: E 11.9  . torsemide (DEMADEX) 20 MG tablet Take 1 tablet (20 mg total) by mouth daily. Please discuss with your nephrologist Dr Marlowe Sax regarding further dosing adjustment of this meds. Please discuss with Dr Marlowe Sax regarding timing of starting on dialysis. (Patient not taking: Reported on 08/06/2019)   No  facility-administered encounter medications on file as of 08/06/2019.    Allergies (verified) Patient has no known allergies.   History: Past Medical History:  Diagnosis Date  . Acute renal failure superimposed on chronic kidney disease ([place])    Kent Osborne 11/01/2016  . Anemia   . Arthritis    "back, knees" (11/01/2016)  . Asthma   . CAD (coronary artery disease) 12/31/07   Kent Osborne stents- placed in the circumflex and LAD  . Chronic bronchitis ([place])   . Hemodialysis status (Kent Osborne)   . History of blood transfusion 10/30/2016   "related to anemia"  . History of gout   . History of kidney stones   . Hyperlipidemia   . Hypertension   . NSTEMI (non-ST elevated myocardial infarction) (Kent Osborne) 10/30/2016  . OA (osteoarthritis)   . Obese   . On home oxygen therapy    "2L w/CPAP" (11/01/2016)  . OSA on CPAP   . Psoriatic arthritis (Kent Osborne)   . Type II diabetes mellitus (Kent Osborne)    Past Surgical History:  Procedure Laterality Date  . AV FISTULA PLACEMENT    . CORONARY ANGIOPLASTY WITH STENT PLACEMENT  2009   "3 stents"  . CYSTOSCOPY WITH URETEROSCOPY AND STENT PLACEMENT Bilateral 04/23/2018   Procedure: CYSTOSCOPY WITH BLADDER  BIOPSY BILATERAL RETROGRADE PYELOGRAM LEFT URETEROSCOPY WITH BIOPSY AND LEFT URETERAL  STENT PLACEMENT;  Surgeon: Festus Aloe, MD;  Location: [facility];  Service: Urology;  Laterality: Bilateral;   Family History  Problem Relation Age of Onset  . Diabetes Other   . Hyperlipidemia Other   . Hypertension Other    Social History   Socioeconomic History  . Marital status: Divorced    Spouse name: Not on file  . Number of children: 3  . Years of education: 26  . Highest education level: High school graduate  Occupational History  . Not on file  Tobacco Use  . Smoking status: Never Smoker  . Smokeless tobacco: Never Used  Substance and Sexual Activity  . Alcohol use: Not Currently    Comment: 11/01/2016 "nothing in years"  . Drug use: No  . Sexual  activity: Yes  Other Topics Concern  . Not on file  Social History Narrative   Divorced   1 son and 2 daughters   2 grandsons   Retired Conservation officer, nature   Social Determinants of Radio broadcast assistant Strain: [place]   . Difficulty of Paying Living Expenses: Not very hard  Food Insecurity: No Food Insecurity  . Worried About Charity fundraiser in the Last Year: Never true  . Ran Out of Food in the Last Year: Never true  Transportation Needs:   . Lack of Transportation (Medical): Not on file  . Lack of Transportation (Non-Medical): Not on file  Physical Activity: Inactive  . Days of Exercise per Week: 0 days  . Minutes of Exercise per Session: 0 min  Stress:   . Feeling of  Stress : Not on file  Social Connections: Unknown  . Frequency of Communication with Friends and Family: More than three times a week  . Frequency of Social Gatherings with Friends and Family: More than three times a week  . Attends Religious Services: Not on file  . Active Member of Clubs or Organizations: Not on file  . Attends Archivist Meetings: Not on file  . Marital Status: Divorced   Tobacco Counseling Counseling given: Not Answered   Clinical Intake:  Pre-visit preparation completed: Yes  Pain : 0-10 Pain Type: Neuropathic pain, Chronic pain Pain Location: Foot Pain Descriptors / Indicators: Burning, Tingling     Nutritional Status: BMI > 30  Obese Diabetes: Yes CBG done?: (145 today) Did pt. bring in CBG monitor from home?: (N/A)  What is the last grade level you completed in school?: 12  Interpreter Needed?: No  Information entered by :: [name], LPN.  Activities of Daily Living In your present state of health, do you have any difficulty performing the following activities: 08/06/2019  Hearing? N  Vision? N  Difficulty concentrating or making decisions? N  Walking or climbing stairs? N  Dressing or bathing? N  Doing errands, shopping? N  Preparing  Food and eating ? N  Using the Toilet? N  In the past six months, have you accidently leaked urine? N  Do you have problems with loss of bowel control? N  Managing your Medications? N  Managing your Finances? N  Housekeeping or managing your Housekeeping? N  Some recent data might be hidden     Immunizations and Health Maintenance Immunization History  Administered Date(s) Administered  . H1N1 07/28/2008  . Influenza Split 05/29/2011  . Influenza Whole 08/21/2002, 05/26/2009, 05/05/2010  . Influenza,inj,Quad PF,6+ Mos 06/17/2013, 04/15/2014, 04/27/2016  . Pneumococcal Conjugate-13 04/15/2014  . Pneumococcal Polysaccharide-23 08/21/2002, 06/17/2013  . Td 05/21/1998, 05/05/2010   Health Maintenance Due  Topic Date Due  . PNA vac Low Risk Adult (2 of 2 - PPSV23) 06/17/2018  . FOOT EXAM  08/10/2018  . HEMOGLOBIN A1C  10/19/2018  . OPHTHALMOLOGY EXAM  11/20/2018  . INFLUENZA VACCINE  03/22/2019    Patient Care Team: Martinique, Betty G, MD as PCP - General (Family Medicine) Oneta Rack Alphonse Guild, DPM as Referring Physician (Podiatry)  Indicate any recent Medical Services you may have received from other than [facility] providers in the past year (date may be approximate).    Assessment:   This is a routine wellness examination for Lory.  Hearing/Vision screen  Hearing Screening   125Hz  250Hz  500Hz  1000Hz  2000Hz  3000Hz  4000Hz  6000Hz  8000Hz   Right ear:           Left ear:           Comments: No problems hearing   Vision Screening Comments: Hx cataract surgery; sees eye provider every 6 months; wears only reading glasses  Dietary issues and exercise activities discussed: Current Exercise Habits: The patient does not participate in regular exercise at present, Exercise limited by: orthopedic condition(s)  Goals    . Cut out extra servings    . Exercise 3x per week      Depression Screen PHQ 2/9 Scores 08/06/2019  PHQ - 2 Score 0    Fall Risk Fall Risk  08/06/2019  Falls in  the past year? 1  Number falls in past yr: 0  Injury with Fall? 0  Risk for fall due to : History of fall(s);Medication side effect  Follow up Falls  evaluation completed;Education provided;Falls prevention discussed    Is the patient's home free of loose throw rugs in walkways, pet beds, electrical cords, etc?   yes      Grab bars in the bathroom? yes      Handrails on the stairs?   yes      Adequate lighting?   yes  Timed Get Up and Go performed: N/A due to telephone visit.  Cognitive Function:     6CIT Screen 08/06/2019  What Year? 0 points  What month? 0 points  What time? 0 points  Count back from 20 0 points  Months in reverse 0 points  Repeat phrase 0 points  Total Score 0    Screening Tests Health Maintenance  Topic Date Due  . PNA vac Low Risk Adult (2 of 2 - PPSV23) 06/17/2018  . FOOT EXAM  08/10/2018  . HEMOGLOBIN A1C  10/19/2018  . OPHTHALMOLOGY EXAM  11/20/2018  . INFLUENZA VACCINE  03/22/2019  . TETANUS/TDAP  05/05/2020  . COLONOSCOPY  10/11/2020  . Hepatitis C Screening  Discontinued    Qualifies for Shingles Vaccine? Yes, he will try to obtain at dialysis center or at his pharmacy.  Cancer Screenings: Lung: Low Dose CT Chest recommended if Age 42-80 years, 30 pack-year currently smoking OR have quit w/in 15years. Patient does not qualify. Colorectal: patient declines.   Additional Screenings:  Hepatitis C Screening: none found. Can discuss at next office appointment.       Plan:   Mr. Leffel is doing quite well at this time. He plans to obtain updated Tdap and Shingrix at dialysis center or his pharmacy. He declines colon cancer screening at this time.   I have personally reviewed and noted the following in the patient's chart:   . Medical and social history . Use of alcohol, tobacco or illicit drugs  . Current medications and supplements . Functional ability and status . Nutritional status . Physical activity . Advanced  directives . List of other physicians . Hospitalizations, surgeries, and ER visits in previous 12 months . Vitals . Screenings to include cognitive, depression, and falls . Referrals and appointments  In addition, I have reviewed and discussed with patient certain preventive protocols, quality metrics, and best practice recommendations. A written personalized care plan for preventive services as well as general preventive health recommendations were provided to patient.     [name], LPN   [id number]

## 2019-08-06 NOTE — Patient Instructions (Addendum)
[name] , Thank you for taking time to participate in your Medicare Wellness Visit. I appreciate your ongoing commitment to your health goals. Please review the following plan we discussed and let me know if I can assist you in the future.   Screening recommendations/referrals: Colorectal Screening: patient declines  Vision and Dental Exams: Recommended annual ophthalmology exams for early detection of glaucoma and other disorders of the eye Recommended annual dental exams for proper oral hygiene  Diabetic Exams: Diabetic Eye Exam: Sees ophthalmologist 2 x year Diabetic Foot Exam: has appointment with podiatrist on 08/12/2019  Vaccinations: Influenza vaccine: patient reports he received in Sept 2020 at dialysis center. Pneumococcal vaccine: Completed 04/15/2014 and 06/17/2013.  Tdap vaccine: completed 05/05/2010; due again soon. He will ask if dialysis center can give to him. Shingles vaccine: Discussed shingrix today. He will try to get this at the dialysis center or will have done at his pharmacy.  Advanced directives: Advance directives discussed with you today. Advance directives discussed with you today. You have declined to receive documents for completion.  Goals: Recommend to exercise at least 3 times per week, as you are able. Recommend to remove any items from the home that may cause slips or trips. Recommend to decrease portion sizes by eating 3 small healthy meals and at least 2 healthy snacks per day.  Next appointment: Please schedule your Annual Wellness Visit with your Nurse Health Advisor in one year.  Preventive Care 32 Years and Older, Male Preventive care refers to lifestyle choices and visits with your health care provider that can promote health and wellness. What does preventive care include?  A yearly physical exam. This is also called an annual well check.  Dental exams once or twice a year.  Routine eye exams. Ask your health care provider how often  you should have your eyes checked.  Personal lifestyle choices, including:  Daily care of your teeth and gums.  Regular physical activity.  Eating a healthy diet.  Avoiding tobacco and drug use.  Limiting alcohol use.  Practicing safe sex.  Taking low doses of aspirin every day if recommended by your health care provider..  Taking vitamin and mineral supplements as recommended by your health care provider. What happens during an annual well check? The services and screenings done by your health care provider during your annual well check will depend on your age, overall health, lifestyle risk factors, and family history of disease. Counseling  Your health care provider may ask you questions about your:  Alcohol use.  Tobacco use.  Drug use.  Emotional well-being.  Home and relationship well-being.  Sexual activity.  Eating habits.  History of falls.  Memory and ability to understand (cognition).  Work and work Statistician. Screening  You may have the following tests or measurements:  Height, weight, and BMI.  Blood pressure.  Lipid and cholesterol levels. These may be checked every 5 years, or more frequently if you are over 71 years old.  Skin check.  Lung cancer screening. You may have this screening every year starting at age 46 if you have a 30-pack-year history of smoking and currently smoke or have quit within the past 15 years.  Fecal occult blood test (FOBT) of the stool. You may have this test every year starting at age 58.  Flexible sigmoidoscopy or colonoscopy. You may have a sigmoidoscopy every 5 years or a colonoscopy every 10 years starting at age 52.  Prostate cancer screening. Recommendations will vary depending on your  family history and other risks.  Hepatitis C blood test.  Hepatitis B blood test.  Sexually transmitted disease (STD) testing.  Diabetes screening. This is done by checking your blood sugar (glucose) after you have  not eaten for a while (fasting). You may have this done every 1-3 years.  Abdominal aortic aneurysm (AAA) screening. You may need this if you are a current or former smoker.  Osteoporosis. You may be screened starting at age 55 if you are at high risk. Talk with your health care provider about your test results, treatment options, and if necessary, the need for more tests. Vaccines  Your health care provider may recommend certain vaccines, such as:  Influenza vaccine. This is recommended every year.  Tetanus, diphtheria, and acellular pertussis (Tdap, Td) vaccine. You may need a Td booster every 10 years.  Zoster vaccine. You may need this after age 57.  Pneumococcal 13-valent conjugate (PCV13) vaccine. One dose is recommended after age 45.  Pneumococcal polysaccharide (PPSV23) vaccine. One dose is recommended after age 46. Talk to your health care provider about which screenings and vaccines you need and how often you need them. This information is not intended to replace advice given to you by your health care provider. Make sure you discuss any questions you have with your health care provider. Document Released: 09/03/2015 Document Revised: 04/26/2016 Document Reviewed: 06/08/2015 Elsevier Interactive Patient Education  2017 [place] Prevention in the Home Falls can cause injuries. They can happen to people of all ages. There are many things you can do to make your home safe and to help prevent falls. What can I do on the outside of my home?  Regularly fix the edges of walkways and driveways and fix any cracks.  Remove anything that might make you trip as you walk through a door, such as a raised step or threshold.  Trim any bushes or trees on the path to your home.  Use bright outdoor lighting.  Clear any walking paths of anything that might make someone trip, such as rocks or tools.  Regularly check to see if handrails are loose or broken. Make sure that both  sides of any steps have handrails.  Any raised decks and porches should have guardrails on the edges.  Have any leaves, snow, or ice cleared regularly.  Use sand or salt on walking paths during winter.  Clean up any spills in your garage right away. This includes oil or grease spills. What can I do in the bathroom?  Use night lights.  Install grab bars by the toilet and in the tub and shower. Do not use towel bars as grab bars.  Use non-skid mats or decals in the tub or shower.  If you need to sit down in the shower, use a plastic, non-slip stool.  Keep the floor dry. Clean up any water that spills on the floor as soon as it happens.  Remove soap buildup in the tub or shower regularly.  Attach bath mats securely with double-sided non-slip rug tape.  Do not have throw rugs and other things on the floor that can make you trip. What can I do in the bedroom?  Use night lights.  Make sure that you have a light by your bed that is easy to reach.  Do not use any sheets or blankets that are too big for your bed. They should not hang down onto the floor.  Have a firm chair that has side arms. You can  use this for support while you get dressed.  Do not have throw rugs and other things on the floor that can make you trip. What can I do in the kitchen?  Clean up any spills right away.  Avoid walking on wet floors.  Keep items that you use a lot in easy-to-reach places.  If you need to reach something above you, use a strong step stool that has a grab bar.  Keep electrical cords out of the way.  Do not use floor polish or wax that makes floors slippery. If you must use wax, use non-skid floor wax.  Do not have throw rugs and other things on the floor that can make you trip. What can I do with my stairs?  Do not leave any items on the stairs.  Make sure that there are handrails on both sides of the stairs and use them. Fix handrails that are broken or loose. Make sure that  handrails are as long as the stairways.  Check any carpeting to make sure that it is firmly attached to the stairs. Fix any carpet that is loose or worn.  Avoid having throw rugs at the top or bottom of the stairs. If you do have throw rugs, attach them to the floor with carpet tape.  Make sure that you have a light switch at the top of the stairs and the bottom of the stairs. If you do not have them, ask someone to add them for you. What else can I do to help prevent falls?  Wear shoes that:  Do not have high heels.  Have rubber bottoms.  Are comfortable and fit you well.  Are closed at the toe. Do not wear sandals.  If you use a stepladder:  Make sure that it is fully opened. Do not climb a closed stepladder.  Make sure that both sides of the stepladder are locked into place.  Ask someone to hold it for you, if possible.  Clearly mark and make sure that you can see:  Any grab bars or handrails.  First and last steps.  Where the edge of each step is.  Use tools that help you move around (mobility aids) if they are needed. These include:  Canes.  Walkers.  Scooters.  Crutches.  Turn on the lights when you go into a dark area. Replace any light bulbs as soon as they burn out.  Set up your furniture so you have a clear path. Avoid moving your furniture around.  If any of your floors are uneven, fix them.  If there are any pets around you, be aware of where they are.  Review your medicines with your doctor. Some medicines can make you feel dizzy. This can increase your chance of falling. Ask your doctor what other things that you can do to help prevent falls. This information is not intended to replace advice given to you by your health care provider. Make sure you discuss any questions you have with your health care provider. Document Released: 06/03/2009 Document Revised: 01/13/2016 Document Reviewed: 09/11/2014 Elsevier Interactive Patient Education  2017  [employer].

## 2019-08-20 ENCOUNTER — Other Ambulatory Visit: Payer: Self-pay | Admitting: Family Medicine

## 2019-08-20 DIAGNOSIS — I10 Essential (primary) hypertension: Secondary | ICD-10-CM

## 2019-08-20 DIAGNOSIS — E785 Hyperlipidemia, unspecified: Secondary | ICD-10-CM

## 2019-08-21 MED ORDER — TRESIBA FLEXTOUCH 100 UNIT/ML ~~LOC~~ SOPN: PEN_INJECTOR | SUBCUTANEOUS | 0 refills | Status: DC

## 2019-08-21 NOTE — Telephone Encounter (Signed)
Pt called to see if if medication refills requested from the pharmacy can be sent in today asap and he also needed a refill for his TRESIBA FLEXTOUCH 100 UNIT/ML SOPN FlexTouch Pen  / please advise today if they can be sent.

## 2019-08-23 DIAGNOSIS — N186 End stage renal disease: Secondary | ICD-10-CM | POA: Diagnosis not present

## 2019-08-23 DIAGNOSIS — Z992 Dependence on renal dialysis: Secondary | ICD-10-CM | POA: Diagnosis not present

## 2019-08-23 DIAGNOSIS — D631 Anemia in chronic kidney disease: Secondary | ICD-10-CM | POA: Diagnosis not present

## 2019-08-23 DIAGNOSIS — D500 Iron deficiency anemia secondary to blood loss (chronic): Secondary | ICD-10-CM | POA: Diagnosis not present

## 2019-08-23 DIAGNOSIS — E8809 Other disorders of plasma-protein metabolism, not elsewhere classified: Secondary | ICD-10-CM | POA: Diagnosis not present

## 2019-08-23 DIAGNOSIS — N2581 Secondary hyperparathyroidism of renal origin: Secondary | ICD-10-CM | POA: Diagnosis not present

## 2019-08-25 ENCOUNTER — Other Ambulatory Visit: Payer: Self-pay

## 2019-08-25 DIAGNOSIS — N186 End stage renal disease: Secondary | ICD-10-CM | POA: Diagnosis not present

## 2019-08-25 DIAGNOSIS — Z992 Dependence on renal dialysis: Secondary | ICD-10-CM | POA: Diagnosis not present

## 2019-08-25 DIAGNOSIS — D500 Iron deficiency anemia secondary to blood loss (chronic): Secondary | ICD-10-CM | POA: Diagnosis not present

## 2019-08-25 DIAGNOSIS — N2581 Secondary hyperparathyroidism of renal origin: Secondary | ICD-10-CM | POA: Diagnosis not present

## 2019-08-25 DIAGNOSIS — D631 Anemia in chronic kidney disease: Secondary | ICD-10-CM | POA: Diagnosis not present

## 2019-08-26 ENCOUNTER — Ambulatory Visit (INDEPENDENT_AMBULATORY_CARE_PROVIDER_SITE_OTHER): Payer: Medicare Other | Admitting: Family Medicine

## 2019-08-26 ENCOUNTER — Encounter: Payer: Self-pay | Admitting: Family Medicine

## 2019-08-26 VITALS — BP 130/80 | HR 100 | Temp 96.2°F | Resp 16 | Ht 68.0 in | Wt 290.0 lb

## 2019-08-26 DIAGNOSIS — Z992 Dependence on renal dialysis: Secondary | ICD-10-CM | POA: Diagnosis not present

## 2019-08-26 DIAGNOSIS — N186 End stage renal disease: Secondary | ICD-10-CM | POA: Diagnosis not present

## 2019-08-26 DIAGNOSIS — J449 Chronic obstructive pulmonary disease, unspecified: Secondary | ICD-10-CM | POA: Diagnosis not present

## 2019-08-26 DIAGNOSIS — E1165 Type 2 diabetes mellitus with hyperglycemia: Secondary | ICD-10-CM | POA: Diagnosis not present

## 2019-08-26 DIAGNOSIS — E1122 Type 2 diabetes mellitus with diabetic chronic kidney disease: Secondary | ICD-10-CM | POA: Diagnosis not present

## 2019-08-26 DIAGNOSIS — E1142 Type 2 diabetes mellitus with diabetic polyneuropathy: Secondary | ICD-10-CM

## 2019-08-26 DIAGNOSIS — I509 Heart failure, unspecified: Secondary | ICD-10-CM | POA: Diagnosis not present

## 2019-08-26 DIAGNOSIS — E1151 Type 2 diabetes mellitus with diabetic peripheral angiopathy without gangrene: Secondary | ICD-10-CM

## 2019-08-26 DIAGNOSIS — IMO0002 Reserved for concepts with insufficient information to code with codable children: Secondary | ICD-10-CM

## 2019-08-26 DIAGNOSIS — Z6841 Body Mass Index (BMI) 40.0 and over, adult: Secondary | ICD-10-CM

## 2019-08-26 LAB — POCT GLYCOSYLATED HEMOGLOBIN (HGB A1C): Hemoglobin A1C: 7.2 % — AB (ref 4.0–5.6)

## 2019-08-26 MED ORDER — TRESIBA FLEXTOUCH 100 UNIT/ML ~~LOC~~ SOPN: PEN_INJECTOR | SUBCUTANEOUS | 3 refills | Status: DC

## 2019-08-26 MED ORDER — NOVOLOG FLEXPEN 100 UNIT/ML ~~LOC~~ SOPN: PEN_INJECTOR | SUBCUTANEOUS | 3 refills | Status: AC

## 2019-08-26 NOTE — Progress Notes (Signed)
*     HPI:   [name] is a 69 y.o. male, who is here today for chronic disease management.  He was last seen on 11/26/18. ESRD, he is having dialysis 3 times per week and feels much better since then. He has been on dialysis for over a year. Kidney failure due to DM II.  He is still producing some urine but small amount. Following with nephrologist regularly and also following with kidney transplant team. Anemia of chronic disease and iron deficiency and secondary hyperparathyroidism.  He is considering not to pursue renal transplant due to his age and possible complications from procedure and medications.  He is not longer having gross hematuria,following with urologist. S/P cystoscopy and left ureteral stent placement on 04/23/2018. DM II: Dx'ed 30+ years ago. Novolog 25 U tid before meals and sliding scale and tresiba 50 U daily.  Negative for hypoglycemic events. + Peripheral neuropathy on Amitriptyline 25 mg daily.  HgA1C on 04/09/19 was 4.7. Denies polydipsia,polyuria, or polyphagia.  HTN with CHF and CAD, s/p stenting placement. + OSA on CPAP.  On Imdur 60 mg daily,Coreg 6.125 mg bid, and Hydralazine 50 mg tid. Negative for unusual/frequent headache,visual changes,CP.SOB,palpitations,or edema. No orthopnea or PND.  He follows with cardiologist, Dr Rosalita Chessman. He had labs done weekly at he dialysis center.  Lab Results  Component Value Date   CREATININE 3.89 (H) 04/23/2018   BUN 34 (H) 04/23/2018   NA 143 04/23/2018   K 3.5 04/23/2018   CL 96 (L) 04/23/2018   CO2 33 (H) 04/23/2018   COPD, he is not on inhalers. Asymptomatic.  HLD: He is on Simvastatin 40 mg daily. He follows low fat diet. Tolerating med well.  Lab Results  Component Value Date   CHOL 98 08/10/2017   HDL 25.50 (L) 08/10/2017   LDLCALC 33 08/10/2017   LDLDIRECT 80.7 05/05/2010   TRIG 196.0 (H) 08/10/2017   CHOLHDL 4 08/10/2017   He is also following with ortho for ow back  pain and chronic pain disorder. He is on Oxycodone 10 mg qid prn.   Review of Systems  Constitutional: Negative for activity change, appetite change, fatigue and fever.  HENT: Negative for mouth sores, nosebleeds and sore throat.   Eyes: Negative for redness and visual disturbance.  Respiratory: Negative for cough and wheezing.   Gastrointestinal: Negative for abdominal pain, nausea and vomiting.  Genitourinary: Negative for dysuria.  Musculoskeletal: Positive for back pain. Negative for gait problem.  Allergic/Immunologic: Positive for environmental allergies.  Neurological: Negative for syncope and facial asymmetry.  Psychiatric/Behavioral: Positive for sleep disturbance. Negative for confusion.  Rest of ROS, see pertinent positives sand negatives in HPI   Current Outpatient Medications on File Prior to Visit  Medication Sig Dispense Refill  . amitriptyline (ELAVIL) 25 MG tablet Take 25 mg by mouth at bedtime.     Marland Kitchen aspirin EC 81 MG tablet Take 81 mg by mouth daily.    Kent Osborne 1 GM 210 MG(Fe) tablet     . calcium acetate (PHOSLO) 667 MG capsule Take 1 capsule (667 mg total) by mouth 3 (three) times daily with meals. 90 capsule 0  . carvedilol (COREG) 6.25 MG tablet TAKE 1 TABLET BY MOUTH TWICE DAILY WITH A MEAL 180 tablet 0  . Cholecalciferol (VITAMIN D) 2000 units tablet Take 2,000 Units by mouth daily.    . colchicine 0.6 MG tablet 2 tabs as soon as gout symptoms start. 30 tablet 2  . glucose blood test  strip Use to test glucose 3 times daily. Dx: E 11.9 300 each 3  . hydrALAZINE (APRESOLINE) 50 MG tablet Take 1 tablet (50 mg total) by mouth 3 (three) times daily. 90 tablet 2  . isosorbide mononitrate (IMDUR) 60 MG 24 hr tablet Take 1 tablet by mouth once daily 90 tablet 0  . lidocaine-prilocaine (EMLA) cream APPLY SMALL AMOUNT TO ACCESS SITE (AVF) 1 TO 2 HOURS BEFORE DIALYSIS. COVER WITH OCCLUSIVE DRESSING (SARAN WRAP)  12  . montelukast (SINGULAIR) 10 MG tablet Take 10 mg by  mouth at bedtime.     . nitroGLYCERIN (NITROSTAT) 0.4 MG SL tablet Place 1 tablet (0.4 mg total) under the tongue every 5 (five) minutes as needed for chest pain. 30 tablet 12  . Oxycodone HCl 10 MG TABS Take 10 mg by mouth 4 (four) times daily as needed (for pain).     . simvastatin (ZOCOR) 40 MG tablet TAKE 1 TABLET BY MOUTH AT BEDTIME 90 tablet 0  . tamsulosin (FLOMAX) 0.4 MG CAPS capsule Take 1 capsule (0.4 mg total) by mouth daily. 30 capsule 0   No current facility-administered medications on file prior to visit.     Past Medical History:  Diagnosis Date  . Acute renal failure superimposed on chronic kidney disease ([place])    Kent Osborne 11/01/2016  . Anemia   . Arthritis    "back, knees" (11/01/2016)  . Asthma   . CAD (coronary artery disease) 12/31/07   [facility] stents- placed in the circumflex and LAD  . Chronic bronchitis ([place])   . Hemodialysis status ([place])   . History of blood transfusion 10/30/2016   "related to anemia"  . History of gout   . History of kidney stones   . Hyperlipidemia   . Hypertension   . NSTEMI (non-ST elevated myocardial infarction) ([place]) 10/30/2016  . OA (osteoarthritis)   . Obese   . On home oxygen therapy    "2L w/CPAP" (11/01/2016)  . OSA on CPAP   . Psoriatic arthritis ([place])   . Type II diabetes mellitus (HCC)    No Known Allergies  Social History   Socioeconomic History  . Marital status: Divorced    Spouse name: Not on file  . Number of children: 3  . Years of education: 56  . Highest education level: High school graduate  Occupational History  . Not on file  Tobacco Use  . Smoking status: Never Smoker  . Smokeless tobacco: Never Used  Substance and Sexual Activity  . Alcohol use: Not Currently    Comment: 11/01/2016 "nothing in years"  . Drug use: No  . Sexual activity: Yes  Other Topics Concern  . Not on file  Social History Narrative   Divorced   1 son and 2 daughters   2 grandsons   Retired Conservation officer, nature   Social  Determinants of Radio broadcast assistant Strain: [place]   . Difficulty of Paying Living Expenses: Not very hard  Food Insecurity: No Food Insecurity  . Worried About Charity fundraiser in the Last Year: Never true  . Ran Out of Food in the Last Year: Never true  Transportation Needs:   . Lack of Transportation (Medical): Not on file  . Lack of Transportation (Non-Medical): Not on file  Physical Activity: Inactive  . Days of Exercise per Week: 0 days  . Minutes of Exercise per Session: 0 min  Stress:   . Feeling of Stress : Not on file  Social Connections:  Unknown  . Frequency of Communication with Friends and Family: More than three times a week  . Frequency of Social Gatherings with Friends and Family: More than three times a week  . Attends Religious Services: Not on file  . Active Member of Clubs or Organizations: Not on file  . Attends Archivist Meetings: Not on file  . Marital Status: Divorced    Vitals:   08/26/19 1048  BP: 130/80  Pulse: 100  Resp: 16  Temp: (!) 96.2 F (35.7 C)  SpO2: 95%     Wt Readings from Last 3 Encounters:  08/26/19 290 lb (131.5 kg)  08/06/19 286 lb 2.5 oz (129.8 kg)  05/28/18 275 lb 4 oz (124.9 kg)    Body mass index is 44.09 kg/m.   Physical Exam  Nursing note and vitals reviewed. Constitutional: He is oriented to person, place, and time. He appears well-developed. No distress.  HENT:  Head: Normocephalic and atraumatic.  Mouth/Throat: Oropharynx is clear and moist and mucous membranes are normal.  Eyes: Pupils are equal, round, and reactive to light. Conjunctivae are normal.  Cardiovascular: Normal rate and regular rhythm.  Pulses:      Dorsalis pedis pulses are 2+ on the right side and 2+ on the left side.  Respiratory: Effort normal and breath sounds normal. No respiratory distress.  GI: Soft. He exhibits no mass. There is no abdominal tenderness.  Musculoskeletal:        General: No edema.    Lymphadenopathy:    He has no cervical adenopathy.  Neurological: He is alert and oriented to person, place, and time. He has normal strength. No cranial nerve deficit. Gait normal.  Skin: Skin is warm. No rash noted. No erythema.  Psychiatric: He has a normal mood and affect.  Well groomed, good eye contact.   ASSESSMENT AND PLAN:  [name] was seen today for Chronic disease management.  Orders Placed This Encounter  Procedures  . POC HgB A1c   Lab Results  Component Value Date   HGBA1C 7.2 (A) 08/26/2019    Type 2 diabetes mellitus with chronic kidney disease on chronic dialysis, without long-term current use of insulin ([place]) HgA1C may not be very accurate due to his Hx of anemia, HgA1C adequate given his chronic med problems.  No changes in current management. Regular exercise as tolerated and healthy diet with avoidance of added sugar food intake is an important part of treatment and recommended. Annual eye exam, periodic dental and foot care recommended. F/U in 5-6 months  -     POC HgB A1c -     insulin degludec (TRESIBA FLEXTOUCH) 100 UNIT/ML SOPN FlexTouch Pen; INJECT 50 UNITS SUB-Q ONCE DAILY -     NOVOLOG FLEXPEN 100 UNIT/ML FlexPen; INJECT 2 TO 25 UNITS SUBCUTANEOUSLY 3 TIMES A DAY WITH MEALS PER SLIDING SCALE  Heart failure, unspecified HF chronicity, unspecified heart failure type (HCC) Asymptomatic. Last echo LVEF 50-55% in 10/2016. Continue low salt diet.  Chronic obstructive pulmonary disease, unspecified COPD type ([place]) Problem is well controlled. Asymptomatic.  Diabetic peripheral neuropathy associated with type 2 diabetes mellitus ([place]) Appropriate foot care,including avoiding trauma. Continue Amitriptyline 25 mg daily.  Morbid obesity with BMI of 45.0-49.9, adult ([place]) We discussed benefits of wt loss as well as adverse effects of obesity. Consistency with healthy diet and physical activity recommended.  Type II diabetes mellitus  with peripheral circulatory disorder, uncontrolled (HCC) On Simvastatin and Aspirin. Following with cardiologist, will try to  obtain records.    Return in about 6 months (around 02/23/2020).   Sherril Heyward G. [place], MD  [facility] [place]. [place] office.

## 2019-08-26 NOTE — Patient Instructions (Addendum)
A few things to remember from today's visit:   Heart failure, unspecified HF chronicity, unspecified heart failure type ([place])  Chronic obstructive pulmonary disease, unspecified COPD type ([place]), Chronic  Diabetic peripheral neuropathy associated with type 2 diabetes mellitus ([place]), Chronic  Type 2 diabetes mellitus with chronic kidney disease on chronic dialysis, without long-term current use of insulin ([place]), Chronic - Plan: POC HgB A1c  Type II diabetes mellitus with peripheral circulatory disorder, uncontrolled ([place]), Chronic  No changes today.   Please be sure medication list is accurate. If a new problem present, please set up appointment sooner than planned today.

## 2019-08-27 DIAGNOSIS — D500 Iron deficiency anemia secondary to blood loss (chronic): Secondary | ICD-10-CM | POA: Diagnosis not present

## 2019-08-27 DIAGNOSIS — Z992 Dependence on renal dialysis: Secondary | ICD-10-CM | POA: Diagnosis not present

## 2019-08-27 DIAGNOSIS — D631 Anemia in chronic kidney disease: Secondary | ICD-10-CM | POA: Diagnosis not present

## 2019-08-27 DIAGNOSIS — N186 End stage renal disease: Secondary | ICD-10-CM | POA: Diagnosis not present

## 2019-08-27 DIAGNOSIS — N2581 Secondary hyperparathyroidism of renal origin: Secondary | ICD-10-CM | POA: Diagnosis not present

## 2019-08-28 ENCOUNTER — Encounter: Payer: Self-pay | Admitting: Family Medicine

## 2019-08-29 DIAGNOSIS — D631 Anemia in chronic kidney disease: Secondary | ICD-10-CM | POA: Diagnosis not present

## 2019-08-29 DIAGNOSIS — D500 Iron deficiency anemia secondary to blood loss (chronic): Secondary | ICD-10-CM | POA: Diagnosis not present

## 2019-08-29 DIAGNOSIS — N2581 Secondary hyperparathyroidism of renal origin: Secondary | ICD-10-CM | POA: Diagnosis not present

## 2019-08-29 DIAGNOSIS — Z992 Dependence on renal dialysis: Secondary | ICD-10-CM | POA: Diagnosis not present

## 2019-08-29 DIAGNOSIS — N186 End stage renal disease: Secondary | ICD-10-CM | POA: Diagnosis not present

## 2019-09-01 DIAGNOSIS — N2581 Secondary hyperparathyroidism of renal origin: Secondary | ICD-10-CM | POA: Diagnosis not present

## 2019-09-01 DIAGNOSIS — N186 End stage renal disease: Secondary | ICD-10-CM | POA: Diagnosis not present

## 2019-09-01 DIAGNOSIS — D631 Anemia in chronic kidney disease: Secondary | ICD-10-CM | POA: Diagnosis not present

## 2019-09-01 DIAGNOSIS — Z992 Dependence on renal dialysis: Secondary | ICD-10-CM | POA: Diagnosis not present

## 2019-09-01 DIAGNOSIS — D500 Iron deficiency anemia secondary to blood loss (chronic): Secondary | ICD-10-CM | POA: Diagnosis not present

## 2019-09-03 DIAGNOSIS — D500 Iron deficiency anemia secondary to blood loss (chronic): Secondary | ICD-10-CM | POA: Diagnosis not present

## 2019-09-03 DIAGNOSIS — D631 Anemia in chronic kidney disease: Secondary | ICD-10-CM | POA: Diagnosis not present

## 2019-09-03 DIAGNOSIS — N2581 Secondary hyperparathyroidism of renal origin: Secondary | ICD-10-CM | POA: Diagnosis not present

## 2019-09-03 DIAGNOSIS — Z992 Dependence on renal dialysis: Secondary | ICD-10-CM | POA: Diagnosis not present

## 2019-09-03 DIAGNOSIS — N186 End stage renal disease: Secondary | ICD-10-CM | POA: Diagnosis not present

## 2019-09-04 ENCOUNTER — Other Ambulatory Visit: Payer: Self-pay | Admitting: Family Medicine

## 2019-09-05 DIAGNOSIS — D631 Anemia in chronic kidney disease: Secondary | ICD-10-CM | POA: Diagnosis not present

## 2019-09-05 DIAGNOSIS — N2581 Secondary hyperparathyroidism of renal origin: Secondary | ICD-10-CM | POA: Diagnosis not present

## 2019-09-05 DIAGNOSIS — N186 End stage renal disease: Secondary | ICD-10-CM | POA: Diagnosis not present

## 2019-09-05 DIAGNOSIS — Z992 Dependence on renal dialysis: Secondary | ICD-10-CM | POA: Diagnosis not present

## 2019-09-05 DIAGNOSIS — D500 Iron deficiency anemia secondary to blood loss (chronic): Secondary | ICD-10-CM | POA: Diagnosis not present

## 2019-09-08 DIAGNOSIS — Z992 Dependence on renal dialysis: Secondary | ICD-10-CM | POA: Diagnosis not present

## 2019-09-08 DIAGNOSIS — N2581 Secondary hyperparathyroidism of renal origin: Secondary | ICD-10-CM | POA: Diagnosis not present

## 2019-09-08 DIAGNOSIS — N186 End stage renal disease: Secondary | ICD-10-CM | POA: Diagnosis not present

## 2019-09-08 DIAGNOSIS — D500 Iron deficiency anemia secondary to blood loss (chronic): Secondary | ICD-10-CM | POA: Diagnosis not present

## 2019-09-08 DIAGNOSIS — D631 Anemia in chronic kidney disease: Secondary | ICD-10-CM | POA: Diagnosis not present

## 2019-09-10 DIAGNOSIS — N186 End stage renal disease: Secondary | ICD-10-CM | POA: Diagnosis not present

## 2019-09-10 DIAGNOSIS — Z992 Dependence on renal dialysis: Secondary | ICD-10-CM | POA: Diagnosis not present

## 2019-09-10 DIAGNOSIS — N2581 Secondary hyperparathyroidism of renal origin: Secondary | ICD-10-CM | POA: Diagnosis not present

## 2019-09-10 DIAGNOSIS — D500 Iron deficiency anemia secondary to blood loss (chronic): Secondary | ICD-10-CM | POA: Diagnosis not present

## 2019-09-10 DIAGNOSIS — D631 Anemia in chronic kidney disease: Secondary | ICD-10-CM | POA: Diagnosis not present

## 2019-09-12 DIAGNOSIS — D500 Iron deficiency anemia secondary to blood loss (chronic): Secondary | ICD-10-CM | POA: Diagnosis not present

## 2019-09-12 DIAGNOSIS — N186 End stage renal disease: Secondary | ICD-10-CM | POA: Diagnosis not present

## 2019-09-12 DIAGNOSIS — D631 Anemia in chronic kidney disease: Secondary | ICD-10-CM | POA: Diagnosis not present

## 2019-09-12 DIAGNOSIS — Z992 Dependence on renal dialysis: Secondary | ICD-10-CM | POA: Diagnosis not present

## 2019-09-12 DIAGNOSIS — N2581 Secondary hyperparathyroidism of renal origin: Secondary | ICD-10-CM | POA: Diagnosis not present

## 2019-09-15 DIAGNOSIS — N186 End stage renal disease: Secondary | ICD-10-CM | POA: Diagnosis not present

## 2019-09-15 DIAGNOSIS — D631 Anemia in chronic kidney disease: Secondary | ICD-10-CM | POA: Diagnosis not present

## 2019-09-15 DIAGNOSIS — Z992 Dependence on renal dialysis: Secondary | ICD-10-CM | POA: Diagnosis not present

## 2019-09-15 DIAGNOSIS — N2581 Secondary hyperparathyroidism of renal origin: Secondary | ICD-10-CM | POA: Diagnosis not present

## 2019-09-15 DIAGNOSIS — D500 Iron deficiency anemia secondary to blood loss (chronic): Secondary | ICD-10-CM | POA: Diagnosis not present

## 2019-09-17 ENCOUNTER — Other Ambulatory Visit: Payer: Self-pay | Admitting: Urology

## 2019-09-17 DIAGNOSIS — D3002 Benign neoplasm of left kidney: Secondary | ICD-10-CM

## 2019-09-17 DIAGNOSIS — N186 End stage renal disease: Secondary | ICD-10-CM | POA: Diagnosis not present

## 2019-09-17 DIAGNOSIS — Z992 Dependence on renal dialysis: Secondary | ICD-10-CM | POA: Diagnosis not present

## 2019-09-17 DIAGNOSIS — D631 Anemia in chronic kidney disease: Secondary | ICD-10-CM | POA: Diagnosis not present

## 2019-09-17 DIAGNOSIS — D500 Iron deficiency anemia secondary to blood loss (chronic): Secondary | ICD-10-CM | POA: Diagnosis not present

## 2019-09-17 DIAGNOSIS — N2581 Secondary hyperparathyroidism of renal origin: Secondary | ICD-10-CM | POA: Diagnosis not present

## 2019-09-19 DIAGNOSIS — N2581 Secondary hyperparathyroidism of renal origin: Secondary | ICD-10-CM | POA: Diagnosis not present

## 2019-09-19 DIAGNOSIS — Z992 Dependence on renal dialysis: Secondary | ICD-10-CM | POA: Diagnosis not present

## 2019-09-19 DIAGNOSIS — N186 End stage renal disease: Secondary | ICD-10-CM | POA: Diagnosis not present

## 2019-09-19 DIAGNOSIS — D500 Iron deficiency anemia secondary to blood loss (chronic): Secondary | ICD-10-CM | POA: Diagnosis not present

## 2019-09-19 DIAGNOSIS — D631 Anemia in chronic kidney disease: Secondary | ICD-10-CM | POA: Diagnosis not present

## 2019-09-21 DIAGNOSIS — N186 End stage renal disease: Secondary | ICD-10-CM | POA: Diagnosis not present

## 2019-09-21 DIAGNOSIS — Z992 Dependence on renal dialysis: Secondary | ICD-10-CM | POA: Diagnosis not present

## 2019-09-22 DIAGNOSIS — Z992 Dependence on renal dialysis: Secondary | ICD-10-CM | POA: Diagnosis not present

## 2019-09-22 DIAGNOSIS — N186 End stage renal disease: Secondary | ICD-10-CM | POA: Diagnosis not present

## 2019-09-22 DIAGNOSIS — N2581 Secondary hyperparathyroidism of renal origin: Secondary | ICD-10-CM | POA: Diagnosis not present

## 2019-09-22 DIAGNOSIS — D500 Iron deficiency anemia secondary to blood loss (chronic): Secondary | ICD-10-CM | POA: Diagnosis not present

## 2019-09-22 DIAGNOSIS — D631 Anemia in chronic kidney disease: Secondary | ICD-10-CM | POA: Diagnosis not present

## 2019-09-22 DIAGNOSIS — E8809 Other disorders of plasma-protein metabolism, not elsewhere classified: Secondary | ICD-10-CM | POA: Diagnosis not present

## 2019-09-24 DIAGNOSIS — D500 Iron deficiency anemia secondary to blood loss (chronic): Secondary | ICD-10-CM | POA: Diagnosis not present

## 2019-09-24 DIAGNOSIS — N2581 Secondary hyperparathyroidism of renal origin: Secondary | ICD-10-CM | POA: Diagnosis not present

## 2019-09-24 DIAGNOSIS — Z992 Dependence on renal dialysis: Secondary | ICD-10-CM | POA: Diagnosis not present

## 2019-09-24 DIAGNOSIS — D631 Anemia in chronic kidney disease: Secondary | ICD-10-CM | POA: Diagnosis not present

## 2019-09-24 DIAGNOSIS — N186 End stage renal disease: Secondary | ICD-10-CM | POA: Diagnosis not present

## 2019-09-26 DIAGNOSIS — D631 Anemia in chronic kidney disease: Secondary | ICD-10-CM | POA: Diagnosis not present

## 2019-09-26 DIAGNOSIS — Z992 Dependence on renal dialysis: Secondary | ICD-10-CM | POA: Diagnosis not present

## 2019-09-26 DIAGNOSIS — D500 Iron deficiency anemia secondary to blood loss (chronic): Secondary | ICD-10-CM | POA: Diagnosis not present

## 2019-09-26 DIAGNOSIS — N186 End stage renal disease: Secondary | ICD-10-CM | POA: Diagnosis not present

## 2019-09-26 DIAGNOSIS — N2581 Secondary hyperparathyroidism of renal origin: Secondary | ICD-10-CM | POA: Diagnosis not present

## 2019-09-29 DIAGNOSIS — D500 Iron deficiency anemia secondary to blood loss (chronic): Secondary | ICD-10-CM | POA: Diagnosis not present

## 2019-09-29 DIAGNOSIS — N2581 Secondary hyperparathyroidism of renal origin: Secondary | ICD-10-CM | POA: Diagnosis not present

## 2019-09-29 DIAGNOSIS — D631 Anemia in chronic kidney disease: Secondary | ICD-10-CM | POA: Diagnosis not present

## 2019-09-29 DIAGNOSIS — Z992 Dependence on renal dialysis: Secondary | ICD-10-CM | POA: Diagnosis not present

## 2019-09-29 DIAGNOSIS — N186 End stage renal disease: Secondary | ICD-10-CM | POA: Diagnosis not present

## 2019-10-01 DIAGNOSIS — D500 Iron deficiency anemia secondary to blood loss (chronic): Secondary | ICD-10-CM | POA: Diagnosis not present

## 2019-10-01 DIAGNOSIS — Z992 Dependence on renal dialysis: Secondary | ICD-10-CM | POA: Diagnosis not present

## 2019-10-01 DIAGNOSIS — N2581 Secondary hyperparathyroidism of renal origin: Secondary | ICD-10-CM | POA: Diagnosis not present

## 2019-10-01 DIAGNOSIS — D631 Anemia in chronic kidney disease: Secondary | ICD-10-CM | POA: Diagnosis not present

## 2019-10-01 DIAGNOSIS — N186 End stage renal disease: Secondary | ICD-10-CM | POA: Diagnosis not present

## 2019-10-03 DIAGNOSIS — N2581 Secondary hyperparathyroidism of renal origin: Secondary | ICD-10-CM | POA: Diagnosis not present

## 2019-10-03 DIAGNOSIS — D631 Anemia in chronic kidney disease: Secondary | ICD-10-CM | POA: Diagnosis not present

## 2019-10-03 DIAGNOSIS — D500 Iron deficiency anemia secondary to blood loss (chronic): Secondary | ICD-10-CM | POA: Diagnosis not present

## 2019-10-03 DIAGNOSIS — Z992 Dependence on renal dialysis: Secondary | ICD-10-CM | POA: Diagnosis not present

## 2019-10-03 DIAGNOSIS — N186 End stage renal disease: Secondary | ICD-10-CM | POA: Diagnosis not present

## 2019-10-06 DIAGNOSIS — Z992 Dependence on renal dialysis: Secondary | ICD-10-CM | POA: Diagnosis not present

## 2019-10-06 DIAGNOSIS — D631 Anemia in chronic kidney disease: Secondary | ICD-10-CM | POA: Diagnosis not present

## 2019-10-06 DIAGNOSIS — N186 End stage renal disease: Secondary | ICD-10-CM | POA: Diagnosis not present

## 2019-10-06 DIAGNOSIS — N2581 Secondary hyperparathyroidism of renal origin: Secondary | ICD-10-CM | POA: Diagnosis not present

## 2019-10-06 DIAGNOSIS — D500 Iron deficiency anemia secondary to blood loss (chronic): Secondary | ICD-10-CM | POA: Diagnosis not present

## 2019-10-08 DIAGNOSIS — D500 Iron deficiency anemia secondary to blood loss (chronic): Secondary | ICD-10-CM | POA: Diagnosis not present

## 2019-10-08 DIAGNOSIS — N2581 Secondary hyperparathyroidism of renal origin: Secondary | ICD-10-CM | POA: Diagnosis not present

## 2019-10-08 DIAGNOSIS — D631 Anemia in chronic kidney disease: Secondary | ICD-10-CM | POA: Diagnosis not present

## 2019-10-08 DIAGNOSIS — N186 End stage renal disease: Secondary | ICD-10-CM | POA: Diagnosis not present

## 2019-10-08 DIAGNOSIS — Z992 Dependence on renal dialysis: Secondary | ICD-10-CM | POA: Diagnosis not present

## 2019-10-10 DIAGNOSIS — Z992 Dependence on renal dialysis: Secondary | ICD-10-CM | POA: Diagnosis not present

## 2019-10-10 DIAGNOSIS — D500 Iron deficiency anemia secondary to blood loss (chronic): Secondary | ICD-10-CM | POA: Diagnosis not present

## 2019-10-10 DIAGNOSIS — D631 Anemia in chronic kidney disease: Secondary | ICD-10-CM | POA: Diagnosis not present

## 2019-10-10 DIAGNOSIS — N2581 Secondary hyperparathyroidism of renal origin: Secondary | ICD-10-CM | POA: Diagnosis not present

## 2019-10-10 DIAGNOSIS — N186 End stage renal disease: Secondary | ICD-10-CM | POA: Diagnosis not present

## 2019-10-13 DIAGNOSIS — N2581 Secondary hyperparathyroidism of renal origin: Secondary | ICD-10-CM | POA: Diagnosis not present

## 2019-10-13 DIAGNOSIS — D500 Iron deficiency anemia secondary to blood loss (chronic): Secondary | ICD-10-CM | POA: Diagnosis not present

## 2019-10-13 DIAGNOSIS — D631 Anemia in chronic kidney disease: Secondary | ICD-10-CM | POA: Diagnosis not present

## 2019-10-13 DIAGNOSIS — Z992 Dependence on renal dialysis: Secondary | ICD-10-CM | POA: Diagnosis not present

## 2019-10-13 DIAGNOSIS — N186 End stage renal disease: Secondary | ICD-10-CM | POA: Diagnosis not present

## 2019-10-15 DIAGNOSIS — N186 End stage renal disease: Secondary | ICD-10-CM | POA: Diagnosis not present

## 2019-10-15 DIAGNOSIS — N2581 Secondary hyperparathyroidism of renal origin: Secondary | ICD-10-CM | POA: Diagnosis not present

## 2019-10-15 DIAGNOSIS — Z992 Dependence on renal dialysis: Secondary | ICD-10-CM | POA: Diagnosis not present

## 2019-10-15 DIAGNOSIS — D500 Iron deficiency anemia secondary to blood loss (chronic): Secondary | ICD-10-CM | POA: Diagnosis not present

## 2019-10-15 DIAGNOSIS — D631 Anemia in chronic kidney disease: Secondary | ICD-10-CM | POA: Diagnosis not present

## 2019-10-17 DIAGNOSIS — D631 Anemia in chronic kidney disease: Secondary | ICD-10-CM | POA: Diagnosis not present

## 2019-10-17 DIAGNOSIS — D500 Iron deficiency anemia secondary to blood loss (chronic): Secondary | ICD-10-CM | POA: Diagnosis not present

## 2019-10-17 DIAGNOSIS — N186 End stage renal disease: Secondary | ICD-10-CM | POA: Diagnosis not present

## 2019-10-17 DIAGNOSIS — Z992 Dependence on renal dialysis: Secondary | ICD-10-CM | POA: Diagnosis not present

## 2019-10-17 DIAGNOSIS — N2581 Secondary hyperparathyroidism of renal origin: Secondary | ICD-10-CM | POA: Diagnosis not present

## 2019-10-19 DIAGNOSIS — Z992 Dependence on renal dialysis: Secondary | ICD-10-CM | POA: Diagnosis not present

## 2019-10-19 DIAGNOSIS — N186 End stage renal disease: Secondary | ICD-10-CM | POA: Diagnosis not present

## 2019-10-20 DIAGNOSIS — D6959 Other secondary thrombocytopenia: Secondary | ICD-10-CM | POA: Diagnosis not present

## 2019-10-20 DIAGNOSIS — N186 End stage renal disease: Secondary | ICD-10-CM | POA: Diagnosis not present

## 2019-10-20 DIAGNOSIS — D631 Anemia in chronic kidney disease: Secondary | ICD-10-CM | POA: Diagnosis not present

## 2019-10-20 DIAGNOSIS — D500 Iron deficiency anemia secondary to blood loss (chronic): Secondary | ICD-10-CM | POA: Diagnosis not present

## 2019-10-20 DIAGNOSIS — N2581 Secondary hyperparathyroidism of renal origin: Secondary | ICD-10-CM | POA: Diagnosis not present

## 2019-10-20 DIAGNOSIS — Z992 Dependence on renal dialysis: Secondary | ICD-10-CM | POA: Diagnosis not present

## 2019-10-22 DIAGNOSIS — N186 End stage renal disease: Secondary | ICD-10-CM | POA: Diagnosis not present

## 2019-10-22 DIAGNOSIS — N2581 Secondary hyperparathyroidism of renal origin: Secondary | ICD-10-CM | POA: Diagnosis not present

## 2019-10-22 DIAGNOSIS — D500 Iron deficiency anemia secondary to blood loss (chronic): Secondary | ICD-10-CM | POA: Diagnosis not present

## 2019-10-22 DIAGNOSIS — Z992 Dependence on renal dialysis: Secondary | ICD-10-CM | POA: Diagnosis not present

## 2019-10-22 DIAGNOSIS — D631 Anemia in chronic kidney disease: Secondary | ICD-10-CM | POA: Diagnosis not present

## 2019-10-24 DIAGNOSIS — N186 End stage renal disease: Secondary | ICD-10-CM | POA: Diagnosis not present

## 2019-10-24 DIAGNOSIS — Z992 Dependence on renal dialysis: Secondary | ICD-10-CM | POA: Diagnosis not present

## 2019-10-24 DIAGNOSIS — D500 Iron deficiency anemia secondary to blood loss (chronic): Secondary | ICD-10-CM | POA: Diagnosis not present

## 2019-10-24 DIAGNOSIS — N2581 Secondary hyperparathyroidism of renal origin: Secondary | ICD-10-CM | POA: Diagnosis not present

## 2019-10-24 DIAGNOSIS — D631 Anemia in chronic kidney disease: Secondary | ICD-10-CM | POA: Diagnosis not present

## 2019-10-27 DIAGNOSIS — D631 Anemia in chronic kidney disease: Secondary | ICD-10-CM | POA: Diagnosis not present

## 2019-10-27 DIAGNOSIS — N2581 Secondary hyperparathyroidism of renal origin: Secondary | ICD-10-CM | POA: Diagnosis not present

## 2019-10-27 DIAGNOSIS — N186 End stage renal disease: Secondary | ICD-10-CM | POA: Diagnosis not present

## 2019-10-27 DIAGNOSIS — D500 Iron deficiency anemia secondary to blood loss (chronic): Secondary | ICD-10-CM | POA: Diagnosis not present

## 2019-10-27 DIAGNOSIS — Z992 Dependence on renal dialysis: Secondary | ICD-10-CM | POA: Diagnosis not present

## 2019-10-28 DIAGNOSIS — Z23 Encounter for immunization: Secondary | ICD-10-CM | POA: Diagnosis not present

## 2019-10-29 DIAGNOSIS — D631 Anemia in chronic kidney disease: Secondary | ICD-10-CM | POA: Diagnosis not present

## 2019-10-29 DIAGNOSIS — D500 Iron deficiency anemia secondary to blood loss (chronic): Secondary | ICD-10-CM | POA: Diagnosis not present

## 2019-10-29 DIAGNOSIS — E1129 Type 2 diabetes mellitus with other diabetic kidney complication: Secondary | ICD-10-CM | POA: Diagnosis not present

## 2019-10-29 DIAGNOSIS — N2581 Secondary hyperparathyroidism of renal origin: Secondary | ICD-10-CM | POA: Diagnosis not present

## 2019-10-29 DIAGNOSIS — Z992 Dependence on renal dialysis: Secondary | ICD-10-CM | POA: Diagnosis not present

## 2019-10-29 DIAGNOSIS — N186 End stage renal disease: Secondary | ICD-10-CM | POA: Diagnosis not present

## 2019-10-30 DIAGNOSIS — I10 Essential (primary) hypertension: Secondary | ICD-10-CM | POA: Diagnosis not present

## 2019-10-30 DIAGNOSIS — E785 Hyperlipidemia, unspecified: Secondary | ICD-10-CM | POA: Diagnosis not present

## 2019-10-30 DIAGNOSIS — I872 Venous insufficiency (chronic) (peripheral): Secondary | ICD-10-CM | POA: Diagnosis not present

## 2019-10-30 DIAGNOSIS — I2510 Atherosclerotic heart disease of native coronary artery without angina pectoris: Secondary | ICD-10-CM | POA: Diagnosis not present

## 2019-10-30 DIAGNOSIS — R002 Palpitations: Secondary | ICD-10-CM | POA: Diagnosis not present

## 2019-10-30 DIAGNOSIS — N186 End stage renal disease: Secondary | ICD-10-CM | POA: Diagnosis not present

## 2019-10-31 DIAGNOSIS — D500 Iron deficiency anemia secondary to blood loss (chronic): Secondary | ICD-10-CM | POA: Diagnosis not present

## 2019-10-31 DIAGNOSIS — Z992 Dependence on renal dialysis: Secondary | ICD-10-CM | POA: Diagnosis not present

## 2019-10-31 DIAGNOSIS — D631 Anemia in chronic kidney disease: Secondary | ICD-10-CM | POA: Diagnosis not present

## 2019-10-31 DIAGNOSIS — N186 End stage renal disease: Secondary | ICD-10-CM | POA: Diagnosis not present

## 2019-10-31 DIAGNOSIS — N2581 Secondary hyperparathyroidism of renal origin: Secondary | ICD-10-CM | POA: Diagnosis not present

## 2019-11-03 DIAGNOSIS — D631 Anemia in chronic kidney disease: Secondary | ICD-10-CM | POA: Diagnosis not present

## 2019-11-03 DIAGNOSIS — N186 End stage renal disease: Secondary | ICD-10-CM | POA: Diagnosis not present

## 2019-11-03 DIAGNOSIS — N2581 Secondary hyperparathyroidism of renal origin: Secondary | ICD-10-CM | POA: Diagnosis not present

## 2019-11-03 DIAGNOSIS — Z992 Dependence on renal dialysis: Secondary | ICD-10-CM | POA: Diagnosis not present

## 2019-11-03 DIAGNOSIS — D500 Iron deficiency anemia secondary to blood loss (chronic): Secondary | ICD-10-CM | POA: Diagnosis not present

## 2019-11-05 DIAGNOSIS — N2581 Secondary hyperparathyroidism of renal origin: Secondary | ICD-10-CM | POA: Diagnosis not present

## 2019-11-05 DIAGNOSIS — D500 Iron deficiency anemia secondary to blood loss (chronic): Secondary | ICD-10-CM | POA: Diagnosis not present

## 2019-11-05 DIAGNOSIS — D631 Anemia in chronic kidney disease: Secondary | ICD-10-CM | POA: Diagnosis not present

## 2019-11-05 DIAGNOSIS — N186 End stage renal disease: Secondary | ICD-10-CM | POA: Diagnosis not present

## 2019-11-05 DIAGNOSIS — Z992 Dependence on renal dialysis: Secondary | ICD-10-CM | POA: Diagnosis not present

## 2019-11-06 DIAGNOSIS — E113293 Type 2 diabetes mellitus with mild nonproliferative diabetic retinopathy without macular edema, bilateral: Secondary | ICD-10-CM | POA: Diagnosis not present

## 2019-11-06 LAB — HM DIABETES EYE EXAM

## 2019-11-07 DIAGNOSIS — N186 End stage renal disease: Secondary | ICD-10-CM | POA: Diagnosis not present

## 2019-11-07 DIAGNOSIS — Z992 Dependence on renal dialysis: Secondary | ICD-10-CM | POA: Diagnosis not present

## 2019-11-07 DIAGNOSIS — D631 Anemia in chronic kidney disease: Secondary | ICD-10-CM | POA: Diagnosis not present

## 2019-11-07 DIAGNOSIS — N2581 Secondary hyperparathyroidism of renal origin: Secondary | ICD-10-CM | POA: Diagnosis not present

## 2019-11-07 DIAGNOSIS — D500 Iron deficiency anemia secondary to blood loss (chronic): Secondary | ICD-10-CM | POA: Diagnosis not present

## 2019-11-10 DIAGNOSIS — N2581 Secondary hyperparathyroidism of renal origin: Secondary | ICD-10-CM | POA: Diagnosis not present

## 2019-11-10 DIAGNOSIS — Z992 Dependence on renal dialysis: Secondary | ICD-10-CM | POA: Diagnosis not present

## 2019-11-10 DIAGNOSIS — G4700 Insomnia, unspecified: Secondary | ICD-10-CM | POA: Diagnosis not present

## 2019-11-10 DIAGNOSIS — D500 Iron deficiency anemia secondary to blood loss (chronic): Secondary | ICD-10-CM | POA: Diagnosis not present

## 2019-11-10 DIAGNOSIS — N186 End stage renal disease: Secondary | ICD-10-CM | POA: Diagnosis not present

## 2019-11-10 DIAGNOSIS — D631 Anemia in chronic kidney disease: Secondary | ICD-10-CM | POA: Diagnosis not present

## 2019-11-10 DIAGNOSIS — M47896 Other spondylosis, lumbar region: Secondary | ICD-10-CM | POA: Diagnosis not present

## 2019-11-10 DIAGNOSIS — M545 Low back pain: Secondary | ICD-10-CM | POA: Diagnosis not present

## 2019-11-10 DIAGNOSIS — G8929 Other chronic pain: Secondary | ICD-10-CM | POA: Diagnosis not present

## 2019-11-10 DIAGNOSIS — Z79899 Other long term (current) drug therapy: Secondary | ICD-10-CM | POA: Diagnosis not present

## 2019-11-11 DIAGNOSIS — E1140 Type 2 diabetes mellitus with diabetic neuropathy, unspecified: Secondary | ICD-10-CM | POA: Diagnosis not present

## 2019-11-11 DIAGNOSIS — M79673 Pain in unspecified foot: Secondary | ICD-10-CM | POA: Diagnosis not present

## 2019-11-11 DIAGNOSIS — Z992 Dependence on renal dialysis: Secondary | ICD-10-CM | POA: Diagnosis not present

## 2019-11-11 DIAGNOSIS — M2010 Hallux valgus (acquired), unspecified foot: Secondary | ICD-10-CM | POA: Diagnosis not present

## 2019-11-12 DIAGNOSIS — D500 Iron deficiency anemia secondary to blood loss (chronic): Secondary | ICD-10-CM | POA: Diagnosis not present

## 2019-11-12 DIAGNOSIS — Z992 Dependence on renal dialysis: Secondary | ICD-10-CM | POA: Diagnosis not present

## 2019-11-12 DIAGNOSIS — N2581 Secondary hyperparathyroidism of renal origin: Secondary | ICD-10-CM | POA: Diagnosis not present

## 2019-11-12 DIAGNOSIS — D631 Anemia in chronic kidney disease: Secondary | ICD-10-CM | POA: Diagnosis not present

## 2019-11-12 DIAGNOSIS — N186 End stage renal disease: Secondary | ICD-10-CM | POA: Diagnosis not present

## 2019-11-14 DIAGNOSIS — N186 End stage renal disease: Secondary | ICD-10-CM | POA: Diagnosis not present

## 2019-11-14 DIAGNOSIS — D631 Anemia in chronic kidney disease: Secondary | ICD-10-CM | POA: Diagnosis not present

## 2019-11-14 DIAGNOSIS — Z992 Dependence on renal dialysis: Secondary | ICD-10-CM | POA: Diagnosis not present

## 2019-11-14 DIAGNOSIS — N2581 Secondary hyperparathyroidism of renal origin: Secondary | ICD-10-CM | POA: Diagnosis not present

## 2019-11-14 DIAGNOSIS — D500 Iron deficiency anemia secondary to blood loss (chronic): Secondary | ICD-10-CM | POA: Diagnosis not present

## 2019-11-17 DIAGNOSIS — D631 Anemia in chronic kidney disease: Secondary | ICD-10-CM | POA: Diagnosis not present

## 2019-11-17 DIAGNOSIS — N2581 Secondary hyperparathyroidism of renal origin: Secondary | ICD-10-CM | POA: Diagnosis not present

## 2019-11-17 DIAGNOSIS — N186 End stage renal disease: Secondary | ICD-10-CM | POA: Diagnosis not present

## 2019-11-17 DIAGNOSIS — Z992 Dependence on renal dialysis: Secondary | ICD-10-CM | POA: Diagnosis not present

## 2019-11-17 DIAGNOSIS — D500 Iron deficiency anemia secondary to blood loss (chronic): Secondary | ICD-10-CM | POA: Diagnosis not present

## 2019-11-18 ENCOUNTER — Ambulatory Visit
Admission: RE | Admit: 2019-11-18 | Discharge: 2019-11-18 | Disposition: A | Discharge status: Home / Self Care | Payer: Medicare Other | Source: Ambulatory Visit | Attending: Urology | Admitting: Urology

## 2019-11-18 DIAGNOSIS — N2889 Other specified disorders of kidney and ureter: Secondary | ICD-10-CM | POA: Diagnosis not present

## 2019-11-18 DIAGNOSIS — D3002 Benign neoplasm of left kidney: Secondary | ICD-10-CM

## 2019-11-19 DIAGNOSIS — D500 Iron deficiency anemia secondary to blood loss (chronic): Secondary | ICD-10-CM | POA: Diagnosis not present

## 2019-11-19 DIAGNOSIS — D631 Anemia in chronic kidney disease: Secondary | ICD-10-CM | POA: Diagnosis not present

## 2019-11-19 DIAGNOSIS — N2581 Secondary hyperparathyroidism of renal origin: Secondary | ICD-10-CM | POA: Diagnosis not present

## 2019-11-19 DIAGNOSIS — Z992 Dependence on renal dialysis: Secondary | ICD-10-CM | POA: Diagnosis not present

## 2019-11-19 DIAGNOSIS — N186 End stage renal disease: Secondary | ICD-10-CM | POA: Diagnosis not present

## 2019-11-21 DIAGNOSIS — Z23 Encounter for immunization: Secondary | ICD-10-CM | POA: Diagnosis not present

## 2019-11-21 DIAGNOSIS — D500 Iron deficiency anemia secondary to blood loss (chronic): Secondary | ICD-10-CM | POA: Diagnosis not present

## 2019-11-21 DIAGNOSIS — N2581 Secondary hyperparathyroidism of renal origin: Secondary | ICD-10-CM | POA: Diagnosis not present

## 2019-11-21 DIAGNOSIS — E8809 Other disorders of plasma-protein metabolism, not elsewhere classified: Secondary | ICD-10-CM | POA: Diagnosis not present

## 2019-11-21 DIAGNOSIS — D631 Anemia in chronic kidney disease: Secondary | ICD-10-CM | POA: Diagnosis not present

## 2019-11-21 DIAGNOSIS — N186 End stage renal disease: Secondary | ICD-10-CM | POA: Diagnosis not present

## 2019-11-21 DIAGNOSIS — Z992 Dependence on renal dialysis: Secondary | ICD-10-CM | POA: Diagnosis not present

## 2019-11-24 DIAGNOSIS — Z992 Dependence on renal dialysis: Secondary | ICD-10-CM | POA: Diagnosis not present

## 2019-11-24 DIAGNOSIS — N2581 Secondary hyperparathyroidism of renal origin: Secondary | ICD-10-CM | POA: Diagnosis not present

## 2019-11-24 DIAGNOSIS — N186 End stage renal disease: Secondary | ICD-10-CM | POA: Diagnosis not present

## 2019-11-24 DIAGNOSIS — D631 Anemia in chronic kidney disease: Secondary | ICD-10-CM | POA: Diagnosis not present

## 2019-11-24 DIAGNOSIS — E8809 Other disorders of plasma-protein metabolism, not elsewhere classified: Secondary | ICD-10-CM | POA: Diagnosis not present

## 2019-11-24 DIAGNOSIS — D500 Iron deficiency anemia secondary to blood loss (chronic): Secondary | ICD-10-CM | POA: Diagnosis not present

## 2019-11-26 DIAGNOSIS — N186 End stage renal disease: Secondary | ICD-10-CM | POA: Diagnosis not present

## 2019-11-26 DIAGNOSIS — D631 Anemia in chronic kidney disease: Secondary | ICD-10-CM | POA: Diagnosis not present

## 2019-11-26 DIAGNOSIS — D500 Iron deficiency anemia secondary to blood loss (chronic): Secondary | ICD-10-CM | POA: Diagnosis not present

## 2019-11-26 DIAGNOSIS — N2581 Secondary hyperparathyroidism of renal origin: Secondary | ICD-10-CM | POA: Diagnosis not present

## 2019-11-26 DIAGNOSIS — E8809 Other disorders of plasma-protein metabolism, not elsewhere classified: Secondary | ICD-10-CM | POA: Diagnosis not present

## 2019-11-26 DIAGNOSIS — Z992 Dependence on renal dialysis: Secondary | ICD-10-CM | POA: Diagnosis not present

## 2019-11-27 DIAGNOSIS — B351 Tinea unguium: Secondary | ICD-10-CM | POA: Diagnosis not present

## 2019-11-27 DIAGNOSIS — Z992 Dependence on renal dialysis: Secondary | ICD-10-CM | POA: Diagnosis not present

## 2019-11-27 DIAGNOSIS — E1140 Type 2 diabetes mellitus with diabetic neuropathy, unspecified: Secondary | ICD-10-CM | POA: Diagnosis not present

## 2019-11-27 DIAGNOSIS — M79673 Pain in unspecified foot: Secondary | ICD-10-CM | POA: Diagnosis not present

## 2019-11-28 DIAGNOSIS — D500 Iron deficiency anemia secondary to blood loss (chronic): Secondary | ICD-10-CM | POA: Diagnosis not present

## 2019-11-28 DIAGNOSIS — N186 End stage renal disease: Secondary | ICD-10-CM | POA: Diagnosis not present

## 2019-11-28 DIAGNOSIS — Z992 Dependence on renal dialysis: Secondary | ICD-10-CM | POA: Diagnosis not present

## 2019-11-28 DIAGNOSIS — N2581 Secondary hyperparathyroidism of renal origin: Secondary | ICD-10-CM | POA: Diagnosis not present

## 2019-11-28 DIAGNOSIS — E8809 Other disorders of plasma-protein metabolism, not elsewhere classified: Secondary | ICD-10-CM | POA: Diagnosis not present

## 2019-11-28 DIAGNOSIS — D631 Anemia in chronic kidney disease: Secondary | ICD-10-CM | POA: Diagnosis not present

## 2019-12-01 DIAGNOSIS — N2581 Secondary hyperparathyroidism of renal origin: Secondary | ICD-10-CM | POA: Diagnosis not present

## 2019-12-01 DIAGNOSIS — D631 Anemia in chronic kidney disease: Secondary | ICD-10-CM | POA: Diagnosis not present

## 2019-12-01 DIAGNOSIS — D500 Iron deficiency anemia secondary to blood loss (chronic): Secondary | ICD-10-CM | POA: Diagnosis not present

## 2019-12-01 DIAGNOSIS — E8809 Other disorders of plasma-protein metabolism, not elsewhere classified: Secondary | ICD-10-CM | POA: Diagnosis not present

## 2019-12-01 DIAGNOSIS — N186 End stage renal disease: Secondary | ICD-10-CM | POA: Diagnosis not present

## 2019-12-01 DIAGNOSIS — Z992 Dependence on renal dialysis: Secondary | ICD-10-CM | POA: Diagnosis not present

## 2019-12-03 DIAGNOSIS — N186 End stage renal disease: Secondary | ICD-10-CM | POA: Diagnosis not present

## 2019-12-03 DIAGNOSIS — Z992 Dependence on renal dialysis: Secondary | ICD-10-CM | POA: Diagnosis not present

## 2019-12-03 DIAGNOSIS — D631 Anemia in chronic kidney disease: Secondary | ICD-10-CM | POA: Diagnosis not present

## 2019-12-03 DIAGNOSIS — E8809 Other disorders of plasma-protein metabolism, not elsewhere classified: Secondary | ICD-10-CM | POA: Diagnosis not present

## 2019-12-03 DIAGNOSIS — D500 Iron deficiency anemia secondary to blood loss (chronic): Secondary | ICD-10-CM | POA: Diagnosis not present

## 2019-12-03 DIAGNOSIS — N2581 Secondary hyperparathyroidism of renal origin: Secondary | ICD-10-CM | POA: Diagnosis not present

## 2019-12-05 DIAGNOSIS — D500 Iron deficiency anemia secondary to blood loss (chronic): Secondary | ICD-10-CM | POA: Diagnosis not present

## 2019-12-05 DIAGNOSIS — N2581 Secondary hyperparathyroidism of renal origin: Secondary | ICD-10-CM | POA: Diagnosis not present

## 2019-12-05 DIAGNOSIS — N186 End stage renal disease: Secondary | ICD-10-CM | POA: Diagnosis not present

## 2019-12-05 DIAGNOSIS — Z992 Dependence on renal dialysis: Secondary | ICD-10-CM | POA: Diagnosis not present

## 2019-12-05 DIAGNOSIS — D631 Anemia in chronic kidney disease: Secondary | ICD-10-CM | POA: Diagnosis not present

## 2019-12-05 DIAGNOSIS — E8809 Other disorders of plasma-protein metabolism, not elsewhere classified: Secondary | ICD-10-CM | POA: Diagnosis not present

## 2019-12-08 DIAGNOSIS — Z992 Dependence on renal dialysis: Secondary | ICD-10-CM | POA: Diagnosis not present

## 2019-12-08 DIAGNOSIS — E8809 Other disorders of plasma-protein metabolism, not elsewhere classified: Secondary | ICD-10-CM | POA: Diagnosis not present

## 2019-12-08 DIAGNOSIS — D631 Anemia in chronic kidney disease: Secondary | ICD-10-CM | POA: Diagnosis not present

## 2019-12-08 DIAGNOSIS — D500 Iron deficiency anemia secondary to blood loss (chronic): Secondary | ICD-10-CM | POA: Diagnosis not present

## 2019-12-08 DIAGNOSIS — N2581 Secondary hyperparathyroidism of renal origin: Secondary | ICD-10-CM | POA: Diagnosis not present

## 2019-12-08 DIAGNOSIS — N186 End stage renal disease: Secondary | ICD-10-CM | POA: Diagnosis not present

## 2019-12-09 ENCOUNTER — Other Ambulatory Visit: Payer: Self-pay | Admitting: Family Medicine

## 2019-12-09 DIAGNOSIS — E785 Hyperlipidemia, unspecified: Secondary | ICD-10-CM

## 2019-12-10 DIAGNOSIS — E8809 Other disorders of plasma-protein metabolism, not elsewhere classified: Secondary | ICD-10-CM | POA: Diagnosis not present

## 2019-12-10 DIAGNOSIS — D631 Anemia in chronic kidney disease: Secondary | ICD-10-CM | POA: Diagnosis not present

## 2019-12-10 DIAGNOSIS — N186 End stage renal disease: Secondary | ICD-10-CM | POA: Diagnosis not present

## 2019-12-10 DIAGNOSIS — Z992 Dependence on renal dialysis: Secondary | ICD-10-CM | POA: Diagnosis not present

## 2019-12-10 DIAGNOSIS — N2581 Secondary hyperparathyroidism of renal origin: Secondary | ICD-10-CM | POA: Diagnosis not present

## 2019-12-10 DIAGNOSIS — D500 Iron deficiency anemia secondary to blood loss (chronic): Secondary | ICD-10-CM | POA: Diagnosis not present

## 2019-12-12 DIAGNOSIS — N186 End stage renal disease: Secondary | ICD-10-CM | POA: Diagnosis not present

## 2019-12-12 DIAGNOSIS — D631 Anemia in chronic kidney disease: Secondary | ICD-10-CM | POA: Diagnosis not present

## 2019-12-12 DIAGNOSIS — Z992 Dependence on renal dialysis: Secondary | ICD-10-CM | POA: Diagnosis not present

## 2019-12-12 DIAGNOSIS — N2581 Secondary hyperparathyroidism of renal origin: Secondary | ICD-10-CM | POA: Diagnosis not present

## 2019-12-12 DIAGNOSIS — E8809 Other disorders of plasma-protein metabolism, not elsewhere classified: Secondary | ICD-10-CM | POA: Diagnosis not present

## 2019-12-12 DIAGNOSIS — D500 Iron deficiency anemia secondary to blood loss (chronic): Secondary | ICD-10-CM | POA: Diagnosis not present

## 2019-12-15 DIAGNOSIS — N2581 Secondary hyperparathyroidism of renal origin: Secondary | ICD-10-CM | POA: Diagnosis not present

## 2019-12-15 DIAGNOSIS — D631 Anemia in chronic kidney disease: Secondary | ICD-10-CM | POA: Diagnosis not present

## 2019-12-15 DIAGNOSIS — N186 End stage renal disease: Secondary | ICD-10-CM | POA: Diagnosis not present

## 2019-12-15 DIAGNOSIS — D500 Iron deficiency anemia secondary to blood loss (chronic): Secondary | ICD-10-CM | POA: Diagnosis not present

## 2019-12-15 DIAGNOSIS — E8809 Other disorders of plasma-protein metabolism, not elsewhere classified: Secondary | ICD-10-CM | POA: Diagnosis not present

## 2019-12-15 DIAGNOSIS — Z992 Dependence on renal dialysis: Secondary | ICD-10-CM | POA: Diagnosis not present

## 2019-12-17 DIAGNOSIS — N2581 Secondary hyperparathyroidism of renal origin: Secondary | ICD-10-CM | POA: Diagnosis not present

## 2019-12-17 DIAGNOSIS — E8809 Other disorders of plasma-protein metabolism, not elsewhere classified: Secondary | ICD-10-CM | POA: Diagnosis not present

## 2019-12-17 DIAGNOSIS — D631 Anemia in chronic kidney disease: Secondary | ICD-10-CM | POA: Diagnosis not present

## 2019-12-17 DIAGNOSIS — Z992 Dependence on renal dialysis: Secondary | ICD-10-CM | POA: Diagnosis not present

## 2019-12-17 DIAGNOSIS — D500 Iron deficiency anemia secondary to blood loss (chronic): Secondary | ICD-10-CM | POA: Diagnosis not present

## 2019-12-17 DIAGNOSIS — N186 End stage renal disease: Secondary | ICD-10-CM | POA: Diagnosis not present

## 2019-12-19 DIAGNOSIS — Z992 Dependence on renal dialysis: Secondary | ICD-10-CM | POA: Diagnosis not present

## 2019-12-19 DIAGNOSIS — E8809 Other disorders of plasma-protein metabolism, not elsewhere classified: Secondary | ICD-10-CM | POA: Diagnosis not present

## 2019-12-19 DIAGNOSIS — D631 Anemia in chronic kidney disease: Secondary | ICD-10-CM | POA: Diagnosis not present

## 2019-12-19 DIAGNOSIS — D500 Iron deficiency anemia secondary to blood loss (chronic): Secondary | ICD-10-CM | POA: Diagnosis not present

## 2019-12-19 DIAGNOSIS — N186 End stage renal disease: Secondary | ICD-10-CM | POA: Diagnosis not present

## 2019-12-19 DIAGNOSIS — N2581 Secondary hyperparathyroidism of renal origin: Secondary | ICD-10-CM | POA: Diagnosis not present

## 2019-12-22 DIAGNOSIS — N2581 Secondary hyperparathyroidism of renal origin: Secondary | ICD-10-CM | POA: Diagnosis not present

## 2019-12-22 DIAGNOSIS — D500 Iron deficiency anemia secondary to blood loss (chronic): Secondary | ICD-10-CM | POA: Diagnosis not present

## 2019-12-22 DIAGNOSIS — Z992 Dependence on renal dialysis: Secondary | ICD-10-CM | POA: Diagnosis not present

## 2019-12-22 DIAGNOSIS — D631 Anemia in chronic kidney disease: Secondary | ICD-10-CM | POA: Diagnosis not present

## 2019-12-22 DIAGNOSIS — N186 End stage renal disease: Secondary | ICD-10-CM | POA: Diagnosis not present

## 2019-12-22 DIAGNOSIS — E8809 Other disorders of plasma-protein metabolism, not elsewhere classified: Secondary | ICD-10-CM | POA: Diagnosis not present

## 2019-12-24 DIAGNOSIS — E8809 Other disorders of plasma-protein metabolism, not elsewhere classified: Secondary | ICD-10-CM | POA: Diagnosis not present

## 2019-12-24 DIAGNOSIS — D631 Anemia in chronic kidney disease: Secondary | ICD-10-CM | POA: Diagnosis not present

## 2019-12-24 DIAGNOSIS — N186 End stage renal disease: Secondary | ICD-10-CM | POA: Diagnosis not present

## 2019-12-24 DIAGNOSIS — D500 Iron deficiency anemia secondary to blood loss (chronic): Secondary | ICD-10-CM | POA: Diagnosis not present

## 2019-12-24 DIAGNOSIS — Z992 Dependence on renal dialysis: Secondary | ICD-10-CM | POA: Diagnosis not present

## 2019-12-24 DIAGNOSIS — N2581 Secondary hyperparathyroidism of renal origin: Secondary | ICD-10-CM | POA: Diagnosis not present

## 2019-12-26 DIAGNOSIS — N186 End stage renal disease: Secondary | ICD-10-CM | POA: Diagnosis not present

## 2019-12-26 DIAGNOSIS — D500 Iron deficiency anemia secondary to blood loss (chronic): Secondary | ICD-10-CM | POA: Diagnosis not present

## 2019-12-26 DIAGNOSIS — E8809 Other disorders of plasma-protein metabolism, not elsewhere classified: Secondary | ICD-10-CM | POA: Diagnosis not present

## 2019-12-26 DIAGNOSIS — N2581 Secondary hyperparathyroidism of renal origin: Secondary | ICD-10-CM | POA: Diagnosis not present

## 2019-12-26 DIAGNOSIS — D631 Anemia in chronic kidney disease: Secondary | ICD-10-CM | POA: Diagnosis not present

## 2019-12-26 DIAGNOSIS — Z992 Dependence on renal dialysis: Secondary | ICD-10-CM | POA: Diagnosis not present

## 2019-12-29 DIAGNOSIS — Z992 Dependence on renal dialysis: Secondary | ICD-10-CM | POA: Diagnosis not present

## 2019-12-29 DIAGNOSIS — D500 Iron deficiency anemia secondary to blood loss (chronic): Secondary | ICD-10-CM | POA: Diagnosis not present

## 2019-12-29 DIAGNOSIS — D631 Anemia in chronic kidney disease: Secondary | ICD-10-CM | POA: Diagnosis not present

## 2019-12-29 DIAGNOSIS — N186 End stage renal disease: Secondary | ICD-10-CM | POA: Diagnosis not present

## 2019-12-29 DIAGNOSIS — E8809 Other disorders of plasma-protein metabolism, not elsewhere classified: Secondary | ICD-10-CM | POA: Diagnosis not present

## 2019-12-29 DIAGNOSIS — N2581 Secondary hyperparathyroidism of renal origin: Secondary | ICD-10-CM | POA: Diagnosis not present

## 2019-12-31 DIAGNOSIS — D631 Anemia in chronic kidney disease: Secondary | ICD-10-CM | POA: Diagnosis not present

## 2019-12-31 DIAGNOSIS — N186 End stage renal disease: Secondary | ICD-10-CM | POA: Diagnosis not present

## 2019-12-31 DIAGNOSIS — D500 Iron deficiency anemia secondary to blood loss (chronic): Secondary | ICD-10-CM | POA: Diagnosis not present

## 2019-12-31 DIAGNOSIS — Z992 Dependence on renal dialysis: Secondary | ICD-10-CM | POA: Diagnosis not present

## 2019-12-31 DIAGNOSIS — N2581 Secondary hyperparathyroidism of renal origin: Secondary | ICD-10-CM | POA: Diagnosis not present

## 2019-12-31 DIAGNOSIS — E8809 Other disorders of plasma-protein metabolism, not elsewhere classified: Secondary | ICD-10-CM | POA: Diagnosis not present

## 2020-01-02 DIAGNOSIS — N186 End stage renal disease: Secondary | ICD-10-CM | POA: Diagnosis not present

## 2020-01-02 DIAGNOSIS — E8809 Other disorders of plasma-protein metabolism, not elsewhere classified: Secondary | ICD-10-CM | POA: Diagnosis not present

## 2020-01-02 DIAGNOSIS — Z992 Dependence on renal dialysis: Secondary | ICD-10-CM | POA: Diagnosis not present

## 2020-01-02 DIAGNOSIS — D631 Anemia in chronic kidney disease: Secondary | ICD-10-CM | POA: Diagnosis not present

## 2020-01-02 DIAGNOSIS — N2581 Secondary hyperparathyroidism of renal origin: Secondary | ICD-10-CM | POA: Diagnosis not present

## 2020-01-02 DIAGNOSIS — D500 Iron deficiency anemia secondary to blood loss (chronic): Secondary | ICD-10-CM | POA: Diagnosis not present

## 2020-01-05 ENCOUNTER — Other Ambulatory Visit: Payer: Self-pay

## 2020-01-05 DIAGNOSIS — Z992 Dependence on renal dialysis: Secondary | ICD-10-CM | POA: Diagnosis not present

## 2020-01-05 DIAGNOSIS — D500 Iron deficiency anemia secondary to blood loss (chronic): Secondary | ICD-10-CM | POA: Diagnosis not present

## 2020-01-05 DIAGNOSIS — N2581 Secondary hyperparathyroidism of renal origin: Secondary | ICD-10-CM | POA: Diagnosis not present

## 2020-01-05 DIAGNOSIS — E8809 Other disorders of plasma-protein metabolism, not elsewhere classified: Secondary | ICD-10-CM | POA: Diagnosis not present

## 2020-01-05 DIAGNOSIS — N186 End stage renal disease: Secondary | ICD-10-CM | POA: Diagnosis not present

## 2020-01-05 DIAGNOSIS — D631 Anemia in chronic kidney disease: Secondary | ICD-10-CM | POA: Diagnosis not present

## 2020-01-05 NOTE — Progress Notes (Signed)
Chief Complaint  Patient presents with  . Follow-up   HPI: [name] is a 69 y.o. male, who is here today for 6 months follow up.   He was last seen on 11/26/18. Sine his last visit he has followed with nephrologist. No new problems.   DM II: He is on Novolog sliding scale (16-17/day) and Tresiba 50 U daily. Concerned because FBS's have been elevated, 175-200's. He has not changed dietary habits. No hypoglycemic events.  Denies abdominal pain, nausea,vomiting, polydipsia,polyuria, or polyphagia.  Lab Results  Component Value Date   HGBA1C 7.2 (A) 08/26/2019   Peripheral neuropathy, he is on Amitriptyline 25 mg daily. On Oxycodone , follows with pain clinic.  HTN:He is on Carvedilol 6.25 mg bid, Hydralazine 50 mg tid,and Imdur 60 mg daily. Negative for severe/frequent headache, visual changes, chest pain, dyspnea, palpitation,focal weakness, or worsening edema.  + CAD (s/p stent placement),CHF,and ESRD.  Dialysis 3 times per week,still feeling well. Still producing urine but less.  HLD: He states that FLP has been checked by his cardiologist and it was "fine." He is on  Lab Results  Component Value Date   CHOL 98 08/10/2017   HDL 25.50 (L) 08/10/2017   LDLCALC 33 08/10/2017   LDLDIRECT 80.7 05/05/2010   TRIG 196.0 (H,) 08/10/2017   CHOLHDL 4 08/10/2017   He does not exercise regularly due to chronic back pain. He is following a healthful diet in general.  Review of Systems  Constitutional: Negative for activity change, appetite change, fatigue and fever.  HENT: Negative for mouth sores, nosebleeds and sore throat.   Respiratory: Negative for apnea, cough, shortness of breath and wheezing.   Cardiovascular: Negative for chest pain, palpitations and leg swelling.  Gastrointestinal: Negative for abdominal pain, nausea and vomiting.  Genitourinary: Negative for dysuria and hematuria.  Musculoskeletal: Positive for arthralgias and back pain. Negative  for gait problem and myalgias.  Neurological: Negative for syncope and facial asymmetry.  Psychiatric/Behavioral: Negative for confusion.  Rest of ROS, see pertinent positives sand negatives in HPI  Current Outpatient Medications on File Prior to Visit  Medication Sig Dispense Refill  . amitriptyline (ELAVIL) 25 MG tablet Take 25 mg by mouth at bedtime.     Marland Kitchen aspirin EC 81 MG tablet Take 81 mg by mouth daily.    [name] 1 GM 210 MG(Fe) tablet     . calcium acetate (PHOSLO) 667 MG capsule Take 1 capsule (667 mg total) by mouth 3 (three) times daily with meals. 90 capsule 0  . carvedilol (COREG) 6.25 MG tablet TAKE 1 TABLET BY MOUTH TWICE DAILY WITH A MEAL 180 tablet 0  . Cholecalciferol (VITAMIN D) 2000 units tablet Take 2,000 Units by mouth daily.    . colchicine 0.6 MG tablet TAKE 2 TABLETS BY MOUTH AS SOON AS GOUT SYMPTOMS START 30 tablet 0  . glucose blood test strip Use to test glucose 3 times daily. Dx: E 11.9 300 each 3  . hydrALAZINE (APRESOLINE) 50 MG tablet Take 1 tablet (50 mg total) by mouth 3 (three) times daily. 90 tablet 2  . insulin degludec (TRESIBA FLEXTOUCH) 100 UNIT/ML SOPN FlexTouch Pen INJECT 50 UNITS SUB-Q ONCE DAILY 45 mL 3  . isosorbide mononitrate (IMDUR) 60 MG 24 hr tablet Take 1 tablet by mouth once daily 90 tablet 0  . lidocaine-prilocaine (EMLA) cream APPLY SMALL AMOUNT TO ACCESS SITE (AVF) 1 TO 2 HOURS BEFORE DIALYSIS. COVER WITH OCCLUSIVE DRESSING (SARAN WRAP)  12  .  montelukast (SINGULAIR) 10 MG tablet Take 10 mg by mouth at bedtime.     . nitroGLYCERIN (NITROSTAT) 0.4 MG SL tablet Place 1 tablet (0.4 mg total) under the tongue every 5 (five) minutes as needed for chest pain. 30 tablet 12  . NOVOLOG FLEXPEN 100 UNIT/ML FlexPen INJECT 2 TO 25 UNITS SUBCUTANEOUSLY 3 TIMES A DAY WITH MEALS PER SLIDING SCALE 45 mL 3  . Oxycodone HCl 10 MG TABS Take 10 mg by mouth 4 (four) times daily as needed (for pain).     . simvastatin (ZOCOR) 40 MG tablet TAKE 1 TABLET BY  MOUTH AT BEDTIME 90 tablet 0  . tamsulosin (FLOMAX) 0.4 MG CAPS capsule Take 1 capsule (0.4 mg total) by mouth daily. 30 capsule 0   No current facility-administered medications on file prior to visit.   Past Medical History:  Diagnosis Date  . Acute renal failure superimposed on chronic kidney disease (HCC)    /ntoes 11/01/2016  . Anemia   . Arthritis    "back, knees" (11/01/2016)  . Asthma   . CAD (coronary artery disease) 12/31/07   [facility] stents- placed in the circumflex and LAD  . Chronic bronchitis (HCC)   . Hemodialysis status (HCC)   . History of blood transfusion 10/30/2016   "related to anemia"  . History of gout   . History of kidney stones   . Hyperlipidemia   . Hypertension   . NSTEMI (non-ST elevated myocardial infarction) (HCC) 10/30/2016  . OA (osteoarthritis)   . Obese   . On home oxygen therapy    "2L w/CPAP" (11/01/2016)  . OSA on CPAP   . Psoriatic arthritis (HCC)   . Type II diabetes mellitus (HCC)    No Known Allergies  Social History   Socioeconomic History  . Marital status: Divorced    Spouse name: Not on file  . Number of children: 3  . Years of education: 12  . Highest education level: High school graduate  Occupational History  . Not on file  Tobacco Use  . Smoking status: Never Smoker  . Smokeless tobacco: Never Used  Substance and Sexual Activity  . Alcohol use: Not Currently    Comment: 11/01/2016 "nothing in years"  . Drug use: No  . Sexual activity: Yes  Other Topics Concern  . Not on file  Social History Narrative   Divorced   1 son and 2 daughters   2 grandsons   Retired exterminator   Social Determinants of Health   Financial Resource Strain: Low Risk   . Difficulty of Paying Living Expenses: Not very hard  Food Insecurity: No Food Insecurity  . Worried About Running Out of Food in the Last Year: Never true  . Ran Out of Food in the Last Year: Never true  Transportation Needs:   . Lack of Transportation  (Medical):   . Lack of Transportation (Non-Medical):   Physical Activity: Inactive  . Days of Exercise per Week: 0 days  . Minutes of Exercise per Session: 0 min  Stress:   . Feeling of Stress :   Social Connections: Unknown  . Frequency of Communication with Friends and Family: More than three times a week  . Frequency of Social Gatherings with Friends and Family: More than three times a week  . Attends Religious Services: Not on file  . Active Member of Clubs or Organizations: Not on file  . Attends Club or Organization Meetings: Not on file  . Marital Status: Divorced     Vitals:   01/06/20 1053  BP: 130/60  Pulse: 77  Resp: 16  Temp: 97.8 F (36.6 C)  SpO2: 99%   Wt Readings from Last 3 Encounters:  01/06/20 290 lb (131.5 kg)  08/26/19 290 lb (131.5 kg)  08/06/19 286 lb 2.5 oz (129.8 kg)   Body mass index is 44.09 kg/m.  Physical Exam  Nursing note and vitals reviewed. Constitutional: He is oriented to person, place, and time. He appears well-developed. No distress.  HENT:  Head: Normocephalic and atraumatic.  Mouth/Throat: Oropharynx is clear and moist and mucous membranes are normal.  Eyes: Pupils are equal, round, and reactive to light. Conjunctivae are normal.  Cardiovascular: Normal rate and regular rhythm.  No murmur heard. Pulses:      Dorsalis pedis pulses are 2+ on the right side and 2+ on the left side.  Distant heart sounds.  Respiratory: Effort normal and breath sounds normal. No respiratory distress.  GI: Soft. He exhibits no mass. There is no hepatomegaly. There is no abdominal tenderness.  Musculoskeletal:        General: Edema (Trace pitting LE edema,bilateral.) present.  Lymphadenopathy:    He has no cervical adenopathy.  Neurological: He is alert and oriented to person, place, and time. He has normal strength. No cranial nerve deficit. Gait normal.  Skin: Skin is warm. No rash noted. No erythema.  Psychiatric: He has a normal mood and affect.    Well groomed, good eye contact.   ASSESSMENT AND PLAN:  Kent Osborne was seen today for 6 months follow-up.  Orders Placed This Encounter  Procedures  . Fructosamine  . Hepatic function panel  . POC HgB A1c   Lab Results  Component Value Date   HGBA1C 6.7 (A) 01/06/2020   Lab Results  Component Value Date   ALT 28 01/06/2020   AST 26 01/06/2020   ALKPHOS 75 01/06/2020   BILITOT 0.5 01/06/2020   1. Type II diabetes mellitus with circulatory disorder, uncontrolled HgA1C at goal. No changes in current management, Tresiba dose will be arranged according to fructosamine. Regular exercise as tolerated and healthy diet with avoidance of added sugar food intake is an important part of treatment and recommended. Annual eye examand foot care recommended. F/U in 5-6 months  - Continuous Blood Gluc Receiver ([place] M G5 RECEIVER KIT) DEVI; 1 kit by Does not apply route as directed.  Dispense: 1 Device; Refill: 0  2. Essential hypertension BP adequately controlled. No changes in current management. Continue low salt diet.  3. Diabetic peripheral neuropathy associated with type 2 diabetes mellitus ([place]) Good foot care discussed. Continue Amitriptyline 25 mg daily.  4. Hyperlipidemia, unspecified hyperlipidemia type Continue Simvastatin 40 mg daily. Reporting FLP done at his cardiologist's office.Will try to obtain copy of lab result.  5. Morbid obesity ([place]) Wt is stable. We discussed benefits of wt loss as well as adverse effects of obesity. Continue healthful diet and physical activity as tolerated.  Return in about 7 months (around 08/09/2020) for awv and f/u.  Cacie Gaskins G. [place], MD  [facility]. [place] office.  A few things to remember from today's visit:  No changes today. We will adjust insulin based on fructosamine level.  If you need refills please call your pharmacy. Do not use My Chart to request refills or for acute issues that need  immediate attention.    Please be sure medication list is accurate. If a new problem present, please set up appointment sooner than planned  today.

## 2020-01-06 ENCOUNTER — Encounter: Payer: Self-pay | Admitting: Family Medicine

## 2020-01-06 ENCOUNTER — Ambulatory Visit (INDEPENDENT_AMBULATORY_CARE_PROVIDER_SITE_OTHER): Payer: Medicare Other | Admitting: Family Medicine

## 2020-01-06 VITALS — BP 130/60 | HR 77 | Temp 97.8°F | Resp 16 | Ht 68.0 in | Wt 290.0 lb

## 2020-01-06 DIAGNOSIS — I10 Essential (primary) hypertension: Secondary | ICD-10-CM | POA: Diagnosis not present

## 2020-01-06 DIAGNOSIS — E1165 Type 2 diabetes mellitus with hyperglycemia: Secondary | ICD-10-CM | POA: Diagnosis not present

## 2020-01-06 DIAGNOSIS — D3002 Benign neoplasm of left kidney: Secondary | ICD-10-CM | POA: Diagnosis not present

## 2020-01-06 DIAGNOSIS — E1142 Type 2 diabetes mellitus with diabetic polyneuropathy: Secondary | ICD-10-CM

## 2020-01-06 DIAGNOSIS — E785 Hyperlipidemia, unspecified: Secondary | ICD-10-CM | POA: Diagnosis not present

## 2020-01-06 DIAGNOSIS — E1151 Type 2 diabetes mellitus with diabetic peripheral angiopathy without gangrene: Secondary | ICD-10-CM

## 2020-01-06 DIAGNOSIS — IMO0002 Reserved for concepts with insufficient information to code with codable children: Secondary | ICD-10-CM

## 2020-01-06 LAB — HEPATIC FUNCTION PANEL
ALT: 28 U/L (ref 0–53)
AST: 26 U/L (ref 0–37)
Albumin: 4.3 g/dL (ref 3.5–5.2)
Alkaline Phosphatase: 75 U/L (ref 39–117)
Bilirubin, Direct: 0.2 mg/dL (ref 0.0–0.3)
Total Bilirubin: 0.5 mg/dL (ref 0.2–1.2)
Total Protein: 7.2 g/dL (ref 6.0–8.3)

## 2020-01-06 LAB — POCT GLYCOSYLATED HEMOGLOBIN (HGB A1C): Hemoglobin A1C: 6.7 % — AB (ref 4.0–5.6)

## 2020-01-06 MED ORDER — DEXCOM G5 RECEIVER KIT DEVI: 1.0000 | 0 refills | Status: AC

## 2020-01-06 NOTE — Patient Instructions (Addendum)
A few things to remember from today's visit:    No changes today. We will adjust insulin based on fructosamine level.  If you need refills please call your pharmacy. Do not use My Chart to request refills or for acute issues that need immediate attention.    Please be sure medication list is accurate. If a new problem present, please set up appointment sooner than planned today.

## 2020-01-07 DIAGNOSIS — Z992 Dependence on renal dialysis: Secondary | ICD-10-CM | POA: Diagnosis not present

## 2020-01-07 DIAGNOSIS — D631 Anemia in chronic kidney disease: Secondary | ICD-10-CM | POA: Diagnosis not present

## 2020-01-07 DIAGNOSIS — E8809 Other disorders of plasma-protein metabolism, not elsewhere classified: Secondary | ICD-10-CM | POA: Diagnosis not present

## 2020-01-07 DIAGNOSIS — N2581 Secondary hyperparathyroidism of renal origin: Secondary | ICD-10-CM | POA: Diagnosis not present

## 2020-01-07 DIAGNOSIS — N186 End stage renal disease: Secondary | ICD-10-CM | POA: Diagnosis not present

## 2020-01-07 DIAGNOSIS — D500 Iron deficiency anemia secondary to blood loss (chronic): Secondary | ICD-10-CM | POA: Diagnosis not present

## 2020-01-09 DIAGNOSIS — D500 Iron deficiency anemia secondary to blood loss (chronic): Secondary | ICD-10-CM | POA: Diagnosis not present

## 2020-01-09 DIAGNOSIS — Z992 Dependence on renal dialysis: Secondary | ICD-10-CM | POA: Diagnosis not present

## 2020-01-09 DIAGNOSIS — E8809 Other disorders of plasma-protein metabolism, not elsewhere classified: Secondary | ICD-10-CM | POA: Diagnosis not present

## 2020-01-09 DIAGNOSIS — N2581 Secondary hyperparathyroidism of renal origin: Secondary | ICD-10-CM | POA: Diagnosis not present

## 2020-01-09 DIAGNOSIS — N186 End stage renal disease: Secondary | ICD-10-CM | POA: Diagnosis not present

## 2020-01-09 DIAGNOSIS — D631 Anemia in chronic kidney disease: Secondary | ICD-10-CM | POA: Diagnosis not present

## 2020-01-10 LAB — FRUCTOSAMINE: Fructosamine: 345 umol/L — ABNORMAL HIGH (ref 205–285)

## 2020-01-12 DIAGNOSIS — D500 Iron deficiency anemia secondary to blood loss (chronic): Secondary | ICD-10-CM | POA: Diagnosis not present

## 2020-01-12 DIAGNOSIS — N2581 Secondary hyperparathyroidism of renal origin: Secondary | ICD-10-CM | POA: Diagnosis not present

## 2020-01-12 DIAGNOSIS — N186 End stage renal disease: Secondary | ICD-10-CM | POA: Diagnosis not present

## 2020-01-12 DIAGNOSIS — Z992 Dependence on renal dialysis: Secondary | ICD-10-CM | POA: Diagnosis not present

## 2020-01-12 DIAGNOSIS — E8809 Other disorders of plasma-protein metabolism, not elsewhere classified: Secondary | ICD-10-CM | POA: Diagnosis not present

## 2020-01-12 DIAGNOSIS — D631 Anemia in chronic kidney disease: Secondary | ICD-10-CM | POA: Diagnosis not present

## 2020-01-14 DIAGNOSIS — D500 Iron deficiency anemia secondary to blood loss (chronic): Secondary | ICD-10-CM | POA: Diagnosis not present

## 2020-01-14 DIAGNOSIS — N2581 Secondary hyperparathyroidism of renal origin: Secondary | ICD-10-CM | POA: Diagnosis not present

## 2020-01-14 DIAGNOSIS — E8809 Other disorders of plasma-protein metabolism, not elsewhere classified: Secondary | ICD-10-CM | POA: Diagnosis not present

## 2020-01-14 DIAGNOSIS — N186 End stage renal disease: Secondary | ICD-10-CM | POA: Diagnosis not present

## 2020-01-14 DIAGNOSIS — D631 Anemia in chronic kidney disease: Secondary | ICD-10-CM | POA: Diagnosis not present

## 2020-01-14 DIAGNOSIS — Z992 Dependence on renal dialysis: Secondary | ICD-10-CM | POA: Diagnosis not present

## 2020-01-16 ENCOUNTER — Other Ambulatory Visit: Payer: Self-pay | Admitting: *Deleted

## 2020-01-16 DIAGNOSIS — E8809 Other disorders of plasma-protein metabolism, not elsewhere classified: Secondary | ICD-10-CM | POA: Diagnosis not present

## 2020-01-16 DIAGNOSIS — D631 Anemia in chronic kidney disease: Secondary | ICD-10-CM | POA: Diagnosis not present

## 2020-01-16 DIAGNOSIS — N186 End stage renal disease: Secondary | ICD-10-CM | POA: Diagnosis not present

## 2020-01-16 DIAGNOSIS — Z992 Dependence on renal dialysis: Secondary | ICD-10-CM | POA: Diagnosis not present

## 2020-01-16 DIAGNOSIS — D500 Iron deficiency anemia secondary to blood loss (chronic): Secondary | ICD-10-CM | POA: Diagnosis not present

## 2020-01-16 DIAGNOSIS — N2581 Secondary hyperparathyroidism of renal origin: Secondary | ICD-10-CM | POA: Diagnosis not present

## 2020-01-16 DIAGNOSIS — E1122 Type 2 diabetes mellitus with diabetic chronic kidney disease: Secondary | ICD-10-CM

## 2020-01-16 MED ORDER — TRESIBA FLEXTOUCH 100 UNIT/ML ~~LOC~~ SOPN: PEN_INJECTOR | SUBCUTANEOUS | 3 refills | Status: DC

## 2020-01-19 DIAGNOSIS — N186 End stage renal disease: Secondary | ICD-10-CM | POA: Diagnosis not present

## 2020-01-19 DIAGNOSIS — E8809 Other disorders of plasma-protein metabolism, not elsewhere classified: Secondary | ICD-10-CM | POA: Diagnosis not present

## 2020-01-19 DIAGNOSIS — D500 Iron deficiency anemia secondary to blood loss (chronic): Secondary | ICD-10-CM | POA: Diagnosis not present

## 2020-01-19 DIAGNOSIS — D631 Anemia in chronic kidney disease: Secondary | ICD-10-CM | POA: Diagnosis not present

## 2020-01-19 DIAGNOSIS — Z992 Dependence on renal dialysis: Secondary | ICD-10-CM | POA: Diagnosis not present

## 2020-01-19 DIAGNOSIS — N2581 Secondary hyperparathyroidism of renal origin: Secondary | ICD-10-CM | POA: Diagnosis not present

## 2020-01-21 DIAGNOSIS — N2581 Secondary hyperparathyroidism of renal origin: Secondary | ICD-10-CM | POA: Diagnosis not present

## 2020-01-21 DIAGNOSIS — N186 End stage renal disease: Secondary | ICD-10-CM | POA: Diagnosis not present

## 2020-01-21 DIAGNOSIS — D631 Anemia in chronic kidney disease: Secondary | ICD-10-CM | POA: Diagnosis not present

## 2020-01-21 DIAGNOSIS — Z992 Dependence on renal dialysis: Secondary | ICD-10-CM | POA: Diagnosis not present

## 2020-01-21 DIAGNOSIS — D6959 Other secondary thrombocytopenia: Secondary | ICD-10-CM | POA: Diagnosis not present

## 2020-01-21 DIAGNOSIS — D500 Iron deficiency anemia secondary to blood loss (chronic): Secondary | ICD-10-CM | POA: Diagnosis not present

## 2020-01-21 DIAGNOSIS — E8809 Other disorders of plasma-protein metabolism, not elsewhere classified: Secondary | ICD-10-CM | POA: Diagnosis not present

## 2020-01-23 DIAGNOSIS — D631 Anemia in chronic kidney disease: Secondary | ICD-10-CM | POA: Diagnosis not present

## 2020-01-23 DIAGNOSIS — Z992 Dependence on renal dialysis: Secondary | ICD-10-CM | POA: Diagnosis not present

## 2020-01-23 DIAGNOSIS — N2581 Secondary hyperparathyroidism of renal origin: Secondary | ICD-10-CM | POA: Diagnosis not present

## 2020-01-23 DIAGNOSIS — N186 End stage renal disease: Secondary | ICD-10-CM | POA: Diagnosis not present

## 2020-01-23 DIAGNOSIS — E8809 Other disorders of plasma-protein metabolism, not elsewhere classified: Secondary | ICD-10-CM | POA: Diagnosis not present

## 2020-01-23 DIAGNOSIS — D500 Iron deficiency anemia secondary to blood loss (chronic): Secondary | ICD-10-CM | POA: Diagnosis not present

## 2020-01-26 DIAGNOSIS — D631 Anemia in chronic kidney disease: Secondary | ICD-10-CM | POA: Diagnosis not present

## 2020-01-26 DIAGNOSIS — D500 Iron deficiency anemia secondary to blood loss (chronic): Secondary | ICD-10-CM | POA: Diagnosis not present

## 2020-01-26 DIAGNOSIS — N186 End stage renal disease: Secondary | ICD-10-CM | POA: Diagnosis not present

## 2020-01-26 DIAGNOSIS — N2581 Secondary hyperparathyroidism of renal origin: Secondary | ICD-10-CM | POA: Diagnosis not present

## 2020-01-26 DIAGNOSIS — Z992 Dependence on renal dialysis: Secondary | ICD-10-CM | POA: Diagnosis not present

## 2020-01-26 DIAGNOSIS — E8809 Other disorders of plasma-protein metabolism, not elsewhere classified: Secondary | ICD-10-CM | POA: Diagnosis not present

## 2020-01-27 DIAGNOSIS — E1140 Type 2 diabetes mellitus with diabetic neuropathy, unspecified: Secondary | ICD-10-CM | POA: Diagnosis not present

## 2020-01-27 DIAGNOSIS — Z992 Dependence on renal dialysis: Secondary | ICD-10-CM | POA: Diagnosis not present

## 2020-01-27 DIAGNOSIS — M79673 Pain in unspecified foot: Secondary | ICD-10-CM | POA: Diagnosis not present

## 2020-01-27 DIAGNOSIS — L600 Ingrowing nail: Secondary | ICD-10-CM | POA: Diagnosis not present

## 2020-01-27 DIAGNOSIS — M2010 Hallux valgus (acquired), unspecified foot: Secondary | ICD-10-CM | POA: Diagnosis not present

## 2020-01-28 DIAGNOSIS — D500 Iron deficiency anemia secondary to blood loss (chronic): Secondary | ICD-10-CM | POA: Diagnosis not present

## 2020-01-28 DIAGNOSIS — E8809 Other disorders of plasma-protein metabolism, not elsewhere classified: Secondary | ICD-10-CM | POA: Diagnosis not present

## 2020-01-28 DIAGNOSIS — N186 End stage renal disease: Secondary | ICD-10-CM | POA: Diagnosis not present

## 2020-01-28 DIAGNOSIS — Z992 Dependence on renal dialysis: Secondary | ICD-10-CM | POA: Diagnosis not present

## 2020-01-28 DIAGNOSIS — D631 Anemia in chronic kidney disease: Secondary | ICD-10-CM | POA: Diagnosis not present

## 2020-01-28 DIAGNOSIS — N2581 Secondary hyperparathyroidism of renal origin: Secondary | ICD-10-CM | POA: Diagnosis not present

## 2020-01-28 DIAGNOSIS — E1129 Type 2 diabetes mellitus with other diabetic kidney complication: Secondary | ICD-10-CM | POA: Diagnosis not present

## 2020-01-30 DIAGNOSIS — N186 End stage renal disease: Secondary | ICD-10-CM | POA: Diagnosis not present

## 2020-01-30 DIAGNOSIS — E8809 Other disorders of plasma-protein metabolism, not elsewhere classified: Secondary | ICD-10-CM | POA: Diagnosis not present

## 2020-01-30 DIAGNOSIS — D500 Iron deficiency anemia secondary to blood loss (chronic): Secondary | ICD-10-CM | POA: Diagnosis not present

## 2020-01-30 DIAGNOSIS — N2581 Secondary hyperparathyroidism of renal origin: Secondary | ICD-10-CM | POA: Diagnosis not present

## 2020-01-30 DIAGNOSIS — D631 Anemia in chronic kidney disease: Secondary | ICD-10-CM | POA: Diagnosis not present

## 2020-01-30 DIAGNOSIS — Z992 Dependence on renal dialysis: Secondary | ICD-10-CM | POA: Diagnosis not present

## 2020-02-02 DIAGNOSIS — Z992 Dependence on renal dialysis: Secondary | ICD-10-CM | POA: Diagnosis not present

## 2020-02-02 DIAGNOSIS — E8809 Other disorders of plasma-protein metabolism, not elsewhere classified: Secondary | ICD-10-CM | POA: Diagnosis not present

## 2020-02-02 DIAGNOSIS — D631 Anemia in chronic kidney disease: Secondary | ICD-10-CM | POA: Diagnosis not present

## 2020-02-02 DIAGNOSIS — N186 End stage renal disease: Secondary | ICD-10-CM | POA: Diagnosis not present

## 2020-02-02 DIAGNOSIS — D500 Iron deficiency anemia secondary to blood loss (chronic): Secondary | ICD-10-CM | POA: Diagnosis not present

## 2020-02-02 DIAGNOSIS — N2581 Secondary hyperparathyroidism of renal origin: Secondary | ICD-10-CM | POA: Diagnosis not present

## 2020-02-04 DIAGNOSIS — N186 End stage renal disease: Secondary | ICD-10-CM | POA: Diagnosis not present

## 2020-02-04 DIAGNOSIS — Z992 Dependence on renal dialysis: Secondary | ICD-10-CM | POA: Diagnosis not present

## 2020-02-04 DIAGNOSIS — D631 Anemia in chronic kidney disease: Secondary | ICD-10-CM | POA: Diagnosis not present

## 2020-02-04 DIAGNOSIS — E8809 Other disorders of plasma-protein metabolism, not elsewhere classified: Secondary | ICD-10-CM | POA: Diagnosis not present

## 2020-02-04 DIAGNOSIS — D500 Iron deficiency anemia secondary to blood loss (chronic): Secondary | ICD-10-CM | POA: Diagnosis not present

## 2020-02-04 DIAGNOSIS — N2581 Secondary hyperparathyroidism of renal origin: Secondary | ICD-10-CM | POA: Diagnosis not present

## 2020-02-06 DIAGNOSIS — R1031 Right lower quadrant pain: Secondary | ICD-10-CM | POA: Diagnosis not present

## 2020-02-06 DIAGNOSIS — K8020 Calculus of gallbladder without cholecystitis without obstruction: Secondary | ICD-10-CM | POA: Diagnosis not present

## 2020-02-06 DIAGNOSIS — N289 Disorder of kidney and ureter, unspecified: Secondary | ICD-10-CM | POA: Diagnosis not present

## 2020-02-06 DIAGNOSIS — K5900 Constipation, unspecified: Secondary | ICD-10-CM | POA: Diagnosis not present

## 2020-02-07 DIAGNOSIS — N186 End stage renal disease: Secondary | ICD-10-CM | POA: Diagnosis not present

## 2020-02-07 DIAGNOSIS — N2581 Secondary hyperparathyroidism of renal origin: Secondary | ICD-10-CM | POA: Diagnosis not present

## 2020-02-07 DIAGNOSIS — E8809 Other disorders of plasma-protein metabolism, not elsewhere classified: Secondary | ICD-10-CM | POA: Diagnosis not present

## 2020-02-07 DIAGNOSIS — D631 Anemia in chronic kidney disease: Secondary | ICD-10-CM | POA: Diagnosis not present

## 2020-02-07 DIAGNOSIS — D500 Iron deficiency anemia secondary to blood loss (chronic): Secondary | ICD-10-CM | POA: Diagnosis not present

## 2020-02-07 DIAGNOSIS — Z992 Dependence on renal dialysis: Secondary | ICD-10-CM | POA: Diagnosis not present

## 2020-02-09 DIAGNOSIS — N2581 Secondary hyperparathyroidism of renal origin: Secondary | ICD-10-CM | POA: Diagnosis not present

## 2020-02-09 DIAGNOSIS — Z992 Dependence on renal dialysis: Secondary | ICD-10-CM | POA: Diagnosis not present

## 2020-02-09 DIAGNOSIS — D631 Anemia in chronic kidney disease: Secondary | ICD-10-CM | POA: Diagnosis not present

## 2020-02-09 DIAGNOSIS — E8809 Other disorders of plasma-protein metabolism, not elsewhere classified: Secondary | ICD-10-CM | POA: Diagnosis not present

## 2020-02-09 DIAGNOSIS — N186 End stage renal disease: Secondary | ICD-10-CM | POA: Diagnosis not present

## 2020-02-09 DIAGNOSIS — D500 Iron deficiency anemia secondary to blood loss (chronic): Secondary | ICD-10-CM | POA: Diagnosis not present

## 2020-02-11 DIAGNOSIS — N2581 Secondary hyperparathyroidism of renal origin: Secondary | ICD-10-CM | POA: Diagnosis not present

## 2020-02-11 DIAGNOSIS — N186 End stage renal disease: Secondary | ICD-10-CM | POA: Diagnosis not present

## 2020-02-11 DIAGNOSIS — D500 Iron deficiency anemia secondary to blood loss (chronic): Secondary | ICD-10-CM | POA: Diagnosis not present

## 2020-02-11 DIAGNOSIS — D631 Anemia in chronic kidney disease: Secondary | ICD-10-CM | POA: Diagnosis not present

## 2020-02-11 DIAGNOSIS — E8809 Other disorders of plasma-protein metabolism, not elsewhere classified: Secondary | ICD-10-CM | POA: Diagnosis not present

## 2020-02-11 DIAGNOSIS — Z992 Dependence on renal dialysis: Secondary | ICD-10-CM | POA: Diagnosis not present

## 2020-02-13 DIAGNOSIS — E8809 Other disorders of plasma-protein metabolism, not elsewhere classified: Secondary | ICD-10-CM | POA: Diagnosis not present

## 2020-02-13 DIAGNOSIS — Z992 Dependence on renal dialysis: Secondary | ICD-10-CM | POA: Diagnosis not present

## 2020-02-13 DIAGNOSIS — D500 Iron deficiency anemia secondary to blood loss (chronic): Secondary | ICD-10-CM | POA: Diagnosis not present

## 2020-02-13 DIAGNOSIS — D631 Anemia in chronic kidney disease: Secondary | ICD-10-CM | POA: Diagnosis not present

## 2020-02-13 DIAGNOSIS — N2581 Secondary hyperparathyroidism of renal origin: Secondary | ICD-10-CM | POA: Diagnosis not present

## 2020-02-13 DIAGNOSIS — N186 End stage renal disease: Secondary | ICD-10-CM | POA: Diagnosis not present

## 2020-02-16 DIAGNOSIS — Z992 Dependence on renal dialysis: Secondary | ICD-10-CM | POA: Diagnosis not present

## 2020-02-16 DIAGNOSIS — D500 Iron deficiency anemia secondary to blood loss (chronic): Secondary | ICD-10-CM | POA: Diagnosis not present

## 2020-02-16 DIAGNOSIS — E8809 Other disorders of plasma-protein metabolism, not elsewhere classified: Secondary | ICD-10-CM | POA: Diagnosis not present

## 2020-02-16 DIAGNOSIS — N186 End stage renal disease: Secondary | ICD-10-CM | POA: Diagnosis not present

## 2020-02-16 DIAGNOSIS — N2581 Secondary hyperparathyroidism of renal origin: Secondary | ICD-10-CM | POA: Diagnosis not present

## 2020-02-16 DIAGNOSIS — D631 Anemia in chronic kidney disease: Secondary | ICD-10-CM | POA: Diagnosis not present

## 2020-02-18 DIAGNOSIS — E8809 Other disorders of plasma-protein metabolism, not elsewhere classified: Secondary | ICD-10-CM | POA: Diagnosis not present

## 2020-02-18 DIAGNOSIS — N2581 Secondary hyperparathyroidism of renal origin: Secondary | ICD-10-CM | POA: Diagnosis not present

## 2020-02-18 DIAGNOSIS — N186 End stage renal disease: Secondary | ICD-10-CM | POA: Diagnosis not present

## 2020-02-18 DIAGNOSIS — Z992 Dependence on renal dialysis: Secondary | ICD-10-CM | POA: Diagnosis not present

## 2020-02-18 DIAGNOSIS — D500 Iron deficiency anemia secondary to blood loss (chronic): Secondary | ICD-10-CM | POA: Diagnosis not present

## 2020-02-18 DIAGNOSIS — D631 Anemia in chronic kidney disease: Secondary | ICD-10-CM | POA: Diagnosis not present

## 2020-02-20 DIAGNOSIS — Z992 Dependence on renal dialysis: Secondary | ICD-10-CM | POA: Diagnosis not present

## 2020-02-20 DIAGNOSIS — D631 Anemia in chronic kidney disease: Secondary | ICD-10-CM | POA: Diagnosis not present

## 2020-02-20 DIAGNOSIS — N186 End stage renal disease: Secondary | ICD-10-CM | POA: Diagnosis not present

## 2020-02-20 DIAGNOSIS — N2581 Secondary hyperparathyroidism of renal origin: Secondary | ICD-10-CM | POA: Diagnosis not present

## 2020-02-20 DIAGNOSIS — E8809 Other disorders of plasma-protein metabolism, not elsewhere classified: Secondary | ICD-10-CM | POA: Diagnosis not present

## 2020-02-20 DIAGNOSIS — D500 Iron deficiency anemia secondary to blood loss (chronic): Secondary | ICD-10-CM | POA: Diagnosis not present

## 2020-02-23 DIAGNOSIS — Z992 Dependence on renal dialysis: Secondary | ICD-10-CM | POA: Diagnosis not present

## 2020-02-23 DIAGNOSIS — N2581 Secondary hyperparathyroidism of renal origin: Secondary | ICD-10-CM | POA: Diagnosis not present

## 2020-02-23 DIAGNOSIS — E8809 Other disorders of plasma-protein metabolism, not elsewhere classified: Secondary | ICD-10-CM | POA: Diagnosis not present

## 2020-02-23 DIAGNOSIS — D500 Iron deficiency anemia secondary to blood loss (chronic): Secondary | ICD-10-CM | POA: Diagnosis not present

## 2020-02-23 DIAGNOSIS — D631 Anemia in chronic kidney disease: Secondary | ICD-10-CM | POA: Diagnosis not present

## 2020-02-23 DIAGNOSIS — N186 End stage renal disease: Secondary | ICD-10-CM | POA: Diagnosis not present

## 2020-02-25 DIAGNOSIS — E8809 Other disorders of plasma-protein metabolism, not elsewhere classified: Secondary | ICD-10-CM | POA: Diagnosis not present

## 2020-02-25 DIAGNOSIS — D500 Iron deficiency anemia secondary to blood loss (chronic): Secondary | ICD-10-CM | POA: Diagnosis not present

## 2020-02-25 DIAGNOSIS — N2581 Secondary hyperparathyroidism of renal origin: Secondary | ICD-10-CM | POA: Diagnosis not present

## 2020-02-25 DIAGNOSIS — D631 Anemia in chronic kidney disease: Secondary | ICD-10-CM | POA: Diagnosis not present

## 2020-02-25 DIAGNOSIS — Z992 Dependence on renal dialysis: Secondary | ICD-10-CM | POA: Diagnosis not present

## 2020-02-25 DIAGNOSIS — N186 End stage renal disease: Secondary | ICD-10-CM | POA: Diagnosis not present

## 2020-02-27 DIAGNOSIS — N186 End stage renal disease: Secondary | ICD-10-CM | POA: Diagnosis not present

## 2020-02-27 DIAGNOSIS — E8809 Other disorders of plasma-protein metabolism, not elsewhere classified: Secondary | ICD-10-CM | POA: Diagnosis not present

## 2020-02-27 DIAGNOSIS — D631 Anemia in chronic kidney disease: Secondary | ICD-10-CM | POA: Diagnosis not present

## 2020-02-27 DIAGNOSIS — N2581 Secondary hyperparathyroidism of renal origin: Secondary | ICD-10-CM | POA: Diagnosis not present

## 2020-02-27 DIAGNOSIS — Z992 Dependence on renal dialysis: Secondary | ICD-10-CM | POA: Diagnosis not present

## 2020-02-27 DIAGNOSIS — D500 Iron deficiency anemia secondary to blood loss (chronic): Secondary | ICD-10-CM | POA: Diagnosis not present

## 2020-03-01 DIAGNOSIS — D631 Anemia in chronic kidney disease: Secondary | ICD-10-CM | POA: Diagnosis not present

## 2020-03-01 DIAGNOSIS — N2581 Secondary hyperparathyroidism of renal origin: Secondary | ICD-10-CM | POA: Diagnosis not present

## 2020-03-01 DIAGNOSIS — D500 Iron deficiency anemia secondary to blood loss (chronic): Secondary | ICD-10-CM | POA: Diagnosis not present

## 2020-03-01 DIAGNOSIS — N186 End stage renal disease: Secondary | ICD-10-CM | POA: Diagnosis not present

## 2020-03-01 DIAGNOSIS — E8809 Other disorders of plasma-protein metabolism, not elsewhere classified: Secondary | ICD-10-CM | POA: Diagnosis not present

## 2020-03-01 DIAGNOSIS — Z992 Dependence on renal dialysis: Secondary | ICD-10-CM | POA: Diagnosis not present

## 2020-03-02 ENCOUNTER — Other Ambulatory Visit: Payer: Self-pay | Admitting: Family Medicine

## 2020-03-02 DIAGNOSIS — E785 Hyperlipidemia, unspecified: Secondary | ICD-10-CM

## 2020-03-03 DIAGNOSIS — N186 End stage renal disease: Secondary | ICD-10-CM | POA: Diagnosis not present

## 2020-03-03 DIAGNOSIS — Z992 Dependence on renal dialysis: Secondary | ICD-10-CM | POA: Diagnosis not present

## 2020-03-03 DIAGNOSIS — D500 Iron deficiency anemia secondary to blood loss (chronic): Secondary | ICD-10-CM | POA: Diagnosis not present

## 2020-03-03 DIAGNOSIS — E8809 Other disorders of plasma-protein metabolism, not elsewhere classified: Secondary | ICD-10-CM | POA: Diagnosis not present

## 2020-03-03 DIAGNOSIS — N2581 Secondary hyperparathyroidism of renal origin: Secondary | ICD-10-CM | POA: Diagnosis not present

## 2020-03-03 DIAGNOSIS — D631 Anemia in chronic kidney disease: Secondary | ICD-10-CM | POA: Diagnosis not present

## 2020-03-05 DIAGNOSIS — Z992 Dependence on renal dialysis: Secondary | ICD-10-CM | POA: Diagnosis not present

## 2020-03-05 DIAGNOSIS — D500 Iron deficiency anemia secondary to blood loss (chronic): Secondary | ICD-10-CM | POA: Diagnosis not present

## 2020-03-05 DIAGNOSIS — D631 Anemia in chronic kidney disease: Secondary | ICD-10-CM | POA: Diagnosis not present

## 2020-03-05 DIAGNOSIS — E8809 Other disorders of plasma-protein metabolism, not elsewhere classified: Secondary | ICD-10-CM | POA: Diagnosis not present

## 2020-03-05 DIAGNOSIS — N186 End stage renal disease: Secondary | ICD-10-CM | POA: Diagnosis not present

## 2020-03-05 DIAGNOSIS — N2581 Secondary hyperparathyroidism of renal origin: Secondary | ICD-10-CM | POA: Diagnosis not present

## 2020-03-08 DIAGNOSIS — D631 Anemia in chronic kidney disease: Secondary | ICD-10-CM | POA: Diagnosis not present

## 2020-03-08 DIAGNOSIS — Z992 Dependence on renal dialysis: Secondary | ICD-10-CM | POA: Diagnosis not present

## 2020-03-08 DIAGNOSIS — N186 End stage renal disease: Secondary | ICD-10-CM | POA: Diagnosis not present

## 2020-03-08 DIAGNOSIS — D500 Iron deficiency anemia secondary to blood loss (chronic): Secondary | ICD-10-CM | POA: Diagnosis not present

## 2020-03-08 DIAGNOSIS — N2581 Secondary hyperparathyroidism of renal origin: Secondary | ICD-10-CM | POA: Diagnosis not present

## 2020-03-08 DIAGNOSIS — E8809 Other disorders of plasma-protein metabolism, not elsewhere classified: Secondary | ICD-10-CM | POA: Diagnosis not present

## 2020-03-10 DIAGNOSIS — N186 End stage renal disease: Secondary | ICD-10-CM | POA: Diagnosis not present

## 2020-03-10 DIAGNOSIS — D500 Iron deficiency anemia secondary to blood loss (chronic): Secondary | ICD-10-CM | POA: Diagnosis not present

## 2020-03-10 DIAGNOSIS — E8809 Other disorders of plasma-protein metabolism, not elsewhere classified: Secondary | ICD-10-CM | POA: Diagnosis not present

## 2020-03-10 DIAGNOSIS — N2581 Secondary hyperparathyroidism of renal origin: Secondary | ICD-10-CM | POA: Diagnosis not present

## 2020-03-10 DIAGNOSIS — Z992 Dependence on renal dialysis: Secondary | ICD-10-CM | POA: Diagnosis not present

## 2020-03-10 DIAGNOSIS — D631 Anemia in chronic kidney disease: Secondary | ICD-10-CM | POA: Diagnosis not present

## 2020-03-12 DIAGNOSIS — D500 Iron deficiency anemia secondary to blood loss (chronic): Secondary | ICD-10-CM | POA: Diagnosis not present

## 2020-03-12 DIAGNOSIS — N186 End stage renal disease: Secondary | ICD-10-CM | POA: Diagnosis not present

## 2020-03-12 DIAGNOSIS — E8809 Other disorders of plasma-protein metabolism, not elsewhere classified: Secondary | ICD-10-CM | POA: Diagnosis not present

## 2020-03-12 DIAGNOSIS — N2581 Secondary hyperparathyroidism of renal origin: Secondary | ICD-10-CM | POA: Diagnosis not present

## 2020-03-12 DIAGNOSIS — Z992 Dependence on renal dialysis: Secondary | ICD-10-CM | POA: Diagnosis not present

## 2020-03-12 DIAGNOSIS — D631 Anemia in chronic kidney disease: Secondary | ICD-10-CM | POA: Diagnosis not present

## 2020-03-15 DIAGNOSIS — Z992 Dependence on renal dialysis: Secondary | ICD-10-CM | POA: Diagnosis not present

## 2020-03-15 DIAGNOSIS — D631 Anemia in chronic kidney disease: Secondary | ICD-10-CM | POA: Diagnosis not present

## 2020-03-15 DIAGNOSIS — E8809 Other disorders of plasma-protein metabolism, not elsewhere classified: Secondary | ICD-10-CM | POA: Diagnosis not present

## 2020-03-15 DIAGNOSIS — N2581 Secondary hyperparathyroidism of renal origin: Secondary | ICD-10-CM | POA: Diagnosis not present

## 2020-03-15 DIAGNOSIS — N186 End stage renal disease: Secondary | ICD-10-CM | POA: Diagnosis not present

## 2020-03-15 DIAGNOSIS — D500 Iron deficiency anemia secondary to blood loss (chronic): Secondary | ICD-10-CM | POA: Diagnosis not present

## 2020-03-17 DIAGNOSIS — Z992 Dependence on renal dialysis: Secondary | ICD-10-CM | POA: Diagnosis not present

## 2020-03-17 DIAGNOSIS — D500 Iron deficiency anemia secondary to blood loss (chronic): Secondary | ICD-10-CM | POA: Diagnosis not present

## 2020-03-17 DIAGNOSIS — N2581 Secondary hyperparathyroidism of renal origin: Secondary | ICD-10-CM | POA: Diagnosis not present

## 2020-03-17 DIAGNOSIS — D631 Anemia in chronic kidney disease: Secondary | ICD-10-CM | POA: Diagnosis not present

## 2020-03-17 DIAGNOSIS — N186 End stage renal disease: Secondary | ICD-10-CM | POA: Diagnosis not present

## 2020-03-17 DIAGNOSIS — E8809 Other disorders of plasma-protein metabolism, not elsewhere classified: Secondary | ICD-10-CM | POA: Diagnosis not present

## 2020-03-19 DIAGNOSIS — N186 End stage renal disease: Secondary | ICD-10-CM | POA: Diagnosis not present

## 2020-03-19 DIAGNOSIS — D631 Anemia in chronic kidney disease: Secondary | ICD-10-CM | POA: Diagnosis not present

## 2020-03-19 DIAGNOSIS — E8809 Other disorders of plasma-protein metabolism, not elsewhere classified: Secondary | ICD-10-CM | POA: Diagnosis not present

## 2020-03-19 DIAGNOSIS — Z992 Dependence on renal dialysis: Secondary | ICD-10-CM | POA: Diagnosis not present

## 2020-03-19 DIAGNOSIS — D500 Iron deficiency anemia secondary to blood loss (chronic): Secondary | ICD-10-CM | POA: Diagnosis not present

## 2020-03-19 DIAGNOSIS — N2581 Secondary hyperparathyroidism of renal origin: Secondary | ICD-10-CM | POA: Diagnosis not present

## 2020-03-20 DIAGNOSIS — N186 End stage renal disease: Secondary | ICD-10-CM | POA: Diagnosis not present

## 2020-03-20 DIAGNOSIS — Z992 Dependence on renal dialysis: Secondary | ICD-10-CM | POA: Diagnosis not present

## 2020-03-22 DIAGNOSIS — N186 End stage renal disease: Secondary | ICD-10-CM | POA: Diagnosis not present

## 2020-03-22 DIAGNOSIS — E8809 Other disorders of plasma-protein metabolism, not elsewhere classified: Secondary | ICD-10-CM | POA: Diagnosis not present

## 2020-03-22 DIAGNOSIS — Z992 Dependence on renal dialysis: Secondary | ICD-10-CM | POA: Diagnosis not present

## 2020-03-22 DIAGNOSIS — N2581 Secondary hyperparathyroidism of renal origin: Secondary | ICD-10-CM | POA: Diagnosis not present

## 2020-03-22 DIAGNOSIS — D631 Anemia in chronic kidney disease: Secondary | ICD-10-CM | POA: Diagnosis not present

## 2020-03-22 DIAGNOSIS — D500 Iron deficiency anemia secondary to blood loss (chronic): Secondary | ICD-10-CM | POA: Diagnosis not present

## 2020-03-24 DIAGNOSIS — Z992 Dependence on renal dialysis: Secondary | ICD-10-CM | POA: Diagnosis not present

## 2020-03-24 DIAGNOSIS — E8809 Other disorders of plasma-protein metabolism, not elsewhere classified: Secondary | ICD-10-CM | POA: Diagnosis not present

## 2020-03-24 DIAGNOSIS — N186 End stage renal disease: Secondary | ICD-10-CM | POA: Diagnosis not present

## 2020-03-24 DIAGNOSIS — N2581 Secondary hyperparathyroidism of renal origin: Secondary | ICD-10-CM | POA: Diagnosis not present

## 2020-03-24 DIAGNOSIS — D500 Iron deficiency anemia secondary to blood loss (chronic): Secondary | ICD-10-CM | POA: Diagnosis not present

## 2020-03-24 DIAGNOSIS — D631 Anemia in chronic kidney disease: Secondary | ICD-10-CM | POA: Diagnosis not present

## 2020-03-26 DIAGNOSIS — N186 End stage renal disease: Secondary | ICD-10-CM | POA: Diagnosis not present

## 2020-03-26 DIAGNOSIS — D631 Anemia in chronic kidney disease: Secondary | ICD-10-CM | POA: Diagnosis not present

## 2020-03-26 DIAGNOSIS — D500 Iron deficiency anemia secondary to blood loss (chronic): Secondary | ICD-10-CM | POA: Diagnosis not present

## 2020-03-26 DIAGNOSIS — Z992 Dependence on renal dialysis: Secondary | ICD-10-CM | POA: Diagnosis not present

## 2020-03-26 DIAGNOSIS — N2581 Secondary hyperparathyroidism of renal origin: Secondary | ICD-10-CM | POA: Diagnosis not present

## 2020-03-26 DIAGNOSIS — E8809 Other disorders of plasma-protein metabolism, not elsewhere classified: Secondary | ICD-10-CM | POA: Diagnosis not present

## 2020-03-29 DIAGNOSIS — N2581 Secondary hyperparathyroidism of renal origin: Secondary | ICD-10-CM | POA: Diagnosis not present

## 2020-03-29 DIAGNOSIS — Z992 Dependence on renal dialysis: Secondary | ICD-10-CM | POA: Diagnosis not present

## 2020-03-29 DIAGNOSIS — D631 Anemia in chronic kidney disease: Secondary | ICD-10-CM | POA: Diagnosis not present

## 2020-03-29 DIAGNOSIS — D500 Iron deficiency anemia secondary to blood loss (chronic): Secondary | ICD-10-CM | POA: Diagnosis not present

## 2020-03-29 DIAGNOSIS — N186 End stage renal disease: Secondary | ICD-10-CM | POA: Diagnosis not present

## 2020-03-29 DIAGNOSIS — E8809 Other disorders of plasma-protein metabolism, not elsewhere classified: Secondary | ICD-10-CM | POA: Diagnosis not present

## 2020-03-31 DIAGNOSIS — N186 End stage renal disease: Secondary | ICD-10-CM | POA: Diagnosis not present

## 2020-03-31 DIAGNOSIS — E8809 Other disorders of plasma-protein metabolism, not elsewhere classified: Secondary | ICD-10-CM | POA: Diagnosis not present

## 2020-03-31 DIAGNOSIS — N2581 Secondary hyperparathyroidism of renal origin: Secondary | ICD-10-CM | POA: Diagnosis not present

## 2020-03-31 DIAGNOSIS — D500 Iron deficiency anemia secondary to blood loss (chronic): Secondary | ICD-10-CM | POA: Diagnosis not present

## 2020-03-31 DIAGNOSIS — D631 Anemia in chronic kidney disease: Secondary | ICD-10-CM | POA: Diagnosis not present

## 2020-03-31 DIAGNOSIS — Z992 Dependence on renal dialysis: Secondary | ICD-10-CM | POA: Diagnosis not present

## 2020-04-02 DIAGNOSIS — D500 Iron deficiency anemia secondary to blood loss (chronic): Secondary | ICD-10-CM | POA: Diagnosis not present

## 2020-04-02 DIAGNOSIS — N186 End stage renal disease: Secondary | ICD-10-CM | POA: Diagnosis not present

## 2020-04-02 DIAGNOSIS — Z992 Dependence on renal dialysis: Secondary | ICD-10-CM | POA: Diagnosis not present

## 2020-04-02 DIAGNOSIS — D631 Anemia in chronic kidney disease: Secondary | ICD-10-CM | POA: Diagnosis not present

## 2020-04-02 DIAGNOSIS — N2581 Secondary hyperparathyroidism of renal origin: Secondary | ICD-10-CM | POA: Diagnosis not present

## 2020-04-02 DIAGNOSIS — E8809 Other disorders of plasma-protein metabolism, not elsewhere classified: Secondary | ICD-10-CM | POA: Diagnosis not present

## 2020-04-05 DIAGNOSIS — Z992 Dependence on renal dialysis: Secondary | ICD-10-CM | POA: Diagnosis not present

## 2020-04-05 DIAGNOSIS — D500 Iron deficiency anemia secondary to blood loss (chronic): Secondary | ICD-10-CM | POA: Diagnosis not present

## 2020-04-05 DIAGNOSIS — N2581 Secondary hyperparathyroidism of renal origin: Secondary | ICD-10-CM | POA: Diagnosis not present

## 2020-04-05 DIAGNOSIS — N186 End stage renal disease: Secondary | ICD-10-CM | POA: Diagnosis not present

## 2020-04-05 DIAGNOSIS — D631 Anemia in chronic kidney disease: Secondary | ICD-10-CM | POA: Diagnosis not present

## 2020-04-05 DIAGNOSIS — E8809 Other disorders of plasma-protein metabolism, not elsewhere classified: Secondary | ICD-10-CM | POA: Diagnosis not present

## 2020-04-06 ENCOUNTER — Other Ambulatory Visit: Payer: Self-pay | Admitting: Family Medicine

## 2020-04-07 DIAGNOSIS — Z992 Dependence on renal dialysis: Secondary | ICD-10-CM | POA: Diagnosis not present

## 2020-04-07 DIAGNOSIS — E8809 Other disorders of plasma-protein metabolism, not elsewhere classified: Secondary | ICD-10-CM | POA: Diagnosis not present

## 2020-04-07 DIAGNOSIS — N186 End stage renal disease: Secondary | ICD-10-CM | POA: Diagnosis not present

## 2020-04-07 DIAGNOSIS — N2581 Secondary hyperparathyroidism of renal origin: Secondary | ICD-10-CM | POA: Diagnosis not present

## 2020-04-07 DIAGNOSIS — D631 Anemia in chronic kidney disease: Secondary | ICD-10-CM | POA: Diagnosis not present

## 2020-04-07 DIAGNOSIS — D500 Iron deficiency anemia secondary to blood loss (chronic): Secondary | ICD-10-CM | POA: Diagnosis not present

## 2020-04-09 DIAGNOSIS — D500 Iron deficiency anemia secondary to blood loss (chronic): Secondary | ICD-10-CM | POA: Diagnosis not present

## 2020-04-09 DIAGNOSIS — N186 End stage renal disease: Secondary | ICD-10-CM | POA: Diagnosis not present

## 2020-04-09 DIAGNOSIS — D631 Anemia in chronic kidney disease: Secondary | ICD-10-CM | POA: Diagnosis not present

## 2020-04-09 DIAGNOSIS — N2581 Secondary hyperparathyroidism of renal origin: Secondary | ICD-10-CM | POA: Diagnosis not present

## 2020-04-09 DIAGNOSIS — E8809 Other disorders of plasma-protein metabolism, not elsewhere classified: Secondary | ICD-10-CM | POA: Diagnosis not present

## 2020-04-09 DIAGNOSIS — Z992 Dependence on renal dialysis: Secondary | ICD-10-CM | POA: Diagnosis not present

## 2020-04-12 DIAGNOSIS — D500 Iron deficiency anemia secondary to blood loss (chronic): Secondary | ICD-10-CM | POA: Diagnosis not present

## 2020-04-12 DIAGNOSIS — E8809 Other disorders of plasma-protein metabolism, not elsewhere classified: Secondary | ICD-10-CM | POA: Diagnosis not present

## 2020-04-12 DIAGNOSIS — N2581 Secondary hyperparathyroidism of renal origin: Secondary | ICD-10-CM | POA: Diagnosis not present

## 2020-04-12 DIAGNOSIS — D631 Anemia in chronic kidney disease: Secondary | ICD-10-CM | POA: Diagnosis not present

## 2020-04-12 DIAGNOSIS — Z992 Dependence on renal dialysis: Secondary | ICD-10-CM | POA: Diagnosis not present

## 2020-04-12 DIAGNOSIS — N186 End stage renal disease: Secondary | ICD-10-CM | POA: Diagnosis not present

## 2020-04-14 DIAGNOSIS — N186 End stage renal disease: Secondary | ICD-10-CM | POA: Diagnosis not present

## 2020-04-14 DIAGNOSIS — D500 Iron deficiency anemia secondary to blood loss (chronic): Secondary | ICD-10-CM | POA: Diagnosis not present

## 2020-04-14 DIAGNOSIS — Z992 Dependence on renal dialysis: Secondary | ICD-10-CM | POA: Diagnosis not present

## 2020-04-14 DIAGNOSIS — E8809 Other disorders of plasma-protein metabolism, not elsewhere classified: Secondary | ICD-10-CM | POA: Diagnosis not present

## 2020-04-14 DIAGNOSIS — D631 Anemia in chronic kidney disease: Secondary | ICD-10-CM | POA: Diagnosis not present

## 2020-04-14 DIAGNOSIS — N2581 Secondary hyperparathyroidism of renal origin: Secondary | ICD-10-CM | POA: Diagnosis not present

## 2020-04-16 DIAGNOSIS — N186 End stage renal disease: Secondary | ICD-10-CM | POA: Diagnosis not present

## 2020-04-16 DIAGNOSIS — N2581 Secondary hyperparathyroidism of renal origin: Secondary | ICD-10-CM | POA: Diagnosis not present

## 2020-04-16 DIAGNOSIS — E8809 Other disorders of plasma-protein metabolism, not elsewhere classified: Secondary | ICD-10-CM | POA: Diagnosis not present

## 2020-04-16 DIAGNOSIS — Z992 Dependence on renal dialysis: Secondary | ICD-10-CM | POA: Diagnosis not present

## 2020-04-16 DIAGNOSIS — D500 Iron deficiency anemia secondary to blood loss (chronic): Secondary | ICD-10-CM | POA: Diagnosis not present

## 2020-04-16 DIAGNOSIS — D631 Anemia in chronic kidney disease: Secondary | ICD-10-CM | POA: Diagnosis not present

## 2020-04-19 DIAGNOSIS — N2581 Secondary hyperparathyroidism of renal origin: Secondary | ICD-10-CM | POA: Diagnosis not present

## 2020-04-19 DIAGNOSIS — D500 Iron deficiency anemia secondary to blood loss (chronic): Secondary | ICD-10-CM | POA: Diagnosis not present

## 2020-04-19 DIAGNOSIS — Z992 Dependence on renal dialysis: Secondary | ICD-10-CM | POA: Diagnosis not present

## 2020-04-19 DIAGNOSIS — E8809 Other disorders of plasma-protein metabolism, not elsewhere classified: Secondary | ICD-10-CM | POA: Diagnosis not present

## 2020-04-19 DIAGNOSIS — N186 End stage renal disease: Secondary | ICD-10-CM | POA: Diagnosis not present

## 2020-04-19 DIAGNOSIS — D631 Anemia in chronic kidney disease: Secondary | ICD-10-CM | POA: Diagnosis not present

## 2020-04-20 DIAGNOSIS — N186 End stage renal disease: Secondary | ICD-10-CM | POA: Diagnosis not present

## 2020-04-20 DIAGNOSIS — Z992 Dependence on renal dialysis: Secondary | ICD-10-CM | POA: Diagnosis not present

## 2020-04-21 DIAGNOSIS — D6959 Other secondary thrombocytopenia: Secondary | ICD-10-CM | POA: Diagnosis not present

## 2020-04-21 DIAGNOSIS — D500 Iron deficiency anemia secondary to blood loss (chronic): Secondary | ICD-10-CM | POA: Diagnosis not present

## 2020-04-21 DIAGNOSIS — D631 Anemia in chronic kidney disease: Secondary | ICD-10-CM | POA: Diagnosis not present

## 2020-04-21 DIAGNOSIS — N2581 Secondary hyperparathyroidism of renal origin: Secondary | ICD-10-CM | POA: Diagnosis not present

## 2020-04-21 DIAGNOSIS — N186 End stage renal disease: Secondary | ICD-10-CM | POA: Diagnosis not present

## 2020-04-21 DIAGNOSIS — E8809 Other disorders of plasma-protein metabolism, not elsewhere classified: Secondary | ICD-10-CM | POA: Diagnosis not present

## 2020-04-21 DIAGNOSIS — Z992 Dependence on renal dialysis: Secondary | ICD-10-CM | POA: Diagnosis not present

## 2020-04-23 DIAGNOSIS — D500 Iron deficiency anemia secondary to blood loss (chronic): Secondary | ICD-10-CM | POA: Diagnosis not present

## 2020-04-23 DIAGNOSIS — E8809 Other disorders of plasma-protein metabolism, not elsewhere classified: Secondary | ICD-10-CM | POA: Diagnosis not present

## 2020-04-23 DIAGNOSIS — Z992 Dependence on renal dialysis: Secondary | ICD-10-CM | POA: Diagnosis not present

## 2020-04-23 DIAGNOSIS — D631 Anemia in chronic kidney disease: Secondary | ICD-10-CM | POA: Diagnosis not present

## 2020-04-23 DIAGNOSIS — N186 End stage renal disease: Secondary | ICD-10-CM | POA: Diagnosis not present

## 2020-04-23 DIAGNOSIS — N2581 Secondary hyperparathyroidism of renal origin: Secondary | ICD-10-CM | POA: Diagnosis not present

## 2020-04-26 DIAGNOSIS — N186 End stage renal disease: Secondary | ICD-10-CM | POA: Diagnosis not present

## 2020-04-26 DIAGNOSIS — E8809 Other disorders of plasma-protein metabolism, not elsewhere classified: Secondary | ICD-10-CM | POA: Diagnosis not present

## 2020-04-26 DIAGNOSIS — D500 Iron deficiency anemia secondary to blood loss (chronic): Secondary | ICD-10-CM | POA: Diagnosis not present

## 2020-04-26 DIAGNOSIS — D631 Anemia in chronic kidney disease: Secondary | ICD-10-CM | POA: Diagnosis not present

## 2020-04-26 DIAGNOSIS — N2581 Secondary hyperparathyroidism of renal origin: Secondary | ICD-10-CM | POA: Diagnosis not present

## 2020-04-26 DIAGNOSIS — Z992 Dependence on renal dialysis: Secondary | ICD-10-CM | POA: Diagnosis not present

## 2020-04-27 ENCOUNTER — Other Ambulatory Visit: Payer: Self-pay

## 2020-04-27 ENCOUNTER — Other Ambulatory Visit: Payer: Self-pay | Admitting: Family Medicine

## 2020-04-27 DIAGNOSIS — G4733 Obstructive sleep apnea (adult) (pediatric): Secondary | ICD-10-CM | POA: Diagnosis not present

## 2020-04-27 DIAGNOSIS — G4734 Idiopathic sleep related nonobstructive alveolar hypoventilation: Secondary | ICD-10-CM | POA: Diagnosis not present

## 2020-04-27 DIAGNOSIS — Z9181 History of falling: Secondary | ICD-10-CM | POA: Diagnosis not present

## 2020-04-27 MED ORDER — GLUCOSE BLOOD VI STRP: ORAL_STRIP | 3 refills | Status: AC

## 2020-04-28 DIAGNOSIS — D500 Iron deficiency anemia secondary to blood loss (chronic): Secondary | ICD-10-CM | POA: Diagnosis not present

## 2020-04-28 DIAGNOSIS — D631 Anemia in chronic kidney disease: Secondary | ICD-10-CM | POA: Diagnosis not present

## 2020-04-28 DIAGNOSIS — Z992 Dependence on renal dialysis: Secondary | ICD-10-CM | POA: Diagnosis not present

## 2020-04-28 DIAGNOSIS — E8809 Other disorders of plasma-protein metabolism, not elsewhere classified: Secondary | ICD-10-CM | POA: Diagnosis not present

## 2020-04-28 DIAGNOSIS — N2581 Secondary hyperparathyroidism of renal origin: Secondary | ICD-10-CM | POA: Diagnosis not present

## 2020-04-28 DIAGNOSIS — N186 End stage renal disease: Secondary | ICD-10-CM | POA: Diagnosis not present

## 2020-04-28 DIAGNOSIS — E1129 Type 2 diabetes mellitus with other diabetic kidney complication: Secondary | ICD-10-CM | POA: Diagnosis not present

## 2020-04-29 DIAGNOSIS — M2010 Hallux valgus (acquired), unspecified foot: Secondary | ICD-10-CM | POA: Diagnosis not present

## 2020-04-29 DIAGNOSIS — B351 Tinea unguium: Secondary | ICD-10-CM | POA: Diagnosis not present

## 2020-04-29 DIAGNOSIS — Z992 Dependence on renal dialysis: Secondary | ICD-10-CM | POA: Diagnosis not present

## 2020-04-29 DIAGNOSIS — E1140 Type 2 diabetes mellitus with diabetic neuropathy, unspecified: Secondary | ICD-10-CM | POA: Diagnosis not present

## 2020-04-29 DIAGNOSIS — L600 Ingrowing nail: Secondary | ICD-10-CM | POA: Diagnosis not present

## 2020-04-29 DIAGNOSIS — M79673 Pain in unspecified foot: Secondary | ICD-10-CM | POA: Diagnosis not present

## 2020-04-30 DIAGNOSIS — Z992 Dependence on renal dialysis: Secondary | ICD-10-CM | POA: Diagnosis not present

## 2020-04-30 DIAGNOSIS — N186 End stage renal disease: Secondary | ICD-10-CM | POA: Diagnosis not present

## 2020-04-30 DIAGNOSIS — D631 Anemia in chronic kidney disease: Secondary | ICD-10-CM | POA: Diagnosis not present

## 2020-04-30 DIAGNOSIS — D500 Iron deficiency anemia secondary to blood loss (chronic): Secondary | ICD-10-CM | POA: Diagnosis not present

## 2020-04-30 DIAGNOSIS — E8809 Other disorders of plasma-protein metabolism, not elsewhere classified: Secondary | ICD-10-CM | POA: Diagnosis not present

## 2020-04-30 DIAGNOSIS — N2581 Secondary hyperparathyroidism of renal origin: Secondary | ICD-10-CM | POA: Diagnosis not present

## 2020-05-03 DIAGNOSIS — N186 End stage renal disease: Secondary | ICD-10-CM | POA: Diagnosis not present

## 2020-05-03 DIAGNOSIS — E8809 Other disorders of plasma-protein metabolism, not elsewhere classified: Secondary | ICD-10-CM | POA: Diagnosis not present

## 2020-05-03 DIAGNOSIS — N2581 Secondary hyperparathyroidism of renal origin: Secondary | ICD-10-CM | POA: Diagnosis not present

## 2020-05-03 DIAGNOSIS — D631 Anemia in chronic kidney disease: Secondary | ICD-10-CM | POA: Diagnosis not present

## 2020-05-03 DIAGNOSIS — Z992 Dependence on renal dialysis: Secondary | ICD-10-CM | POA: Diagnosis not present

## 2020-05-03 DIAGNOSIS — D500 Iron deficiency anemia secondary to blood loss (chronic): Secondary | ICD-10-CM | POA: Diagnosis not present

## 2020-05-05 DIAGNOSIS — N2581 Secondary hyperparathyroidism of renal origin: Secondary | ICD-10-CM | POA: Diagnosis not present

## 2020-05-05 DIAGNOSIS — Z992 Dependence on renal dialysis: Secondary | ICD-10-CM | POA: Diagnosis not present

## 2020-05-05 DIAGNOSIS — D500 Iron deficiency anemia secondary to blood loss (chronic): Secondary | ICD-10-CM | POA: Diagnosis not present

## 2020-05-05 DIAGNOSIS — E8809 Other disorders of plasma-protein metabolism, not elsewhere classified: Secondary | ICD-10-CM | POA: Diagnosis not present

## 2020-05-05 DIAGNOSIS — D631 Anemia in chronic kidney disease: Secondary | ICD-10-CM | POA: Diagnosis not present

## 2020-05-05 DIAGNOSIS — N186 End stage renal disease: Secondary | ICD-10-CM | POA: Diagnosis not present

## 2020-05-07 DIAGNOSIS — D500 Iron deficiency anemia secondary to blood loss (chronic): Secondary | ICD-10-CM | POA: Diagnosis not present

## 2020-05-07 DIAGNOSIS — N2581 Secondary hyperparathyroidism of renal origin: Secondary | ICD-10-CM | POA: Diagnosis not present

## 2020-05-07 DIAGNOSIS — N186 End stage renal disease: Secondary | ICD-10-CM | POA: Diagnosis not present

## 2020-05-07 DIAGNOSIS — D631 Anemia in chronic kidney disease: Secondary | ICD-10-CM | POA: Diagnosis not present

## 2020-05-07 DIAGNOSIS — E8809 Other disorders of plasma-protein metabolism, not elsewhere classified: Secondary | ICD-10-CM | POA: Diagnosis not present

## 2020-05-07 DIAGNOSIS — Z992 Dependence on renal dialysis: Secondary | ICD-10-CM | POA: Diagnosis not present

## 2020-05-10 DIAGNOSIS — N186 End stage renal disease: Secondary | ICD-10-CM | POA: Diagnosis not present

## 2020-05-10 DIAGNOSIS — D500 Iron deficiency anemia secondary to blood loss (chronic): Secondary | ICD-10-CM | POA: Diagnosis not present

## 2020-05-10 DIAGNOSIS — Z79899 Other long term (current) drug therapy: Secondary | ICD-10-CM | POA: Diagnosis not present

## 2020-05-10 DIAGNOSIS — E8809 Other disorders of plasma-protein metabolism, not elsewhere classified: Secondary | ICD-10-CM | POA: Diagnosis not present

## 2020-05-10 DIAGNOSIS — D631 Anemia in chronic kidney disease: Secondary | ICD-10-CM | POA: Diagnosis not present

## 2020-05-10 DIAGNOSIS — M47896 Other spondylosis, lumbar region: Secondary | ICD-10-CM | POA: Diagnosis not present

## 2020-05-10 DIAGNOSIS — N2581 Secondary hyperparathyroidism of renal origin: Secondary | ICD-10-CM | POA: Diagnosis not present

## 2020-05-10 DIAGNOSIS — G4700 Insomnia, unspecified: Secondary | ICD-10-CM | POA: Diagnosis not present

## 2020-05-10 DIAGNOSIS — M545 Low back pain: Secondary | ICD-10-CM | POA: Diagnosis not present

## 2020-05-10 DIAGNOSIS — Z992 Dependence on renal dialysis: Secondary | ICD-10-CM | POA: Diagnosis not present

## 2020-05-10 DIAGNOSIS — G8929 Other chronic pain: Secondary | ICD-10-CM | POA: Diagnosis not present

## 2020-05-12 DIAGNOSIS — N186 End stage renal disease: Secondary | ICD-10-CM | POA: Diagnosis not present

## 2020-05-12 DIAGNOSIS — Z992 Dependence on renal dialysis: Secondary | ICD-10-CM | POA: Diagnosis not present

## 2020-05-12 DIAGNOSIS — E8809 Other disorders of plasma-protein metabolism, not elsewhere classified: Secondary | ICD-10-CM | POA: Diagnosis not present

## 2020-05-12 DIAGNOSIS — D500 Iron deficiency anemia secondary to blood loss (chronic): Secondary | ICD-10-CM | POA: Diagnosis not present

## 2020-05-12 DIAGNOSIS — N2581 Secondary hyperparathyroidism of renal origin: Secondary | ICD-10-CM | POA: Diagnosis not present

## 2020-05-12 DIAGNOSIS — D631 Anemia in chronic kidney disease: Secondary | ICD-10-CM | POA: Diagnosis not present

## 2020-05-14 DIAGNOSIS — N186 End stage renal disease: Secondary | ICD-10-CM | POA: Diagnosis not present

## 2020-05-14 DIAGNOSIS — N2581 Secondary hyperparathyroidism of renal origin: Secondary | ICD-10-CM | POA: Diagnosis not present

## 2020-05-14 DIAGNOSIS — E8809 Other disorders of plasma-protein metabolism, not elsewhere classified: Secondary | ICD-10-CM | POA: Diagnosis not present

## 2020-05-14 DIAGNOSIS — D500 Iron deficiency anemia secondary to blood loss (chronic): Secondary | ICD-10-CM | POA: Diagnosis not present

## 2020-05-14 DIAGNOSIS — Z992 Dependence on renal dialysis: Secondary | ICD-10-CM | POA: Diagnosis not present

## 2020-05-14 DIAGNOSIS — D631 Anemia in chronic kidney disease: Secondary | ICD-10-CM | POA: Diagnosis not present

## 2020-05-17 DIAGNOSIS — N2581 Secondary hyperparathyroidism of renal origin: Secondary | ICD-10-CM | POA: Diagnosis not present

## 2020-05-17 DIAGNOSIS — E8809 Other disorders of plasma-protein metabolism, not elsewhere classified: Secondary | ICD-10-CM | POA: Diagnosis not present

## 2020-05-17 DIAGNOSIS — D631 Anemia in chronic kidney disease: Secondary | ICD-10-CM | POA: Diagnosis not present

## 2020-05-17 DIAGNOSIS — N186 End stage renal disease: Secondary | ICD-10-CM | POA: Diagnosis not present

## 2020-05-17 DIAGNOSIS — Z992 Dependence on renal dialysis: Secondary | ICD-10-CM | POA: Diagnosis not present

## 2020-05-17 DIAGNOSIS — D500 Iron deficiency anemia secondary to blood loss (chronic): Secondary | ICD-10-CM | POA: Diagnosis not present

## 2020-05-19 DIAGNOSIS — N2581 Secondary hyperparathyroidism of renal origin: Secondary | ICD-10-CM | POA: Diagnosis not present

## 2020-05-19 DIAGNOSIS — D631 Anemia in chronic kidney disease: Secondary | ICD-10-CM | POA: Diagnosis not present

## 2020-05-19 DIAGNOSIS — N186 End stage renal disease: Secondary | ICD-10-CM | POA: Diagnosis not present

## 2020-05-19 DIAGNOSIS — Z992 Dependence on renal dialysis: Secondary | ICD-10-CM | POA: Diagnosis not present

## 2020-05-19 DIAGNOSIS — E8809 Other disorders of plasma-protein metabolism, not elsewhere classified: Secondary | ICD-10-CM | POA: Diagnosis not present

## 2020-05-19 DIAGNOSIS — D500 Iron deficiency anemia secondary to blood loss (chronic): Secondary | ICD-10-CM | POA: Diagnosis not present

## 2020-05-20 DIAGNOSIS — R300 Dysuria: Secondary | ICD-10-CM | POA: Diagnosis not present

## 2020-05-20 DIAGNOSIS — Z992 Dependence on renal dialysis: Secondary | ICD-10-CM | POA: Diagnosis not present

## 2020-05-20 DIAGNOSIS — N186 End stage renal disease: Secondary | ICD-10-CM | POA: Diagnosis not present

## 2020-05-20 DIAGNOSIS — D3002 Benign neoplasm of left kidney: Secondary | ICD-10-CM | POA: Diagnosis not present

## 2020-05-21 DIAGNOSIS — D631 Anemia in chronic kidney disease: Secondary | ICD-10-CM | POA: Diagnosis not present

## 2020-05-21 DIAGNOSIS — Z992 Dependence on renal dialysis: Secondary | ICD-10-CM | POA: Diagnosis not present

## 2020-05-21 DIAGNOSIS — N2581 Secondary hyperparathyroidism of renal origin: Secondary | ICD-10-CM | POA: Diagnosis not present

## 2020-05-21 DIAGNOSIS — E8809 Other disorders of plasma-protein metabolism, not elsewhere classified: Secondary | ICD-10-CM | POA: Diagnosis not present

## 2020-05-21 DIAGNOSIS — Z23 Encounter for immunization: Secondary | ICD-10-CM | POA: Diagnosis not present

## 2020-05-21 DIAGNOSIS — N186 End stage renal disease: Secondary | ICD-10-CM | POA: Diagnosis not present

## 2020-05-21 DIAGNOSIS — D500 Iron deficiency anemia secondary to blood loss (chronic): Secondary | ICD-10-CM | POA: Diagnosis not present

## 2020-05-24 DIAGNOSIS — N2581 Secondary hyperparathyroidism of renal origin: Secondary | ICD-10-CM | POA: Diagnosis not present

## 2020-05-24 DIAGNOSIS — D631 Anemia in chronic kidney disease: Secondary | ICD-10-CM | POA: Diagnosis not present

## 2020-05-24 DIAGNOSIS — Z992 Dependence on renal dialysis: Secondary | ICD-10-CM | POA: Diagnosis not present

## 2020-05-24 DIAGNOSIS — N186 End stage renal disease: Secondary | ICD-10-CM | POA: Diagnosis not present

## 2020-05-24 DIAGNOSIS — D500 Iron deficiency anemia secondary to blood loss (chronic): Secondary | ICD-10-CM | POA: Diagnosis not present

## 2020-05-25 ENCOUNTER — Emergency Department (HOSPITAL_COMMUNITY): Payer: Medicare Other

## 2020-05-25 ENCOUNTER — Other Ambulatory Visit: Payer: Self-pay

## 2020-05-25 ENCOUNTER — Encounter (HOSPITAL_COMMUNITY): Payer: Self-pay

## 2020-05-25 ENCOUNTER — Emergency Department (HOSPITAL_COMMUNITY)
Admission: EM | Admit: 2020-05-25 | Discharge: 2020-05-26 | Disposition: A | Discharge status: Home / Self Care | Payer: Medicare Other | Attending: Emergency Medicine | Admitting: Emergency Medicine

## 2020-05-25 DIAGNOSIS — E1159 Type 2 diabetes mellitus with other circulatory complications: Secondary | ICD-10-CM | POA: Insufficient documentation

## 2020-05-25 DIAGNOSIS — J45909 Unspecified asthma, uncomplicated: Secondary | ICD-10-CM | POA: Diagnosis not present

## 2020-05-25 DIAGNOSIS — R109 Unspecified abdominal pain: Secondary | ICD-10-CM | POA: Diagnosis not present

## 2020-05-25 DIAGNOSIS — R1030 Lower abdominal pain, unspecified: Secondary | ICD-10-CM

## 2020-05-25 DIAGNOSIS — Z992 Dependence on renal dialysis: Secondary | ICD-10-CM | POA: Diagnosis not present

## 2020-05-25 DIAGNOSIS — R10814 Left lower quadrant abdominal tenderness: Secondary | ICD-10-CM | POA: Diagnosis not present

## 2020-05-25 DIAGNOSIS — Z955 Presence of coronary angioplasty implant and graft: Secondary | ICD-10-CM | POA: Insufficient documentation

## 2020-05-25 DIAGNOSIS — I2510 Atherosclerotic heart disease of native coronary artery without angina pectoris: Secondary | ICD-10-CM | POA: Diagnosis not present

## 2020-05-25 DIAGNOSIS — E1129 Type 2 diabetes mellitus with other diabetic kidney complication: Secondary | ICD-10-CM | POA: Diagnosis not present

## 2020-05-25 DIAGNOSIS — I509 Heart failure, unspecified: Secondary | ICD-10-CM | POA: Insufficient documentation

## 2020-05-25 DIAGNOSIS — Z79899 Other long term (current) drug therapy: Secondary | ICD-10-CM | POA: Diagnosis not present

## 2020-05-25 DIAGNOSIS — E1140 Type 2 diabetes mellitus with diabetic neuropathy, unspecified: Secondary | ICD-10-CM | POA: Diagnosis not present

## 2020-05-25 DIAGNOSIS — Z7982 Long term (current) use of aspirin: Secondary | ICD-10-CM | POA: Diagnosis not present

## 2020-05-25 DIAGNOSIS — R1110 Vomiting, unspecified: Secondary | ICD-10-CM | POA: Diagnosis not present

## 2020-05-25 DIAGNOSIS — I130 Hypertensive heart and chronic kidney disease with heart failure and stage 1 through stage 4 chronic kidney disease, or unspecified chronic kidney disease: Secondary | ICD-10-CM | POA: Insufficient documentation

## 2020-05-25 DIAGNOSIS — R1032 Left lower quadrant pain: Secondary | ICD-10-CM | POA: Diagnosis not present

## 2020-05-25 DIAGNOSIS — N184 Chronic kidney disease, stage 4 (severe): Secondary | ICD-10-CM | POA: Diagnosis not present

## 2020-05-25 DIAGNOSIS — Z794 Long term (current) use of insulin: Secondary | ICD-10-CM | POA: Diagnosis not present

## 2020-05-25 LAB — CBC WITH DIFFERENTIAL/PLATELET
Abs Immature Granulocytes: 0.05 10*3/uL (ref 0.00–0.07)
Basophils Absolute: 0 10*3/uL (ref 0.0–0.1)
Basophils Relative: 0 %
Eosinophils Absolute: 0 10*3/uL (ref 0.0–0.5)
Eosinophils Relative: 0 %
HCT: 35.7 % — ABNORMAL LOW (ref 39.0–52.0)
Hemoglobin: 11.8 g/dL — ABNORMAL LOW (ref 13.0–17.0)
Immature Granulocytes: 1 %
Lymphocytes Relative: 16 %
Lymphs Abs: 1.5 10*3/uL (ref 0.7–4.0)
MCH: 34 pg (ref 26.0–34.0)
MCHC: 33.1 g/dL (ref 30.0–36.0)
MCV: 102.9 fL — ABNORMAL HIGH (ref 80.0–100.0)
Monocytes Absolute: 0.7 10*3/uL (ref 0.1–1.0)
Monocytes Relative: 8 %
Neutro Abs: 7.1 10*3/uL (ref 1.7–7.7)
Neutrophils Relative %: 75 %
Platelets: 201 10*3/uL (ref 150–400)
RBC: 3.47 MIL/uL — ABNORMAL LOW (ref 4.22–5.81)
RDW: 12.8 % (ref 11.5–15.5)
WBC: 9.4 10*3/uL (ref 4.0–10.5)
nRBC: 0 % (ref 0.0–0.2)

## 2020-05-25 LAB — BASIC METABOLIC PANEL
Anion gap: 16 — ABNORMAL HIGH (ref 5–15)
BUN: 45 mg/dL — ABNORMAL HIGH (ref 8–23)
CO2: 28 mmol/L (ref 22–32)
Calcium: 8.6 mg/dL — ABNORMAL LOW (ref 8.9–10.3)
Chloride: 91 mmol/L — ABNORMAL LOW (ref 98–111)
Creatinine, Ser: 7.94 mg/dL — ABNORMAL HIGH (ref 0.61–1.24)
GFR calc non Af Amer: 6 mL/min — ABNORMAL LOW (ref >60)
Glucose, Bld: 188 mg/dL — ABNORMAL HIGH (ref 70–99)
Potassium: 5 mmol/L (ref 3.5–5.1)
Sodium: 135 mmol/L (ref 135–145)

## 2020-05-25 MED ORDER — IOHEXOL 300 MG/ML  SOLN
100.0000 mL | Freq: Once | INTRAMUSCULAR | Status: AC | PRN
  Administered 2020-05-25: 100 mL via INTRAVENOUS

## 2020-05-25 MED ORDER — HYDROMORPHONE HCL 1 MG/ML IJ SOLN
1.0000 mg | Freq: Once | INTRAMUSCULAR | Status: AC
  Administered 2020-05-25: 1 mg via INTRAMUSCULAR
  Filled 2020-05-25: qty 1

## 2020-05-25 NOTE — Discharge Instructions (Signed)
You were seen in the emergency department for your abdominal pain and diarrhea.  Your blood work and CT scan were reassuring.  Please complete your current antibiotic regimen of ciprofloxacin.  Some antibiotics can cause upset of your belly, it is possible that this is the source of your symptoms.  We were able to rule out acute infection in your belly with your CT scan. Your CT scan did reveal some interval increase in the fluid around your left kidney.  Will be important to discuss this with your urologist. Please follow-up with your primary care doctor in 1 week for reevaluation. Please return to [place] should you develop any new worsening abdominal pain, not intractable nausea/vomiting/diarrhea, blood in your stool, or new or severe symptoms.

## 2020-05-25 NOTE — ED Triage Notes (Signed)
Pt presents to ED for left sided abdominal pain since 1200 today. Pt states he has had diarrhea and vomiting today, also noticed blood in his urine.

## 2020-05-25 NOTE — ED Provider Notes (Signed)
69 y/o male Obese - hx of having LLQ abd pain since noon - Has had 4 soft BM's today.   Radiates to L side slightly - slight hematuria Seen by urrology and on Ci[pro for UTI - passing clots in past - follwoed by Uro.  Dialysis MWF, for 3 years.  Has L renal tumor (benign),  No f/c, fully vaccinated  Has had this in the past -   Increased BS, minimally tender LLQ - on deep palpation No tachycardia, no peritoneal signs  Consider diverticulitis, colitis, renal colic, enteritis, well appearing, pt does not need imaging - give abx for div  Medical screening examination/treatment/procedure(s) were conducted as a shared visit with non-physician practitioner(s) and myself.  I personally evaluated the patient during the encounter.  Clinical Impression:   Final diagnoses:  Lower abdominal pain         Noemi Chapel, MD 05/28/20 [phone]

## 2020-05-25 NOTE — ED Provider Notes (Signed)
[facility] EMERGENCY DEPARTMENT Provider Note   CSN: [id number] Arrival date & time: 05/25/20  1709     History Chief Complaint  Patient presents with   Abdominal Pain    Kent Osborne is a 69 y.o. male presenting with 6 hours of left lower abdominal pain and 4 episodes of soft stool.  He denies blood in his stool; states that his stool is very dark in color, however this is normal for him due to binders that he takes as a dialysis patient.  He states he takes stool softeners daily as he is prone to constipation.  He states that the caliber and appearance of the stool are normal for him.  He states that occasionally will have multiple bowel movements in 1 day if he has not had bowel movements the day prior.  He reports that the pain has been constant and dull since about noon this afternoon.  At 1:00pm  he took a oxycodone at home, with significant relief of his pain.  Only he is concerned about hematuria that he noted in the toilet this morning, bright red blood.  He does follow with urology for hematuria.  Patient does not make much urine, end-stage renal disease.  He states he has been on ciprofloxacin since 05/20/2020, per urologist.  Additionally he is already on Flomax. He states he has had multiple kidney stones in the past, but states that this pain is different.  Additionally he states that he has had multiple episodes of similar abdominal pain and loose stool in the past, each of resolved spontaneously on their own after several hours.  Personally reviewed this patient's medical record.  He is a dialysis patient x3 years, on dialysis Monday Wednesday Friday weekly.  Additionally he has a benign left renal mass that is followed annually with MR imaging.  MRI of abdomen pelvis in March 2021 without evidence of abdominal aortic aneurysm. Additional medical history significant for hypertension, hyperlipidemia, coronary artery disease, NSTEMI, ongoing hematuria, diabetes type 2,  depression, heart failure.  Mr. Hyland states he has never had a colonoscopy.  HPI     Past Medical History:  Diagnosis Date   Acute renal failure superimposed on chronic kidney disease ([place])    /ntoes 11/01/2016   Anemia    Arthritis    "back, knees" (11/01/2016)   Asthma    CAD (coronary artery disease) 12/31/07   [facility] stents- placed in the circumflex and LAD   Chronic bronchitis ([place])    Hemodialysis status ([place])    History of blood transfusion 10/30/2016   "related to anemia"   History of gout    History of kidney stones    Hyperlipidemia    Hypertension    NSTEMI (non-ST elevated myocardial infarction) ([place]) 10/30/2016   OA (osteoarthritis)    Obese    On home oxygen therapy    "2L w/CPAP" (11/01/2016)   OSA on CPAP    Psoriatic arthritis ([place])    Type II diabetes mellitus ([place])     Patient Active Problem List   Diagnosis Date Noted   Heart failure, unspecified HF chronicity, unspecified heart failure type ([place]) 08/26/2019   Type II diabetes mellitus with renal manifestations ([place]) 03/01/2018   GERD (gastroesophageal reflux disease) 03/01/2018   Depression 03/01/2018   Urinary retention 03/01/2018   Normocytic anemia 03/01/2018   BPH associated with nocturia 12/24/2017   Elevated troponin 11/01/2016   NSTEMI (non-ST elevated myocardial infarction) ([place]) 11/01/2016   Acute renal failure superimposed  on stage 4 chronic kidney disease (HCC) 11/01/2016   Diabetes mellitus with complication (HCC) 11/01/2016   COPD (chronic obstructive pulmonary disease) (HCC) 11/01/2016   OSA (obstructive sleep apnea) 11/01/2016   Symptomatic anemia 10/30/2016   Morbid obesity (HCC) 04/29/2016   Gout, arthropathy 04/27/2016   Diabetic peripheral neuropathy associated with type 2 diabetes mellitus (HCC) 08/20/2014   Type II diabetes mellitus with circulatory disorder, uncontrolled 07/09/2014   Renal insufficiency 02/29/2012    HEMATURIA 07/27/2010   GOUT, UNSPECIFIED 07/05/2010   BACK PAIN, LUMBAR 04/18/2010   Backache 01/06/2010   GENERALIZED OSTEOARTHROSIS INVOLVING HAND 07/20/2009   Chronic kidney disease (CKD), stage IV (severe) (HCC) 04/12/2009   PERIPHERAL EDEMA 12/31/2008   Asthma with acute exacerbation 10/08/2008   Osteoarthritis 07/28/2008   Hyperlipemia 01/06/2008   Coronary atherosclerosis 01/06/2008   Chronic ischemic heart disease 12/31/2007   Essential hypertension 02/21/2007   CHOLELITHIASIS 02/21/2007   NEPHROLITHIASIS, HX OF 02/21/2007    Past Surgical History:  Procedure Laterality Date   AV FISTULA PLACEMENT     CORONARY ANGIOPLASTY WITH STENT PLACEMENT  2009   "3 stents"   CYSTOSCOPY WITH URETEROSCOPY AND STENT PLACEMENT Bilateral 04/23/2018   Procedure: CYSTOSCOPY WITH BLADDER  BIOPSY BILATERAL RETROGRADE PYELOGRAM LEFT URETEROSCOPY WITH BIOPSY AND LEFT URETERAL  STENT PLACEMENT;  Surgeon: Jerilee Field, MD;  Location: [facility];  Service: Urology;  Laterality: Bilateral;       Family History  Problem Relation Age of Onset   Diabetes Other    Hyperlipidemia Other    Hypertension Other     Social History   Tobacco Use   Smoking status: Never Smoker   Smokeless tobacco: Never Used  Vaping Use   Vaping Use: Never used  Substance Use Topics   Alcohol use: Not Currently    Comment: 11/01/2016 "nothing in years"   Drug use: No    Home Medications Prior to Admission medications   Medication Sig Start Date End Date Taking? Authorizing Provider  amitriptyline (ELAVIL) 25 MG tablet Take 25 mg by mouth at bedtime.    Yes [provider]  aspirin EC 81 MG tablet Take 81 mg by mouth daily.   Yes [provider]  AURYXIA 1 GM 210 MG(Fe) tablet  05/27/18  Yes [provider]  carvedilol (COREG) 6.25 MG tablet TAKE 1 TABLET BY MOUTH TWICE DAILY WITH A MEAL 04/06/20  Yes [place], Betty G, MD  Cholecalciferol (VITAMIN D) 2000 units  tablet Take 2,000 Units by mouth daily.   Yes [provider]  ciprofloxacin (CIPRO) 500 MG tablet Take 500 mg by mouth daily. 05/20/20 05/26/20 Yes [provider]  colchicine 0.6 MG tablet TAKE 2 TABLETS BY MOUTH AS SOON AS GOUT SYMPTOMS START 09/05/19  Yes [place], Betty G, MD  hydrALAZINE (APRESOLINE) 50 MG tablet Take 1 tablet (50 mg total) by mouth 3 (three) times daily. 03/09/17  Yes [place], Betty G, MD  insulin degludec (TRESIBA FLEXTOUCH) 100 UNIT/ML FlexTouch Pen INJECT 60 UNITS SUB-Q ONCE DAILY 01/16/20  Yes [place], Betty G, MD  isosorbide mononitrate (IMDUR) 60 MG 24 hr tablet Take 1 tablet by mouth once daily 08/21/19  Yes [place], Betty G, MD  montelukast (SINGULAIR) 10 MG tablet Take 10 mg by mouth at bedtime.    Yes [provider]  nitroGLYCERIN (NITROSTAT) 0.4 MG SL tablet Place 1 tablet (0.4 mg total) under the tongue every 5 (five) minutes as needed for chest pain. 11/03/16  Yes Rai, Delene Ruffini, MD  NOVOLOG  FLEXPEN 100 UNIT/ML FlexPen INJECT 2 TO 25 UNITS SUBCUTANEOUSLY 3 TIMES A DAY WITH MEALS PER SLIDING SCALE 08/26/19  Yes [place], Betty G, MD  Oxycodone HCl 10 MG TABS Take 10 mg by mouth 4 (four) times daily as needed (for pain).    Yes Arcedo, Perico, DO  simvastatin (ZOCOR) 40 MG tablet TAKE 1 TABLET BY MOUTH AT BEDTIME 03/02/20  Yes [place], Betty G, MD  tamsulosin (FLOMAX) 0.4 MG CAPS capsule Take 1 capsule (0.4 mg total) by mouth daily. 03/05/18  Yes Florencia Reasons, MD  calcium acetate (PHOSLO) 667 MG capsule Take 1 capsule (667 mg total) by mouth 3 (three) times daily with meals. Patient not taking: Reported on 05/25/2020 03/04/18   Florencia Reasons, MD  Continuous Blood Gluc Receiver (DEXCOM G5 RECEIVER KIT) DEVI 1 kit by Does not apply route as directed. 01/06/20   [place], Betty G, MD  glucose blood test strip USE 1 STRIP TO CHECK GLUCOSE THREE TIMES DAILY 04/27/20   [place], Betty G, MD  lidocaine-prilocaine (EMLA) cream APPLY SMALL AMOUNT TO ACCESS SITE (AVF) 1 TO 2 HOURS  BEFORE DIALYSIS. COVER WITH OCCLUSIVE DRESSING Al [place] WRAP) 05/20/18   [provider]    Allergies    Patient has no known allergies.  Review of Systems   Review of Systems  Physical Exam Updated Vital Signs BP 126/62    Pulse (!) 104    Temp 98.3 F (36.8 C) (Oral)    Resp 18    Ht $R'5\' 8"'eK$  (1.727 m)    Wt 127 kg    SpO2 100%    BMI 42.57 kg/m   Physical Exam  ED Results / Procedures / Treatments   Labs (all labs ordered are listed, but only abnormal results are displayed) Labs Reviewed  CBC WITH DIFFERENTIAL/PLATELET - Abnormal; Notable for the following components:      Result Value   RBC 3.47 (*)    Hemoglobin 11.8 (*)    HCT 35.7 (*)    MCV 102.9 (*)    All other components within normal limits  BASIC METABOLIC PANEL - Abnormal; Notable for the following components:   Chloride 91 (*)    Glucose, Bld 188 (*)    BUN 45 (*)    Creatinine, Ser 7.94 (*)    Calcium 8.6 (*)    GFR calc non Af Amer 6 (*)    Anion gap 16 (*)    All other components within normal limits    EKG None  Radiology CT Abdomen Pelvis W Contrast  Result Date: 05/25/2020 CLINICAL DATA:  Acute left-sided abdominal pain. Diarrhea and vomiting. EXAM: CT ABDOMEN AND PELVIS WITH CONTRAST TECHNIQUE: Multidetector CT imaging of the abdomen and pelvis was performed using the standard protocol following bolus administration of intravenous contrast. CONTRAST:  151mL OMNIPAQUE IOHEXOL 300 MG/ML  SOLN COMPARISON:  Abdominal MRI 11/18/2019.  CT 02/06/2020 FINDINGS: Lower chest: Mild cardiomegaly. Coronary artery calcifications or stents. Breathing motion artifact through the lung bases. Trace left pleural thickening. Hepatobiliary: Prominent liver spanning 20 cm cranial caudal. No focal hepatic abnormality. Questionable layering stones/sludge in the gallbladder. No pericholecystic inflammation. No biliary dilatation Pancreas: No ductal dilatation or inflammation. Spleen: Normal in size without focal  abnormality. Adrenals/Urinary Tract: There is no adrenal nodule. Left hydronephrosis is chronic but has progressed from prior CT. There is high density within the renal pelvis posterior medially, with high density extending into a dilated left ureter. The question renal pelvis masses again seen but not well-defined on  the current exam, tentatively spanning 3.6 cm. Tapering of the distal left ureter noted in the pelvis without ureteral stone, transition coronal reformat series 5, image 68. The degree of ureteral dilatation has progressed from prior exam. There is absent renal excretion from the left renal collecting system on delayed phase imaging. Exophytic lesion from the lower left kidney, complex cyst on prior MRI. Parenchymal thinning of the right kidney with lobulated contours. There is no right hydronephrosis. Small amount of excreted IV contrast in the right renal collecting system on delayed phase. Urinary bladder is near only minimally distended. Stomach/Bowel: Decompressed stomach. Normal positioning of the duodenum and ligament of Treitz. There is no small bowel obstruction or inflammatory change. Normal appendix. Diverticulosis of the distal descending and sigmoid colon. No diverticulitis. Vascular/Lymphatic: Aortic atherosclerosis. No aortic aneurysm. Patent portal vein. Small left retroperitoneal nodes at the level of the mid kidney measure up to 7 mm short axis, nonspecific. There is no pelvic adenopathy. Reproductive: Prostate is unremarkable. Other: No ascites no free air. Subcutaneous densities in the lower anterior abdominal wall typical of medication injection sites. Patulous inguinal canals. Musculoskeletal: Slight chronic T11 compression fracture, unchanged. Multilevel degenerative change in the spine. There are no acute or suspicious osseous abnormalities. IMPRESSION: 1. Chronic left hydronephrosis, progressed from prior CT. There is high density within the renal pelvis posterior medially,  with high density material extending into a dilated left ureter. The questioned left renal pelvis mass again seen but not well-defined on the current exam, tentatively spanning 3.6 cm. 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 The high-density material in the left ureter may represent blood products, debris, or less likely neoplasm. Retrograde cystoscopy may be of value. 2. Small left retroperitoneal nodes at the level of the mid kidney measure up to 7 mm short axis, nonspecific. 3. Colonic diverticulosis without diverticulitis. 4. Questionable layering stones/sludge in the gallbladder without pericholecystic inflammation. Aortic Atherosclerosis (ICD10-I70.0). Electronically Signed   By: Keith Rake M.D.   On: 05/25/2020 22:02    Procedures Procedures (including critical care time)  Medications Ordered in ED Medications  HYDROmorphone (DILAUDID) injection 1 mg (has no administration in time range)  iohexol (OMNIPAQUE) 300 MG/ML solution 100 mL (100 mLs Intravenous Contrast Given 05/25/20 2129)    ED Course  I have reviewed the triage vital signs and the nursing notes.  Pertinent labs & imaging results that were available during my care of the patient were reviewed by me and considered in my medical decision making (see chart for details).    MDM Rules/Calculators/A&P                           Patient with end-stage renal disease, dialysis dependent x3 weekly.  6 hours of left lower quadrant abdominal pain, soft stool.  Differential diagnosis for this patient's abdominal plain include but are not limited to abdominal aortic aneurysm, diverticulitis, colitis, enteritis, adverse reaction to antibiotic therapy (patient on day 6 of oral Cipro), or nephrolithiasis.   Patient on day 6 of Cipro, per urologist  History of hematuria.  MRI 10/2019 without evidence of abdominal aortic aneurysm.  Patient has never had a colonoscopy.   On physical exam this patient does not have  a surgical abdomen.  Will obtain CBC to evaluate for leukocytosis, will consider CT of the abdomen pelvis pending blood work.   CBC with mild anemia of 11.8, previously 10.  Otherwise unremarkable.  Patient remains borderline tachycardic with heart rate  in the 90s, states his pain is persistent to 4/10 (previously 8/10).    Will order CT abdomen pelvis without acute intra-abdominal process.  CT scan revealed interval increase in the known left hydronephrosis.  Diverticulosis without diverticulitis.  At the time of my reevaluation of the patient he is sitting at the end of his bed, looks uncomfortable.  States his pain is 8/10.  I discussed his CT results with him, we have ruled out diverticulitis differential diagnosis.  Pain may be exacerbated by increasing left hydronephrosis.  I printed out a copy of patient's CT scan results and handed to him.  Encouraged him to discuss these results with his urologist/nephrologist. Given reassuring laboratory studies and CT scan, at this time I do not feel any further work-up is necessary in the emergency department.  Patient remains mildly tachycardic around 100 bpm, will administer 1 mg Dilaudid IM prior to departure for pain.  Baden voiced understanding of his work-up and treatment plan, each of his questions answered to his expressed satisfaction.  He is amenable to discharge at this time.  Strict return precautions were given.  Patient is stable for discharge at this time.  STRYKER VEASEY was evaluated in Emergency Department on 05/25/2020 for the symptoms described in the history of present illness. He was evaluated in the context of the global COVID-19 pandemic, which necessitated consideration that the patient might be at risk for infection with the SARS-CoV-2 virus that causes COVID-19. Institutional protocols and algorithms that pertain to the evaluation of patients at risk for COVID-19 are in a state of rapid change based on information released by  regulatory bodies including the CDC and federal and state organizations. These policies and algorithms were followed during the patient's care in the ED.   Final Clinical Impression(s) / ED Diagnoses Final diagnoses:  None    Rx / DC Orders ED Discharge Orders    None       Aura Dials 05/25/20 2256    Noemi Chapel, MD 05/28/20 [phone]

## 2020-05-25 NOTE — ED Notes (Signed)
ED Provider at bedside. 

## 2020-05-28 DIAGNOSIS — N186 End stage renal disease: Secondary | ICD-10-CM | POA: Diagnosis not present

## 2020-05-28 DIAGNOSIS — Z992 Dependence on renal dialysis: Secondary | ICD-10-CM | POA: Diagnosis not present

## 2020-05-28 DIAGNOSIS — D631 Anemia in chronic kidney disease: Secondary | ICD-10-CM | POA: Diagnosis not present

## 2020-05-28 DIAGNOSIS — D500 Iron deficiency anemia secondary to blood loss (chronic): Secondary | ICD-10-CM | POA: Diagnosis not present

## 2020-05-28 DIAGNOSIS — N2581 Secondary hyperparathyroidism of renal origin: Secondary | ICD-10-CM | POA: Diagnosis not present

## 2020-05-31 DIAGNOSIS — N186 End stage renal disease: Secondary | ICD-10-CM | POA: Diagnosis not present

## 2020-05-31 DIAGNOSIS — D631 Anemia in chronic kidney disease: Secondary | ICD-10-CM | POA: Diagnosis not present

## 2020-05-31 DIAGNOSIS — Z992 Dependence on renal dialysis: Secondary | ICD-10-CM | POA: Diagnosis not present

## 2020-05-31 DIAGNOSIS — D500 Iron deficiency anemia secondary to blood loss (chronic): Secondary | ICD-10-CM | POA: Diagnosis not present

## 2020-05-31 DIAGNOSIS — N2581 Secondary hyperparathyroidism of renal origin: Secondary | ICD-10-CM | POA: Diagnosis not present

## 2020-06-02 DIAGNOSIS — N2581 Secondary hyperparathyroidism of renal origin: Secondary | ICD-10-CM | POA: Diagnosis not present

## 2020-06-02 DIAGNOSIS — D631 Anemia in chronic kidney disease: Secondary | ICD-10-CM | POA: Diagnosis not present

## 2020-06-02 DIAGNOSIS — D500 Iron deficiency anemia secondary to blood loss (chronic): Secondary | ICD-10-CM | POA: Diagnosis not present

## 2020-06-02 DIAGNOSIS — N186 End stage renal disease: Secondary | ICD-10-CM | POA: Diagnosis not present

## 2020-06-02 DIAGNOSIS — Z992 Dependence on renal dialysis: Secondary | ICD-10-CM | POA: Diagnosis not present

## 2020-06-04 DIAGNOSIS — N2581 Secondary hyperparathyroidism of renal origin: Secondary | ICD-10-CM | POA: Diagnosis not present

## 2020-06-04 DIAGNOSIS — Z992 Dependence on renal dialysis: Secondary | ICD-10-CM | POA: Diagnosis not present

## 2020-06-04 DIAGNOSIS — D500 Iron deficiency anemia secondary to blood loss (chronic): Secondary | ICD-10-CM | POA: Diagnosis not present

## 2020-06-04 DIAGNOSIS — D631 Anemia in chronic kidney disease: Secondary | ICD-10-CM | POA: Diagnosis not present

## 2020-06-04 DIAGNOSIS — N186 End stage renal disease: Secondary | ICD-10-CM | POA: Diagnosis not present

## 2020-06-07 DIAGNOSIS — Z992 Dependence on renal dialysis: Secondary | ICD-10-CM | POA: Diagnosis not present

## 2020-06-07 DIAGNOSIS — D500 Iron deficiency anemia secondary to blood loss (chronic): Secondary | ICD-10-CM | POA: Diagnosis not present

## 2020-06-07 DIAGNOSIS — N186 End stage renal disease: Secondary | ICD-10-CM | POA: Diagnosis not present

## 2020-06-07 DIAGNOSIS — D631 Anemia in chronic kidney disease: Secondary | ICD-10-CM | POA: Diagnosis not present

## 2020-06-07 DIAGNOSIS — N2581 Secondary hyperparathyroidism of renal origin: Secondary | ICD-10-CM | POA: Diagnosis not present

## 2020-06-09 DIAGNOSIS — N186 End stage renal disease: Secondary | ICD-10-CM | POA: Diagnosis not present

## 2020-06-09 DIAGNOSIS — Z992 Dependence on renal dialysis: Secondary | ICD-10-CM | POA: Diagnosis not present

## 2020-06-09 DIAGNOSIS — N2581 Secondary hyperparathyroidism of renal origin: Secondary | ICD-10-CM | POA: Diagnosis not present

## 2020-06-09 DIAGNOSIS — D631 Anemia in chronic kidney disease: Secondary | ICD-10-CM | POA: Diagnosis not present

## 2020-06-09 DIAGNOSIS — D500 Iron deficiency anemia secondary to blood loss (chronic): Secondary | ICD-10-CM | POA: Diagnosis not present

## 2020-06-11 DIAGNOSIS — N2581 Secondary hyperparathyroidism of renal origin: Secondary | ICD-10-CM | POA: Diagnosis not present

## 2020-06-11 DIAGNOSIS — N186 End stage renal disease: Secondary | ICD-10-CM | POA: Diagnosis not present

## 2020-06-11 DIAGNOSIS — Z992 Dependence on renal dialysis: Secondary | ICD-10-CM | POA: Diagnosis not present

## 2020-06-11 DIAGNOSIS — D631 Anemia in chronic kidney disease: Secondary | ICD-10-CM | POA: Diagnosis not present

## 2020-06-11 DIAGNOSIS — D500 Iron deficiency anemia secondary to blood loss (chronic): Secondary | ICD-10-CM | POA: Diagnosis not present

## 2020-06-14 DIAGNOSIS — D631 Anemia in chronic kidney disease: Secondary | ICD-10-CM | POA: Diagnosis not present

## 2020-06-14 DIAGNOSIS — D500 Iron deficiency anemia secondary to blood loss (chronic): Secondary | ICD-10-CM | POA: Diagnosis not present

## 2020-06-14 DIAGNOSIS — N2581 Secondary hyperparathyroidism of renal origin: Secondary | ICD-10-CM | POA: Diagnosis not present

## 2020-06-14 DIAGNOSIS — Z992 Dependence on renal dialysis: Secondary | ICD-10-CM | POA: Diagnosis not present

## 2020-06-14 DIAGNOSIS — N186 End stage renal disease: Secondary | ICD-10-CM | POA: Diagnosis not present

## 2020-06-15 ENCOUNTER — Other Ambulatory Visit: Payer: Self-pay | Admitting: Family Medicine

## 2020-06-15 DIAGNOSIS — E785 Hyperlipidemia, unspecified: Secondary | ICD-10-CM

## 2020-06-16 DIAGNOSIS — N186 End stage renal disease: Secondary | ICD-10-CM | POA: Diagnosis not present

## 2020-06-16 DIAGNOSIS — N2581 Secondary hyperparathyroidism of renal origin: Secondary | ICD-10-CM | POA: Diagnosis not present

## 2020-06-16 DIAGNOSIS — D500 Iron deficiency anemia secondary to blood loss (chronic): Secondary | ICD-10-CM | POA: Diagnosis not present

## 2020-06-16 DIAGNOSIS — Z992 Dependence on renal dialysis: Secondary | ICD-10-CM | POA: Diagnosis not present

## 2020-06-16 DIAGNOSIS — D631 Anemia in chronic kidney disease: Secondary | ICD-10-CM | POA: Diagnosis not present

## 2020-06-18 DIAGNOSIS — D631 Anemia in chronic kidney disease: Secondary | ICD-10-CM | POA: Diagnosis not present

## 2020-06-18 DIAGNOSIS — Z992 Dependence on renal dialysis: Secondary | ICD-10-CM | POA: Diagnosis not present

## 2020-06-18 DIAGNOSIS — N186 End stage renal disease: Secondary | ICD-10-CM | POA: Diagnosis not present

## 2020-06-18 DIAGNOSIS — D500 Iron deficiency anemia secondary to blood loss (chronic): Secondary | ICD-10-CM | POA: Diagnosis not present

## 2020-06-18 DIAGNOSIS — N2581 Secondary hyperparathyroidism of renal origin: Secondary | ICD-10-CM | POA: Diagnosis not present

## 2020-06-20 DIAGNOSIS — N186 End stage renal disease: Secondary | ICD-10-CM | POA: Diagnosis not present

## 2020-06-20 DIAGNOSIS — Z992 Dependence on renal dialysis: Secondary | ICD-10-CM | POA: Diagnosis not present

## 2020-06-21 DIAGNOSIS — N186 End stage renal disease: Secondary | ICD-10-CM | POA: Diagnosis not present

## 2020-06-21 DIAGNOSIS — Z992 Dependence on renal dialysis: Secondary | ICD-10-CM | POA: Diagnosis not present

## 2020-06-21 DIAGNOSIS — D631 Anemia in chronic kidney disease: Secondary | ICD-10-CM | POA: Diagnosis not present

## 2020-06-21 DIAGNOSIS — D500 Iron deficiency anemia secondary to blood loss (chronic): Secondary | ICD-10-CM | POA: Diagnosis not present

## 2020-06-21 DIAGNOSIS — E8809 Other disorders of plasma-protein metabolism, not elsewhere classified: Secondary | ICD-10-CM | POA: Diagnosis not present

## 2020-06-21 DIAGNOSIS — N2581 Secondary hyperparathyroidism of renal origin: Secondary | ICD-10-CM | POA: Diagnosis not present

## 2020-06-22 ENCOUNTER — Ambulatory Visit (INDEPENDENT_AMBULATORY_CARE_PROVIDER_SITE_OTHER): Payer: Medicare Other | Admitting: Family Medicine

## 2020-06-22 ENCOUNTER — Other Ambulatory Visit: Payer: Self-pay

## 2020-06-22 ENCOUNTER — Encounter: Payer: Self-pay | Admitting: Family Medicine

## 2020-06-22 VITALS — BP 100/60 | HR 96 | Temp 98.6°F | Resp 16 | Ht 68.0 in | Wt 290.4 lb

## 2020-06-22 DIAGNOSIS — I10 Essential (primary) hypertension: Secondary | ICD-10-CM

## 2020-06-22 DIAGNOSIS — E785 Hyperlipidemia, unspecified: Secondary | ICD-10-CM | POA: Diagnosis not present

## 2020-06-22 DIAGNOSIS — N186 End stage renal disease: Secondary | ICD-10-CM

## 2020-06-22 DIAGNOSIS — Z992 Dependence on renal dialysis: Secondary | ICD-10-CM

## 2020-06-22 DIAGNOSIS — E1122 Type 2 diabetes mellitus with diabetic chronic kidney disease: Secondary | ICD-10-CM

## 2020-06-22 DIAGNOSIS — L989 Disorder of the skin and subcutaneous tissue, unspecified: Secondary | ICD-10-CM | POA: Diagnosis not present

## 2020-06-22 NOTE — Progress Notes (Signed)
Chief Complaint  Patient presents with  . spot on left arm  . Follow-up   HPI: Kent Osborne is a 69 y.o. male, who is here today with above concern, he is also due for follow-up.  Lesion noted about 2 to 3 months on left forearm.Around this time he had injury, hit forearm against metal storm door lash, lesion was noted after wound healed. He did not have skin lesion before trauma.  Raised thick,crusty lesion,it does not seem to be growing, it is tender upon palpation. He has not tried OTC medications.  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  He was last seen on 01/06/2020. No new problems since his last visit. He is having dialysis 3 times per week. Still having urine output but small "dribbles."  A1c was checked within the past 3 months and according to patient he was under 7. BS at home in the 130s. Negative for episodes of hypoglycemia. Currently he is on Tresiba 60 units daily and NovoLog 3 times per day with meals per sliding scale. Negative for polydipsia or polyphagia.  Peripheral neuropathy: He takes Amitriptyline 25 mg daily at bedtime.  Hypertension: Currently he is on Isosorbide mononitrate 60 mg daily, hydralazine 50 mg 3 times per day, and carvedilol 6.25 mg twice daily. He follows regularly with cardiologist. Negative for unusual/severe headache, visual changes, CP, dyspnea, palpitations, or worsening edema.  Lab Results  Component Value Date   CREATININE 7.94 (H) 05/25/2020   BUN 45 (H) 05/25/2020   NA 135 05/25/2020   K 5.0 05/25/2020   CL 91 (L) 05/25/2020   CO2 28 05/25/2020   HLD:He is on Simvastatin 40 mg daily.  Lab Results  Component Value Date   CHOL 98 08/10/2017   HDL 25.50 (L) 08/10/2017   LDLCALC 33 08/10/2017   LDLDIRECT 80.7 05/05/2010   TRIG 196.0 (H) 08/10/2017   CHOLHDL 4 08/10/2017    Health maintenance: He received flu vaccine at the dialysis center. Reporting having Tdap vaccine up to date, < 50 years ago.  Review  of Systems  Constitutional: Negative for activity change, appetite change and fever.  HENT: Negative for mouth sores, nosebleeds and sore throat.   Respiratory: Negative for cough and wheezing.   Gastrointestinal: Negative for abdominal pain, nausea and vomiting.  Neurological: Negative for syncope, facial asymmetry and weakness.  Rest see pertinent positives and negatives per HPI.  Current Outpatient Medications on File Prior to Visit  Medication Sig Dispense Refill  . amitriptyline (ELAVIL) 25 MG tablet Take 25 mg by mouth at bedtime.     Marland Kitchen aspirin EC 81 MG tablet Take 81 mg by mouth daily.    [name] 1 GM 210 MG(Fe) tablet     . calcium acetate (PHOSLO) 667 MG capsule Take 1 capsule (667 mg total) by mouth 3 (three) times daily with meals. 90 capsule 0  . carvedilol (COREG) 6.25 MG tablet TAKE 1 TABLET BY MOUTH TWICE DAILY WITH A MEAL 180 tablet 1  . Cholecalciferol (VITAMIN D) 2000 units tablet Take 2,000 Units by mouth daily.    . colchicine 0.6 MG tablet TAKE 2 TABLETS BY MOUTH AS SOON AS GOUT SYMPTOMS START 30 tablet 0  . Continuous Blood Gluc Receiver (DEXCOM G5 RECEIVER KIT) DEVI 1 kit by Does not apply route as directed. 1 Device 0  . glucose blood test strip USE 1 STRIP TO CHECK GLUCOSE THREE TIMES DAILY 300 each 3  . hydrALAZINE (APRESOLINE) 50 MG tablet Take  1 tablet (50 mg total) by mouth 3 (three) times daily. 90 tablet 2  . insulin degludec (TRESIBA FLEXTOUCH) 100 UNIT/ML FlexTouch Pen INJECT 60 UNITS SUB-Q ONCE DAILY 45 mL 3  . isosorbide mononitrate (IMDUR) 60 MG 24 hr tablet Take 1 tablet by mouth once daily 90 tablet 0  . lidocaine-prilocaine (EMLA) cream APPLY SMALL AMOUNT TO ACCESS SITE (AVF) 1 TO 2 HOURS BEFORE DIALYSIS. COVER WITH OCCLUSIVE DRESSING (SARAN WRAP)  12  . montelukast (SINGULAIR) 10 MG tablet Take 10 mg by mouth at bedtime.     . nitroGLYCERIN (NITROSTAT) 0.4 MG SL tablet Place 1 tablet (0.4 mg total) under the tongue every 5 (five) minutes as needed for  chest pain. 30 tablet 12  . NOVOLOG FLEXPEN 100 UNIT/ML FlexPen INJECT 2 TO 25 UNITS SUBCUTANEOUSLY 3 TIMES A DAY WITH MEALS PER SLIDING SCALE 45 mL 3  . Oxycodone HCl 10 MG TABS Take 10 mg by mouth 4 (four) times daily as needed (for pain).     . simvastatin (ZOCOR) 40 MG tablet TAKE 1 TABLET BY MOUTH AT BEDTIME 90 tablet 0  . tamsulosin (FLOMAX) 0.4 MG CAPS capsule Take 1 capsule (0.4 mg total) by mouth daily. 30 capsule 0   No current facility-administered medications on file prior to visit.   Past Medical History:  Diagnosis Date  . Acute renal failure superimposed on chronic kidney disease ([place])    [name] 11/01/2016  . Anemia   . Arthritis    "back, knees" (11/01/2016)  . Asthma   . CAD (coronary artery disease) 12/31/07   [facility] stents- placed in the circumflex and LAD  . Chronic bronchitis ([place])   . Hemodialysis status ([place])   . History of blood transfusion 10/30/2016   "related to anemia"  . History of gout   . History of kidney stones   . Hyperlipidemia   . Hypertension   . NSTEMI (non-ST elevated myocardial infarction) ([place]) 10/30/2016  . OA (osteoarthritis)   . Obese   . On home oxygen therapy    "2L w/CPAP" (11/01/2016)  . OSA on CPAP   . Psoriatic arthritis ([place])   . Type II diabetes mellitus (HCC)    Allergies  Allergen Reactions  . Olmesartan Other (See Comments)    Social History   Socioeconomic History  . Marital status: Divorced    Spouse name: Not on file  . Number of children: 3  . Years of education: 42  . Highest education level: High school graduate  Occupational History  . Not on file  Tobacco Use  . Smoking status: Never Smoker  . Smokeless tobacco: Never Used  Vaping Use  . Vaping Use: Never used  Substance and Sexual Activity  . Alcohol use: Not Currently    Comment: 11/01/2016 "nothing in years"  . Drug use: No  . Sexual activity: Yes  Other Topics Concern  . Not on file  Social History Narrative   Divorced   1 son and  2 daughters   2 grandsons   Retired Conservation officer, nature   Social Determinants of Radio broadcast assistant Strain: [place]   . Difficulty of Paying Living Expenses: Not very hard  Food Insecurity: No Food Insecurity  . Worried About Charity fundraiser in the Last Year: Never true  . Ran Out of Food in the Last Year: Never true  Transportation Needs:   . Lack of Transportation (Medical): Not on file  . Lack of Transportation (Non-Medical): Not on  file  Physical Activity: Inactive  . Days of Exercise per Week: 0 days  . Minutes of Exercise per Session: 0 min  Stress:   . Feeling of Stress : Not on file  Social Connections: Unknown  . Frequency of Communication with Friends and Family: More than three times a week  . Frequency of Social Gatherings with Friends and Family: More than three times a week  . Attends Religious Services: Not on file  . Active Member of Clubs or Organizations: Not on file  . Attends Archivist Meetings: Not on file  . Marital Status: Divorced   Vitals:   06/22/20 1119 06/22/20 1149  BP: 100/60   Pulse: (!) 101 96  Resp: 16   Temp: 98.6 F (37 C)   SpO2: 98%    Body mass index is 44.15 kg/m.  Physical Exam Vitals and nursing note reviewed.  Constitutional:      General: He is not in acute distress.    Appearance: He is well-developed.  HENT:     Head: Normocephalic and atraumatic.     Mouth/Throat:     Pharynx: Oropharynx is clear.  Eyes:     Conjunctiva/sclera: Conjunctivae normal.  Cardiovascular:     Rate and Rhythm: Normal rate and regular rhythm.     Comments: PT pulses present. Pulmonary:     Effort: Pulmonary effort is normal. No respiratory distress.     Breath sounds: Normal breath sounds.  Abdominal:     Palpations: Abdomen is soft. There is no mass.  Musculoskeletal:     Right lower leg: 1+ Pitting Edema present.     Left lower leg: 1+ Pitting Edema present.  Lymphadenopathy:     Cervical: No cervical adenopathy.    Skin:    General: Skin is warm.     Findings: Lesion present. No rash.       Neurological:     Mental Status: He is alert and oriented to person, place, and time.     Cranial Nerves: No cranial nerve deficit.     Gait: Gait normal.  Psychiatric:     Comments: Well groomed, good eye contact.      ASSESSMENT AND PLAN:  Kent Osborne was seen today for spot on left arm and follow-up.  Diagnoses and all orders for this visit: Orders Placed This Encounter  Procedures  . Ambulatory referral to Dermatology   Skin lesion I think lesion needs to be evaluated for SCC. Appt with dermatologist will be arranged.  Type 2 diabetes mellitus with chronic kidney disease on chronic dialysis, without long-term current use of insulin ([place]) HgA1C is appropriate. Continue Tressiba 60 U daily and Novolog per sliding scale. Avoidance of added sugar food intake and concentrated sweets. Annual eye exam and foot care recommended. F/U in 5-6 months  Essential hypertension BP on lower normal range. Monitor BP at home in between dialysis days, meds may need to be adjusted. Continue Imdur 60 mg daily,Hydralazine 50 mg tid,and Carvedilol 6.25 mg bid.  Hyperlipidemia, unspecified hyperlipidemia type He is not fasting today. Last LDL at goal, it was 33 in 07/2017. Continue Simvastatin 40 mg daily.  Return in about 6 months (around 12/20/2020).   Wanell Lorenzi G. [place], MD  [facility]. [place] office.   A few things to remember from today's visit:  No changes today.  If you need refills please call your pharmacy. Do not use My Chart to request refills or for acute issues that need immediate attention.  Please be sure medication list is accurate. If a new problem present, please set up appointment sooner than planned today.

## 2020-06-22 NOTE — Patient Instructions (Signed)
A few things to remember from today's visit:  No changes today.  If you need refills please call your pharmacy. Do not use My Chart to request refills or for acute issues that need immediate attention.    Please be sure medication list is accurate. If a new problem present, please set up appointment sooner than planned today.

## 2020-06-23 DIAGNOSIS — D631 Anemia in chronic kidney disease: Secondary | ICD-10-CM | POA: Diagnosis not present

## 2020-06-23 DIAGNOSIS — Z992 Dependence on renal dialysis: Secondary | ICD-10-CM | POA: Diagnosis not present

## 2020-06-23 DIAGNOSIS — D500 Iron deficiency anemia secondary to blood loss (chronic): Secondary | ICD-10-CM | POA: Diagnosis not present

## 2020-06-23 DIAGNOSIS — N2581 Secondary hyperparathyroidism of renal origin: Secondary | ICD-10-CM | POA: Diagnosis not present

## 2020-06-23 DIAGNOSIS — N186 End stage renal disease: Secondary | ICD-10-CM | POA: Diagnosis not present

## 2020-06-23 DIAGNOSIS — E8809 Other disorders of plasma-protein metabolism, not elsewhere classified: Secondary | ICD-10-CM | POA: Diagnosis not present

## 2020-06-25 DIAGNOSIS — E8809 Other disorders of plasma-protein metabolism, not elsewhere classified: Secondary | ICD-10-CM | POA: Diagnosis not present

## 2020-06-25 DIAGNOSIS — D631 Anemia in chronic kidney disease: Secondary | ICD-10-CM | POA: Diagnosis not present

## 2020-06-25 DIAGNOSIS — N186 End stage renal disease: Secondary | ICD-10-CM | POA: Diagnosis not present

## 2020-06-25 DIAGNOSIS — N2581 Secondary hyperparathyroidism of renal origin: Secondary | ICD-10-CM | POA: Diagnosis not present

## 2020-06-25 DIAGNOSIS — Z992 Dependence on renal dialysis: Secondary | ICD-10-CM | POA: Diagnosis not present

## 2020-06-25 DIAGNOSIS — D500 Iron deficiency anemia secondary to blood loss (chronic): Secondary | ICD-10-CM | POA: Diagnosis not present

## 2020-06-28 DIAGNOSIS — N2581 Secondary hyperparathyroidism of renal origin: Secondary | ICD-10-CM | POA: Diagnosis not present

## 2020-06-28 DIAGNOSIS — N186 End stage renal disease: Secondary | ICD-10-CM | POA: Diagnosis not present

## 2020-06-28 DIAGNOSIS — D631 Anemia in chronic kidney disease: Secondary | ICD-10-CM | POA: Diagnosis not present

## 2020-06-28 DIAGNOSIS — D500 Iron deficiency anemia secondary to blood loss (chronic): Secondary | ICD-10-CM | POA: Diagnosis not present

## 2020-06-28 DIAGNOSIS — E8809 Other disorders of plasma-protein metabolism, not elsewhere classified: Secondary | ICD-10-CM | POA: Diagnosis not present

## 2020-06-28 DIAGNOSIS — Z992 Dependence on renal dialysis: Secondary | ICD-10-CM | POA: Diagnosis not present

## 2020-06-30 DIAGNOSIS — Z992 Dependence on renal dialysis: Secondary | ICD-10-CM | POA: Diagnosis not present

## 2020-06-30 DIAGNOSIS — E8809 Other disorders of plasma-protein metabolism, not elsewhere classified: Secondary | ICD-10-CM | POA: Diagnosis not present

## 2020-06-30 DIAGNOSIS — N186 End stage renal disease: Secondary | ICD-10-CM | POA: Diagnosis not present

## 2020-06-30 DIAGNOSIS — D631 Anemia in chronic kidney disease: Secondary | ICD-10-CM | POA: Diagnosis not present

## 2020-06-30 DIAGNOSIS — D500 Iron deficiency anemia secondary to blood loss (chronic): Secondary | ICD-10-CM | POA: Diagnosis not present

## 2020-06-30 DIAGNOSIS — N2581 Secondary hyperparathyroidism of renal origin: Secondary | ICD-10-CM | POA: Diagnosis not present

## 2020-07-02 DIAGNOSIS — Z992 Dependence on renal dialysis: Secondary | ICD-10-CM | POA: Diagnosis not present

## 2020-07-02 DIAGNOSIS — D500 Iron deficiency anemia secondary to blood loss (chronic): Secondary | ICD-10-CM | POA: Diagnosis not present

## 2020-07-02 DIAGNOSIS — N186 End stage renal disease: Secondary | ICD-10-CM | POA: Diagnosis not present

## 2020-07-02 DIAGNOSIS — D631 Anemia in chronic kidney disease: Secondary | ICD-10-CM | POA: Diagnosis not present

## 2020-07-02 DIAGNOSIS — E8809 Other disorders of plasma-protein metabolism, not elsewhere classified: Secondary | ICD-10-CM | POA: Diagnosis not present

## 2020-07-02 DIAGNOSIS — N2581 Secondary hyperparathyroidism of renal origin: Secondary | ICD-10-CM | POA: Diagnosis not present

## 2020-07-05 DIAGNOSIS — N2581 Secondary hyperparathyroidism of renal origin: Secondary | ICD-10-CM | POA: Diagnosis not present

## 2020-07-05 DIAGNOSIS — E8809 Other disorders of plasma-protein metabolism, not elsewhere classified: Secondary | ICD-10-CM | POA: Diagnosis not present

## 2020-07-05 DIAGNOSIS — D631 Anemia in chronic kidney disease: Secondary | ICD-10-CM | POA: Diagnosis not present

## 2020-07-05 DIAGNOSIS — D500 Iron deficiency anemia secondary to blood loss (chronic): Secondary | ICD-10-CM | POA: Diagnosis not present

## 2020-07-05 DIAGNOSIS — Z992 Dependence on renal dialysis: Secondary | ICD-10-CM | POA: Diagnosis not present

## 2020-07-05 DIAGNOSIS — N186 End stage renal disease: Secondary | ICD-10-CM | POA: Diagnosis not present

## 2020-07-06 ENCOUNTER — Encounter (HOSPITAL_COMMUNITY): Payer: Self-pay

## 2020-07-06 ENCOUNTER — Emergency Department (HOSPITAL_COMMUNITY)
Admission: EM | Admit: 2020-07-06 | Discharge: 2020-07-06 | Disposition: A | Discharge status: Home / Self Care | Payer: Medicare Other | Attending: Emergency Medicine | Admitting: Emergency Medicine

## 2020-07-06 ENCOUNTER — Other Ambulatory Visit: Payer: Self-pay

## 2020-07-06 DIAGNOSIS — N186 End stage renal disease: Secondary | ICD-10-CM | POA: Insufficient documentation

## 2020-07-06 DIAGNOSIS — I132 Hypertensive heart and chronic kidney disease with heart failure and with stage 5 chronic kidney disease, or end stage renal disease: Secondary | ICD-10-CM | POA: Diagnosis not present

## 2020-07-06 DIAGNOSIS — Z794 Long term (current) use of insulin: Secondary | ICD-10-CM | POA: Insufficient documentation

## 2020-07-06 DIAGNOSIS — Z7982 Long term (current) use of aspirin: Secondary | ICD-10-CM | POA: Diagnosis not present

## 2020-07-06 DIAGNOSIS — U071 COVID-19: Secondary | ICD-10-CM | POA: Insufficient documentation

## 2020-07-06 DIAGNOSIS — I509 Heart failure, unspecified: Secondary | ICD-10-CM | POA: Diagnosis not present

## 2020-07-06 DIAGNOSIS — Z79899 Other long term (current) drug therapy: Secondary | ICD-10-CM | POA: Diagnosis not present

## 2020-07-06 DIAGNOSIS — I2510 Atherosclerotic heart disease of native coronary artery without angina pectoris: Secondary | ICD-10-CM | POA: Diagnosis not present

## 2020-07-06 DIAGNOSIS — E1122 Type 2 diabetes mellitus with diabetic chronic kidney disease: Secondary | ICD-10-CM | POA: Diagnosis not present

## 2020-07-06 DIAGNOSIS — J449 Chronic obstructive pulmonary disease, unspecified: Secondary | ICD-10-CM | POA: Insufficient documentation

## 2020-07-06 DIAGNOSIS — E1140 Type 2 diabetes mellitus with diabetic neuropathy, unspecified: Secondary | ICD-10-CM | POA: Insufficient documentation

## 2020-07-06 DIAGNOSIS — R197 Diarrhea, unspecified: Secondary | ICD-10-CM | POA: Diagnosis not present

## 2020-07-06 DIAGNOSIS — J45909 Unspecified asthma, uncomplicated: Secondary | ICD-10-CM | POA: Insufficient documentation

## 2020-07-06 LAB — RESP PANEL BY RT PCR (RSV, FLU A&B, COVID)
Influenza A by PCR: NEGATIVE
Influenza B by PCR: NEGATIVE
Respiratory Syncytial Virus by PCR: NEGATIVE
SARS Coronavirus 2 by RT PCR: POSITIVE — AB

## 2020-07-06 LAB — COMPREHENSIVE METABOLIC PANEL
ALT: 31 U/L (ref 0–44)
AST: 46 U/L — ABNORMAL HIGH (ref 15–41)
Albumin: 3.1 g/dL — ABNORMAL LOW (ref 3.5–5.0)
Alkaline Phosphatase: 36 U/L — ABNORMAL LOW (ref 38–126)
Anion gap: 12 (ref 5–15)
BUN: 42 mg/dL — ABNORMAL HIGH (ref 8–23)
CO2: 31 mmol/L (ref 22–32)
Calcium: 8.2 mg/dL — ABNORMAL LOW (ref 8.9–10.3)
Chloride: 92 mmol/L — ABNORMAL LOW (ref 98–111)
Creatinine, Ser: 6.75 mg/dL — ABNORMAL HIGH (ref 0.61–1.24)
GFR, Estimated: 8 mL/min — ABNORMAL LOW (ref >60)
Glucose, Bld: 86 mg/dL (ref 70–99)
Potassium: 4.7 mmol/L (ref 3.5–5.1)
Sodium: 135 mmol/L (ref 135–145)
Total Bilirubin: 0.6 mg/dL (ref 0.3–1.2)
Total Protein: 7 g/dL (ref 6.5–8.1)

## 2020-07-06 LAB — CBC
HCT: 33.3 % — ABNORMAL LOW (ref 39.0–52.0)
Hemoglobin: 10.3 g/dL — ABNORMAL LOW (ref 13.0–17.0)
MCH: 32.6 pg (ref 26.0–34.0)
MCHC: 30.9 g/dL (ref 30.0–36.0)
MCV: 105.4 fL — ABNORMAL HIGH (ref 80.0–100.0)
Platelets: 153 10*3/uL (ref 150–400)
RBC: 3.16 MIL/uL — ABNORMAL LOW (ref 4.22–5.81)
RDW: 13.4 % (ref 11.5–15.5)
WBC: 5.9 10*3/uL (ref 4.0–10.5)
nRBC: 0 % (ref 0.0–0.2)

## 2020-07-06 LAB — CBG MONITORING, ED: Glucose-Capillary: 83 mg/dL (ref 70–99)

## 2020-07-06 LAB — LIPASE, BLOOD: Lipase: 47 U/L (ref 11–51)

## 2020-07-06 MED ORDER — SODIUM CHLORIDE 0.9 % IV SOLN: INTRAVENOUS | Status: DC | PRN

## 2020-07-06 MED ORDER — METHYLPREDNISOLONE SODIUM SUCC 125 MG IJ SOLR: 125.0000 mg | Freq: Once | INTRAMUSCULAR | Status: DC | PRN

## 2020-07-06 MED ORDER — DIPHENHYDRAMINE HCL 50 MG/ML IJ SOLN: 50.0000 mg | Freq: Once | INTRAMUSCULAR | Status: DC | PRN

## 2020-07-06 MED ORDER — ALBUTEROL SULFATE HFA 108 (90 BASE) MCG/ACT IN AERS: 2.0000 | INHALATION_SPRAY | Freq: Once | RESPIRATORY_TRACT | Status: DC | PRN

## 2020-07-06 MED ORDER — SODIUM CHLORIDE 0.9 % IV BOLUS
250.0000 mL | Freq: Once | INTRAVENOUS | Status: AC
  Administered 2020-07-06: 250 mL via INTRAVENOUS

## 2020-07-06 MED ORDER — DIPHENOXYLATE-ATROPINE 2.5-0.025 MG PO TABS: 1.0000 | ORAL_TABLET | Freq: Four times a day (QID) | ORAL | 0 refills | Status: DC | PRN

## 2020-07-06 MED ORDER — SODIUM CHLORIDE 0.9 % IV SOLN
1200.0000 mg | Freq: Once | INTRAVENOUS | Status: AC
  Administered 2020-07-06: 1200 mg via INTRAVENOUS
  Filled 2020-07-06: qty 10

## 2020-07-06 MED ORDER — ONDANSETRON 4 MG PO TBDP: 4.0000 mg | ORAL_TABLET | Freq: Three times a day (TID) | ORAL | 0 refills | Status: DC | PRN

## 2020-07-06 MED ORDER — ACETAMINOPHEN 500 MG PO TABS
1000.0000 mg | ORAL_TABLET | Freq: Once | ORAL | Status: AC
  Administered 2020-07-06: 1000 mg via ORAL
  Filled 2020-07-06: qty 2

## 2020-07-06 MED ORDER — EPINEPHRINE 0.3 MG/0.3ML IJ SOAJ: 0.3000 mg | Freq: Once | INTRAMUSCULAR | Status: DC | PRN

## 2020-07-06 MED ORDER — FAMOTIDINE IN NACL 20-0.9 MG/50ML-% IV SOLN: 20.0000 mg | Freq: Once | INTRAVENOUS | Status: DC | PRN

## 2020-07-06 MED ORDER — ONDANSETRON HCL 4 MG/2ML IJ SOLN
4.0000 mg | Freq: Once | INTRAMUSCULAR | Status: AC
  Administered 2020-07-06: 4 mg via INTRAVENOUS
  Filled 2020-07-06: qty 2

## 2020-07-06 NOTE — ED Provider Notes (Signed)
[facility] EMERGENCY DEPARTMENT Provider Note   CSN: [id number] Arrival date & time: 07/06/20  0945     History Chief Complaint  Patient presents with  . Diarrhea    Kent Osborne is a 69 y.o. male with a history of ESRD on dialysis (last ytd), type II DM, COPD, CAD, sleep apnea, HTN presenting with complaint of a one week history of diarrhea.  He recently had a COPD flare including cough and congestion which has improved after completing a course of prednisone, but has now developed multiple episodes of brown liquid, non bloody stools daily x 1 week.  He also endorses nausea without emesis, denies abdominal pain, also no chest pain, sob or fever symptoms.  He is fully vaccinated for covid.  He denies any recent antibiotics.  He has taken imodium yesterday and again today without relief of sx.   HPI     Past Medical History:  Diagnosis Date  . Acute renal failure superimposed on chronic kidney disease ([place])    [name] 11/01/2016  . Anemia   . Arthritis    "back, knees" (11/01/2016)  . Asthma   . CAD (coronary artery disease) 12/31/07   [facility] stents- placed in the circumflex and LAD  . Chronic bronchitis ([place])   . Hemodialysis status ([place])   . History of blood transfusion 10/30/2016   "related to anemia"  . History of gout   . History of kidney stones   . Hyperlipidemia   . Hypertension   . NSTEMI (non-ST elevated myocardial infarction) ([place]) 10/30/2016  . OA (osteoarthritis)   . Obese   . On home oxygen therapy    "2L w/CPAP" (11/01/2016)  . OSA on CPAP   . Psoriatic arthritis ([place])   . Type II diabetes mellitus [facility])     Patient Active Problem List   Diagnosis Date Noted  . Heart failure, unspecified HF chronicity, unspecified heart failure type ([place]) 08/26/2019  . Type II diabetes mellitus with renal manifestations ([place]) 03/01/2018  . GERD (gastroesophageal reflux disease) 03/01/2018  . Depression 03/01/2018  . Urinary retention 03/01/2018  .  Normocytic anemia 03/01/2018  . BPH associated with nocturia 12/24/2017  . Elevated troponin 11/01/2016  . NSTEMI (non-ST elevated myocardial infarction) ([place]) 11/01/2016  . Acute renal failure superimposed on stage 4 chronic kidney disease ([place]) 11/01/2016  . Diabetes mellitus with complication ([place]) 14/43/1540  . COPD (chronic obstructive pulmonary disease) ([place]) 11/01/2016  . OSA (obstructive sleep apnea) 11/01/2016  . Symptomatic anemia 10/30/2016  . Morbid obesity ([place]) 04/29/2016  . Gout, arthropathy 04/27/2016  . Diabetic peripheral neuropathy associated with type 2 diabetes mellitus ([place]) 08/20/2014  . Type II diabetes mellitus with circulatory disorder, uncontrolled 07/09/2014  . Renal insufficiency 02/29/2012  . HEMATURIA 07/27/2010  . GOUT, UNSPECIFIED 07/05/2010  . BACK PAIN, LUMBAR 04/18/2010  . Backache 01/06/2010  . GENERALIZED OSTEOARTHROSIS INVOLVING HAND 07/20/2009  . Chronic kidney disease (CKD), stage IV (severe) ([place]) 04/12/2009  . PERIPHERAL EDEMA 12/31/2008  . Asthma with acute exacerbation 10/08/2008  . Osteoarthritis 07/28/2008  . Hyperlipemia 01/06/2008  . Coronary atherosclerosis 01/06/2008  . Chronic ischemic heart disease 12/31/2007  . Essential hypertension 02/21/2007  . CHOLELITHIASIS 02/21/2007  . NEPHROLITHIASIS, HX OF 02/21/2007    Past Surgical History:  Procedure Laterality Date  . AV FISTULA PLACEMENT    . CORONARY ANGIOPLASTY WITH STENT PLACEMENT  2009   "3 stents"  . CYSTOSCOPY WITH URETEROSCOPY AND STENT PLACEMENT Bilateral 04/23/2018   Procedure: CYSTOSCOPY WITH BLADDER  BIOPSY BILATERAL RETROGRADE PYELOGRAM LEFT URETEROSCOPY WITH BIOPSY AND LEFT URETERAL  STENT PLACEMENT;  Surgeon: Festus Aloe, MD;  Location: [facility];  Service: Urology;  Laterality: Bilateral;       Family History  Problem Relation Age of Onset  . Diabetes Other   . Hyperlipidemia Other   . Hypertension Other     Social History   Tobacco Use  . Smoking  status: Never Smoker  . Smokeless tobacco: Never Used  Vaping Use  . Vaping Use: Never used  Substance Use Topics  . Alcohol use: Not Currently    Comment: 11/01/2016 "nothing in years"  . Drug use: No    Home Medications Prior to Admission medications   Medication Sig Start Date End Date Taking? Authorizing Provider  amitriptyline (ELAVIL) 25 MG tablet Take 25 mg by mouth at bedtime.     [provider]  aspirin EC 81 MG tablet Take 81 mg by mouth daily.    [provider]  AURYXIA 1 GM 210 MG(Fe) tablet  05/27/18   [provider]  calcium acetate (PHOSLO) 667 MG capsule Take 1 capsule (667 mg total) by mouth 3 (three) times daily with meals. 03/04/18   Florencia Reasons, MD  carvedilol (COREG) 6.25 MG tablet TAKE 1 TABLET BY MOUTH TWICE DAILY WITH A MEAL 04/06/20   [place], Betty G, MD  Cholecalciferol (VITAMIN D) 2000 units tablet Take 2,000 Units by mouth daily.    [provider]  colchicine 0.6 MG tablet TAKE 2 TABLETS BY MOUTH AS SOON AS GOUT SYMPTOMS START 09/05/19   [place], Betty G, MD  Continuous Blood Gluc Receiver (DEXCOM G5 RECEIVER KIT) DEVI 1 kit by Does not apply route as directed. 01/06/20   [place], Betty G, MD  diphenoxylate-atropine (LOMOTIL) 2.5-0.025 MG tablet Take 1 tablet by mouth 4 (four) times daily as needed for diarrhea or loose stools. 07/06/20   Evalee Jefferson, PA-C  glucose blood test strip USE 1 STRIP TO CHECK GLUCOSE THREE TIMES DAILY 04/27/20   [place], Betty G, MD  hydrALAZINE (APRESOLINE) 50 MG tablet Take 1 tablet (50 mg total) by mouth 3 (three) times daily. 03/09/17   [place], Betty G, MD  insulin degludec (TRESIBA FLEXTOUCH) 100 UNIT/ML FlexTouch Pen INJECT 60 UNITS SUB-Q ONCE DAILY 01/16/20   [place], Betty G, MD  isosorbide mononitrate (IMDUR) 60 MG 24 hr tablet Take 1 tablet by mouth once daily 08/21/19   [place], Betty G, MD  lidocaine-prilocaine (EMLA) cream APPLY SMALL AMOUNT TO ACCESS SITE (AVF) 1 TO 2 HOURS BEFORE DIALYSIS. COVER WITH  OCCLUSIVE DRESSING (SARAN WRAP) 05/20/18   [provider]  montelukast (SINGULAIR) 10 MG tablet Take 10 mg by mouth at bedtime.     [provider]  nitroGLYCERIN (NITROSTAT) 0.4 MG SL tablet Place 1 tablet (0.4 mg total) under the tongue every 5 (five) minutes as needed for chest pain. 11/03/16   Rai, Ripudeep K, MD  NOVOLOG FLEXPEN 100 UNIT/ML FlexPen INJECT 2 TO 25 UNITS SUBCUTANEOUSLY 3 TIMES A DAY WITH MEALS PER SLIDING SCALE 08/26/19   [place], Betty G, MD  ondansetron (ZOFRAN ODT) 4 MG disintegrating tablet Take 1 tablet (4 mg total) by mouth every 8 (eight) hours as needed for nausea or vomiting. 07/06/20   Evalee Jefferson, PA-C  Oxycodone HCl 10 MG TABS Take 10 mg by mouth 4 (four) times daily as needed (for pain).     Arcedo, Perico, DO  simvastatin (ZOCOR) 40 MG tablet TAKE 1 TABLET BY MOUTH  AT BEDTIME 06/15/20   Martin[place], Betty G, MD  tamsulosin (FLOMAX) 0.4 MG CAPS capsule Take 1 capsule (0.4 mg total) by mouth daily. 03/05/18   Florencia Reasons, MD    Allergies    Olmesartan  Review of Systems   Review of Systems  Constitutional: Negative for chills and fever.  HENT: Negative for congestion and sore throat.   Eyes: Negative.   Respiratory: Negative for chest tightness and shortness of breath.   Cardiovascular: Negative for chest pain.  Gastrointestinal: Positive for diarrhea and nausea. Negative for abdominal pain and vomiting.  Genitourinary: Negative.        He does not make urine.  Musculoskeletal: Negative for arthralgias, joint swelling and neck pain.  Skin: Negative.  Negative for rash and wound.  Neurological: Negative for dizziness, weakness, light-headedness, numbness and headaches.  Psychiatric/Behavioral: Negative.     Physical Exam Updated Vital Signs BP (!) 105/59   Pulse 67   Temp 98.4 F (36.9 C) (Oral)   Resp 19   SpO2 94%   Physical Exam Vitals and nursing note reviewed.  Constitutional:      General: He is not in acute distress.    Appearance:  He is well-developed. He is obese. He is not ill-appearing, toxic-appearing or diaphoretic.  HENT:     Head: Normocephalic and atraumatic.     Mouth/Throat:     Pharynx: Oropharynx is clear.  Eyes:     Conjunctiva/sclera: Conjunctivae normal.  Cardiovascular:     Rate and Rhythm: Normal rate and regular rhythm.     Heart sounds: Normal heart sounds.  Pulmonary:     Effort: Pulmonary effort is normal.     Breath sounds: Normal breath sounds. No wheezing.  Abdominal:     General: Abdomen is protuberant. Bowel sounds are normal.     Palpations: Abdomen is soft.     Tenderness: There is no abdominal tenderness. There is no guarding or rebound.  Musculoskeletal:        General: Normal range of motion.     Cervical back: Normal range of motion.  Skin:    General: Skin is warm and dry.  Neurological:     Mental Status: He is alert.     ED Results / Procedures / Treatments   Labs (all labs ordered are listed, but only abnormal results are displayed) Labs Reviewed  RESP PANEL BY RT PCR (RSV, FLU A&B, COVID) - Abnormal; Notable for the following components:      Result Value   SARS Coronavirus 2 by RT PCR POSITIVE (*)    All other components within normal limits  COMPREHENSIVE METABOLIC PANEL - Abnormal; Notable for the following components:   Chloride 92 (*)    BUN 42 (*)    Creatinine, Ser 6.75 (*)    Calcium 8.2 (*)    Albumin 3.1 (*)    AST 46 (*)    Alkaline Phosphatase 36 (*)    GFR, Estimated 8 (*)    All other components within normal limits  CBC - Abnormal; Notable for the following components:   RBC 3.16 (*)    Hemoglobin 10.3 (*)    HCT 33.3 (*)    MCV 105.4 (*)    All other components within normal limits  LIPASE, BLOOD  CBG MONITORING, ED    EKG None  Radiology No results found.  Procedures Procedures (including critical care time)  Medications Ordered in ED Medications  sodium chloride 0.9 % bolus 250 mL (0 mLs Intravenous  Stopped 07/06/20 1247)    ondansetron (ZOFRAN) injection 4 mg (4 mg Intravenous Given 07/06/20 1442)  casirivimab-imdevimab (REGEN-COV) 1,200 mg in sodium chloride 0.9 % 110 mL IVPB (0 mg Intravenous Stopped 07/06/20 1545)  acetaminophen (TYLENOL) tablet 1,000 mg (1,000 mg Oral Given 07/06/20 1516)    ED Course  I have reviewed the triage vital signs and the nursing notes.  Pertinent labs & imaging results that were available during my care of the patient were reviewed by me and considered in my medical decision making (see chart for details).    MDM Rules/Calculators/A&P                          Pt with h/o bronchitis flare last week, resolved, now with diarrhea. He tests positive for covid today.  He has had no diarrhea while in the dept, suspect sx are covid related.  He is a candidate for monoclonal antibody tx and is agreeable to receive this tx.  His sx of diarrhea x 7 days, has dialysis in am, pt tx here to ensure he receives this tx.  He was given covid info including quarantine issues and need for close f/u for any new or worsened sx.  No sob, normal respiratory exam.  He endorses that his dialysis center (in [place]) is set up for covid + patients.  Advised to notify them today of his diagnosis. Pt understands and agrees with plan.  Stable at time of dc.  Kent Osborne was evaluated in Emergency Department on 07/07/2020 for the symptoms described in the history of present illness. He was evaluated in the context of the global COVID-19 pandemic, which necessitated consideration that the patient might be at risk for infection with the SARS-CoV-2 virus that causes COVID-19. Institutional protocols and algorithms that pertain to the evaluation of patients at risk for COVID-19 are in a state of rapid change based on information released by regulatory bodies including the CDC and federal and state organizations. These policies and algorithms were followed during the patient's care in the ED.   Final Clinical  Impression(s) / ED Diagnoses Final diagnoses:  COVID-19  Diarrhea in adult patient    Rx / DC Orders ED Discharge Orders         Ordered    ondansetron (ZOFRAN ODT) 4 MG disintegrating tablet  Every 8 hours PRN        07/06/20 1640    diphenoxylate-atropine (LOMOTIL) 2.5-0.025 MG tablet  4 times daily PRN        07/06/20 1640           Evalee Jefferson, PA-C 07/07/20 0730    Milton Ferguson, MD 07/09/20 [phone]

## 2020-07-06 NOTE — ED Notes (Signed)
CRITICAL VALUE ALERT  Critical Value:  covid positive  Date & Time Notied:  07/06/20 1315  Provider Notified: Almyra Free idol,PA  Orders Received/Actions taken: PA notified

## 2020-07-06 NOTE — Discharge Instructions (Addendum)
You will need to maintain home quarantine (except for your dialysis, but you will need to let them know you have tested positive for Covid) for a total of 10 days from the onset of symptoms.  However you will also need to extend this for 3 days beyond your last documented fever if this symptom persists.  Rest and use the medicines prescribed as needed for symptom relief.  You have received the monoclonial antibody treatment to help you get over this illness easier.  However, for any increased weakness or development of any shortness of breath, you may require re-evaluation for possible admission if you do not do well at home.  Treat your fever with tylenol.

## 2020-07-06 NOTE — ED Notes (Signed)
No stool, pt aware of need.  Hat in commode for stool collection.

## 2020-07-06 NOTE — Progress Notes (Signed)
Pharmacy COVID-19 Monoclonal Antibody Screening  [name] was identified as being not hospitalized with symptoms from Covid-19 on admission but an incidental positive PCR has been documented.  The patient may qualify for the use of monoclonal antibodies (mAB) for COVID-19 viral infection to prevent worsening symptoms stemming from Covid-19 infection.  The patient was identified based on a positive COVID-19 PCR and not requiring the use of supplemental oxygen at this time.  This patient meets the FDA criteria for Emergency Use Authorization of casirivimab/imdevimab or bamlanivimab/etesevimab.  Has a (+) direct SARS-CoV-2 viral test result  Is NOT hospitalized due to COVID-19  Is within 10 days of symptom onset  Has at least one of the high risk factor(s) for progression to severe COVID-19 and/or hospitalization as defined in EUA.  Specific high risk criteria : Older age (>/= 67 yo), BMI > 25 and Diabetes  Additionally: The patient has had a positive COVID-19 PCR in the last 90 days.  The patient is fully vaccinated against COVID-19.  Since the patient is unvaccinated and meets high risk criteria, the patient is eligible for mAB administration.      Plan: Based on the above discussion, it was decided that the patient will receive one dose of the available COVID-19 mAB combination. Pharmacy will coordinate administration timing with patient's nurse. Recommended infusion monitoring parameters communicated to the nursing team.   Ramond Craver 07/06/2020  2:46 PM

## 2020-07-06 NOTE — ED Triage Notes (Signed)
Pt presents to ED with complaints of nausea and diarrhea x 1 week, denies vomiting. Pt is a dialysis patient. Pt states he has not had any vomiting. Pt states at the end of his treatment yesterday he had chills but not fever.

## 2020-07-07 DIAGNOSIS — J189 Pneumonia, unspecified organism: Secondary | ICD-10-CM | POA: Diagnosis not present

## 2020-07-07 DIAGNOSIS — I132 Hypertensive heart and chronic kidney disease with heart failure and with stage 5 chronic kidney disease, or end stage renal disease: Secondary | ICD-10-CM | POA: Diagnosis not present

## 2020-07-07 DIAGNOSIS — J9611 Chronic respiratory failure with hypoxia: Secondary | ICD-10-CM | POA: Diagnosis not present

## 2020-07-07 DIAGNOSIS — E1165 Type 2 diabetes mellitus with hyperglycemia: Secondary | ICD-10-CM | POA: Diagnosis present

## 2020-07-07 DIAGNOSIS — I10 Essential (primary) hypertension: Secondary | ICD-10-CM | POA: Diagnosis not present

## 2020-07-07 DIAGNOSIS — J9621 Acute and chronic respiratory failure with hypoxia: Secondary | ICD-10-CM | POA: Diagnosis not present

## 2020-07-07 DIAGNOSIS — Z9981 Dependence on supplemental oxygen: Secondary | ICD-10-CM | POA: Diagnosis not present

## 2020-07-07 DIAGNOSIS — R0902 Hypoxemia: Secondary | ICD-10-CM | POA: Diagnosis not present

## 2020-07-07 DIAGNOSIS — Z955 Presence of coronary angioplasty implant and graft: Secondary | ICD-10-CM | POA: Diagnosis not present

## 2020-07-07 DIAGNOSIS — I5032 Chronic diastolic (congestive) heart failure: Secondary | ICD-10-CM | POA: Diagnosis not present

## 2020-07-07 DIAGNOSIS — J1282 Pneumonia due to coronavirus disease 2019: Secondary | ICD-10-CM | POA: Diagnosis present

## 2020-07-07 DIAGNOSIS — Z6841 Body Mass Index (BMI) 40.0 and over, adult: Secondary | ICD-10-CM | POA: Diagnosis not present

## 2020-07-07 DIAGNOSIS — E1122 Type 2 diabetes mellitus with diabetic chronic kidney disease: Secondary | ICD-10-CM | POA: Diagnosis present

## 2020-07-07 DIAGNOSIS — R531 Weakness: Secondary | ICD-10-CM | POA: Diagnosis not present

## 2020-07-07 DIAGNOSIS — U071 COVID-19: Secondary | ICD-10-CM | POA: Diagnosis not present

## 2020-07-07 DIAGNOSIS — I4891 Unspecified atrial fibrillation: Secondary | ICD-10-CM | POA: Diagnosis not present

## 2020-07-07 DIAGNOSIS — I2510 Atherosclerotic heart disease of native coronary artery without angina pectoris: Secondary | ICD-10-CM | POA: Diagnosis present

## 2020-07-07 DIAGNOSIS — E785 Hyperlipidemia, unspecified: Secondary | ICD-10-CM | POA: Diagnosis present

## 2020-07-07 DIAGNOSIS — G4733 Obstructive sleep apnea (adult) (pediatric): Secondary | ICD-10-CM | POA: Diagnosis present

## 2020-07-07 DIAGNOSIS — Z7901 Long term (current) use of anticoagulants: Secondary | ICD-10-CM | POA: Diagnosis not present

## 2020-07-07 DIAGNOSIS — Z794 Long term (current) use of insulin: Secondary | ICD-10-CM | POA: Diagnosis not present

## 2020-07-07 DIAGNOSIS — Z992 Dependence on renal dialysis: Secondary | ICD-10-CM | POA: Diagnosis not present

## 2020-07-07 DIAGNOSIS — N186 End stage renal disease: Secondary | ICD-10-CM | POA: Diagnosis present

## 2020-07-12 ENCOUNTER — Telehealth: Payer: Self-pay | Admitting: Family Medicine

## 2020-07-12 DIAGNOSIS — N2581 Secondary hyperparathyroidism of renal origin: Secondary | ICD-10-CM | POA: Diagnosis not present

## 2020-07-12 DIAGNOSIS — D631 Anemia in chronic kidney disease: Secondary | ICD-10-CM | POA: Diagnosis not present

## 2020-07-12 DIAGNOSIS — N186 End stage renal disease: Secondary | ICD-10-CM | POA: Diagnosis not present

## 2020-07-12 DIAGNOSIS — D500 Iron deficiency anemia secondary to blood loss (chronic): Secondary | ICD-10-CM | POA: Diagnosis not present

## 2020-07-12 DIAGNOSIS — E8809 Other disorders of plasma-protein metabolism, not elsewhere classified: Secondary | ICD-10-CM | POA: Diagnosis not present

## 2020-07-12 DIAGNOSIS — Z992 Dependence on renal dialysis: Secondary | ICD-10-CM | POA: Diagnosis not present

## 2020-07-12 NOTE — Telephone Encounter (Signed)
Yes, we can follow. OT and PT can be arranged through [facility]. Thanks, BJ

## 2020-07-12 NOTE — Telephone Encounter (Signed)
Kent Osborne is calling to see if Dr. Martinique will follow the pt for his home health care OT and PT.  Pt was in the hospital with COVID in [place] and will be released.

## 2020-07-12 NOTE — Telephone Encounter (Signed)
ATC x 1, unable to leave voicemail.

## 2020-07-12 NOTE — Telephone Encounter (Signed)
Left a message with [facility] with the message below.

## 2020-07-14 DIAGNOSIS — N186 End stage renal disease: Secondary | ICD-10-CM | POA: Diagnosis not present

## 2020-07-14 DIAGNOSIS — E8809 Other disorders of plasma-protein metabolism, not elsewhere classified: Secondary | ICD-10-CM | POA: Diagnosis not present

## 2020-07-14 DIAGNOSIS — N2581 Secondary hyperparathyroidism of renal origin: Secondary | ICD-10-CM | POA: Diagnosis not present

## 2020-07-14 DIAGNOSIS — D500 Iron deficiency anemia secondary to blood loss (chronic): Secondary | ICD-10-CM | POA: Diagnosis not present

## 2020-07-14 DIAGNOSIS — D631 Anemia in chronic kidney disease: Secondary | ICD-10-CM | POA: Diagnosis not present

## 2020-07-14 DIAGNOSIS — Z992 Dependence on renal dialysis: Secondary | ICD-10-CM | POA: Diagnosis not present

## 2020-07-16 DIAGNOSIS — Z992 Dependence on renal dialysis: Secondary | ICD-10-CM | POA: Diagnosis not present

## 2020-07-16 DIAGNOSIS — D500 Iron deficiency anemia secondary to blood loss (chronic): Secondary | ICD-10-CM | POA: Diagnosis not present

## 2020-07-16 DIAGNOSIS — N186 End stage renal disease: Secondary | ICD-10-CM | POA: Diagnosis not present

## 2020-07-16 DIAGNOSIS — D631 Anemia in chronic kidney disease: Secondary | ICD-10-CM | POA: Diagnosis not present

## 2020-07-16 DIAGNOSIS — E8809 Other disorders of plasma-protein metabolism, not elsewhere classified: Secondary | ICD-10-CM | POA: Diagnosis not present

## 2020-07-16 DIAGNOSIS — N2581 Secondary hyperparathyroidism of renal origin: Secondary | ICD-10-CM | POA: Diagnosis not present

## 2020-07-19 DIAGNOSIS — D500 Iron deficiency anemia secondary to blood loss (chronic): Secondary | ICD-10-CM | POA: Diagnosis not present

## 2020-07-19 DIAGNOSIS — D631 Anemia in chronic kidney disease: Secondary | ICD-10-CM | POA: Diagnosis not present

## 2020-07-19 DIAGNOSIS — Z992 Dependence on renal dialysis: Secondary | ICD-10-CM | POA: Diagnosis not present

## 2020-07-19 DIAGNOSIS — N186 End stage renal disease: Secondary | ICD-10-CM | POA: Diagnosis not present

## 2020-07-19 DIAGNOSIS — E8809 Other disorders of plasma-protein metabolism, not elsewhere classified: Secondary | ICD-10-CM | POA: Diagnosis not present

## 2020-07-19 DIAGNOSIS — N2581 Secondary hyperparathyroidism of renal origin: Secondary | ICD-10-CM | POA: Diagnosis not present

## 2020-07-19 NOTE — Telephone Encounter (Signed)
fyi

## 2020-07-19 NOTE — Telephone Encounter (Signed)
Vaughan Basta from [facility] is calling and wanted to let Dr. Martinique know that patient refused [place] and stated that he was doing fine and didn't need it, please advise. CB [phone]

## 2020-07-20 DIAGNOSIS — N186 End stage renal disease: Secondary | ICD-10-CM | POA: Diagnosis not present

## 2020-07-20 DIAGNOSIS — Z992 Dependence on renal dialysis: Secondary | ICD-10-CM | POA: Diagnosis not present

## 2020-07-21 DIAGNOSIS — N2581 Secondary hyperparathyroidism of renal origin: Secondary | ICD-10-CM | POA: Diagnosis not present

## 2020-07-21 DIAGNOSIS — N186 End stage renal disease: Secondary | ICD-10-CM | POA: Diagnosis not present

## 2020-07-21 DIAGNOSIS — Z992 Dependence on renal dialysis: Secondary | ICD-10-CM | POA: Diagnosis not present

## 2020-07-21 DIAGNOSIS — D631 Anemia in chronic kidney disease: Secondary | ICD-10-CM | POA: Diagnosis not present

## 2020-07-21 DIAGNOSIS — D6959 Other secondary thrombocytopenia: Secondary | ICD-10-CM | POA: Diagnosis not present

## 2020-07-21 DIAGNOSIS — D500 Iron deficiency anemia secondary to blood loss (chronic): Secondary | ICD-10-CM | POA: Diagnosis not present

## 2020-07-23 DIAGNOSIS — N186 End stage renal disease: Secondary | ICD-10-CM | POA: Diagnosis not present

## 2020-07-23 DIAGNOSIS — Z992 Dependence on renal dialysis: Secondary | ICD-10-CM | POA: Diagnosis not present

## 2020-07-23 DIAGNOSIS — N2581 Secondary hyperparathyroidism of renal origin: Secondary | ICD-10-CM | POA: Diagnosis not present

## 2020-07-23 DIAGNOSIS — D631 Anemia in chronic kidney disease: Secondary | ICD-10-CM | POA: Diagnosis not present

## 2020-07-23 DIAGNOSIS — D500 Iron deficiency anemia secondary to blood loss (chronic): Secondary | ICD-10-CM | POA: Diagnosis not present

## 2020-07-23 DIAGNOSIS — D6959 Other secondary thrombocytopenia: Secondary | ICD-10-CM | POA: Diagnosis not present

## 2020-07-26 DIAGNOSIS — N2581 Secondary hyperparathyroidism of renal origin: Secondary | ICD-10-CM | POA: Diagnosis not present

## 2020-07-26 DIAGNOSIS — D6959 Other secondary thrombocytopenia: Secondary | ICD-10-CM | POA: Diagnosis not present

## 2020-07-26 DIAGNOSIS — N186 End stage renal disease: Secondary | ICD-10-CM | POA: Diagnosis not present

## 2020-07-26 DIAGNOSIS — D500 Iron deficiency anemia secondary to blood loss (chronic): Secondary | ICD-10-CM | POA: Diagnosis not present

## 2020-07-26 DIAGNOSIS — Z992 Dependence on renal dialysis: Secondary | ICD-10-CM | POA: Diagnosis not present

## 2020-07-26 DIAGNOSIS — D631 Anemia in chronic kidney disease: Secondary | ICD-10-CM | POA: Diagnosis not present

## 2020-07-28 DIAGNOSIS — D6959 Other secondary thrombocytopenia: Secondary | ICD-10-CM | POA: Diagnosis not present

## 2020-07-28 DIAGNOSIS — E1129 Type 2 diabetes mellitus with other diabetic kidney complication: Secondary | ICD-10-CM | POA: Diagnosis not present

## 2020-07-28 DIAGNOSIS — N2581 Secondary hyperparathyroidism of renal origin: Secondary | ICD-10-CM | POA: Diagnosis not present

## 2020-07-28 DIAGNOSIS — D631 Anemia in chronic kidney disease: Secondary | ICD-10-CM | POA: Diagnosis not present

## 2020-07-28 DIAGNOSIS — D500 Iron deficiency anemia secondary to blood loss (chronic): Secondary | ICD-10-CM | POA: Diagnosis not present

## 2020-07-28 DIAGNOSIS — N186 End stage renal disease: Secondary | ICD-10-CM | POA: Diagnosis not present

## 2020-07-28 DIAGNOSIS — Z992 Dependence on renal dialysis: Secondary | ICD-10-CM | POA: Diagnosis not present

## 2020-07-29 DIAGNOSIS — M79673 Pain in unspecified foot: Secondary | ICD-10-CM | POA: Diagnosis not present

## 2020-07-29 DIAGNOSIS — M2010 Hallux valgus (acquired), unspecified foot: Secondary | ICD-10-CM | POA: Diagnosis not present

## 2020-07-29 DIAGNOSIS — L600 Ingrowing nail: Secondary | ICD-10-CM | POA: Diagnosis not present

## 2020-07-29 DIAGNOSIS — E1140 Type 2 diabetes mellitus with diabetic neuropathy, unspecified: Secondary | ICD-10-CM | POA: Diagnosis not present

## 2020-07-29 DIAGNOSIS — Z992 Dependence on renal dialysis: Secondary | ICD-10-CM | POA: Diagnosis not present

## 2020-07-30 DIAGNOSIS — Z992 Dependence on renal dialysis: Secondary | ICD-10-CM | POA: Diagnosis not present

## 2020-07-30 DIAGNOSIS — N186 End stage renal disease: Secondary | ICD-10-CM | POA: Diagnosis not present

## 2020-07-30 DIAGNOSIS — D500 Iron deficiency anemia secondary to blood loss (chronic): Secondary | ICD-10-CM | POA: Diagnosis not present

## 2020-07-30 DIAGNOSIS — N2581 Secondary hyperparathyroidism of renal origin: Secondary | ICD-10-CM | POA: Diagnosis not present

## 2020-07-30 DIAGNOSIS — D6959 Other secondary thrombocytopenia: Secondary | ICD-10-CM | POA: Diagnosis not present

## 2020-07-30 DIAGNOSIS — D631 Anemia in chronic kidney disease: Secondary | ICD-10-CM | POA: Diagnosis not present

## 2020-08-02 DIAGNOSIS — Z992 Dependence on renal dialysis: Secondary | ICD-10-CM | POA: Diagnosis not present

## 2020-08-02 DIAGNOSIS — D6959 Other secondary thrombocytopenia: Secondary | ICD-10-CM | POA: Diagnosis not present

## 2020-08-02 DIAGNOSIS — N2581 Secondary hyperparathyroidism of renal origin: Secondary | ICD-10-CM | POA: Diagnosis not present

## 2020-08-02 DIAGNOSIS — D631 Anemia in chronic kidney disease: Secondary | ICD-10-CM | POA: Diagnosis not present

## 2020-08-02 DIAGNOSIS — D500 Iron deficiency anemia secondary to blood loss (chronic): Secondary | ICD-10-CM | POA: Diagnosis not present

## 2020-08-02 DIAGNOSIS — N186 End stage renal disease: Secondary | ICD-10-CM | POA: Diagnosis not present

## 2020-08-03 DIAGNOSIS — D485 Neoplasm of uncertain behavior of skin: Secondary | ICD-10-CM | POA: Diagnosis not present

## 2020-08-03 DIAGNOSIS — C44629 Squamous cell carcinoma of skin of left upper limb, including shoulder: Secondary | ICD-10-CM | POA: Diagnosis not present

## 2020-08-04 DIAGNOSIS — D6959 Other secondary thrombocytopenia: Secondary | ICD-10-CM | POA: Diagnosis not present

## 2020-08-04 DIAGNOSIS — D500 Iron deficiency anemia secondary to blood loss (chronic): Secondary | ICD-10-CM | POA: Diagnosis not present

## 2020-08-04 DIAGNOSIS — D631 Anemia in chronic kidney disease: Secondary | ICD-10-CM | POA: Diagnosis not present

## 2020-08-04 DIAGNOSIS — N2581 Secondary hyperparathyroidism of renal origin: Secondary | ICD-10-CM | POA: Diagnosis not present

## 2020-08-04 DIAGNOSIS — Z992 Dependence on renal dialysis: Secondary | ICD-10-CM | POA: Diagnosis not present

## 2020-08-04 DIAGNOSIS — N186 End stage renal disease: Secondary | ICD-10-CM | POA: Diagnosis not present

## 2020-08-05 DIAGNOSIS — H43813 Vitreous degeneration, bilateral: Secondary | ICD-10-CM | POA: Diagnosis not present

## 2020-08-05 DIAGNOSIS — Z961 Presence of intraocular lens: Secondary | ICD-10-CM | POA: Diagnosis not present

## 2020-08-05 DIAGNOSIS — E113213 Type 2 diabetes mellitus with mild nonproliferative diabetic retinopathy with macular edema, bilateral: Secondary | ICD-10-CM | POA: Diagnosis not present

## 2020-08-05 DIAGNOSIS — D3122 Benign neoplasm of left retina: Secondary | ICD-10-CM | POA: Diagnosis not present

## 2020-08-05 DIAGNOSIS — H26493 Other secondary cataract, bilateral: Secondary | ICD-10-CM | POA: Diagnosis not present

## 2020-08-06 DIAGNOSIS — D631 Anemia in chronic kidney disease: Secondary | ICD-10-CM | POA: Diagnosis not present

## 2020-08-06 DIAGNOSIS — D500 Iron deficiency anemia secondary to blood loss (chronic): Secondary | ICD-10-CM | POA: Diagnosis not present

## 2020-08-06 DIAGNOSIS — N2581 Secondary hyperparathyroidism of renal origin: Secondary | ICD-10-CM | POA: Diagnosis not present

## 2020-08-06 DIAGNOSIS — N186 End stage renal disease: Secondary | ICD-10-CM | POA: Diagnosis not present

## 2020-08-06 DIAGNOSIS — Z992 Dependence on renal dialysis: Secondary | ICD-10-CM | POA: Diagnosis not present

## 2020-08-06 DIAGNOSIS — D6959 Other secondary thrombocytopenia: Secondary | ICD-10-CM | POA: Diagnosis not present

## 2020-08-09 DIAGNOSIS — G8929 Other chronic pain: Secondary | ICD-10-CM | POA: Diagnosis not present

## 2020-08-09 DIAGNOSIS — G4700 Insomnia, unspecified: Secondary | ICD-10-CM | POA: Diagnosis not present

## 2020-08-09 DIAGNOSIS — D631 Anemia in chronic kidney disease: Secondary | ICD-10-CM | POA: Diagnosis not present

## 2020-08-09 DIAGNOSIS — D500 Iron deficiency anemia secondary to blood loss (chronic): Secondary | ICD-10-CM | POA: Diagnosis not present

## 2020-08-09 DIAGNOSIS — N2581 Secondary hyperparathyroidism of renal origin: Secondary | ICD-10-CM | POA: Diagnosis not present

## 2020-08-09 DIAGNOSIS — Z79899 Other long term (current) drug therapy: Secondary | ICD-10-CM | POA: Diagnosis not present

## 2020-08-09 DIAGNOSIS — Z992 Dependence on renal dialysis: Secondary | ICD-10-CM | POA: Diagnosis not present

## 2020-08-09 DIAGNOSIS — M47896 Other spondylosis, lumbar region: Secondary | ICD-10-CM | POA: Diagnosis not present

## 2020-08-09 DIAGNOSIS — N186 End stage renal disease: Secondary | ICD-10-CM | POA: Diagnosis not present

## 2020-08-09 DIAGNOSIS — D6959 Other secondary thrombocytopenia: Secondary | ICD-10-CM | POA: Diagnosis not present

## 2020-08-11 DIAGNOSIS — N2581 Secondary hyperparathyroidism of renal origin: Secondary | ICD-10-CM | POA: Diagnosis not present

## 2020-08-11 DIAGNOSIS — Z992 Dependence on renal dialysis: Secondary | ICD-10-CM | POA: Diagnosis not present

## 2020-08-11 DIAGNOSIS — D6959 Other secondary thrombocytopenia: Secondary | ICD-10-CM | POA: Diagnosis not present

## 2020-08-11 DIAGNOSIS — D500 Iron deficiency anemia secondary to blood loss (chronic): Secondary | ICD-10-CM | POA: Diagnosis not present

## 2020-08-11 DIAGNOSIS — N186 End stage renal disease: Secondary | ICD-10-CM | POA: Diagnosis not present

## 2020-08-11 DIAGNOSIS — D631 Anemia in chronic kidney disease: Secondary | ICD-10-CM | POA: Diagnosis not present

## 2020-08-13 DIAGNOSIS — D500 Iron deficiency anemia secondary to blood loss (chronic): Secondary | ICD-10-CM | POA: Diagnosis not present

## 2020-08-13 DIAGNOSIS — D6959 Other secondary thrombocytopenia: Secondary | ICD-10-CM | POA: Diagnosis not present

## 2020-08-13 DIAGNOSIS — N2581 Secondary hyperparathyroidism of renal origin: Secondary | ICD-10-CM | POA: Diagnosis not present

## 2020-08-13 DIAGNOSIS — N186 End stage renal disease: Secondary | ICD-10-CM | POA: Diagnosis not present

## 2020-08-13 DIAGNOSIS — Z992 Dependence on renal dialysis: Secondary | ICD-10-CM | POA: Diagnosis not present

## 2020-08-13 DIAGNOSIS — D631 Anemia in chronic kidney disease: Secondary | ICD-10-CM | POA: Diagnosis not present

## 2020-08-16 DIAGNOSIS — N186 End stage renal disease: Secondary | ICD-10-CM | POA: Diagnosis not present

## 2020-08-16 DIAGNOSIS — N2581 Secondary hyperparathyroidism of renal origin: Secondary | ICD-10-CM | POA: Diagnosis not present

## 2020-08-16 DIAGNOSIS — D631 Anemia in chronic kidney disease: Secondary | ICD-10-CM | POA: Diagnosis not present

## 2020-08-16 DIAGNOSIS — D6959 Other secondary thrombocytopenia: Secondary | ICD-10-CM | POA: Diagnosis not present

## 2020-08-16 DIAGNOSIS — D500 Iron deficiency anemia secondary to blood loss (chronic): Secondary | ICD-10-CM | POA: Diagnosis not present

## 2020-08-16 DIAGNOSIS — Z992 Dependence on renal dialysis: Secondary | ICD-10-CM | POA: Diagnosis not present

## 2020-08-18 ENCOUNTER — Other Ambulatory Visit: Payer: Self-pay | Admitting: Urology

## 2020-08-18 ENCOUNTER — Other Ambulatory Visit (HOSPITAL_COMMUNITY): Payer: Self-pay | Admitting: Urology

## 2020-08-18 DIAGNOSIS — D3002 Benign neoplasm of left kidney: Secondary | ICD-10-CM

## 2020-08-18 DIAGNOSIS — D6959 Other secondary thrombocytopenia: Secondary | ICD-10-CM | POA: Diagnosis not present

## 2020-08-18 DIAGNOSIS — N2581 Secondary hyperparathyroidism of renal origin: Secondary | ICD-10-CM | POA: Diagnosis not present

## 2020-08-18 DIAGNOSIS — D500 Iron deficiency anemia secondary to blood loss (chronic): Secondary | ICD-10-CM | POA: Diagnosis not present

## 2020-08-18 DIAGNOSIS — Z992 Dependence on renal dialysis: Secondary | ICD-10-CM | POA: Diagnosis not present

## 2020-08-18 DIAGNOSIS — N186 End stage renal disease: Secondary | ICD-10-CM | POA: Diagnosis not present

## 2020-08-18 DIAGNOSIS — D631 Anemia in chronic kidney disease: Secondary | ICD-10-CM | POA: Diagnosis not present

## 2020-08-20 DIAGNOSIS — D6959 Other secondary thrombocytopenia: Secondary | ICD-10-CM | POA: Diagnosis not present

## 2020-08-20 DIAGNOSIS — Z992 Dependence on renal dialysis: Secondary | ICD-10-CM | POA: Diagnosis not present

## 2020-08-20 DIAGNOSIS — D500 Iron deficiency anemia secondary to blood loss (chronic): Secondary | ICD-10-CM | POA: Diagnosis not present

## 2020-08-20 DIAGNOSIS — N2581 Secondary hyperparathyroidism of renal origin: Secondary | ICD-10-CM | POA: Diagnosis not present

## 2020-08-20 DIAGNOSIS — N186 End stage renal disease: Secondary | ICD-10-CM | POA: Diagnosis not present

## 2020-08-20 DIAGNOSIS — D631 Anemia in chronic kidney disease: Secondary | ICD-10-CM | POA: Diagnosis not present

## 2020-08-26 ENCOUNTER — Ambulatory Visit (INDEPENDENT_AMBULATORY_CARE_PROVIDER_SITE_OTHER): Payer: Medicare Other

## 2020-08-26 ENCOUNTER — Other Ambulatory Visit: Payer: Self-pay

## 2020-08-26 DIAGNOSIS — Z0000 Encounter for general adult medical examination without abnormal findings: Secondary | ICD-10-CM | POA: Diagnosis not present

## 2020-08-26 NOTE — Progress Notes (Signed)
Subjective:   Kent Osborne is a 70 y.o. male who presents for an Initial Medicare Annual Wellness Visit.  Virtual Visit via Video Note  I connected with Kent Osborne on 08/26/20 at  9:45 AM EST by a video enabled telemedicine application and verified that I am speaking with the correct person using two identifiers.  Location: Patient: Home  Provider: office   I discussed the limitations of evaluation and management by telemedicine and the availability of in person appointments. The patient expressed understanding and agreed to proceed.     Kent Neas, LPN    Review of Systems    N/A  Cardiac Risk Factors include: advanced age (>19men, >32 women);diabetes mellitus;hypertension;male gender;dyslipidemia     Objective:    Today's Vitals   There is no height or weight on file to calculate BMI.  Advanced Directives 08/26/2020 07/06/2020 05/25/2020 08/06/2019 04/23/2018 04/18/2018 03/04/2018  Does Patient Have a Medical Advance Directive? No No No No No No No  Would patient like information on creating a medical advance directive? No - Patient declined - - No - Patient declined No - Patient declined No - Patient declined Yes (Inpatient - patient defers creating a medical advance directive at this time)    Current Medications (verified) Outpatient Encounter Medications as of 08/26/2020  Medication Sig  . amitriptyline (ELAVIL) 25 MG tablet Take 25 mg by mouth at bedtime.   Marland Kitchen aspirin EC 81 MG tablet Take 81 mg by mouth daily.  [name] 1 GM 210 MG(Fe) tablet   . calcium acetate (PHOSLO) 667 MG capsule Take 1 capsule (667 mg total) by mouth 3 (three) times daily with meals.  . carvedilol (COREG) 6.25 MG tablet TAKE 1 TABLET BY MOUTH TWICE DAILY WITH A MEAL  . Cholecalciferol (VITAMIN D) 2000 units tablet Take 2,000 Units by mouth daily.  . Continuous Blood Gluc Receiver (DEXCOM G5 RECEIVER KIT) DEVI 1 kit by Does not apply route as directed.  Marland Kitchen glucose blood test  strip USE 1 STRIP TO CHECK GLUCOSE THREE TIMES DAILY  . hydrALAZINE (APRESOLINE) 50 MG tablet Take 1 tablet (50 mg total) by mouth 3 (three) times daily.  . insulin degludec (TRESIBA FLEXTOUCH) 100 UNIT/ML FlexTouch Pen INJECT 60 UNITS SUB-Q ONCE DAILY  . lidocaine-prilocaine (EMLA) cream APPLY SMALL AMOUNT TO ACCESS SITE (AVF) 1 TO 2 HOURS BEFORE DIALYSIS. COVER WITH OCCLUSIVE DRESSING (SARAN WRAP)  . montelukast (SINGULAIR) 10 MG tablet Take 10 mg by mouth at bedtime.   Marland Kitchen NOVOLOG FLEXPEN 100 UNIT/ML FlexPen INJECT 2 TO 25 UNITS SUBCUTANEOUSLY 3 TIMES A DAY WITH MEALS PER SLIDING SCALE  . Oxycodone HCl 10 MG TABS Take 10 mg by mouth 4 (four) times daily as needed (for pain).   . simvastatin (ZOCOR) 40 MG tablet TAKE 1 TABLET BY MOUTH AT BEDTIME  . tamsulosin (FLOMAX) 0.4 MG CAPS capsule Take 1 capsule (0.4 mg total) by mouth daily.  . colchicine 0.6 MG tablet TAKE 2 TABLETS BY MOUTH AS SOON AS GOUT SYMPTOMS START (Patient not taking: Reported on 08/26/2020)  . diphenoxylate-atropine (LOMOTIL) 2.5-0.025 MG tablet Take 1 tablet by mouth 4 (four) times daily as needed for diarrhea or loose stools. (Patient not taking: Reported on 08/26/2020)  . isosorbide mononitrate (IMDUR) 60 MG 24 hr tablet Take 1 tablet by mouth once daily (Patient not taking: Reported on 08/26/2020)  . nitroGLYCERIN (NITROSTAT) 0.4 MG SL tablet Place 1 tablet (0.4 mg total) under the tongue every 5 (five) minutes as needed  for chest pain. (Patient not taking: Reported on 08/26/2020)  . ondansetron (ZOFRAN ODT) 4 MG disintegrating tablet Take 1 tablet (4 mg total) by mouth every 8 (eight) hours as needed for nausea or vomiting. (Patient not taking: Reported on 08/26/2020)   No facility-administered encounter medications on file as of 08/26/2020.    Allergies (verified) Olmesartan   History: Past Medical History:  Diagnosis Date  . Acute renal failure superimposed on chronic kidney disease ([place])    Kent Osborne 11/01/2016  . Anemia   .  Arthritis    "back, knees" (11/01/2016)  . Asthma   . CAD (coronary artery disease) 12/31/07   [facility] stents- placed in the circumflex and LAD  . Chronic bronchitis ([place])   . Hemodialysis status ([place])   . History of blood transfusion 10/30/2016   "related to anemia"  . History of gout   . History of kidney stones   . Hyperlipidemia   . Hypertension   . NSTEMI (non-ST elevated myocardial infarction) ([place]) 10/30/2016  . OA (osteoarthritis)   . Obese   . On home oxygen therapy    "2L w/CPAP" (11/01/2016)  . OSA on CPAP   . Psoriatic arthritis ([place])   . Type II diabetes mellitus ([place])    Past Surgical History:  Procedure Laterality Date  . AV FISTULA PLACEMENT    . CORONARY ANGIOPLASTY WITH STENT PLACEMENT  2009   "3 stents"  . CYSTOSCOPY WITH URETEROSCOPY AND STENT PLACEMENT Bilateral 04/23/2018   Procedure: CYSTOSCOPY WITH BLADDER  BIOPSY BILATERAL RETROGRADE PYELOGRAM LEFT URETEROSCOPY WITH BIOPSY AND LEFT URETERAL  STENT PLACEMENT;  Surgeon: Festus Aloe, MD;  Location: [facility];  Service: Urology;  Laterality: Bilateral;   Family History  Problem Relation Age of Onset  . Diabetes Other   . Hyperlipidemia Other   . Hypertension Other    Social History   Socioeconomic History  . Marital status: Divorced    Spouse name: Not on file  . Number of children: 3  . Years of education: 74  . Highest education level: High school graduate  Occupational History  . Not on file  Tobacco Use  . Smoking status: Never Smoker  . Smokeless tobacco: Never Used  Vaping Use  . Vaping Use: Never used  Substance and Sexual Activity  . Alcohol use: Not Currently    Comment: 11/01/2016 "nothing in years"  . Drug use: No  . Sexual activity: Yes  Other Topics Concern  . Not on file  Social History Narrative   Divorced   1 son and 2 daughters   2 grandsons   Retired Conservation officer, nature   Social Determinants of Radio broadcast assistant Strain: [place]   . Difficulty of  Paying Living Expenses: Not hard at all  Food Insecurity: No Food Insecurity  . Worried About Charity fundraiser in the Last Year: Never true  . Ran Out of Food in the Last Year: Never true  Transportation Needs: No Transportation Needs  . Lack of Transportation (Medical): No  . Lack of Transportation (Non-Medical): No  Physical Activity: Inactive  . Days of Exercise per Week: 0 days  . Minutes of Exercise per Session: 0 min  Stress: No Stress Concern Present  . Feeling of Stress : Not at all  Social Connections: Moderately Integrated  . Frequency of Communication with Friends and Family: More than three times a week  . Frequency of Social Gatherings with Friends and Family: More than three times a week  .  Attends Religious Services: More than 4 times per year  . Active Member of Clubs or Organizations: Yes  . Attends Archivist Meetings: Never  . Marital Status: Divorced    Tobacco Counseling Counseling given: Not Answered   Clinical Intake:  Pre-visit preparation completed: Yes  Pain : No/denies pain     Nutritional Risks: Nausea/ vomitting/ diarrhea (Diarrhea) Diabetes: Yes CBG done?: No Did pt. bring in CBG monitor from home?: No  How often do you need to have someone help you when you read instructions, pamphlets, or other written materials from your doctor or pharmacy?: 1 - Never What is the last grade level you completed in school?: 12th grade  Diabetic?Yes Nutrition Risk Assessment:  Has the patient had any N/V/D within the last 2 months?  Yes  Does the patient have any non-healing wounds?  No  Has the patient had any unintentional weight loss or weight gain?  No   Diabetes:  Is the patient diabetic?  Yes  If diabetic, was a CBG obtained today?  No  Did the patient bring in their glucometer from home?  No  How often do you monitor your CBG's? Patient states checks glucose 4-5 times per day    Financial Strains and Diabetes Management:  Are  you having any financial strains with the device, your supplies or your medication? No .  Does the patient want to be seen by Chronic Care Management for management of their diabetes?  No  Would the patient like to be referred to a Nutritionist or for Diabetic Management?  No   Diabetic Exams:  Diabetic Eye Exam: Completed 11/06/2019 Diabetic Foot Exam: Overdue, Pt has been advised about the importance in completing this exam. Pt is scheduled for diabetic foot exam on 09/07/2020.   Interpreter Needed?: No  Information entered by :: [place] of Daily Living In your present state of health, do you have any difficulty performing the following activities: 08/26/2020  Hearing? N  Vision? N  Difficulty concentrating or making decisions? N  Walking or climbing stairs? Y  Dressing or bathing? N  Doing errands, shopping? N  Preparing Food and eating ? N  Using the Toilet? N  In the past six months, have you accidently leaked urine? N  Do you have problems with loss of bowel control? N  Managing your Medications? N  Managing your Finances? N  Housekeeping or managing your Housekeeping? N  Some recent data might be hidden    Patient Care Team: Martinique, Betty G, MD as PCP - General (Family Medicine) Canavan, Alphonse Guild, DPM as Referring Physician (Podiatry)  Indicate any recent Medical Services you may have received from other than [facility] providers in the past year (date may be approximate).     Assessment:   This is a routine wellness examination for [place].  Hearing/Vision screen  Hearing Screening   '125Hz'$  $[place]'250Hz'wzOGK$'500Hz'$'1000Hz'$'2000Hz'$'3000Hz'$'4000Hz'$'6000Hz'$'8000Hz'$   Right ear:           Left ear:           Vision Screening Comments: Patient states gets eyes examined once per year. Had eye exam on 08/25/2020. Had some leakage to retina. To follow up in 3 months    Dietary issues and exercise activities discussed: Current Exercise Habits: The patient does not participate  in regular exercise at present  Goals    . Cut out extra servings    . Exercise 3x per week    .  Patient Stated     I would like to get my strength back and be able to get back out again and doing more!       Depression Screen PHQ 2/9 Scores 08/26/2020 08/06/2019  PHQ - 2 Score 0 0  PHQ- 9 Score 0 -    Fall Risk Fall Risk  08/26/2020 08/06/2019  Falls in the past year? 1 1  Number falls in past yr: 1 0  Injury with Fall? 0 0  Risk for fall due to : History of fall(s) History of fall(s);Medication side effect  Follow up Falls evaluation completed;Falls prevention discussed Falls evaluation completed;Education provided;Falls prevention discussed    FALL RISK PREVENTION PERTAINING TO THE HOME:  Any stairs in or around the home? No  If so, are there any without handrails? No  Home free of loose throw rugs in walkways, pet beds, electrical cords, etc? Yes  Adequate lighting in your home to reduce risk of falls? Yes   ASSISTIVE DEVICES UTILIZED TO PREVENT FALLS:  Life alert? No  Use of a cane, walker or w/c? No  Grab bars in the bathroom? Yes  Shower chair or bench in shower? Yes  Elevated toilet seat or a handicapped toilet? No    Cognitive Function: Normal cognitive status assessed by direct observation by this Nurse Health Advisor. No abnormalities found.       6CIT Screen 08/06/2019  What Year? 0 points  What month? 0 points  What time? 0 points  Count back from 20 0 points  Months in reverse 0 points  Repeat phrase 0 points  Total Score 0    Immunizations Immunization History  Administered Date(s) Administered  . H1N1 07/28/2008  . Hepatitis B, adult 04/03/2018, 05/01/2018, 05/29/2018, 10/02/2018, 12/03/2019  . Influenza Split 05/29/2011  . Influenza Whole 08/21/2002, 05/26/2009, 05/05/2010  . Influenza, High Dose Seasonal PF 05/28/2020  . Influenza,inj,Quad PF,6+ Mos 06/17/2013, 04/15/2014, 04/27/2016, 05/07/2019  . Janssen (J&J) SARS-COV-2 Vaccination  10/28/2019  . Pneumococcal Conjugate-13 04/15/2014  . Pneumococcal Polysaccharide-23 08/21/2002, 06/17/2013, 04/18/2019  . Td 05/21/1998, 05/05/2010    TDAP status: Due, Education has been provided regarding the importance of this vaccine. Advised may receive this vaccine at local pharmacy or Health Dept. Aware to provide a copy of the vaccination record if obtained from local pharmacy or Health Dept. Verbalized acceptance and understanding.  Flu Vaccine status: Up to date  Pneumococcal vaccine status: Up to date  Covid-19 vaccine status: Completed vaccines  Qualifies for Shingles Vaccine? Yes   Zostavax completed No   Shingrix Completed?: No.    Education has been provided regarding the importance of this vaccine. Patient has been advised to call insurance company to determine out of pocket expense if they have not yet received this vaccine. Advised may also receive vaccine at local pharmacy or Health Dept. Verbalized acceptance and understanding.  Screening Tests Health Maintenance  Topic Date Due  . COVID-19 Vaccine (2 - Booster for [employer] series) 12/23/2019  . FOOT EXAM  08/25/2020  . TETANUS/TDAP  06/26/2021 (Originally 05/05/2020)  . COLONOSCOPY (Pts 45-8yrs Insurance coverage will need to be confirmed)  10/11/2020  . HEMOGLOBIN A1C  10/26/2020  . OPHTHALMOLOGY EXAM  11/05/2020  . INFLUENZA VACCINE  Completed  . PNA vac Low Risk Adult  Completed  . Hepatitis C Screening  Discontinued    Health Maintenance  Health Maintenance Due  Topic Date Due  . COVID-19 Vaccine (2 - Booster for [employer] series) 12/23/2019  . FOOT EXAM  08/25/2020    Colorectal cancer screening: Type of screening: Colonoscopy. Completed 10/11/2010. Repeat every 10 years  Lung Cancer Screening: (Low Dose CT Chest recommended if Age 88-80 years, 30 pack-year currently smoking OR have quit w/in 15years.) does not qualify.   Lung Cancer Screening Referral: N/A  Additional Screening:  Hepatitis C  Screening: does qualify;   Vision Screening: Recommended annual ophthalmology exams for early detection of glaucoma and other disorders of the eye. Is the patient up to date with their annual eye exam?  Yes  Who is the provider or what is the name of the office in which the patient attends annual eye exams? [facility]  If pt is not established with a provider, would they like to be referred to a provider to establish care? No .   Dental Screening: Recommended annual dental exams for proper oral hygiene  Community Resource Referral / Chronic Care Management: CRR required this visit?  No   CCM required this visit?  No      Plan:     I have personally reviewed and noted the following in the patient's chart:   . Medical and social history . Use of alcohol, tobacco or illicit drugs  . Current medications and supplements . Functional ability and status . Nutritional status . Physical activity . Advanced directives . List of other physicians . Hospitalizations, surgeries, and ER visits in previous 12 months . Vitals . Screenings to include cognitive, depression, and falls . Referrals and appointments  In addition, I have reviewed and discussed with patient certain preventive protocols, quality metrics, and best practice recommendations. A written personalized care plan for preventive services as well as general preventive health recommendations were provided to patient.     Kent Neas, LPN   11/27/7528   Nurse Notes: None

## 2020-08-26 NOTE — Patient Instructions (Signed)
Kent Osborne , Thank you for taking time to come for your Medicare Wellness Visit. I appreciate your ongoing commitment to your health goals. Please review the following plan we discussed and let me know if I can assist you in the future.   Screening recommendations/referrals: Colonoscopy: Up to date, next due 10/02/2020 Recommended yearly ophthalmology/optometry visit for glaucoma screening and checkup Recommended yearly dental visit for hygiene and checkup  Vaccinations: Influenza vaccine: Up to date, next due fall 2022  Pneumococcal vaccine: Completed series  Tdap vaccine: Currently due, you may contact your insurance company to discuss any out of pocket cost or await and injury to receive  Shingles vaccine: Currently due for Shingrix, if you wish to receive we recommend that you do so at your local pharmacy     Advanced directives: Advance directive discussed with you today. Even though you declined this today please call our office should you change your mind and we can give you the proper paperwork for you to fill out.   Conditions/risks identified: None   Next appointment: 09/07/2020 @ 11:00 am to see Dr. Martinique   Preventive Care 43 Years and Older, Male Preventive care refers to lifestyle choices and visits with your health care provider that can promote health and wellness. What does preventive care include?  A yearly physical exam. This is also called an annual well check.  Dental exams once or twice a year.  Routine eye exams. Ask your health care provider how often you should have your eyes checked.  Personal lifestyle choices, including:  Daily care of your teeth and gums.  Regular physical activity.  Eating a healthy diet.  Avoiding tobacco and drug use.  Limiting alcohol use.  Practicing safe sex.  Taking low doses of aspirin every day.  Taking vitamin and mineral supplements as recommended by your health care provider. What happens during an annual  well check? The services and screenings done by your health care provider during your annual well check will depend on your age, overall health, lifestyle risk factors, and family history of disease. Counseling  Your health care provider may ask you questions about your:  Alcohol use.  Tobacco use.  Drug use.  Emotional well-being.  Home and relationship well-being.  Sexual activity.  Eating habits.  History of falls.  Memory and ability to understand (cognition).  Work and work Statistician. Screening  You may have the following tests or measurements:  Height, weight, and BMI.  Blood pressure.  Lipid and cholesterol levels. These may be checked every 5 years, or more frequently if you are over 21 years old.  Skin check.  Lung cancer screening. You may have this screening every year starting at age 51 if you have a 30-pack-year history of smoking and currently smoke or have quit within the past 15 years.  Fecal occult blood test (FOBT) of the stool. You may have this test every year starting at age 5.  Flexible sigmoidoscopy or colonoscopy. You may have a sigmoidoscopy every 5 years or a colonoscopy every 10 years starting at age 51.  Prostate cancer screening. Recommendations will vary depending on your family history and other risks.  Hepatitis C blood test.  Hepatitis B blood test.  Sexually transmitted disease (STD) testing.  Diabetes screening. This is done by checking your blood sugar (glucose) after you have not eaten for a while (fasting). You may have this done every 1-3 years.  Abdominal aortic aneurysm (AAA) screening. You may need this if you are  a current or former smoker.  Osteoporosis. You may be screened starting at age 68 if you are at high risk. Talk with your health care provider about your test results, treatment options, and if necessary, the need for more tests. Vaccines  Your health care provider may recommend certain vaccines, such  as:  Influenza vaccine. This is recommended every year.  Tetanus, diphtheria, and acellular pertussis (Tdap, Td) vaccine. You may need a Td booster every 10 years.  Zoster vaccine. You may need this after age 68.  Pneumococcal 13-valent conjugate (PCV13) vaccine. One dose is recommended after age 81.  Pneumococcal polysaccharide (PPSV23) vaccine. One dose is recommended after age 32. Talk to your health care provider about which screenings and vaccines you need and how often you need them. This information is not intended to replace advice given to you by your health care provider. Make sure you discuss any questions you have with your health care provider. Document Released: 09/03/2015 Document Revised: 04/26/2016 Document Reviewed: 06/08/2015 Elsevier Interactive Patient Education  2017 [place] Prevention in the Home Falls can cause injuries. They can happen to people of all ages. There are many things you can do to make your home safe and to help prevent falls. What can I do on the outside of my home?  Regularly fix the edges of walkways and driveways and fix any cracks.  Remove anything that might make you trip as you walk through a door, such as a raised step or threshold.  Trim any bushes or trees on the path to your home.  Use bright outdoor lighting.  Clear any walking paths of anything that might make someone trip, such as rocks or tools.  Regularly check to see if handrails are loose or broken. Make sure that both sides of any steps have handrails.  Any raised decks and porches should have guardrails on the edges.  Have any leaves, snow, or ice cleared regularly.  Use sand or salt on walking paths during winter.  Clean up any spills in your garage right away. This includes oil or grease spills. What can I do in the bathroom?  Use night lights.  Install grab bars by the toilet and in the tub and shower. Do not use towel bars as grab bars.  Use  non-skid mats or decals in the tub or shower.  If you need to sit down in the shower, use a plastic, non-slip stool.  Keep the floor dry. Clean up any water that spills on the floor as soon as it happens.  Remove soap buildup in the tub or shower regularly.  Attach bath mats securely with double-sided non-slip rug tape.  Do not have throw rugs and other things on the floor that can make you trip. What can I do in the bedroom?  Use night lights.  Make sure that you have a light by your bed that is easy to reach.  Do not use any sheets or blankets that are too big for your bed. They should not hang down onto the floor.  Have a firm chair that has side arms. You can use this for support while you get dressed.  Do not have throw rugs and other things on the floor that can make you trip. What can I do in the kitchen?  Clean up any spills right away.  Avoid walking on wet floors.  Keep items that you use a lot in easy-to-reach places.  If you need to reach something  above you, use a strong step stool that has a grab bar.  Keep electrical cords out of the way.  Do not use floor polish or wax that makes floors slippery. If you must use wax, use non-skid floor wax.  Do not have throw rugs and other things on the floor that can make you trip. What can I do with my stairs?  Do not leave any items on the stairs.  Make sure that there are handrails on both sides of the stairs and use them. Fix handrails that are broken or loose. Make sure that handrails are as long as the stairways.  Check any carpeting to make sure that it is firmly attached to the stairs. Fix any carpet that is loose or worn.  Avoid having throw rugs at the top or bottom of the stairs. If you do have throw rugs, attach them to the floor with carpet tape.  Make sure that you have a light switch at the top of the stairs and the bottom of the stairs. If you do not have them, ask someone to add them for you. What  else can I do to help prevent falls?  Wear shoes that:  Do not have high heels.  Have rubber bottoms.  Are comfortable and fit you well.  Are closed at the toe. Do not wear sandals.  If you use a stepladder:  Make sure that it is fully opened. Do not climb a closed stepladder.  Make sure that both sides of the stepladder are locked into place.  Ask someone to hold it for you, if possible.  Clearly mark and make sure that you can see:  Any grab bars or handrails.  First and last steps.  Where the edge of each step is.  Use tools that help you move around (mobility aids) if they are needed. These include:  Canes.  Walkers.  Scooters.  Crutches.  Turn on the lights when you go into a dark area. Replace any light bulbs as soon as they burn out.  Set up your furniture so you have a clear path. Avoid moving your furniture around.  If any of your floors are uneven, fix them.  If there are any pets around you, be aware of where they are.  Review your medicines with your doctor. Some medicines can make you feel dizzy. This can increase your chance of falling. Ask your doctor what other things that you can do to help prevent falls. This information is not intended to replace advice given to you by your health care provider. Make sure you discuss any questions you have with your health care provider. Document Released: 06/03/2009 Document Revised: 01/13/2016 Document Reviewed: 09/11/2014 Elsevier Interactive Patient Education  2017 [employer].

## 2020-08-30 ENCOUNTER — Ambulatory Visit (HOSPITAL_COMMUNITY): Payer: Medicare Other

## 2020-09-07 ENCOUNTER — Telehealth (INDEPENDENT_AMBULATORY_CARE_PROVIDER_SITE_OTHER): Payer: Medicare Other | Admitting: Family Medicine

## 2020-09-07 ENCOUNTER — Other Ambulatory Visit: Payer: Self-pay | Admitting: Family Medicine

## 2020-09-07 ENCOUNTER — Ambulatory Visit: Payer: Medicare Other | Admitting: Family Medicine

## 2020-09-07 ENCOUNTER — Encounter: Payer: Self-pay | Admitting: Family Medicine

## 2020-09-07 VITALS — Ht 68.0 in

## 2020-09-07 DIAGNOSIS — N281 Cyst of kidney, acquired: Secondary | ICD-10-CM

## 2020-09-07 DIAGNOSIS — I700 Atherosclerosis of aorta: Secondary | ICD-10-CM

## 2020-09-07 DIAGNOSIS — Z992 Dependence on renal dialysis: Secondary | ICD-10-CM

## 2020-09-07 DIAGNOSIS — R300 Dysuria: Secondary | ICD-10-CM

## 2020-09-07 DIAGNOSIS — E1122 Type 2 diabetes mellitus with diabetic chronic kidney disease: Secondary | ICD-10-CM

## 2020-09-07 DIAGNOSIS — U071 COVID-19: Secondary | ICD-10-CM

## 2020-09-07 DIAGNOSIS — N186 End stage renal disease: Secondary | ICD-10-CM

## 2020-09-07 DIAGNOSIS — I10 Essential (primary) hypertension: Secondary | ICD-10-CM | POA: Diagnosis not present

## 2020-09-07 MED ORDER — SULFAMETHOXAZOLE-TRIMETHOPRIM 800-160 MG PO TABS: 1.0000 | ORAL_TABLET | Freq: Every day | ORAL | 0 refills | Status: AC

## 2020-09-07 NOTE — Progress Notes (Signed)
Virtual Visit via Video Note I connected with Kent Osborne on 09/07/20 by a video enabled telemedicine application and verified that I am speaking with the correct person using two identifiers.  Location patient: home Location provider:work office Persons participating in the virtual visit: patient, provider  I discussed the limitations of evaluation and management by telemedicine and the availability of in person appointments. The patient expressed understanding and agreed to proceed.  Chief Complaint  Patient presents with  . Follow-up   HPI: Kent Osborne is a 70 yo male with hx of ESRD on HD,HTN,DM II,CAD,and allergies following on hospitalization in 06/2020 in [place]. Admitted for COVID 19 infection on 07/05/20 and discharged on 07/09/20. Still has LE weakness sensation, all other symptoms resolved.  Pending renal MRI (09/12/20) to evaluate left renal cyst. Pending appt with urologist to discuss surgical treatment. Still having episodes of gross hematuria.  C/O 2 days of urethral discomfort,dysuria, improved after urination. He thinks it is prostate related. He had similar symptoms in the past and short course of Bactrim helped. He has not noted fever,changes in appetite,unusual fatigue, abdominal pain, unusual back pain,increased in urinary frequency, urethral discharge, or genital lesions.  DM II: He is on Tresiba 60 U daily and Novolog 2-25 U tid before meals per sliding scale. Last HgA1C done at the dialysis center in the lower 6.0. Fasting BS's low 100's.  He has not had hypoglycemic events but has needed less Novolog dose.  HTN: He is on Hydralazine 50 mg tid,Imdur 60 mg daily, and Carvedilol 6.25 mg bid. Negative for severe/frequent headache, visual changes, chest pain, dyspnea, palpitation, or focal weakness.  Lab Results  Component Value Date   CREATININE 6.75 (H) 07/06/2020   BUN 42 (H) 07/06/2020   NA 135 07/06/2020   K 4.7 07/06/2020   CL 92 (L) 07/06/2020   CO2 31  07/06/2020   ROS: See pertinent positives and negatives per HPI.  Past Medical History:  Diagnosis Date  . Acute renal failure superimposed on chronic kidney disease ([place])    Kent Osborne 11/01/2016  . Anemia   . Arthritis    "back, knees" (11/01/2016)  . Asthma   . CAD (coronary artery disease) 12/31/07   [facility] stents- placed in the circumflex and LAD  . Chronic bronchitis ([place])   . Hemodialysis status ([place])   . History of blood transfusion 10/30/2016   "related to anemia"  . History of gout   . History of kidney stones   . Hyperlipidemia   . Hypertension   . NSTEMI (non-ST elevated myocardial infarction) ([place]) 10/30/2016  . OA (osteoarthritis)   . Obese   . On home oxygen therapy    "2L w/CPAP" (11/01/2016)  . OSA on CPAP   . Psoriatic arthritis ([place])   . Type II diabetes mellitus ([place])     Past Surgical History:  Procedure Laterality Date  . AV FISTULA PLACEMENT    . CORONARY ANGIOPLASTY WITH STENT PLACEMENT  2009   "3 stents"  . CYSTOSCOPY WITH URETEROSCOPY AND STENT PLACEMENT Bilateral 04/23/2018   Procedure: CYSTOSCOPY WITH BLADDER  BIOPSY BILATERAL RETROGRADE PYELOGRAM LEFT URETEROSCOPY WITH BIOPSY AND LEFT URETERAL  STENT PLACEMENT;  Surgeon: Festus Aloe, MD;  Location: [facility];  Service: Urology;  Laterality: Bilateral;    Family History  Problem Relation Age of Onset  . Diabetes Other   . Hyperlipidemia Other   . Hypertension Other     Social History   Socioeconomic History  . Marital status: Divorced  Spouse name: Not on file  . Number of children: 3  . Years of education: 22  . Highest education level: High school graduate  Occupational History  . Not on file  Tobacco Use  . Smoking status: Never Smoker  . Smokeless tobacco: Never Used  Vaping Use  . Vaping Use: Never used  Substance and Sexual Activity  . Alcohol use: Not Currently    Comment: 11/01/2016 "nothing in years"  . Drug use: No  . Sexual activity: Yes  Other Topics  Concern  . Not on file  Social History Narrative   Divorced   1 son and 2 daughters   2 grandsons   Retired Conservation officer, nature   Social Determinants of Radio broadcast assistant Strain: [place]   . Difficulty of Paying Living Expenses: Not hard at all  Food Insecurity: No Food Insecurity  . Worried About Charity fundraiser in the Last Year: Never true  . Ran Out of Food in the Last Year: Never true  Transportation Needs: No Transportation Needs  . Lack of Transportation (Medical): No  . Lack of Transportation (Non-Medical): No  Physical Activity: Inactive  . Days of Exercise per Week: 0 days  . Minutes of Exercise per Session: 0 min  Stress: No Stress Concern Present  . Feeling of Stress : Not at all  Social Connections: Moderately Integrated  . Frequency of Communication with Friends and Family: More than three times a week  . Frequency of Social Gatherings with Friends and Family: More than three times a week  . Attends Religious Services: More than 4 times per year  . Active Member of Clubs or Organizations: Yes  . Attends Archivist Meetings: Never  . Marital Status: Divorced  Human resources officer Violence: Not At Risk  . Fear of Current or Ex-Partner: No  . Emotionally Abused: No  . Physically Abused: No  . Sexually Abused: No    Current Outpatient Medications:  .  amitriptyline (ELAVIL) 25 MG tablet, Take 25 mg by mouth at bedtime. , Disp: , Rfl:  .  aspirin EC 81 MG tablet, Take 81 mg by mouth daily., Disp: , Rfl:  .  AURYXIA 1 GM 210 MG(Fe) tablet, , Disp: , Rfl:  .  calcium acetate (PHOSLO) 667 MG capsule, Take 1 capsule (667 mg total) by mouth 3 (three) times daily with meals., Disp: 90 capsule, Rfl: 0 .  carvedilol (COREG) 6.25 MG tablet, TAKE 1 TABLET BY MOUTH TWICE DAILY WITH A MEAL, Disp: 180 tablet, Rfl: 1 .  Cholecalciferol (VITAMIN D) 2000 units tablet, Take 2,000 Units by mouth daily., Disp: , Rfl:  .  Continuous Blood Gluc Receiver (DEXCOM G5  RECEIVER KIT) DEVI, 1 kit by Does not apply route as directed., Disp: 1 Device, Rfl: 0 .  glucose blood test strip, USE 1 STRIP TO CHECK GLUCOSE THREE TIMES DAILY, Disp: 300 each, Rfl: 3 .  hydrALAZINE (APRESOLINE) 50 MG tablet, Take 1 tablet (50 mg total) by mouth 3 (three) times daily., Disp: 90 tablet, Rfl: 2 .  insulin degludec (TRESIBA FLEXTOUCH) 100 UNIT/ML FlexTouch Pen, INJECT 60 UNITS SUB-Q ONCE DAILY, Disp: 45 mL, Rfl: 3 .  lidocaine-prilocaine (EMLA) cream, APPLY SMALL AMOUNT TO ACCESS SITE (AVF) 1 TO 2 HOURS BEFORE DIALYSIS. COVER WITH OCCLUSIVE DRESSING (SARAN WRAP), Disp: , Rfl: 12 .  montelukast (SINGULAIR) 10 MG tablet, Take 10 mg by mouth at bedtime. , Disp: , Rfl:  .  NOVOLOG FLEXPEN 100 UNIT/ML FlexPen, INJECT  2 TO 25 UNITS SUBCUTANEOUSLY 3 TIMES A DAY WITH MEALS PER SLIDING SCALE, Disp: 45 mL, Rfl: 3 .  Oxycodone HCl 10 MG TABS, Take 10 mg by mouth 4 (four) times daily as needed (for pain). , Disp: , Rfl:  .  simvastatin (ZOCOR) 40 MG tablet, TAKE 1 TABLET BY MOUTH AT BEDTIME, Disp: 90 tablet, Rfl: 0 .  sulfamethoxazole-trimethoprim (BACTRIM DS) 800-160 MG tablet, Take 1 tablet by mouth daily for 7 days., Disp: 7 tablet, Rfl: 0 .  tamsulosin (FLOMAX) 0.4 MG CAPS capsule, Take 1 capsule (0.4 mg total) by mouth daily., Disp: 30 capsule, Rfl: 0 .  colchicine 0.6 MG tablet, TAKE 2 TABLETS BY MOUTH AS SOON AS GOUT SYMPTOMS START (Patient not taking: No sig reported), Disp: 30 tablet, Rfl: 0 .  isosorbide mononitrate (IMDUR) 60 MG 24 hr tablet, Take 1 tablet by mouth once daily, Disp: 90 tablet, Rfl: 0  EXAM:  VITALS per patient if applicable:Ht $RemoveBeforeDE'5\' 8"'[id number]$  (1.727 m)   BMI 44.15 kg/m   GENERAL: alert, oriented, appears well and in no acute distress  HEENT: atraumatic, conjunctiva clear, no obvious abnormalities on inspection.  NECK: normal movements of the head and neck  LUNGS: on inspection no signs of respiratory distress, breathing rate appears normal, no obvious gross SOB,  gasping or wheezing  CV: no obvious cyanosis  MS: moves all visible extremities without noticeable abnormality  PSYCH/NEURO: pleasant and cooperative, no obvious depression or anxiety, speech and thought processing grossly intact  ASSESSMENT AND PLAN:  Discussed the following assessment and plan:  Type 2 diabetes mellitus with chronic kidney disease on chronic dialysis, without long-term current use of insulin (HCC) HgA1C at goal. Continue Tresiba 60 U daily and Novolin per sliding scale. Caution with hypoglycemic events. HgA1C is being checked q 3 months at the dialysis center.  Annual eye exam and foot care recommended. F/U in 5-6 months  Dysuria - Plan: sulfamethoxazole-trimethoprim (BACTRIM DS) 800-160 MG tablet Mild. Possible etiologies discussed.  Empiric abx treatment. He is seeing urologist. Instructed about warning signs.  COVID-19 virus infection Resolved.  Essential hypertension BP has been adequately controlled. Continue Hydralazine 50 mg tid and Carvedilol 6.25 mg bid.  Renal cyst, left Renal MRI on 09/12/20. Abdominal CT in 01/2020: 1. Stable 5.2 cm exophytic lesion is seen arising from lower pole of left kidney most consistent with benign hyperdense cyst.  2. As noted on prior MRI, there is a probable large soft tissue mass within the left intrarenal collecting system and pelvis which is enlarged compared to prior exam of 2019, and is concerning for urothelial neoplasm or malignancy.   3. Minimal cholelithiasis.   Pending appt with urologist.  Aortic Atherosclerosis (ICD10-I70.0).  Seen on Abdominal CT. On Simvastatin and Aspirin.  I discussed the assessment and treatment plan with the patient. Mr. Mestas was provided an opportunity to ask questions and all were answered. He agreed with the plan and demonstrated an understanding of the instructions.   Return in about 6 months (around 03/07/2021) for DM II,HTN.    Kayn Haymore Martinique, MD

## 2020-09-12 ENCOUNTER — Other Ambulatory Visit: Payer: Self-pay

## 2020-09-12 ENCOUNTER — Ambulatory Visit
Admission: RE | Admit: 2020-09-12 | Discharge: 2020-09-12 | Disposition: A | Discharge status: Home / Self Care | Payer: Medicare Other | Source: Ambulatory Visit | Attending: Urology | Admitting: Urology

## 2020-09-12 DIAGNOSIS — D3002 Benign neoplasm of left kidney: Secondary | ICD-10-CM

## 2020-09-17 NOTE — Progress Notes (Signed)
Patient scheduled for 03/07/2021 at 11:30 AM

## 2020-09-18 ENCOUNTER — Other Ambulatory Visit: Payer: Self-pay | Admitting: Family Medicine

## 2020-09-18 DIAGNOSIS — E785 Hyperlipidemia, unspecified: Secondary | ICD-10-CM

## 2020-10-11 ENCOUNTER — Telehealth (INDEPENDENT_AMBULATORY_CARE_PROVIDER_SITE_OTHER): Payer: Medicare Other | Admitting: Family Medicine

## 2020-10-11 ENCOUNTER — Encounter: Payer: Self-pay | Admitting: Family Medicine

## 2020-10-11 VITALS — Ht 68.0 in

## 2020-10-11 DIAGNOSIS — E785 Hyperlipidemia, unspecified: Secondary | ICD-10-CM

## 2020-10-11 DIAGNOSIS — I119 Hypertensive heart disease without heart failure: Secondary | ICD-10-CM

## 2020-10-11 DIAGNOSIS — I25110 Atherosclerotic heart disease of native coronary artery with unstable angina pectoris: Secondary | ICD-10-CM | POA: Diagnosis not present

## 2020-10-11 DIAGNOSIS — N186 End stage renal disease: Secondary | ICD-10-CM

## 2020-10-11 DIAGNOSIS — Z992 Dependence on renal dialysis: Secondary | ICD-10-CM

## 2020-10-11 DIAGNOSIS — E1122 Type 2 diabetes mellitus with diabetic chronic kidney disease: Secondary | ICD-10-CM

## 2020-10-11 DIAGNOSIS — G4700 Insomnia, unspecified: Secondary | ICD-10-CM

## 2020-10-11 NOTE — Progress Notes (Addendum)
Virtual Visit via Video Note I connected with Kent Osborne on 10/11/2020 by a video enabled telemedicine application and verified that I am speaking with the correct person using two identifiers.  Location patient: home Location provider:work office Persons participating in the virtual visit: patient, provider  I discussed the limitations of evaluation and management by telemedicine and the availability of in person appointments. The patient expressed understanding and agreed to proceed.  Chief Complaint  Patient presents with  . Hospitalization Follow-up   HPI: Kent Osborne is a 70 year old male with history of asthma, ESRD on dialysis, DM 2, CAD, hypertension following on recent hospitalization. 4 days hospitalization. He was admitted from 10/05/2020 to 10/08/2020 because of CP. He was discharged home. No changes in ADL's and IADL's.  2 business days since hospital discharge. No new symptoms. He was supposed to follow-up with his cardiologist today but appointment was canceled because positive COVID-19 test (10/08/20), he is asymptomatic. He had COVID 19 infection in 06/2020, he was hospitalized at that time.  He is feeling "ok." He has been resting, occasionally he has "minor" chest discomfort. S/P stenting in 2009.  He has been having "little" CP intermittently for about a year, usually Nitroglycerin SL helped but the day of admission he took nitro x 5 and pain did not improve.  I do not have access to labs done during hospitalization but he reports troponin elevation in the 100's.  Planning on having coronary artery bypass on 10/25/20, will be admitted the day before.  Following with cardiothoracic surgeon, Dr. Ruby Cola.  Negative for associated palpitations, diaphoresis, or dyspnea. Negative for unusual headache, abdominal pain, nausea, vomiting, changes in bowel habits, or worsening edema.  Some of his medication or change. He is no longer on carvedilol. Metoprolol succinate  50 mg daily was added. Hydroxyzine 50 mg was discontinued. He is still on Imdur 60 mg daily.  CHF: Echo in 10/2016 with LVEF 50 to 55%. He has not checked BP but usually checked during dialysis.  He is on aspirin 81 mg daily.  GYJ:EHUDJSHFWYO 40 mg was discontinued. Crestor 40 mg was started.  Lab Results  Component Value Date   CHOL 98 08/10/2017   HDL 25.50 (L) 08/10/2017   LDLCALC 33 08/10/2017   LDLDIRECT 80.7 05/05/2010   TRIG 196.0 (H) 08/10/2017   CHOLHDL 4 08/10/2017   ESRD: Following with nephrologist. Dialysis 3 times per week.  He takes amitriptyline 25 mg daily at bedtime for sleep, this medication is prescribed by his pain management.  DM II: He is on NovoLog per sliding scale and Tresiba 60 units daily. HgA1C has been checked with dialysis labs q 3 months. Last HgA1C in the low 6.0.  ROS: See pertinent positives and negatives per HPI.  Past Medical History:  Diagnosis Date  . Acute renal failure superimposed on chronic kidney disease ([place])    [name] 11/01/2016  . Anemia   . Arthritis    "back, knees" (11/01/2016)  . Asthma   . CAD (coronary artery disease) 12/31/07   [facility] stents- placed in the circumflex and LAD  . Chronic bronchitis ([place])   . Hemodialysis status ([place])   . History of blood transfusion 10/30/2016   "related to anemia"  . History of gout   . History of kidney stones   . Hyperlipidemia   . Hypertension   . NSTEMI (non-ST elevated myocardial infarction) ([place]) 10/30/2016  . OA (osteoarthritis)   . Obese   . On home oxygen therapy    "  2L w/CPAP" (11/01/2016)  . OSA on CPAP   . Psoriatic arthritis ([place])   . Type II diabetes mellitus ([place])     Past Surgical History:  Procedure Laterality Date  . AV FISTULA PLACEMENT    . CORONARY ANGIOPLASTY WITH STENT PLACEMENT  2009   "3 stents"  . CYSTOSCOPY WITH URETEROSCOPY AND STENT PLACEMENT Bilateral 04/23/2018   Procedure: CYSTOSCOPY WITH BLADDER  BIOPSY BILATERAL RETROGRADE  PYELOGRAM LEFT URETEROSCOPY WITH BIOPSY AND LEFT URETERAL  STENT PLACEMENT;  Surgeon: Festus Aloe, MD;  Location: [facility];  Service: Urology;  Laterality: Bilateral;    Family History  Problem Relation Age of Onset  . Diabetes Other   . Hyperlipidemia Other   . Hypertension Other     Social History   Socioeconomic History  . Marital status: Divorced    Spouse name: Not on file  . Number of children: 3  . Years of education: 56  . Highest education level: High school graduate  Occupational History  . Not on file  Tobacco Use  . Smoking status: Never Smoker  . Smokeless tobacco: Never Used  Vaping Use  . Vaping Use: Never used  Substance and Sexual Activity  . Alcohol use: Not Currently    Comment: 11/01/2016 "nothing in years"  . Drug use: No  . Sexual activity: Yes  Other Topics Concern  . Not on file  Social History Narrative   Divorced   1 son and 2 daughters   2 grandsons   Retired Conservation officer, nature   Social Determinants of Radio broadcast assistant Strain: [place]   . Difficulty of Paying Living Expenses: Not hard at all  Food Insecurity: No Food Insecurity  . Worried About Charity fundraiser in the Last Year: Never true  . Ran Out of Food in the Last Year: Never true  Transportation Needs: No Transportation Needs  . Lack of Transportation (Medical): No  . Lack of Transportation (Non-Medical): No  Physical Activity: Inactive  . Days of Exercise per Week: 0 days  . Minutes of Exercise per Session: 0 min  Stress: No Stress Concern Present  . Feeling of Stress : Not at all  Social Connections: Moderately Integrated  . Frequency of Communication with Friends and Family: More than three times a week  . Frequency of Social Gatherings with Friends and Family: More than three times a week  . Attends Religious Services: More than 4 times per year  . Active Member of Clubs or Organizations: Yes  . Attends Archivist Meetings: Never  . Marital Status:  Divorced  Human resources officer Violence: Not At Risk  . Fear of Current or Ex-Partner: No  . Emotionally Abused: No  . Physically Abused: No  . Sexually Abused: No      Current Outpatient Medications:  .  amitriptyline (ELAVIL) 25 MG tablet, Take 25 mg by mouth at bedtime. , Disp: , Rfl:  .  aspirin EC 81 MG tablet, Take 81 mg by mouth daily., Disp: , Rfl:  .  AURYXIA 1 GM 210 MG(Fe) tablet, , Disp: , Rfl:  .  calcium acetate (PHOSLO) 667 MG capsule, Take 1 capsule (667 mg total) by mouth 3 (three) times daily with meals., Disp: 90 capsule, Rfl: 0 .  Cholecalciferol (VITAMIN D) 2000 units tablet, Take 2,000 Units by mouth daily., Disp: , Rfl:  .  colchicine 0.6 MG tablet, TAKE 2 TABLETS BY MOUTH AS SOON AS GOUT SYMPTOMS START, Disp: 30 tablet, Rfl: 0 .  Continuous Blood Gluc Receiver (DEXCOM G5 RECEIVER KIT) DEVI, 1 kit by Does not apply route as directed., Disp: 1 Device, Rfl: 0 .  glucose blood test strip, USE 1 STRIP TO CHECK GLUCOSE THREE TIMES DAILY, Disp: 300 each, Rfl: 3 .  insulin degludec (TRESIBA FLEXTOUCH) 100 UNIT/ML FlexTouch Pen, INJECT 60 UNITS SUB-Q ONCE DAILY, Disp: 45 mL, Rfl: 3 .  isosorbide mononitrate (IMDUR) 60 MG 24 hr tablet, Take 1 tablet by mouth once daily, Disp: 90 tablet, Rfl: 0 .  lidocaine-prilocaine (EMLA) cream, APPLY SMALL AMOUNT TO ACCESS SITE (AVF) 1 TO 2 HOURS BEFORE DIALYSIS. COVER WITH OCCLUSIVE DRESSING (SARAN WRAP), Disp: , Rfl: 12 .  metoprolol succinate (TOPROL-XL) 50 MG 24 hr tablet, Take 50 mg by mouth daily. Take with or immediately following a meal., Disp: , Rfl:  .  montelukast (SINGULAIR) 10 MG tablet, Take 10 mg by mouth at bedtime. , Disp: , Rfl:  .  NOVOLOG FLEXPEN 100 UNIT/ML FlexPen, INJECT 2 TO 25 UNITS SUBCUTANEOUSLY 3 TIMES A DAY WITH MEALS PER SLIDING SCALE, Disp: 45 mL, Rfl: 3 .  Oxycodone HCl 10 MG TABS, Take 10 mg by mouth 4 (four) times daily as needed (for pain). , Disp: , Rfl:  .  rosuvastatin (CRESTOR) 40 MG tablet, Take 40 mg by  mouth daily., Disp: , Rfl:  .  tamsulosin (FLOMAX) 0.4 MG CAPS capsule, Take 1 capsule (0.4 mg total) by mouth daily., Disp: 30 capsule, Rfl: 0 .  VENTOLIN HFA 108 (90 Base) MCG/ACT inhaler, , Disp: , Rfl:   EXAM:  VITALS per patient if applicable:Ht <[id number]>_0<\/BEMLJQGBEEFEOFHQ>_1  (1.727 m)   BMI 44.15 kg/m   GENERAL: alert, oriented, appears well and in no acute distress  HEENT: atraumatic, conjunctiva clear, no obvious abnormalities on inspection.  NECK: normal movements of the head and neck  LUNGS: on inspection no signs of respiratory distress, breathing rate appears normal, no obvious gross SOB, gasping or wheezing  CV: no obvious cyanosis  MS: moves all visible extremities without noticeable abnormality  PSYCH/NEURO: pleasant and cooperative, no obvious depression or anxiety, speech and thought processing grossly intact  ASSESSMENT AND PLAN:  Discussed the following assessment and plan:  Coronary artery disease involving native coronary artery of native heart with unstable angina pectoris (HCC) Symptomatic. Undergoing CABG on 10/25/20. Currently on metoprolol succinate, Imdur, Crestor, and Aspirin. Instructed about warning signs.  Type 2 diabetes mellitus with chronic kidney disease on chronic dialysis, without long-term current use of insulin ([place]) Problem has been adequately controlled. Continue Tresiba and NovoLog at same doses.  Hyperlipidemia, unspecified hyperlipidemia type Continue Crestor 40 mg daily and low-fat diet. LDL goal < 70.  Hypertension with heart disease BP has been adequately controlled. Continue metoprolol succinate 50 mg daily, Imdur 60 mg daily. Low-salt diet. BP is monitor regularly during dialysis.  Insomnia, unspecified type Continue amitriptyline 25 mg daily, which is being prescribed by pain management.  Most likely prescribed initially to treat peripheral neuropathy.  We discussed possible serious and likely etiologies, options for evaluation and workup,he  understands limitations of telemedicine visit vs in person visit, treatment, treatment risks and precautions.  I discussed the assessment and treatment plan with the patient.  Mr. Gertz was provided an opportunity to ask questions and all were answered.  He agreed with the plan and demonstrated an understanding of the instructions.   Return if symptoms worsen or fail to improve, for Keep next f/u appt..  Betty Martinique, MD

## 2020-10-16 ENCOUNTER — Other Ambulatory Visit: Payer: Self-pay | Admitting: Family Medicine

## 2020-10-16 DIAGNOSIS — E1122 Type 2 diabetes mellitus with diabetic chronic kidney disease: Secondary | ICD-10-CM

## 2020-10-16 DIAGNOSIS — N186 End stage renal disease: Secondary | ICD-10-CM

## 2020-12-19 DEATH — deceased

## 2021-03-07 ENCOUNTER — Ambulatory Visit: Payer: Medicare Other | Admitting: Family Medicine

## 2021-08-15 IMAGING — MR MR ABDOMEN W/O CM
5 series · 38 of 48 positions shown · non-contrast
Comparison: 02/13/2019

CLINICAL DATA: Left renal lesion on previous imaging. Reported
history in [REDACTED] of Benign tumor of kidney, left I7L.LV (D95-MA-CM)

EXAM:
MRI ABDOMEN WITHOUT CONTRAST
TECHNIQUE: Multiplanar multisequence MR imaging was performed without the
administration of intravenous contrast.

[Series 2: T2 · coronal · 5.0mm · 1.56mm/px · 7 of 31 slices shown (1 of 3)]
[im 1/31]
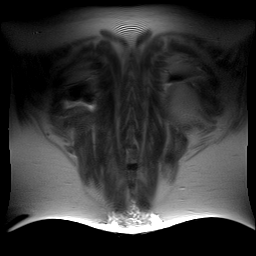
[im 6/31]
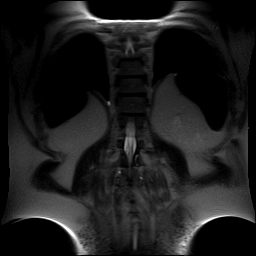
[im 11/31]
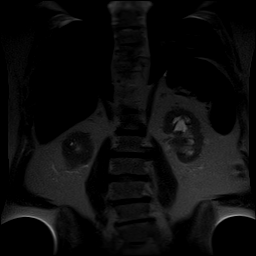
[im 16/31]
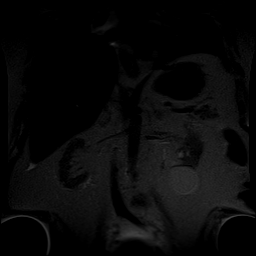
[im 21/31]
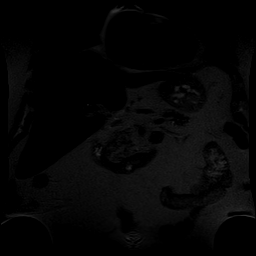
[im 26/31]
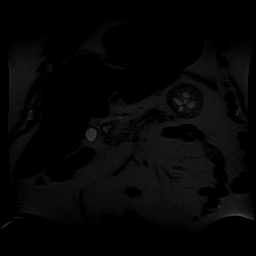
[im 31/31]
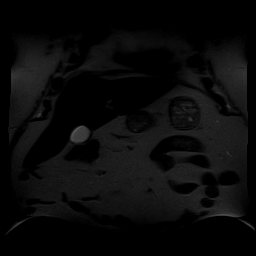

[Series 3: T2 · axial · 5.0mm · 1.56mm/px · z∈[-145,+99]mm · 8 of 40 slices shown (2 of 3)]
[im 1/40]
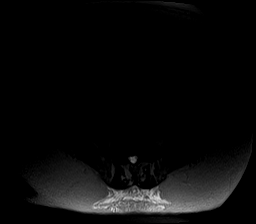
[im 6/40]
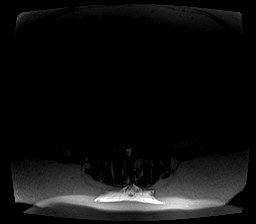
[im 12/40]
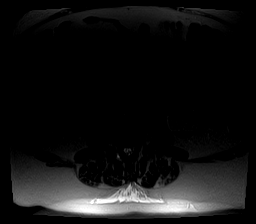
[im 17/40]
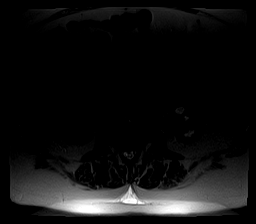
[im 23/40]
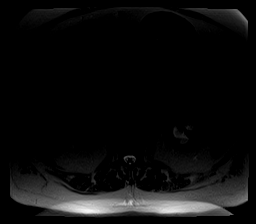
[im 28/40]
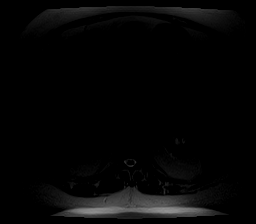
[im 34/40]
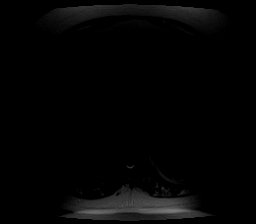
[im 40/40]
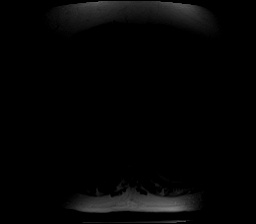

[Series 4: axial in out · axial · 5.5mm · 0.78mm/px · z∈[-134,+100]mm · 10 of 80 slices shown]
[im 6/80]
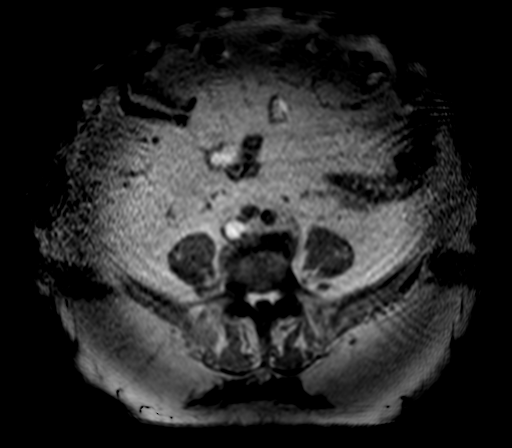
[im 11/80]
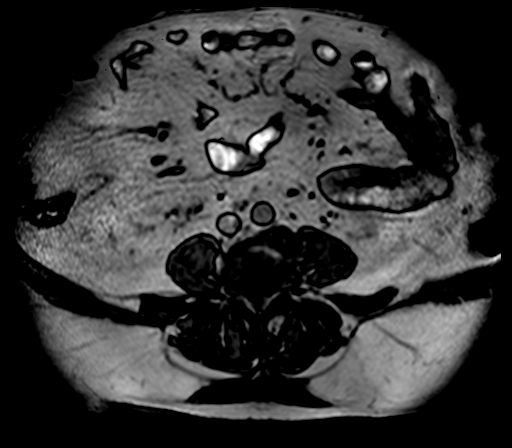
[im 16/80]
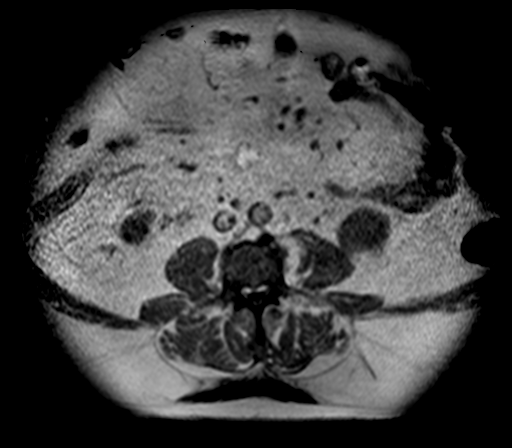
[im 27/80]
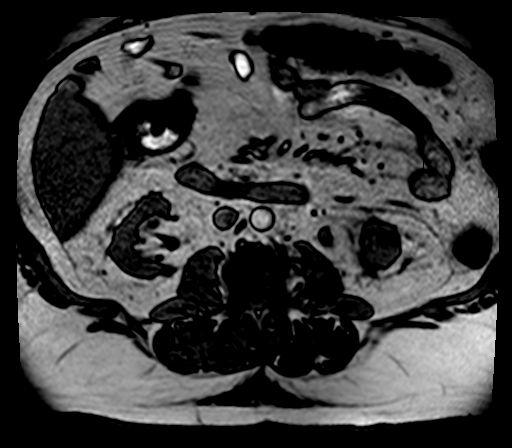
[im 37/80]
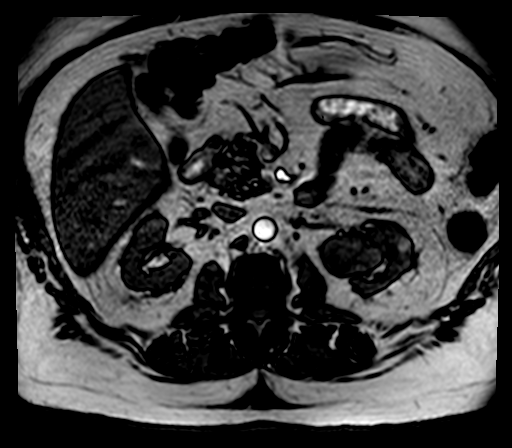
[im 43/80]
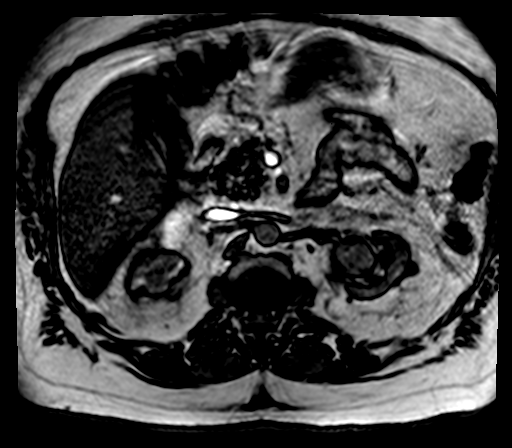
[im 48/80]
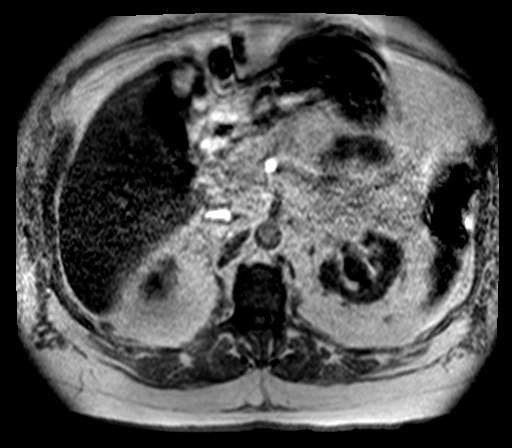
[im 58/80]
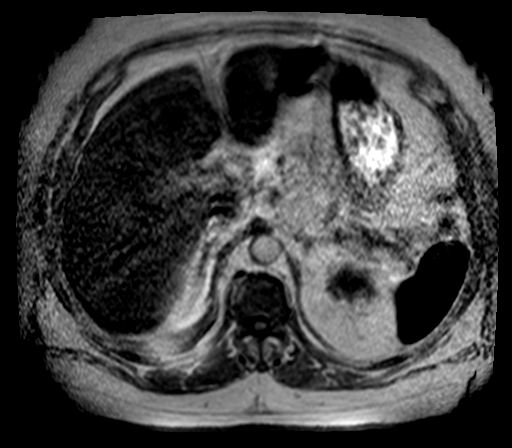
[im 69/80]
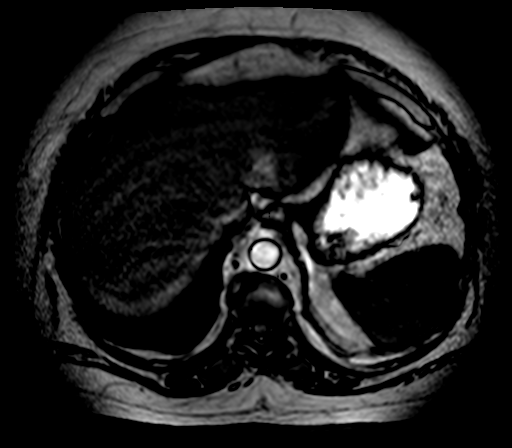
[im 80/80]
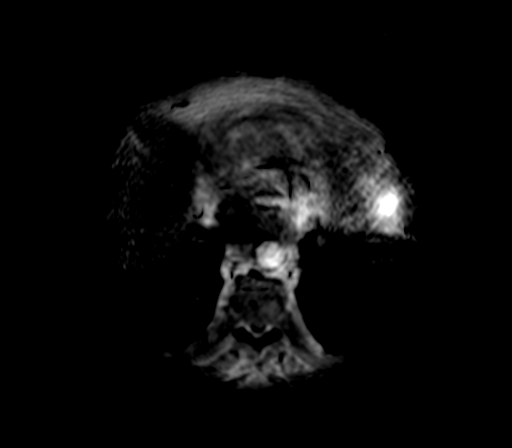

[Series 5: axial tru fisp · axial · 5.0mm · 1.56mm/px · z∈[-141,+95]mm · 9 of 42 slices shown]
[im 1/42]
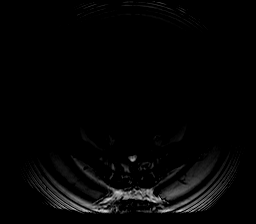
[im 6/42]
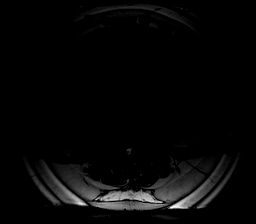
[im 11/42]
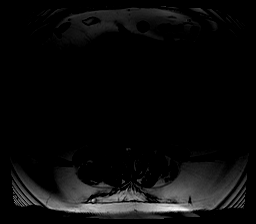
[im 16/42]
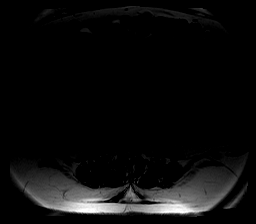
[im 21/42]
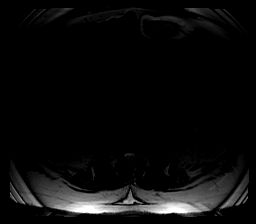
[im 26/42]
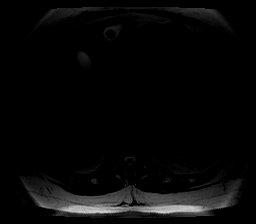
[im 31/42]
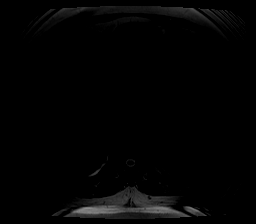
[im 36/42]
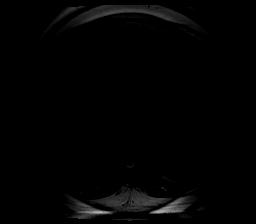
[im 42/42]
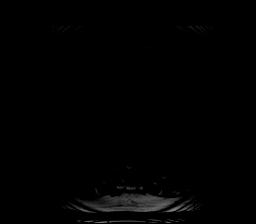

[Series 6: T2 · axial · 5.0mm · 0.78mm/px · z∈[-108,-8]mm · 4 of 40 slices shown (3 of 3)]
[im 1/40]
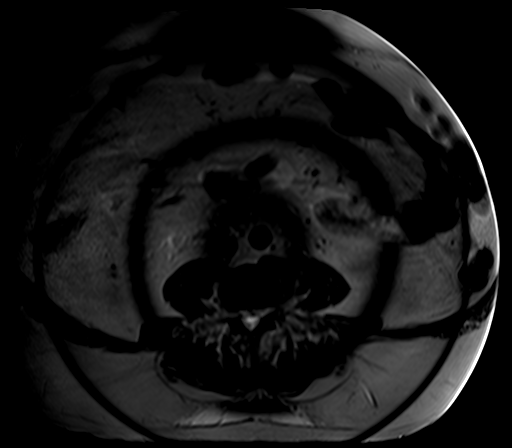
[im 6/40]
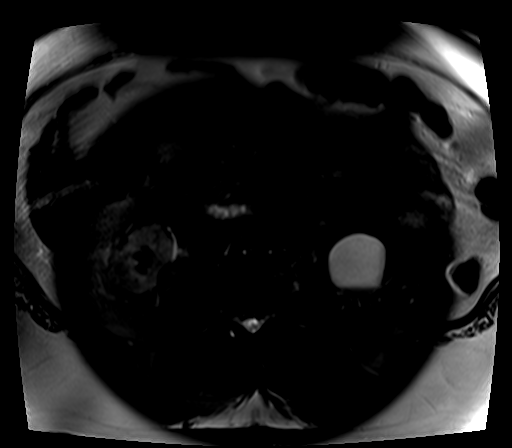
[im 12/40]
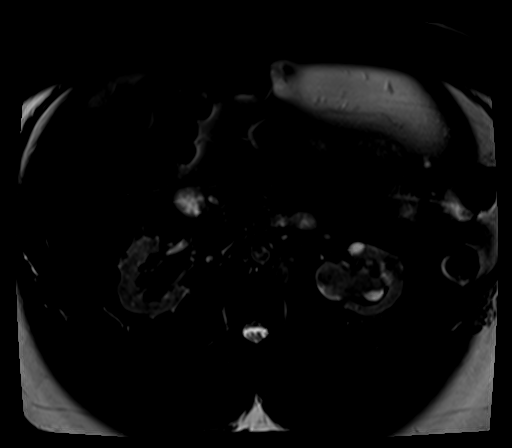
[im 17/40]
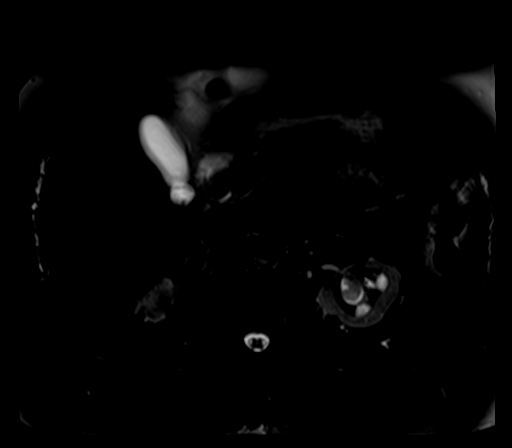

[38 of 48 positions shown; findings below may reference images not displayed]

FINDINGS: Limited study due to lack of intravenous contrast material (renal
insufficiency) and patient unable to complete the entire study due
to claustrophobia.

Lower chest: Unremarkable.

Hepatobiliary: No focal abnormality identified within the liver
parenchyma on this limited study. There is no evidence for
gallstones, gallbladder wall thickening, or pericholecystic fluid.
No intrahepatic or extrahepatic biliary dilation.

Pancreas: No focal mass lesion. No dilatation of the main duct. No
intraparenchymal cyst. No peripancreatic edema.

Spleen:  No splenomegaly. No focal mass lesion.

Adrenals/Urinary Tract:  No adrenal nodule or mass.

Right kidney unremarkable on this noncontrast study.

Mild left hydronephrosis again noted with filling defect in the left
intrarenal collecting system and renal pelvis again noted. This is
progressive in the interval with renal pelvis demonstrating 3.2 cm
AP diameter today compared to 2.6 cm previously. 5.1 cm exophytic
cyst noted lower pole left kidney with layering debris, similar to
prior.No evidence for left hydroureter.

Stomach/Bowel: Stomach is unremarkable. No gastric wall thickening.
No evidence of outlet obstruction. Duodenum is normally positioned
as is the ligament of Treitz. No small bowel or colonic dilatation
within the visualized abdomen.

Vascular/Lymphatic: No abdominal aortic aneurysm. No abdominal
lymphadenopathy.

Other:  No intraperitoneal free fluid.

Musculoskeletal: No suspicious marrow signal abnormality within the
visualized bony anatomy.
IMPRESSION: 1. Limited study due to patient inability to complete exam secondary
to claustrophobia.
2. Interval progression of the soft tissue or other material filling
the left intrarenal collecting system and left renal pelvis. As
above, AP diameter of the left renal pelvis has increased from
cm previously to 3.2 cm today. MR imaging features are certainly
concerning for urothelial neoplasm. By report, the patient underwent
ureteroscopy on 04/23/2018.
3. Similar appearance of exophytic cystic lesion lower pole left
kidney with layering debris.

## 2021-08-30 ENCOUNTER — Ambulatory Visit: Payer: Self-pay

## 2021-08-30 ENCOUNTER — Telehealth: Payer: Self-pay

## 2021-08-30 NOTE — Telephone Encounter (Signed)
Unsuccessful attempt to reach patient for scheduled AWV. On preferred number listed in notes. Unable to leave voicemail. Number listed not in service.
